# Patient Record
Sex: Female | Born: 1945
Health system: Southern US, Community
[De-identification: ages and names within clinical notes are randomized; demographics above are authoritative.]

## PROBLEM LIST (undated history)

## (undated) DIAGNOSIS — R609 Edema, unspecified: Secondary | ICD-10-CM

## (undated) DIAGNOSIS — M359 Systemic involvement of connective tissue, unspecified: Secondary | ICD-10-CM

## (undated) DIAGNOSIS — F32A Depression, unspecified: Secondary | ICD-10-CM

## (undated) DIAGNOSIS — C801 Malignant (primary) neoplasm, unspecified: Secondary | ICD-10-CM

## (undated) DIAGNOSIS — F419 Anxiety disorder, unspecified: Secondary | ICD-10-CM

## (undated) DIAGNOSIS — T8859XA Other complications of anesthesia, initial encounter: Secondary | ICD-10-CM

## (undated) DIAGNOSIS — Z87442 Personal history of urinary calculi: Secondary | ICD-10-CM

## (undated) DIAGNOSIS — J439 Emphysema, unspecified: Secondary | ICD-10-CM

## (undated) DIAGNOSIS — N63 Unspecified lump in unspecified breast: Secondary | ICD-10-CM

## (undated) DIAGNOSIS — T4145XA Adverse effect of unspecified anesthetic, initial encounter: Secondary | ICD-10-CM

## (undated) DIAGNOSIS — R002 Palpitations: Secondary | ICD-10-CM

## (undated) DIAGNOSIS — M069 Rheumatoid arthritis, unspecified: Secondary | ICD-10-CM

## (undated) DIAGNOSIS — Z9889 Other specified postprocedural states: Secondary | ICD-10-CM

## (undated) DIAGNOSIS — K219 Gastro-esophageal reflux disease without esophagitis: Secondary | ICD-10-CM

## (undated) DIAGNOSIS — M1990 Unspecified osteoarthritis, unspecified site: Secondary | ICD-10-CM

## (undated) DIAGNOSIS — E039 Hypothyroidism, unspecified: Secondary | ICD-10-CM

## (undated) DIAGNOSIS — M797 Fibromyalgia: Secondary | ICD-10-CM

## (undated) DIAGNOSIS — F329 Major depressive disorder, single episode, unspecified: Secondary | ICD-10-CM

## (undated) DIAGNOSIS — IMO0002 Reserved for concepts with insufficient information to code with codable children: Secondary | ICD-10-CM

## (undated) DIAGNOSIS — Z8719 Personal history of other diseases of the digestive system: Secondary | ICD-10-CM

## (undated) DIAGNOSIS — R112 Nausea with vomiting, unspecified: Secondary | ICD-10-CM

## (undated) DIAGNOSIS — H269 Unspecified cataract: Secondary | ICD-10-CM

## (undated) DIAGNOSIS — Z9221 Personal history of antineoplastic chemotherapy: Secondary | ICD-10-CM

## (undated) DIAGNOSIS — C50919 Malignant neoplasm of unspecified site of unspecified female breast: Secondary | ICD-10-CM

## (undated) DIAGNOSIS — H3530 Unspecified macular degeneration: Secondary | ICD-10-CM

## (undated) DIAGNOSIS — R0600 Dyspnea, unspecified: Secondary | ICD-10-CM

## (undated) DIAGNOSIS — H9190 Unspecified hearing loss, unspecified ear: Secondary | ICD-10-CM

## (undated) HISTORY — DX: Rheumatoid arthritis, unspecified: M06.9

## (undated) HISTORY — DX: Reserved for concepts with insufficient information to code with codable children: IMO0002

## (undated) HISTORY — DX: Depression, unspecified: F32.A

## (undated) HISTORY — DX: Hypothyroidism, unspecified: E03.9

## (undated) HISTORY — DX: Unspecified macular degeneration: H35.30

## (undated) HISTORY — DX: Malignant (primary) neoplasm, unspecified: C80.1

## (undated) HISTORY — DX: Unspecified osteoarthritis, unspecified site: M19.90

## (undated) HISTORY — PX: HIP ARTHROPLASTY: SHX981

## (undated) HISTORY — DX: Unspecified cataract: H26.9

## (undated) HISTORY — DX: Major depressive disorder, single episode, unspecified: F32.9

---

## 1968-10-19 HISTORY — PX: TONSILLECTOMY: SUR1361

## 1995-10-20 HISTORY — PX: LITHOTRIPSY: SUR834

## 1998-02-28 ENCOUNTER — Other Ambulatory Visit: Admission: RE | Admit: 1998-02-28 | Discharge: 1998-02-28 | Payer: Self-pay | Admitting: *Deleted

## 1998-03-04 ENCOUNTER — Other Ambulatory Visit: Admission: RE | Admit: 1998-03-04 | Discharge: 1998-03-04 | Payer: Self-pay | Admitting: Urology

## 1998-04-18 ENCOUNTER — Other Ambulatory Visit: Admission: RE | Admit: 1998-04-18 | Discharge: 1998-04-18 | Payer: Self-pay | Admitting: Urology

## 1999-10-10 ENCOUNTER — Encounter: Admission: RE | Admit: 1999-10-10 | Discharge: 1999-10-10 | Payer: Self-pay | Admitting: *Deleted

## 2000-06-18 ENCOUNTER — Other Ambulatory Visit: Admission: RE | Admit: 2000-06-18 | Discharge: 2000-06-18 | Payer: Self-pay | Admitting: *Deleted

## 2001-10-19 HISTORY — PX: JOINT REPLACEMENT: SHX530

## 2002-01-23 ENCOUNTER — Other Ambulatory Visit: Admission: RE | Admit: 2002-01-23 | Discharge: 2002-01-23 | Payer: Self-pay | Admitting: Family Medicine

## 2002-06-26 ENCOUNTER — Encounter: Payer: Self-pay | Admitting: Orthopedic Surgery

## 2002-06-26 ENCOUNTER — Inpatient Hospital Stay (HOSPITAL_COMMUNITY): Admission: RE | Admit: 2002-06-26 | Discharge: 2002-06-29 | Payer: Self-pay | Admitting: Orthopedic Surgery

## 2003-07-06 ENCOUNTER — Other Ambulatory Visit: Admission: RE | Admit: 2003-07-06 | Discharge: 2003-07-06 | Payer: Self-pay | Admitting: Family Medicine

## 2005-06-04 ENCOUNTER — Ambulatory Visit: Payer: Self-pay | Admitting: Family Medicine

## 2006-04-08 ENCOUNTER — Ambulatory Visit (HOSPITAL_COMMUNITY): Admission: RE | Admit: 2006-04-08 | Discharge: 2006-04-08 | Payer: Self-pay | Admitting: Rheumatology

## 2006-07-21 ENCOUNTER — Ambulatory Visit: Payer: Self-pay | Admitting: Internal Medicine

## 2006-11-03 ENCOUNTER — Encounter: Payer: Self-pay | Admitting: Family Medicine

## 2006-11-03 ENCOUNTER — Other Ambulatory Visit: Admission: RE | Admit: 2006-11-03 | Discharge: 2006-11-03 | Payer: Self-pay | Admitting: Family Medicine

## 2006-11-03 ENCOUNTER — Ambulatory Visit: Payer: Self-pay | Admitting: Family Medicine

## 2006-11-03 LAB — CONVERTED CEMR LAB: Pap Smear: NORMAL

## 2007-02-16 ENCOUNTER — Encounter: Payer: Self-pay | Admitting: Family Medicine

## 2007-02-16 LAB — CONVERTED CEMR LAB: TSH: 0.435 microintl units/mL

## 2007-02-21 ENCOUNTER — Encounter: Payer: Self-pay | Admitting: Family Medicine

## 2007-03-21 ENCOUNTER — Ambulatory Visit (HOSPITAL_COMMUNITY): Admission: RE | Admit: 2007-03-21 | Discharge: 2007-03-21 | Payer: Self-pay | Admitting: Urology

## 2007-12-28 ENCOUNTER — Encounter: Payer: Self-pay | Admitting: Family Medicine

## 2007-12-28 DIAGNOSIS — H9190 Unspecified hearing loss, unspecified ear: Secondary | ICD-10-CM | POA: Insufficient documentation

## 2007-12-28 DIAGNOSIS — M810 Age-related osteoporosis without current pathological fracture: Secondary | ICD-10-CM

## 2007-12-28 DIAGNOSIS — M797 Fibromyalgia: Secondary | ICD-10-CM

## 2007-12-28 DIAGNOSIS — M0579 Rheumatoid arthritis with rheumatoid factor of multiple sites without organ or systems involvement: Secondary | ICD-10-CM

## 2007-12-28 DIAGNOSIS — M5136 Other intervertebral disc degeneration, lumbar region: Secondary | ICD-10-CM | POA: Insufficient documentation

## 2007-12-28 DIAGNOSIS — E039 Hypothyroidism, unspecified: Secondary | ICD-10-CM

## 2007-12-28 DIAGNOSIS — Z87898 Personal history of other specified conditions: Secondary | ICD-10-CM | POA: Insufficient documentation

## 2007-12-28 DIAGNOSIS — E559 Vitamin D deficiency, unspecified: Secondary | ICD-10-CM

## 2007-12-29 ENCOUNTER — Encounter: Payer: Self-pay | Admitting: Family Medicine

## 2007-12-29 ENCOUNTER — Ambulatory Visit: Payer: Self-pay | Admitting: Family Medicine

## 2007-12-29 DIAGNOSIS — M4800 Spinal stenosis, site unspecified: Secondary | ICD-10-CM | POA: Insufficient documentation

## 2008-01-24 ENCOUNTER — Encounter: Payer: Self-pay | Admitting: Family Medicine

## 2008-02-06 ENCOUNTER — Telehealth: Payer: Self-pay | Admitting: Family Medicine

## 2008-03-13 ENCOUNTER — Encounter: Payer: Self-pay | Admitting: Family Medicine

## 2008-04-26 ENCOUNTER — Telehealth: Payer: Self-pay | Admitting: Family Medicine

## 2008-07-18 ENCOUNTER — Encounter: Payer: Self-pay | Admitting: Family Medicine

## 2008-08-31 ENCOUNTER — Encounter: Payer: Self-pay | Admitting: Family Medicine

## 2008-09-11 ENCOUNTER — Telehealth: Payer: Self-pay | Admitting: Family Medicine

## 2008-10-24 ENCOUNTER — Ambulatory Visit: Payer: Self-pay | Admitting: Family Medicine

## 2008-10-28 DIAGNOSIS — F341 Dysthymic disorder: Secondary | ICD-10-CM

## 2008-11-28 ENCOUNTER — Encounter: Payer: Self-pay | Admitting: Family Medicine

## 2008-12-12 ENCOUNTER — Encounter: Payer: Self-pay | Admitting: Family Medicine

## 2008-12-18 ENCOUNTER — Telehealth: Payer: Self-pay | Admitting: Family Medicine

## 2009-03-07 ENCOUNTER — Encounter: Payer: Self-pay | Admitting: Family Medicine

## 2009-04-24 ENCOUNTER — Encounter: Payer: Self-pay | Admitting: Family Medicine

## 2009-05-02 ENCOUNTER — Ambulatory Visit (HOSPITAL_COMMUNITY): Admission: RE | Admit: 2009-05-02 | Discharge: 2009-05-02 | Payer: Self-pay | Admitting: Rheumatology

## 2009-05-02 ENCOUNTER — Encounter: Payer: Self-pay | Admitting: Family Medicine

## 2009-05-13 ENCOUNTER — Encounter: Payer: Self-pay | Admitting: Family Medicine

## 2009-05-29 ENCOUNTER — Encounter: Payer: Self-pay | Admitting: Family Medicine

## 2009-06-04 ENCOUNTER — Ambulatory Visit: Payer: Self-pay | Admitting: Family Medicine

## 2009-07-18 ENCOUNTER — Encounter: Payer: Self-pay | Admitting: Family Medicine

## 2009-07-30 ENCOUNTER — Encounter: Payer: Self-pay | Admitting: Family Medicine

## 2009-09-02 ENCOUNTER — Telehealth: Payer: Self-pay | Admitting: Family Medicine

## 2009-09-05 ENCOUNTER — Encounter: Payer: Self-pay | Admitting: Family Medicine

## 2010-01-09 ENCOUNTER — Encounter: Payer: Self-pay | Admitting: Family Medicine

## 2010-05-09 ENCOUNTER — Ambulatory Visit: Payer: Self-pay | Admitting: Family Medicine

## 2010-05-09 DIAGNOSIS — IMO0001 Reserved for inherently not codable concepts without codable children: Secondary | ICD-10-CM | POA: Insufficient documentation

## 2010-05-13 ENCOUNTER — Encounter: Payer: Self-pay | Admitting: Family Medicine

## 2010-05-28 ENCOUNTER — Encounter: Payer: Self-pay | Admitting: Family Medicine

## 2010-06-05 ENCOUNTER — Encounter: Payer: Self-pay | Admitting: Family Medicine

## 2010-06-09 ENCOUNTER — Ambulatory Visit: Payer: Self-pay | Admitting: Family Medicine

## 2010-06-09 DIAGNOSIS — J209 Acute bronchitis, unspecified: Secondary | ICD-10-CM

## 2010-11-13 ENCOUNTER — Encounter: Payer: Self-pay | Admitting: Family Medicine

## 2010-11-18 NOTE — Letter (Signed)
Summary: [facility]  [facility]   Imported By: Lanelle Bal 01/18/2010 09:54:36  _____________________________________________________________________  External Attachment:    Type:   Image     Comment:   External Document

## 2010-11-18 NOTE — Miscellaneous (Signed)
Summary: CONTROLLED SUBSTANCES CONTRACT  CONTRO[employer]ED [employer]ES CONTRACT   Imported By: Wilder Glade 05/14/2010 16:41:22  _____________________________________________________________________  External Attachment:    Type:   Image     Comment:   External Document

## 2010-11-18 NOTE — Assessment & Plan Note (Signed)
Summary: MED NEEDS REFILL/RI   Vital Signs:  Patient profile:   65 year old female Height:      64 inches Weight:      186 pounds BMI:     32.04 Temp:     98 degrees F oral Pulse rate:   84 / minute Pulse rhythm:   regular BP sitting:   118 / 82  (left arm) Cuff size:   regular  Vitals Entered By: Lewanda Rife LPN (May 09, 2010 2:55 PM) CC: needs med refills   History of Present Illness: here for f/u of hypothyroid and depression   wt is down 3 lb with bmi of 32  bp is good   on lower prozac dose - is happier and sleeping better and no longer needs the ativan  is much more rested  does not skip doses   thyroid seems fine - no changes in that - happy with that  due to check tsh at work next month ( with her methotrexate labs)  her RA is slowly progressing and meds help  she is tolerating it  did cut her hours at work -- 7 hour day is beginning to think about appl for disability -- for joint pain and neck pain and fatigue   still uses flexeril  that works pretty well uses traction on her neck   has a big whelp on R arm after mosquito      Allergies: 1)  ! * Actonel 2)  ! Plaquenil 3)  ! Prozac 4)  ! 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 Eyes:  Denies blurring and eye irritation. CV:  Denies chest pain or discomfort, palpitations, shortness of breath with exertion, and swelling of feet. Resp:  Denies cough, shortness of breath, and wheezing. GI:  Denies abdominal pain, indigestion, and nausea. MS:  Complains of joint pain, joint redness, joint swelling, muscle aches, and stiffness; denies muscle weakness. Derm:  Complains of insect bite(s), itching, and rash; denies lesion(s) and poor wound healing. Neuro:  Denies numbness, tingling, and tremors. Psych:  Denies anxiety and depression. Endo:  Denies cold intolerance, excessive thirst, excessive urination, and heat intolerance. Heme:  Denies abnormal bruising and bleeding.  Physical Exam  General:  overweight but generally well appearing  Head:  normocephalic, atraumatic, and no abnormalities observed.   Eyes:  vision grossly intact, pupils equal, pupils round, and pupils reactive to light.  no conjunctival pallor, injection or icterus  Mouth:  pharynx pink and moist.   Neck:  supple with full rom and no masses or thyromegally, no JVD or carotid bruit  Lungs:  Normal respiratory effort, chest expands symmetrically. Lungs are clear to auscultation, no crackles or wheezes. Heart:  Normal rate and regular rhythm. S1 and S2 normal without gallop, murmur, click, rub or other extra sounds. Abdomen:  soft and non-tender.  no renal bruits  Msk:  No deformity  or scoliosis noted of thoracic or lumbar spine.  no new joint changes stiffness in knees and fingers Pulses:  R and L carotid,radial,femoral,dorsalis pedis and posterior tibial pulses are full and equal bilaterally Extremities:  No clubbing, cyanosis, edema, or deformity noted with normal full range of motion of all joints.   Neurologic:  sensation intact to light touch, gait normal, and DTRs symmetrical and normal.   Skin:  R arm large whelp - with uniform induration and pink  color marked with pen Cervical Nodes:  No lymphadenopathy noted Psych:  normal affect, talkative and pleasant - is quite cheerful today   Impression & Recommendations:  Problem # 1:  HYPOTHYROIDISM (ICD-244.9) Assessment Improved  no clinical changes will be getting lab at work in a month and will foward no change in dose at this time Her updated medication list for this problem includes:    Synthroid 112 Mcg Tabs (Levothyroxine sodium) .Marland Kitchen... 1 by mouth once daily  Labs Reviewed: TSH: 0.435 (02/16/2007)     Orders: Prescription Created Electronically [id number])  Problem # 2:  ANXIETY DEPRESSION (ICD-300.4) Assessment: Improved  this is much imp on low dose prozac  will continue this and activity as tolerated  coping skills are good  Orders: Prescription Created Electronically [phone])  Problem # 3:  RHEUMATOID ARTHRITIS (ICD-714.0) Assessment: Deteriorated per pt fairly slowly progressing -may need to go on disability soon - will keep me updated  Her updated medication list for this problem includes:    Enbrel 50 Mg/ml Soln (Etanercept) ..... Inject once a week    Celebrex 200 Mg Caps (Celecoxib) .Marland Kitchen... 1 capsule by mouth twice a day    Methotrexate 2.5 Mg Tabs (Methotrexate sodium) .Marland KitchenMarland KitchenMarland KitchenMarland Kitchen 7 pills by mouth one time weekly    Adult Aspirin Low Strength 81 Mg Tbdp (Aspirin) .Marland Kitchen... Take one by mouth daily  Problem # 4:  INSECT BITE, LOWER ARM (ICD-913.4) Assessment: New this looks allergic but given hx of immunocomp meds - much watch closely for infection drew line around whelp and pt will start doxycycline she has if bigger or pain or fever and update me   Complete Medication List: 1)  Folic Acid 1 Mg Tabs (Folic acid) .... Take 1 tablet by mouth once a day 2)  Enbrel 50 Mg/ml Soln (Etanercept) .... Inject once a week 3)  Synthroid 112 Mcg Tabs (Levothyroxine sodium) .Marland Kitchen.. 1 by mouth once daily 4)  Flexeril 10 Mg Tabs (Cyclobenzaprine hcl) .... Take1/2 to 1 by mouth three  times a day as needed muscle spasm 5)  Celebrex 200 Mg Caps (Celecoxib) .Marland Kitchen.. 1 capsule by mouth twice a day 6)  Methotrexate 2.5 Mg Tabs (Methotrexate sodium) .... 7 pills by mouth one time weekly 7)  Adult Aspirin Low Strength 81 Mg Tbdp (Aspirin) .... Take one by mouth daily 8)  Prozac 10 Mg Caps (Fluoxetine hcl) .Marland Kitchen.. 1 by mouth once daily  Patient Instructions: 1)  no change in medicines 2)  get labs at work as planned 3)  watch the area on your arm -if pain or fever or if the redness exceeds the lines drawn -- then start the doxycycline and let me know  4)  I will update you further when I get lab report  Prescriptions: PROZAC 10 MG CAPS (FLUOXETINE HCL) 1 by mouth once daily  #30 x 11   Entered and Authorized by:   Judith Part MD   Signed by:   Judith Part MD on 05/09/2010   Method  used:   Electronically to        [facility] [address] (retail)       [address], [address]       [place], [place]  [address]       Ph: [phone]       Fax: [phone]   RxID:   [id number]   Current Allergies (reviewed today): ! * ACTONEL ! PLAQUENIL ! PROZAC ! BONIVA (IBANDRONATE SODIUM)

## 2010-11-18 NOTE — Letter (Signed)
Summary: [facility]   Imported By: Maryln Gottron 05/19/2010 14:48:00  _____________________________________________________________________  External Attachment:    Type:   Image     Comment:   External Document

## 2010-11-18 NOTE — Assessment & Plan Note (Signed)
Summary: CHEST CONGESTION / LFW   Vital Signs:  Patient profile:   65 year old female Height:      64 inches Weight:      188.25 pounds BMI:     32.43 Temp:     98.3 degrees F oral Pulse rate:   80 / minute Pulse rhythm:   regular Resp:     20 per minute BP sitting:   120 / 80  (left arm) Cuff size:   regular  Vitals Entered By: Lewanda Rife LPN (June 09, 2010 3:03 PM) CC: chest congestion with non productive cough and slight head congestion.   History of Present Illness: chest congestion since last tuesday  is mostly non prod (though taking guifen)-- the little bit she can move is dark green a little nasal cong- is clear  this is 4-5th time she has had this -- but usually gets over it quickly is on immunosuppressive drugs is worn out  no fever   ? if rales on exam from nurse at her office   did smoke in her youth   no st  occ a sharp ear pain  no n/v/d     Allergies: 1)  ! * Actonel 2)  ! Plaquenil 3)  ! Prozac 4)  ! 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 Eyes:  Denies blurring, discharge, and eye irritation. ENT:  Complains of earache and nasal congestion; denies sinus pressure and sore throat. CV:  Denies chest pain or discomfort and palpitations. Resp:  Complains of cough and sputum productive; denies pleuritic, shortness of breath, and wheezing. GI:  Denies diarrhea, nausea, and vomiting. Derm:  Denies lesion(s) and rash.  Physical Exam  General:  overweight but generally well appearing  Head:  normocephalic, atraumatic, and no abnormalities observed.  no sinus tenderness  Eyes:  vision grossly intact, pupils equal, pupils round, and pupils reactive to light. vision grossly intact, pupils equal, pupils round, pupils reactive to light, and no injection.   Ears:  R ear normal and L ear normal.   Nose:  nares are boggy but clear  Mouth:  pharynx pink and moist, no erythema, and no exudates.   Neck:  supple with full rom and no masses or thyromegally, no JVD or carotid bruit  Chest Wall:  No deformities, masses, or tenderness noted. Lungs:  few dry crackles at each base no rales or rhonchi bs are diffusely harsh and tubular good air exch no wheeze even on forced exp Heart:  Normal rate and regular rhythm. S1 and S2 normal without gallop, murmur, click, rub or other extra sounds. Abdomen:  Bowel sounds positive,abdomen soft and non-tender without masses, organomegaly or hernias noted. Skin:  Intact without suspicious  lesions or rashes Cervical Nodes:  No lymphadenopathy noted Psych:  normal affect, talkative and pleasant    Impression & Recommendations:  Problem # 1:  ACUTE BRONCHITIS (ICD-466.0) Assessment New in pt who is immunocomp (also on mtx which can cause lung toxicity) cxr today begin zpak recommend sympt care- see pt instructions   pt advised to update me if symptoms worsen or do not improve  Her updated medication list for this problem includes:    Zithromax Z-pak 250  Mg Tabs (Azithromycin) .Marland Kitchen... Take by mouth as directed  Orders: T-2 View CXR ([id number]) Prescription Created Electronically ([id number])  Complete Medication List: 1)  Folic Acid 1 Mg Tabs (Folic acid) .... Take 1 tablet by mouth once a day 2)  Enbrel 50 Mg/ml Soln (Etanercept) .... Inject once a week 3)  Synthroid 112 Mcg Tabs (Levothyroxine sodium) .Marland Kitchen.. 1 by mouth once daily 4)  Flexeril 10 Mg Tabs (Cyclobenzaprine hcl) .... Take1/2 to 1 by mouth three times a day as needed muscle spasm 5)  Celebrex 200 Mg Caps (Celecoxib) .Marland Kitchen.. 1 capsule by mouth twice a day 6)  Methotrexate 2.5 Mg Tabs (Methotrexate sodium) .... 7 pills by mouth one time weekly 7)  Adult Aspirin Low Strength 81 Mg Tbdp (Aspirin) .... Take one by mouth daily 8)  Prozac 10 Mg Caps (Fluoxetine hcl) .Marland Kitchen.. 1 by mouth once daily 9)  Zithromax Z-pak 250 Mg Tabs (Azithromycin) .... Take by mouth as directed  Patient Instructions: 1)  you can try mucinex over the counter twice daily as directed and nasal saline spray for congestion 2)  tylenol over the counter as directed may help with aches, headache and fever 3)  call if symptoms worsen or if not improved in 4-5 days  4)  take the zithromax as directed 5)  chest x ray today  6)  if you get worse - update me right away  Prescriptions: ZITHROMAX Z-PAK 250 MG TABS (AZITHROMYCIN) take by mouth as directed  #1 pack x 0   Entered and Authorized by:   Judith Part MD   Signed by:   Judith Part MD on 06/09/2010   Method used:   Electronically to        [facility] (retail)       [address], [address]       [place], [place]  [address]       Ph: [phone]       Fax: [phone]   RxID:   [id number]   Current Allergies (reviewed today): ! * ACTONEL ! PLAQUENIL ! PROZAC ! BONIVA (IBANDRONATE SODIUM)

## 2010-11-18 NOTE — Miscellaneous (Signed)
Summary: PPD,[facility]  PPD,[facility]   Imported By: Beau Fanny 06/10/2010 14:25:02  _____________________________________________________________________  External Attachment:    Type:   Image     Comment:   External Document

## 2010-12-04 NOTE — Letter (Signed)
Summary: [facility]  [facility]   Imported By: Kassie Mends 11/25/2010 09:26:42  _____________________________________________________________________  External Attachment:    Type:   Image     Comment:   External Document

## 2011-03-06 NOTE — H&P (Signed)
NAME:  Samantha Valentine, Samantha Valentine NO.:  [id number]   MEDICAL RECORD NO.:  [id number]                   PATIENT TYPE:   LOCATION:                                       FACILITY:   PHYSICIAN:  [id number]                                DATE OF BIRTH:  05-25-1946   DATE OF ADMISSION:  06/26/2002  DATE OF DISCHARGE:                                HISTORY & PHYSICAL   CHIEF COMPLAINT:  Right hip pain.   HISTORY OF PRESENT ILLNESS:  The patient is a 65 year-old white female with  severe hip pain for the last five years.  The patient has had difficulty and  discomfort in her right hip over the last 20 years.  The pain is presently  located over the lateral aspect of the thigh and around into the groin.  She  does have severe night pain causing difficulty to sleep.  She does have  difficulty sitting in the car, going up and down stairs, ambulating  distances and difficulty putting her socks on and off.  She describes the  pain as a sharp, stabbing, catching sensation with awkward movements.  Other  than that it is a constant aching sensation.  The pain does not radiate  anywhere.  X-rays reveal rheumatoid arthritis with significant decreased  joint space and significant spurs.   ALLERGIES:  PLAQUENIL and ACTONEL.   CURRENT MEDICATIONS:  1. Enbrel one injection on Mondays and Thursdays discontinued prior to     surgery.  2. Methotrexate 2.5 mg tablets, seven tablets q. week, discontinued before     surgery.  3. Celebrex 200 mg p.o. b.i.d.  4. Folic acid 1 mg p.o. q.d.  5. Synthroid 150 mcg p.o. q.d.  6. Prozac 20 mg p.o. q.d.  7. Fosamax 70 mg p.o. q. week on Sunday.  8. Flexeril 10 mg p.o. q.h.s.  9. Calcium with vitamin D one tablet p.o. q.d.  10.      Ativan 0.5 mg p.o. p.r.n.  11.      Ultracet p.r.n.  12.      Tylenol #3 p.r.n.  13.      Lotemax one drop q.i.d. OS.  14.      Vigamox one drop q.i.d. OS.   PAST MEDICAL HISTORY:  1. Rheumatoid arthritis.  2.  Thyroid disease.  3. Depression/anxiety.  4. Keratitis OU.  5. Fibromyalgia.   PAST SURGICAL HISTORY:  1. Tonsillectomy and adenoidectomy in 1973.  2. Lithotripsy in 1996; the patient denies any complications.   SOCIAL HISTORY:  The patient is a 65 year-old healthy appearing white  female.  She denies any history of smoking or alcohol use.  She is currently  divorced.  She has one child.  She lives in a one story house.  She is  employed as a  medical records manager.   PHYSICIAN:  Marne A. Milinda Antis, M.D. [facility] at [facility], [phone].   FAMILY MEDICAL HISTORY:  Mother is alive and has a history of osteoarthritis  and hypertension. Father is deceased from stroke.  The patient has two  brothers and two sisters, all alive and in good medical health.   REVIEW OF SYMPTOMS:  GENERAL:  Positive for neck pain related to her  rheumatoid arthritis.  She does use glasses for vision.  She does wear  bilateral hearing aids.  She denies any other significant problem within the  categories of the review of systems.   PHYSICAL EXAMINATION:  VITAL SIGNS:  Height is 5'4, weight 172 pounds,  pulse 88 and regular, respirations 12, temperature 98.6, blood pressure  154/98.  GENERAL:  This is a healthy appearing, well developed, very energetic up  female who ambulates with a very slight right sided limp.  She is able to  get herself on and off the exam table without much difficulty.  HEENT:  Head was normocephalic, atraumatic, non-tender over maxillary and  frontal sinuses.  Pupils equal, round and reactive to light and  accommodation.  Extraocular movements are intact.  Sclerae are not icteric.  Conjunctivae are pink and moist.  External ears are without deformities.  She only has a left hearing aid in place at this time.  TMs  were pearly  gray.  Gross hearing was significantly decreased.  The nasal septum was  deviated to the right.  Mucous membranes are pink and moist.  No polyps.  Oral  buccal mucosa was pink and moist without lesions.  Dentition was in  good repair.  Uvula was midline.  The patient is able to swallow without any  difficulty.  Neck is supple with no palpable lymphadenopathy.  Thyroid gland  was non-tender.  The patient did have some tenderness with right and left  rotation, but it was full.  She was non-tender to percussion along the  spinal column.  CHEST:  Lung sounds were clear and equal bilaterally.  No wheezes, rales,  rhonchi or rubs noted.  HEART:  Regular rate and rhythm, S1 and S2 without significant murmurs, rubs  or gallops noted.  ABDOMEN: Round, soft and nontender.  Bowel sounds were normoactive.  No  hepatosplenomegaly. Costovertebral angle was non-tender to percussion.  EXTREMITIES:  The upper extremities were symmetrically sized and shaped.  She had good range of motion of her shoulders, elbows and wrists without any  difficulty or tenderness.  Motor strength was 5/5.  Lower extremities:  The left hip had full extension, flexion up to 130 with  20 degrees of internal and external rotation without any difficulty.  The  right hip had full extension, flexion up to 110 degrees, 0 degrees internal  rotation and about 15 degrees external rotation limited by mechanical and  discomfort.  The bilateral knees were symmetrically sized and shaped. She  has excellent range of motion without any difficulty or tenderness.  No  erythema or ecchymosis about either knee.  No effusions.  No significant  crepitus.  No calf tenderness.  The bilateral ankles are symmetrical with  good dorsi and plantar flexion.  PERIPHERAL VASCULAR:  Carotid pulses were 2+ with no bruits.  Radial pulses  are 2+.  Femoral pulses are not palpated. Dorsalis pedis and posterior  tibial pulses were 1+ with no lower extremity edema or venous stasis changes  noted.  NEUROLOGIC:  The patient was conscious, alert and appropriate on  exam. Cranial nerves II-XII were grossly intact.   Deep tendon reflexes are intact.  The extremities are symmetrical right to left.  The patient was grossly  intact to light touch and sensation from head to toe.  BREAST, RECTAL AND GU EXAMS:  Deferred at this time.   IMPRESSION:  1. Severe rheumatoid arthritis of the right hip.  2. Rheumatoid arthritis.  3. Thyroid disease.  4. Depression/anxiety.  5. Keratitis - OU.  6. Fibromyalgia.   PLAN:  The patient will be admitted to [facility] under the care of  Dr. Georgena Spurling on  June 26, 2002. The patient will undergo all  routine laboratories prior to undergoing a right total hip arthroplasty.     Jamelle Rushing, P.A.                      2168    RWK/MEDQ  D:  06/20/2002  T:  06/21/2002  Job:  [id number]

## 2011-03-06 NOTE — Op Note (Signed)
NAMESHIRELY, TOREN                           ACCOUNT NO.:  [id number]   MEDICAL RECORD NO.:  [id number]                   PATIENT TYPE:  INP   LOCATION:  5038                                 FACILITY:  [facility]   PHYSICIAN:  Mila Homer. Sherlean Foot, M.D.              DATE OF BIRTH:  [birth year]   DATE OF PROCEDURE:  06/26/2002  DATE OF DISCHARGE:                                 OPERATIVE REPORT   PREOPERATIVE DIAGNOSES:  Right hip osteoarthritis.   POSTOPERATIVE DIAGNOSES:  Right hip osteoarthritis.   OPERATION PERFORMED:  Right total hip arthroplasty.   SURGEON:  Mila Homer. Sherlean Foot, M.D.   ASSISTANT:  Jamelle Rushing, P.A.   ANESTHESIA:  General.   INDICATIONS FOR PROCEDURE:  The patient is a 65 year old white female status  post failure of conservative measures for osteoarthritis of the knee.   DESCRIPTION OF PROCEDURE:  The patient was laid supine and administered  general endotracheal anesthesia.  Foley catheter placed.  She was then  placed in the right up left down decubitus position.  The right hip was  prepped and draped in the usual sterile fashion.  A #10 blade was used to  make a small minimally invasive incision approximately four inches in  length.  Sharp dissection was continued down to the fatty tissue to the  fascia lata.  Bleeding vessels were cauterized.  Self-retaining retractor  was put in place.  Fascia lata was incised along the length of the incision.  The sleeve was developed over the anterior half of the gluteus medius, the  minimus and the vastus lateralis.  These were retracted with three stay  sutures.  The anterior hip capsule was excised.  The hip was then dislocated  and the template was used and the femoral neck was cut with a sagittal saw.  I then placed the leg in internal rotation, placed a Hohmann retractor  anterior and posterior, removed the labrum circumferentially, placed a wing  retractor to retract the abductors and superior musculature.  I then  reamed  from 48 to 52 tamped down, no hole, no spike, fiber mesh coated 54 mm  diameter cup.  At this point I brought the leg over into flexion off the  bed, using a canal finder to find the femoral canal, used the lateralizing  reamer to ream laterally and then sequentially reamed up to a size 13  reamer.  I then broached from 11 to 12 to 13 and at that point, had  excellent stability, used a standard neck with a -35 head and reduced the  hip.  I tried a 0 as well and that increased her leg length quite a bit.  It  was very stable in extremes of motion.  I then removed the trials, placed  down a 13 mm _________ porous coated stem in approximately 10 degrees of  anteversion, tamped on a 32 mm diameter head  which was -35, placed in a 7 mm  offset __________ cross link polyethylene liner and reduced the hip.  We  took it through extreme range of motion and it was very stable.  We repaired  the sleeve of medius minimus and vastus lateralis through drill holes and  trochanter.  Then repaired the vastus lateralis and tensor fascia lata with  interrupted #1 Vicryl sutures, deep soft tissues with interrupted 0 Vicryl  sutures, subcuticular 2-0 Vicryl suture, and skin staples.  We dressed with  Adaptic, 4 x 4s, ABD, and sterile Ioban drape.  The estimated blood loss for  this procedure was  300 cc.    COMPLICATIONS:  None.   DRAINS:  None.                                                 Mila Homer. Sherlean Foot, M.D.    SDL/MEDQ  D:  06/26/2002  T:  06/26/2002  Job:  [id number]

## 2011-03-06 NOTE — Discharge Summary (Signed)
Samantha Valentine, Samantha Valentine                           ACCOUNT NO.:  [id number]   MEDICAL RECORD NO.:  [id number]                   PATIENT TYPE:  INP   LOCATION:  5038                                 FACILITY:  [facility]   PHYSICIAN:  Mila Homer. Sherlean Foot, M.D.              DATE OF BIRTH:  1946/05/28   DATE OF ADMISSION:  06/26/2002  DATE OF DISCHARGE:  06/29/2002                                 DISCHARGE SUMMARY   ADMISSION DIAGNOSES:  1. Severe rheumatoid arthritis, right hip.  2. Thyroid disease.  3. Depression and anxiety.  4. Ocular keratitis, both eyes.  5. Fibromyalgia.   DISCHARGE DIAGNOSES:  1. Right total hip arthroplasty.  2. Thyroid disease.  3. Depression and anxiety.  4. Ocular keratitis, both eyes.  5. Fibromyalgia.   HISTORY OF PRESENT ILLNESS:  The patient is a 65 year old white female with  a history of rheumatoid arthritis.  The patient has complained of right hip  pain for about 20 years that has become significantly severe over the last  five years.  She has severe pain with a catching sensation in the lateral  thigh into her groin with awkward movements.  Otherwise she has aching  sensation diffusely about the hip.  She has extreme difficulty with sitting  and stairs.  She does have night pain and pain with ambulating any distance.   ALLERGIES:  PLAQUENIL, ACTONEL.   CURRENT MEDICATIONS:  1. Celebrex 200 mg p.o. b.i.d.  2. Folic acid 1 mg p.o. q.d.  3. Synthroid 150 mg p.o. q.d.  4. Prozac 20 mg p.o. q.d.  5. Fosamax 70 mg p.o. weekly on Sunday.  6. Flexeril 10 mg p.o. q.h.s.  7. Calcium plus D 1 tablet p.o. q.d.  8. Ativan 0.5 mg p.r.n.  9. Ultracet p.r.n.  10.      Tylenol No. 3 p.r.n.  11.      Lotemax q.i.d. OS.  12.      _________ q.i.d. OS  13.      Enbrel and methotrexate discontinued prior to surgery.   SURGICAL PROCEDURE:  On 06/26/2002, the patient was taken to the OR by Mila Homer. Sherlean Foot, M.D., assisted by Jamelle Rushing, P.A.-C, under general  anesthesia.  The patient underwent a right total hip arthroplasty.  The  patient tolerated the procedure well.  Estimated blood loss was 300 cc, and  there were no drains left in place and no complications. The patient was  transferred to the recovery room and then to the orthopedic floor in good  condition.   CONSULTATIONS:  The following routine consults were requested, physical  therapy, occupational therapy, case management.   HOSPITAL COURSE:  On 06/26/2002, the patient was taken to the OR by Mila Homer.  Lucey, M.D. and had a right total hip arthroplasty.  The patient tolerated  the procedure well. There were no complications, and the patient was  transferred  to the recovery room and then to the orthopedic floor for  routine postoperative care.  The patient then was placed on Lovenox for  routine DVT prophylaxis.   The patient then incurred a total of three days postoperative care on the  orthopedic floor in which the patient initially had some problems with some  nausea, but this resolved easily with antiemetics.  The patient also  developed some low-grade temperatures, but these were self relieved without  any symptoms.  Her vital signs otherwise remained stable.  He leg remained  neural, motor, and vascularly intact.  Wound remained benign for any signs  of infection.  The patient worked very well with physical therapy and was  felt to be ready from an orthopedic standpoint to be discharged home on  postop day #3.  Arrangements were made for outpatient physical therapy, and  the patient was discharged with all routine postop hip protocol.   LABORATORY DATA:  On 06/29/2002, WBC 4.6, hemoglobin 9.1, hematocrit 26.6,  platelets 198.  Routine chemistries on 06/28/2002, sodium 137,potassium 4.0,  glucose 130, BUN 6, creatinine 0.5.  Routine urinalysis on admission was  normal.   DISCHARGE MEDICATIONS:  1. Folic acid 1 mg p.o. q.d.  2. Synthroid 150 mcg p.o. q.d.  3. Prozac 20 mg  p.o. q.d.  4. Calcium carbonate plus vitamin D 1 tablet p.o. q.d.  5. Trinsicon 1 p.o. t.i.d.  6. Lovenox 30 mg subcutaneously q.12h.  7. Colace 100 mg p.o. b.i.d.  8. OxyContin CR 10 mg b.i.d.  9. Celebrex 200 mg p.o. b.i.d.  10.      Laxative of choice and enema of choice p.r.n.  11.      Ativan 0.5 mg p.o. q.8h. p.r.n.  12.      Phenergan 25 mg p.o. q.6h. p.r.n.  13.      Tylenol 650 mg p.o. q.4h. p.r.n.  14.      Robaxin 500 mg p.o. q.6h. p.r.n.  15.      Percocet 1 or 2 tablets every 4 to 6 hours p.r.n.   DISCHARGE INSTRUCTIONS:  1. Medications:  Lovenox 40 mg injection once a day for 10 days.  2. OxyContin CR 10 mg 1 tablet every 12 hours for 7 days, the 1 tablet at     sleep until gone.  3. Percocet 5 mg 1 to 2 tablets every 4 to 6 hours for pain if needed.  4. Restoril 30 mg 1 tablet at time of sleep if needed.  5. Activity as tolerated with use of walker, may shower.  6. Diet: No restrictions.  7. Wound care:  Keep wound clean.  Check daily for any signs of infection.     If any questions, call Dr. Tobin Chad     office.  8. Followup:  Call [phone] for followup appointment in two weeks.   CONDITION ON DISCHARGE:  Listed as good.       Jamelle Rushing, P.A.                      Mila Homer. Sherlean Foot, M.D.    RWK/MEDQ  D:  07/31/2002  T:  08/01/2002  Job:  [id number]

## 2011-03-31 ENCOUNTER — Other Ambulatory Visit: Payer: Self-pay | Admitting: Family Medicine

## 2011-03-31 NOTE — Telephone Encounter (Signed)
Px written for call in   

## 2011-03-31 NOTE — Telephone Encounter (Signed)
Medication phoned to [facility] [address] pharmacy as instructed.  

## 2011-06-01 ENCOUNTER — Other Ambulatory Visit: Payer: Self-pay | Admitting: Family Medicine

## 2011-06-02 NOTE — Telephone Encounter (Signed)
Will refill electronically Seeing pt soon

## 2011-06-02 NOTE — Telephone Encounter (Signed)
[facility] [address] electronically request refill on Prozac 10mg . I added to med list. Pt has appt to see Dr Milinda Antis on 07/14/11.Please advise.

## 2011-07-14 ENCOUNTER — Ambulatory Visit (INDEPENDENT_AMBULATORY_CARE_PROVIDER_SITE_OTHER): Payer: PRIVATE HEALTH INSURANCE | Admitting: Family Medicine

## 2011-07-14 ENCOUNTER — Encounter: Payer: Self-pay | Admitting: Family Medicine

## 2011-07-14 VITALS — BP 124/76 | HR 92 | Temp 98.0°F | Ht 64.0 in | Wt 177.8 lb

## 2011-07-14 DIAGNOSIS — E559 Vitamin D deficiency, unspecified: Secondary | ICD-10-CM

## 2011-07-14 DIAGNOSIS — E039 Hypothyroidism, unspecified: Secondary | ICD-10-CM

## 2011-07-14 NOTE — Assessment & Plan Note (Signed)
Vit D theraputic now Will stay on current dose

## 2011-07-14 NOTE — Patient Instructions (Signed)
Labs today for thyroid Keep current dose unless instructed otherwise  I'm glad you are sleeping better Stay active - good job with weight loss  Vit D level is ok

## 2011-07-14 NOTE — Progress Notes (Signed)
Subjective:    Patient ID: Samantha Valentine, female    DOB: [birth year], 65 y.o.   MRN: [id number]  HPI Pt here to disc hypothyroidism  Last tsh in aug was high- but pt had held her med for a while due to trouble sleeping and thought her dose was too high  In retrospect the sleep problem was from tramadol -- so went back to taking less and is sleeping better  Here to disc further  Wt is down 11 lb with bmi of 30 at this time  Currently on 112 mcg of levothroid   Has also had a long hx of shortness of breath and that has improved  Was sob on exertion for the most  Is more active now and doing better  No changes in skin and hair   Has retired and eating much less junk food - eating healthier  Is doing better overall  With the RA - decided to retire Is loving it  Stays very busy   Due for vit D level Has been compliant with that   Patient Active Problem List  Diagnoses  . HYPOTHYROIDISM  . VITAMIN D DEFICIENCY  . ANXIETY DEPRESSION  . HEARING LOSS  . RHEUMATOID ARTHRITIS  . DEGENERATIVE DISC DISEASE  . BACK PAIN  . FIBROMYALGIA  . OSTEOPOROSIS  . INSECT BITE, LOWER ARM  . MIGRAINES, HX OF   Past Medical History  Diagnosis Date  . Depression   . Hypothyroidism   . Osteoporosis   . Arthritis     RA  . DDD (degenerative disc disease)     in neck  . Migraine    Past Surgical History  Procedure Date  . Tonsillectomy   . Lithotripsy     kidney stone  . Joint replacement     total hip replacement   History  Substance Use Topics  . Smoking status: Former Smoker    Quit date: 10/20/1983  . Smokeless tobacco: Not on file  . Alcohol Use: Not on file   Family History  Problem Relation Age of Onset  . Hypertension Mother   . Cancer Mother     endometrial CA  . Alcohol abuse Father   . Diabetes Father   . Stroke Father    Allergies  Allergen Reactions  . Hydroxychloroquine Sulfate     REACTION: rash  . Ibandronate Sodium     REACTION: rash  . Risedronate  Sodium     REACTION: rash   Current Outpatient Prescriptions on File Prior to Visit  Medication Sig Dispense Refill  . FLUoxetine (PROZAC) 10 MG capsule TAKE ONE CAPSULE BY MOUTH EVERY DAY  30 capsule  11  . levothyroxine (SYNTHROID, LEVOTHROID) 112 MCG tablet TAKE ONE TABLET BY MOUTH EVERY DAY  30 tablet  11     Review of Systems Review of Systems  Constitutional: Negative for fever, appetite change, fatigue and unexpected weight change.  Eyes: Negative for pain and visual disturbance.  Respiratory: Negative for cough and shortness of breath.   Cardiovascular: Negative for cp or palpitations    Gastrointestinal: Negative for nausea, diarrhea and constipation.  Genitourinary: Negative for urgency and frequency.  Skin: Negative for pallor or rash   Neurological: Negative for weakness, light-headedness, numbness and headaches.  Hematological: Negative for adenopathy. Does not bruise/bleed easily.  Psychiatric/Behavioral: Negative for dysphoric mood. The patient is not nervous/anxious.          Objective:   Physical Exam  Constitutional: She appears well-developed and  well-nourished. No distress.       overwt and well appearing   HENT:  Head: Normocephalic and atraumatic.  Mouth/Throat: Oropharynx is clear and moist.  Eyes: Conjunctivae and EOM are normal. Pupils are equal, round, and reactive to light.  Neck: Normal range of motion. Neck supple. No tracheal deviation present. No thyromegaly present.  Cardiovascular: Normal rate, regular rhythm, normal heart sounds and intact distal pulses.   Pulmonary/Chest: Effort normal and breath sounds normal. No respiratory distress. She has no wheezes. She exhibits no tenderness.  Abdominal: Soft. Bowel sounds are normal. There is no tenderness.  Musculoskeletal: Normal range of motion. She exhibits tenderness. She exhibits no edema.       Joint tenderness baseline   No kyphosis  Lymphadenopathy:    She has no cervical adenopathy.    Neurological: She is alert. She has normal reflexes. No cranial nerve deficit. Coordination normal.       No tremor   Skin: Skin is warm and dry. No rash noted. No erythema. No pallor.  Psychiatric: She has a normal mood and affect.          Assessment & Plan:

## 2011-07-14 NOTE — Assessment & Plan Note (Signed)
Pt is back on current 112 mcg dose of synthroid without problems Expect imp in labs tsh and free T4 today and adv  Commended wt loss and enc to stay active  If labs ok will inst f/u 6 mo

## 2012-04-06 ENCOUNTER — Other Ambulatory Visit: Payer: Self-pay | Admitting: Family Medicine

## 2012-04-06 NOTE — Telephone Encounter (Signed)
Done

## 2012-04-27 ENCOUNTER — Encounter: Payer: Self-pay | Admitting: Family Medicine

## 2012-04-27 ENCOUNTER — Ambulatory Visit (INDEPENDENT_AMBULATORY_CARE_PROVIDER_SITE_OTHER): Payer: PRIVATE HEALTH INSURANCE | Admitting: Family Medicine

## 2012-04-27 VITALS — BP 136/82 | HR 88 | Temp 97.7°F | Ht 64.0 in | Wt 179.8 lb

## 2012-04-27 DIAGNOSIS — E039 Hypothyroidism, unspecified: Secondary | ICD-10-CM

## 2012-04-27 DIAGNOSIS — T451X5A Adverse effect of antineoplastic and immunosuppressive drugs, initial encounter: Secondary | ICD-10-CM

## 2012-04-27 DIAGNOSIS — M810 Age-related osteoporosis without current pathological fracture: Secondary | ICD-10-CM

## 2012-04-27 DIAGNOSIS — E559 Vitamin D deficiency, unspecified: Secondary | ICD-10-CM

## 2012-04-27 LAB — CBC WITH DIFFERENTIAL/PLATELET
Basophils Absolute: 0 10*3/uL (ref 0.0–0.1)
Basophils Relative: 0.7 % (ref 0.0–3.0)
HCT: 42.8 % (ref 36.0–46.0)
Hemoglobin: 14.2 g/dL (ref 12.0–15.0)
Lymphs Abs: 1.9 10*3/uL (ref 0.7–4.0)
MCHC: 33.2 g/dL (ref 30.0–36.0)
Monocytes Relative: 8.6 % (ref 3.0–12.0)
Neutro Abs: 4.2 10*3/uL (ref 1.4–7.7)
RDW: 14.4 % (ref 11.5–14.6)

## 2012-04-27 LAB — COMPREHENSIVE METABOLIC PANEL
AST: 17 U/L (ref 0–37)
Albumin: 3.7 g/dL (ref 3.5–5.2)
Alkaline Phosphatase: 100 U/L (ref 39–117)
Calcium: 9.1 mg/dL (ref 8.4–10.5)
Glucose, Bld: 87 mg/dL (ref 70–99)
Total Protein: 7.1 g/dL (ref 6.0–8.3)

## 2012-04-27 LAB — TSH: TSH: 1.94 u[IU]/mL (ref 0.35–5.50)

## 2012-04-27 NOTE — Assessment & Plan Note (Signed)
cmp and cbc today  Will forward to Dr Jon Billings also

## 2012-04-27 NOTE — Patient Instructions (Addendum)
Labs today  May want to consider dexa in future - you may be a candidate for a non bisphosphenate drug like evista (ask Dr Jon Billings about it ) Keep active and take care of yourself

## 2012-04-27 NOTE — Assessment & Plan Note (Signed)
Level today Is compliant with supplement

## 2012-04-27 NOTE — Assessment & Plan Note (Signed)
tsh today Was theraputic in fall- now having a bit more heat intolerance , otherwise no clinical changes Will update

## 2012-04-27 NOTE — Assessment & Plan Note (Signed)
Pt allergic to bisphosphenates  ? Last dexa Is due but wonders about tx opt Briefly disc evista  She will disc with rheum before ordering dexa Also continue ca and D D level today

## 2012-04-27 NOTE — Progress Notes (Signed)
Subjective:    Patient ID: Samantha Valentine, female    DOB: [birth year], 66 y.o.   MRN: [id number]  HPI Here for f/u of hypothyroidism and chronic conditions  Is doing ok overall    Has been on same dose since fall  Lab Results  Component Value Date   TSH 1.58 07/14/2011   wt is up 2 lb with bmi of 30 Is time to check thyroid also  Sweating more and warmer than usual lately  Also cbc with diff and cmp - for her methotrexate    (send to Dr Jon Billings  Her [employer] insurance ends at end of July and then - medicare starts at 73 Wants to get thyroid checked   Arthritis is stable Had to quit work and it turns out that was the right decision  Is working hard to stay active  She was able to cut back on celebrex and enbrel some    Vit D def Level was tx in fall  Patient Active Problem List  Diagnosis  . HYPOTHYROIDISM  . VITAMIN D DEFICIENCY  . ANXIETY DEPRESSION  . HEARING LOSS  . RHEUMATOID ARTHRITIS  . DEGENERATIVE DISC DISEASE  . BACK PAIN  . FIBROMYALGIA  . OSTEOPOROSIS  . INSECT BITE, LOWER ARM  . MIGRAINES, HX OF   Past Medical History  Diagnosis Date  . Depression   . Hypothyroidism   . Osteoporosis   . Arthritis     RA  . DDD (degenerative disc disease)     in neck  . Migraine    Past Surgical History  Procedure Date  . Tonsillectomy   . Lithotripsy     kidney stone  . Joint replacement     total hip replacement   History  Substance Use Topics  . Smoking status: Former Smoker    Quit date: 10/20/1983  . Smokeless tobacco: Not on file  . Alcohol Use: No   Family History  Problem Relation Age of Onset  . Hypertension Mother   . Cancer Mother     endometrial CA  . Alcohol abuse Father   . Diabetes Father   . Stroke Father    Allergies  Allergen Reactions  . Hydroxychloroquine Sulfate     REACTION: rash  . Ibandronate Sodium     REACTION: rash  . Risedronate Sodium     REACTION: rash   Current Outpatient Prescriptions on File Prior to Visit    Medication Sig Dispense Refill  . CELEBREX 200 MG capsule Take 200-400 mg by mouth daily.       . cyclobenzaprine (FLEXERIL) 10 MG tablet Take 10 mg by mouth 3 (three) times daily as needed.       Elgie Collard SURECLICK 50 MG/ML injection Inject 50 mg into the skin once a week.       Marland Kitchen FLUoxetine (PROZAC) 10 MG capsule TAKE ONE CAPSULE BY MOUTH EVERY DAY  30 capsule  11  . folic acid (FOLVITE) 1 MG tablet Take 1 mg by mouth daily.       Marland Kitchen levothyroxine (SYNTHROID, LEVOTHROID) 112 MCG tablet TAKE ONE TABLET BY MOUTH EVERY DAY  30 tablet  3  . methotrexate (RHEUMATREX) 2.5 MG tablet Take 17.5 mg by mouth once a week.       . traMADol (ULTRAM) 50 MG tablet Take 50 mg by mouth every 6 (six) hours as needed.          Patient Active Problem List  Diagnosis  . HYPOTHYROIDISM  .  VITAMIN D DEFICIENCY  . ANXIETY DEPRESSION  . HEARING LOSS  . RHEUMATOID ARTHRITIS  . DEGENERATIVE DISC DISEASE  . BACK PAIN  . FIBROMYALGIA  . OSTEOPOROSIS  . INSECT BITE, LOWER ARM  . MIGRAINES, HX OF  . Adverse effect of immunosuppressive drug   Past Medical History  Diagnosis Date  . Depression   . Hypothyroidism   . Osteoporosis   . Arthritis     RA  . DDD (degenerative disc disease)     in neck  . Migraine    Past Surgical History  Procedure Date  . Tonsillectomy   . Lithotripsy     kidney stone  . Joint replacement     total hip replacement   History  Substance Use Topics  . Smoking status: Former Smoker    Quit date: 10/20/1983  . Smokeless tobacco: Not on file  . Alcohol Use: No   Family History  Problem Relation Age of Onset  . Hypertension Mother   . Cancer Mother     endometrial CA  . Alcohol abuse Father   . Diabetes Father   . Stroke Father    Allergies  Allergen Reactions  . Hydroxychloroquine Sulfate     REACTION: rash  . Ibandronate Sodium     REACTION: rash  . Risedronate Sodium     REACTION: rash   Current Outpatient Prescriptions on File Prior to Visit   Medication Sig Dispense Refill  . CELEBREX 200 MG capsule Take 200-400 mg by mouth daily.       . cyclobenzaprine (FLEXERIL) 10 MG tablet Take 10 mg by mouth 3 (three) times daily as needed.       Elgie Collard SURECLICK 50 MG/ML injection Inject 50 mg into the skin once a week.       Marland Kitchen FLUoxetine (PROZAC) 10 MG capsule TAKE ONE CAPSULE BY MOUTH EVERY DAY  30 capsule  11  . folic acid (FOLVITE) 1 MG tablet Take 1 mg by mouth daily.       Marland Kitchen levothyroxine (SYNTHROID, LEVOTHROID) 112 MCG tablet TAKE ONE TABLET BY MOUTH EVERY DAY  30 tablet  3  . methotrexate (RHEUMATREX) 2.5 MG tablet Take 17.5 mg by mouth once a week.       . traMADol (ULTRAM) 50 MG tablet Take 50 mg by mouth every 6 (six) hours as needed.           Review of Systems Review of Systems  Constitutional: Negative for fever, appetite change, fatigue and unexpected weight change. pos for heat intolerance, fatigue is improved  Eyes: Negative for pain and visual disturbance.  Respiratory: Negative for cough and shortness of breath.   Cardiovascular: Negative for cp or palpitations    Gastrointestinal: Negative for nausea, diarrhea and constipation.  Genitourinary: Negative for urgency and frequency.  Skin: Negative for pallor or rash   MSK pos for joint pain and swelling at times  Neurological: Negative for weakness, light-headedness, numbness and headaches.  Hematological: Negative for adenopathy. Does not bruise/bleed easily.  Psychiatric/Behavioral: Negative for dysphoric mood. The patient is not nervous/anxious.         Objective:   Physical Exam  Constitutional: She appears well-developed and well-nourished. No distress.       overwt and well appearing   HENT:  Head: Normocephalic and atraumatic.  Mouth/Throat: Oropharynx is clear and moist.  Eyes: Conjunctivae and EOM are normal. Pupils are equal, round, and reactive to light. No scleral icterus.  Neck: Normal range of motion.  Neck supple. No JVD present. Carotid bruit  is not present. Erythema present. No thyromegaly present.  Cardiovascular: Normal rate, regular rhythm, normal heart sounds and intact distal pulses.  Exam reveals no gallop.   Pulmonary/Chest: Breath sounds normal. No respiratory distress. She has no wheezes.  Abdominal: Soft. Bowel sounds are normal. She exhibits no distension, no abdominal bruit and no mass. There is no tenderness.  Musculoskeletal: She exhibits tenderness.       Slow gait due to joint pain   Lymphadenopathy:    She has no cervical adenopathy.  Neurological: She is alert. She has normal reflexes. No cranial nerve deficit. She exhibits normal muscle tone. Coordination normal.  Skin: Skin is warm and dry. No rash noted. No erythema. No pallor.  Psychiatric: She has a normal mood and affect.          Assessment & Plan:

## 2012-06-06 ENCOUNTER — Other Ambulatory Visit: Payer: Self-pay | Admitting: Family Medicine

## 2012-06-09 ENCOUNTER — Other Ambulatory Visit: Payer: Self-pay | Admitting: Family Medicine

## 2012-06-09 NOTE — Telephone Encounter (Signed)
Request for Prozac 10 mg last OV was 04/27/12. Last filled 06/01/11. Ok to refill?

## 2012-06-09 NOTE — Telephone Encounter (Signed)
Will refill electronically  

## 2012-08-08 ENCOUNTER — Other Ambulatory Visit: Payer: Self-pay | Admitting: Family Medicine

## 2013-01-04 ENCOUNTER — Telehealth: Payer: Self-pay | Admitting: Family Medicine

## 2013-01-04 MED ORDER — LEVOTHYROXINE SODIUM 112 MCG PO TABS: 112.0000 ug | ORAL_TABLET | Freq: Every day | ORAL | Status: DC

## 2013-01-04 NOTE — Telephone Encounter (Signed)
Rx sent to pharmacy and pt notified  ?

## 2013-01-04 NOTE — Telephone Encounter (Signed)
See prev note, ok to refill? No recent appt and no future appt

## 2013-01-04 NOTE — Telephone Encounter (Signed)
Go ahead and refil for generic - can have 6 mo of refils, thanks

## 2013-01-04 NOTE — Telephone Encounter (Signed)
Patient states she is having a problem getting her Synthroid refilled at the [address] [facility] in [place].  She said it is okay to call in a generic for her because she does fine on a generic med.  Please call med in for her and call pt to let her know [phone]

## 2013-06-26 ENCOUNTER — Ambulatory Visit (INDEPENDENT_AMBULATORY_CARE_PROVIDER_SITE_OTHER): Payer: Medicare Other | Admitting: Family Medicine

## 2013-06-26 ENCOUNTER — Encounter: Payer: Self-pay | Admitting: Family Medicine

## 2013-06-26 VITALS — BP 146/86 | HR 101 | Temp 97.9°F | Ht 64.0 in | Wt 183.5 lb

## 2013-06-26 DIAGNOSIS — L0201 Cutaneous abscess of face: Secondary | ICD-10-CM | POA: Insufficient documentation

## 2013-06-26 MED ORDER — SULFAMETHOXAZOLE-TMP DS 800-160 MG PO TABS: 1.0000 | ORAL_TABLET | Freq: Two times a day (BID) | ORAL | Status: DC

## 2013-06-26 NOTE — Progress Notes (Signed)
Subjective:    Patient ID: Samantha Valentine, female    DOB: 1946/08/28, 67 y.o.   MRN: [id number]  HPI Here with a red spot on her face  Started on Thursday  Thinks it is infected No insect bite She is immunocomp due to rheum med  Has been out in public and also visiting someone in hospital (in terms of mrsa exp) Never had mrsa before   Using topical abx ointment   Area is not hurting / just a bit itchy and warm  No drainage at all   She has had an area like this in the past on chin - it req oral abx   No fever- feels ok  Patient Active Problem List   Diagnosis Date Noted  . Adverse effect of immunosuppressive drug 04/27/2012  . INSECT BITE, LOWER ARM 05/09/2010  . ANXIETY DEPRESSION 10/28/2008  . BACK PAIN 12/29/2007  . HYPOTHYROIDISM 12/28/2007  . VITAMIN D DEFICIENCY 12/28/2007  . HEARING LOSS 12/28/2007  . RHEUMATOID ARTHRITIS 12/28/2007  . DEGENERATIVE DISC DISEASE 12/28/2007  . FIBROMYALGIA 12/28/2007  . OSTEOPOROSIS 12/28/2007  . MIGRAINES, HX OF 12/28/2007   Past Medical History  Diagnosis Date  . Depression   . Hypothyroidism   . Osteoporosis   . Arthritis     RA  . DDD (degenerative disc disease)     in neck  . Migraine    Past Surgical History  Procedure Laterality Date  . Tonsillectomy    . Lithotripsy      kidney stone  . Joint replacement      total hip replacement   History  Substance Use Topics  . Smoking status: Former Smoker    Quit date: 10/20/1983  . Smokeless tobacco: Not on file  . Alcohol Use: No   Family History  Problem Relation Age of Onset  . Hypertension Mother   . Cancer Mother     endometrial CA  . Alcohol abuse Father   . Diabetes Father   . Stroke Father    Allergies  Allergen Reactions  . Hydroxychloroquine Sulfate     REACTION: rash  . Ibandronate Sodium     REACTION: rash  . Risedronate Sodium     REACTION: rash   Current Outpatient Prescriptions on File Prior to Visit  Medication Sig Dispense Refill   . CELEBREX 200 MG capsule Take 200-400 mg by mouth daily.       . cyclobenzaprine (FLEXERIL) 10 MG tablet Take 10 mg by mouth 3 (three) times daily as needed.       Elgie Collard SURECLICK 50 MG/ML injection Inject 50 mg into the skin once a week.       Marland Kitchen FLUoxetine (PROZAC) 10 MG capsule TAKE ONE CAPSULE BY MOUTH EVERY DAY  30 capsule  11  . folic acid (FOLVITE) 1 MG tablet Take 2 mg by mouth daily.       Marland Kitchen levothyroxine (SYNTHROID, LEVOTHROID) 112 MCG tablet Take 1 tablet (112 mcg total) by mouth daily.  30 tablet  5  . methotrexate (RHEUMATREX) 2.5 MG tablet Take 17.5 mg by mouth once a week.       . traMADol (ULTRAM) 50 MG tablet Take 50 mg by mouth every 6 (six) hours as needed.         No current facility-administered medications on file prior to visit.      Review of Systems Review of Systems  Constitutional: Negative for fever, appetite change, fatigue and unexpected weight change.  Eyes:  Negative for pain and visual disturbance.  Respiratory: Negative for cough and shortness of breath.   Cardiovascular: Negative for cp or palpitations    Gastrointestinal: Negative for nausea, diarrhea and constipation.  Genitourinary: Negative for urgency and frequency.  Skin: Negative for pallor or rash pos for red spot on face   Neurological: Negative for weakness, light-headedness, numbness and headaches.  Hematological: Negative for adenopathy. Does not bruise/bleed easily.  Psychiatric/Behavioral: Negative for dysphoric mood. The patient is not nervous/anxious.         Objective:   Physical Exam  Constitutional: She appears well-developed and well-nourished. No distress.  HENT:  Head: Normocephalic and atraumatic.  Mouth/Throat: Oropharynx is clear and moist.  Erythematous raised area 5 mm - to the L of nose with some scabbing (no active drainage) it is firm and very slt tender  Eyes: Conjunctivae and EOM are normal. Pupils are equal, round, and reactive to light. Right eye exhibits no  discharge. Left eye exhibits no discharge. No scleral icterus.  Neck: Normal range of motion. Neck supple.  Cardiovascular: Normal rate and regular rhythm.   Pulmonary/Chest: Effort normal and breath sounds normal.  Lymphadenopathy:    She has no cervical adenopathy.  Neurological: She is alert. No cranial nerve deficit.  Skin: Skin is warm and dry. There is erythema.  See ENT exam  Psychiatric: She has a normal mood and affect.          Assessment & Plan:

## 2013-06-26 NOTE — Assessment & Plan Note (Signed)
Will cover with sulfa abx - (want to cover poss MRSA since she is immunocomprised and has been visiting in a hosp setting) Disc hygeine/ topical care and use of warm compresses  Update if not starting to improve in a week or if worsening

## 2013-06-26 NOTE — Patient Instructions (Addendum)
Take the bactrim DS as directed  Hold methotrexate until better / done with the antibiotic  Use warm compress whenever you can  Clean with antibacterial soap and water  Update if not starting to improve in a week or if worsening

## 2013-07-05 ENCOUNTER — Encounter: Payer: Self-pay | Admitting: Family Medicine

## 2013-07-05 ENCOUNTER — Ambulatory Visit (INDEPENDENT_AMBULATORY_CARE_PROVIDER_SITE_OTHER): Payer: Medicare Other | Admitting: Family Medicine

## 2013-07-05 VITALS — BP 124/78 | HR 93 | Temp 97.9°F | Ht 64.0 in | Wt 182.8 lb

## 2013-07-05 DIAGNOSIS — E559 Vitamin D deficiency, unspecified: Secondary | ICD-10-CM

## 2013-07-05 DIAGNOSIS — Z23 Encounter for immunization: Secondary | ICD-10-CM

## 2013-07-05 DIAGNOSIS — E039 Hypothyroidism, unspecified: Secondary | ICD-10-CM

## 2013-07-05 DIAGNOSIS — T451X5S Adverse effect of antineoplastic and immunosuppressive drugs, sequela: Secondary | ICD-10-CM

## 2013-07-05 DIAGNOSIS — T50905S Adverse effect of unspecified drugs, medicaments and biological substances, sequela: Secondary | ICD-10-CM

## 2013-07-05 DIAGNOSIS — T451X5A Adverse effect of antineoplastic and immunosuppressive drugs, initial encounter: Secondary | ICD-10-CM

## 2013-07-05 DIAGNOSIS — F341 Dysthymic disorder: Secondary | ICD-10-CM

## 2013-07-05 LAB — TSH: TSH: 2.15 u[IU]/mL (ref 0.35–5.50)

## 2013-07-05 LAB — CBC WITH DIFFERENTIAL/PLATELET
Basophils Relative: 0.7 % (ref 0.0–3.0)
Eosinophils Relative: 4.1 % (ref 0.0–5.0)
HCT: 41.5 % (ref 36.0–46.0)
MCV: 92.4 fl (ref 78.0–100.0)
Monocytes Absolute: 0.6 10*3/uL (ref 0.1–1.0)
Monocytes Relative: 8.9 % (ref 3.0–12.0)
Neutrophils Relative %: 54.6 % (ref 43.0–77.0)
RBC: 4.49 Mil/uL (ref 3.87–5.11)
WBC: 6.7 10*3/uL (ref 4.5–10.5)

## 2013-07-05 LAB — COMPREHENSIVE METABOLIC PANEL
Albumin: 3.6 g/dL (ref 3.5–5.2)
Alkaline Phosphatase: 81 U/L (ref 39–117)
BUN: 16 mg/dL (ref 6–23)
CO2: 26 mEq/L (ref 19–32)
Calcium: 8.8 mg/dL (ref 8.4–10.5)
GFR: 78.62 mL/min (ref >60.00)
Glucose, Bld: 104 mg/dL — ABNORMAL HIGH (ref 70–99)
Potassium: 4.2 mEq/L (ref 3.5–5.1)

## 2013-07-05 MED ORDER — LORAZEPAM 1 MG PO TABS: 1.0000 mg | ORAL_TABLET | Freq: Every day | ORAL | Status: DC | PRN

## 2013-07-05 NOTE — Patient Instructions (Addendum)
Flu vaccine today Pneumonia vaccine today  You are due for a tetanus shot - get that [facility] the [facility] since medicare does not pay for it Use ativan sparingly when needed  Will send labs to Dr Jon Billings

## 2013-07-05 NOTE — Progress Notes (Signed)
Subjective:    Patient ID: Samantha Valentine, female    DOB: 1945/11/02, 67 y.o.   MRN: [id number]  HPI Here for f/u of chronic health problems   Spot on face is improved / but not gone yet - and thinks she had an allergic rxn in retrospect  Is scabbed over   Doing ok overall   Having a lot of trouble sleeping (again )- suspect thyroid  Also had lost some friends lately- some grief (aunt and a friend)  She does not take otc meds for sleep  Anxiety has been a bit worse - has taken ativan as needed in the past (prozac keeps her more stimulated)  Wants to add methotrexate labs for Dr Jon Billings also to save her a trip   Thinks she gets more anxious as she gets older   Wants to get her flu shot today  Patient Active Problem List   Diagnosis Date Noted  . Cellulitis and abscess of face 06/26/2013  . Adverse effect of immunosuppressive drug 04/27/2012  . INSECT BITE, LOWER ARM 05/09/2010  . ANXIETY DEPRESSION 10/28/2008  . BACK PAIN 12/29/2007  . HYPOTHYROIDISM 12/28/2007  . VITAMIN D DEFICIENCY 12/28/2007  . HEARING LOSS 12/28/2007  . RHEUMATOID ARTHRITIS 12/28/2007  . DEGENERATIVE DISC DISEASE 12/28/2007  . FIBROMYALGIA 12/28/2007  . OSTEOPOROSIS 12/28/2007  . MIGRAINES, HX OF 12/28/2007   Past Medical History  Diagnosis Date  . Depression   . Hypothyroidism   . Osteoporosis   . Arthritis     RA  . DDD (degenerative disc disease)     in neck  . Migraine    Past Surgical History  Procedure Laterality Date  . Tonsillectomy    . Lithotripsy      kidney stone  . Joint replacement      total hip replacement   History  Substance Use Topics  . Smoking status: Former Smoker    Quit date: 10/20/1983  . Smokeless tobacco: Not on file  . Alcohol Use: No   Family History  Problem Relation Age of Onset  . Hypertension Mother   . Cancer Mother     endometrial CA  . Alcohol abuse Father   . Diabetes Father   . Stroke Father    Allergies  Allergen Reactions  .  Hydroxychloroquine Sulfate     REACTION: rash  . Ibandronate Sodium     REACTION: rash  . Risedronate Sodium     REACTION: rash   Current Outpatient Prescriptions on File Prior to Visit  Medication Sig Dispense Refill  . CELEBREX 200 MG capsule Take 200-400 mg by mouth daily.       . cyclobenzaprine (FLEXERIL) 10 MG tablet Take 10 mg by mouth 3 (three) times daily as needed.       Elgie Collard SURECLICK 50 MG/ML injection Inject 50 mg into the skin once a week.       Marland Kitchen FLUoxetine (PROZAC) 10 MG capsule TAKE ONE CAPSULE BY MOUTH EVERY DAY  30 capsule  11  . folic acid (FOLVITE) 1 MG tablet Take 2 mg by mouth daily.       Marland Kitchen levothyroxine (SYNTHROID, LEVOTHROID) 112 MCG tablet Take 1 tablet (112 mcg total) by mouth daily.  30 tablet  5  . methotrexate (RHEUMATREX) 2.5 MG tablet Take 17.5 mg by mouth once a week.       . traMADol (ULTRAM) 50 MG tablet Take 50 mg by mouth every 6 (six) hours as needed.  No current facility-administered medications on file prior to visit.    Review of Systems Review of Systems  Constitutional: Negative for fever, appetite change,  and unexpected weight change.  Eyes: Negative for pain and visual disturbance.  Respiratory: Negative for cough and shortness of breath.   Cardiovascular: Negative for cp or palpitations    Gastrointestinal: Negative for nausea, diarrhea and constipation.  Genitourinary: Negative for urgency and frequency.  Skin: Negative for pallor or rash   MSK pos for joint pain and swelling from RA Neurological: Negative for weakness, light-headedness, numbness and headaches.  Hematological: Negative for adenopathy. Does not bruise/bleed easily.  Psychiatric/Behavioral: Negative for dysphoric mood. The patient is more anxious lately        Objective:   Physical Exam  Constitutional: She appears well-developed and well-nourished. No distress.  obese and well appearing   HENT:  Head: Normocephalic and atraumatic.  Right Ear:  External ear normal.  Left Ear: External ear normal.  Nose: Nose normal.  Mouth/Throat: Oropharynx is clear and moist.  Eyes: Conjunctivae and EOM are normal. Pupils are equal, round, and reactive to light. Right eye exhibits no discharge. Left eye exhibits no discharge. No scleral icterus.  Neck: Normal range of motion. Neck supple. No JVD present. Carotid bruit is not present. No thyromegaly present.  Cardiovascular: Normal rate, regular rhythm, normal heart sounds and intact distal pulses.  Exam reveals no gallop.   Pulmonary/Chest: Effort normal and breath sounds normal. No respiratory distress. She has no wheezes. She exhibits no tenderness.  Abdominal: Soft. Bowel sounds are normal. She exhibits no distension, no abdominal bruit and no mass. There is no tenderness.  Musculoskeletal: She exhibits tenderness. She exhibits no edema.  Lymphadenopathy:    She has no cervical adenopathy.  Neurological: She is alert. She has normal reflexes. No cranial nerve deficit. She exhibits normal muscle tone. Coordination normal.  Skin: Skin is warm and dry. No rash noted. No erythema. No pallor.  Erythematous spot on L cheek improved with a scab  Psychiatric: She has a normal mood and affect. Her speech is normal and behavior is normal. Thought content normal. Her mood appears not anxious. She does not exhibit a depressed mood.  Not anx or depressed today Talkative and pleasant          Assessment & Plan:

## 2013-07-06 NOTE — Assessment & Plan Note (Signed)
Labs for methotrexate Will forward to Dr Jon Billings

## 2013-07-06 NOTE — Assessment & Plan Note (Signed)
Disc imp to bone and overall health Level today with labs

## 2013-07-06 NOTE — Assessment & Plan Note (Signed)
Lab today  If tsh is theraputic will refill dose  No clinical changes

## 2013-07-06 NOTE — Assessment & Plan Note (Signed)
Refilled ativan for sparing use for anx when not driving Disc risks of habit Will update if no imp  Offered counseling ref

## 2013-07-10 ENCOUNTER — Encounter: Payer: Self-pay | Admitting: Family Medicine

## 2013-07-10 ENCOUNTER — Other Ambulatory Visit: Payer: Self-pay | Admitting: Family Medicine

## 2013-07-10 MED ORDER — LEVOTHYROXINE SODIUM 112 MCG PO TABS: 112.0000 ug | ORAL_TABLET | Freq: Every day | ORAL | Status: DC

## 2013-07-11 NOTE — Telephone Encounter (Signed)
Please refill for a year thanks 

## 2013-07-11 NOTE — Telephone Encounter (Signed)
Electronic refill request, please advise  

## 2013-07-12 NOTE — Telephone Encounter (Signed)
done

## 2013-10-25 ENCOUNTER — Ambulatory Visit (INDEPENDENT_AMBULATORY_CARE_PROVIDER_SITE_OTHER): Payer: Medicare HMO | Admitting: Family Medicine

## 2013-10-25 ENCOUNTER — Ambulatory Visit (INDEPENDENT_AMBULATORY_CARE_PROVIDER_SITE_OTHER)
Admission: RE | Admit: 2013-10-25 | Discharge: 2013-10-25 | Disposition: A | Discharge status: Home / Self Care | Payer: Medicare HMO | Source: Ambulatory Visit | Attending: Family Medicine | Admitting: Family Medicine

## 2013-10-25 ENCOUNTER — Encounter: Payer: Self-pay | Admitting: Family Medicine

## 2013-10-25 VITALS — BP 124/76 | HR 86 | Temp 97.9°F | Ht 64.0 in | Wt 183.5 lb

## 2013-10-25 DIAGNOSIS — J209 Acute bronchitis, unspecified: Secondary | ICD-10-CM

## 2013-10-25 MED ORDER — HYDROCODONE-HOMATROPINE 5-1.5 MG/5ML PO SYRP: 5.0000 mL | ORAL_SOLUTION | Freq: Three times a day (TID) | ORAL | Status: DC | PRN

## 2013-10-25 MED ORDER — BENZONATATE 200 MG PO CAPS: 200.0000 mg | ORAL_CAPSULE | Freq: Three times a day (TID) | ORAL | Status: DC | PRN

## 2013-10-25 NOTE — Progress Notes (Signed)
Pre-visit discussion using our clinic review tool. No additional management support is needed unless otherwise documented below in the visit note.  

## 2013-10-25 NOTE — Progress Notes (Signed)
Subjective:    Patient ID: Samantha Valentine, female    DOB: 1946-08-04, 68 y.o.   MRN: [id number]  HPI Here with a cough   Around xmas- was exposed to a sick child- then she developed cold symptoms  Now just in her chest  nonprod cough / and holding her methotrexate for this  Getting tired of coughing / sore chest  Pulse ox 97  No fever  ? If a little fever for first day   No post nasal drip or sinus pain  No heartburn  Always has a globus sensation- for years  No wheezing - above and beyond usual   Has used expectorant only    Her friend listened to her chest    Patient Active Problem List   Diagnosis Date Noted  . Cellulitis and abscess of face 06/26/2013  . Adverse effect of immunosuppressive drug 04/27/2012  . ANXIETY DEPRESSION 10/28/2008  . BACK PAIN 12/29/2007  . HYPOTHYROIDISM 12/28/2007  . VITAMIN D DEFICIENCY 12/28/2007  . HEARING LOSS 12/28/2007  . RHEUMATOID ARTHRITIS 12/28/2007  . [place] DISEASE 12/28/2007  . FIBROMYALGIA 12/28/2007  . OSTEOPOROSIS 12/28/2007  . MIGRAINES, HX OF 12/28/2007   Past Medical History  Diagnosis Date  . Depression   . Hypothyroidism   . Osteoporosis   . Arthritis     RA  . DDD (degenerative disc disease)     in neck  . Migraine    Past Surgical History  Procedure Laterality Date  . Tonsillectomy    . Lithotripsy      kidney stone  . Joint replacement      total hip replacement   History  Substance Use Topics  . Smoking status: Former Smoker    Quit date: 10/20/1983  . Smokeless tobacco: Not on file  . Alcohol Use: No   Family History  Problem Relation Age of Onset  . Hypertension Mother   . Cancer Mother     endometrial CA  . Alcohol abuse Father   . Diabetes Father   . Stroke Father    Allergies  Allergen Reactions  . Hydroxychloroquine Sulfate     REACTION: rash  . Ibandronate Sodium     REACTION: rash  . Risedronate Sodium     REACTION: rash   Current Outpatient Prescriptions on  File Prior to Visit  Medication Sig Dispense Refill  . CELEBREX 200 MG capsule Take 200-400 mg by mouth daily.       . cyclobenzaprine (FLEXERIL) 10 MG tablet Take 10 mg by mouth 3 (three) times daily as needed.       Scarlette Shorts SURECLICK 50 MG/ML injection Inject 50 mg into the skin once a week.       Marland Kitchen FLUoxetine (PROZAC) 10 MG capsule TAKE ONE CAPSULE BY MOUTH EVERY DAY  30 capsule  11  . folic acid (FOLVITE) 1 MG tablet Take 2 mg by mouth daily.       Marland Kitchen levothyroxine (SYNTHROID, LEVOTHROID) 112 MCG tablet Take 1 tablet (112 mcg total) by mouth daily.  30 tablet  5  . LORazepam (ATIVAN) 1 MG tablet Take 1 tablet (1 mg total) by mouth daily as needed for anxiety.  30 tablet  1  . methotrexate (RHEUMATREX) 2.5 MG tablet Take 17.5 mg by mouth once a week.       . traMADol (ULTRAM) 50 MG tablet Take 50 mg by mouth every 6 (six) hours as needed.  No current facility-administered medications on file prior to visit.      Review of Systems Review of Systems  Constitutional: Negative for fever, appetite change,  and unexpected weight change.  ENT neg for cong or rhinorrhea or sinus pain , pos for post nasal drip and globus sensation  Eyes: Negative for pain and visual disturbance.  Respiratory: Negative for  shortness of breath.   Cardiovascular: Negative for cp or palpitations    Gastrointestinal: Negative for nausea, diarrhea and constipation.  Genitourinary: Negative for urgency and frequency.  Skin: Negative for pallor or rash   Neurological: Negative for weakness, light-headedness, numbness and headaches.  Hematological: Negative for adenopathy. Does not bruise/bleed easily.  Psychiatric/Behavioral: Negative for dysphoric mood. The patient is not nervous/anxious.         Objective:   Physical Exam  Constitutional: She appears well-developed and well-nourished. No distress.  obese and well appearing   HENT:  Head: Normocephalic and atraumatic.  Right Ear: External ear normal.   Left Ear: External ear normal.  Mouth/Throat: Oropharynx is clear and moist. No oropharyngeal exudate.  Nares are boggy No sinus tenderness   Eyes: Conjunctivae and EOM are normal. Pupils are equal, round, and reactive to light. Right eye exhibits no discharge. Left eye exhibits no discharge.  Neck: Neck supple.  Cardiovascular: Normal rate and regular rhythm.   Pulmonary/Chest: Effort normal and breath sounds normal. No respiratory distress. She has no wheezes. She has no rales.  Scant rhonchi heard loudest at R base   Lymphadenopathy:    She has no cervical adenopathy.  Neurological: She is alert.  Skin: Skin is warm and dry. No rash noted. No pallor.  Psychiatric: She has a normal mood and affect.          Assessment & Plan:

## 2013-10-25 NOTE — Patient Instructions (Signed)
Try the hycodan cough med at night - watch out for sedation  Tessalon - three times daily  Fluids and rest cxr now - we will call you with result and instructions

## 2013-10-26 NOTE — Assessment & Plan Note (Signed)
Improved since end of December but with a lingering cough Suspect cyclic cough cxr today in light of her current immonosup meds (she is holding) For cough control/ to break the cycle- hycodan at bedtime and tessalon tid  Update if not starting to improve in a week or if worsening

## 2013-10-27 ENCOUNTER — Encounter: Payer: Self-pay | Admitting: Family Medicine

## 2013-11-06 ENCOUNTER — Ambulatory Visit: Payer: Medicare HMO | Admitting: Family Medicine

## 2013-11-06 ENCOUNTER — Telehealth: Payer: Self-pay | Admitting: *Deleted

## 2013-11-06 NOTE — Telephone Encounter (Signed)
That is fine 

## 2013-11-06 NOTE — Telephone Encounter (Signed)
Left detailed message on voicemail.  

## 2013-11-06 NOTE — Telephone Encounter (Signed)
Received a faxed note from pharmacy stating that Tessalon 200 mg is on back order and they want to know if they can switch to 100 mg. Please advise Pharmacy [facility], [place], [place].

## 2013-11-08 ENCOUNTER — Encounter: Payer: Self-pay | Admitting: Family Medicine

## 2013-11-08 ENCOUNTER — Ambulatory Visit (INDEPENDENT_AMBULATORY_CARE_PROVIDER_SITE_OTHER)
Admission: RE | Admit: 2013-11-08 | Discharge: 2013-11-08 | Disposition: A | Discharge status: Home / Self Care | Payer: Medicare HMO | Source: Ambulatory Visit | Attending: Family Medicine | Admitting: Family Medicine

## 2013-11-08 ENCOUNTER — Ambulatory Visit (INDEPENDENT_AMBULATORY_CARE_PROVIDER_SITE_OTHER): Payer: Medicare HMO | Admitting: Family Medicine

## 2013-11-08 VITALS — BP 118/72 | HR 89 | Temp 98.2°F | Ht 64.0 in | Wt 184.5 lb

## 2013-11-08 DIAGNOSIS — S92309A Fracture of unspecified metatarsal bone(s), unspecified foot, initial encounter for closed fracture: Secondary | ICD-10-CM

## 2013-11-08 DIAGNOSIS — S92353A Displaced fracture of fifth metatarsal bone, unspecified foot, initial encounter for closed fracture: Secondary | ICD-10-CM

## 2013-11-08 NOTE — Patient Instructions (Signed)
F/u 1 month with Dr. Lorelei Pont  Xrays 20 minutes before appointment

## 2013-11-08 NOTE — Progress Notes (Signed)
Pre-visit discussion using our clinic review tool. No additional management support is needed unless otherwise documented below in the visit note.  

## 2013-11-08 NOTE — Progress Notes (Signed)
Date:  11/08/2013   Name:  Samantha Valentine   DOB:  1946/04/11   MRN:  [id number] Gender: female Age: 68 y.o.  Primary Physician:  Roxy Manns, MD   Chief Complaint: Foot Injury   Subjective:   History of Present Illness:  Samantha Valentine is a 68 y.o. pleasant patient with RA on Enbrel and MTX who presents with the following:  Dr. Milinda Antis asked me to evaluate the patient with prior R 5th MT fracture:  The patient comes to me by way of a referral through one of my partners, after the patient had initially been seen for this injury by her rheumatologist one month ago without further followup. Her initial films are here with her, and we've reviewed them together. There is an oblique fracture through the base of the 5th metatarsal, images from one month ago, I believe the exact date of that image was October 04, 2013, but that is from memory.  She has been in a postoperative shoe, and she clinically is improving with decreased pain and swelling. Foot generally feels pretty good right now, but she has been wearing her postoperative shoe for one month.  Patient Active Problem List   Diagnosis Date Noted  . Acute bronchitis 10/25/2013  . Cellulitis and abscess of face 06/26/2013  . Adverse effect of immunosuppressive drug 04/27/2012  . ANXIETY DEPRESSION 10/28/2008  . BACK PAIN 12/29/2007  . HYPOTHYROIDISM 12/28/2007  . VITAMIN D DEFICIENCY 12/28/2007  . HEARING LOSS 12/28/2007  . RHEUMATOID ARTHRITIS 12/28/2007  . DEGENERATIVE DISC DISEASE 12/28/2007  . FIBROMYALGIA 12/28/2007  . OSTEOPOROSIS 12/28/2007  . MIGRAINES, HX OF 12/28/2007    Past Medical History  Diagnosis Date  . Depression   . Hypothyroidism   . Osteoporosis   . Arthritis     RA  . DDD (degenerative disc disease)     in neck  . Migraine     Past Surgical History  Procedure Laterality Date  . Tonsillectomy    . Lithotripsy      kidney stone  . Joint replacement      total hip replacement    History    Social History  . Marital Status: Divorced    Spouse Name: N/A    Number of Children: N/A  . Years of Education: N/A   Occupational History  . Not on file.   Social History Main Topics  . Smoking status: Former Smoker    Quit date: 10/20/1983  . Smokeless tobacco: Never Used  . Alcohol Use: No  . Drug Use: No  . Sexual Activity: Not on file   Other Topics Concern  . Not on file   Social History Narrative  . No narrative on file    Family History  Problem Relation Age of Onset  . Hypertension Mother   . Cancer Mother     endometrial CA  . Alcohol abuse Father   . Diabetes Father   . Stroke Father     Allergies  Allergen Reactions  . Hydroxychloroquine Sulfate     REACTION: rash  . Ibandronate Sodium     REACTION: rash  . Risedronate Sodium     REACTION: rash    Medication list has been reviewed and updated.  Review of Systems:  GEN: No fevers, chills. Nontoxic. Primarily MSK c/o today. MSK: Detailed in the HPI GI: tolerating PO intake without difficulty Neuro: No numbness, parasthesias, or tingling associated. Otherwise the pertinent positives of the ROS are noted above.  Objective:   Physical Examination: BP 118/72  Pulse 89  Temp(Src) 98.2 F (36.8 C) (Oral)  Ht 5\' 4"  (1.626 m)  Wt 184 lb 8 oz (83.689 kg)  BMI 31.65 kg/m2  Ideal Body Weight: Weight in (lb) to have BMI = 25: 145.3   GEN: WDWN, NAD, Non-toxic, Alert & Oriented x 3 HEENT: Atraumatic, Normocephalic.  Ears and Nose: No external deformity. EXTR: No clubbing/cyanosis/edema NEURO: Normal gait.  PSYCH: Normally interactive. Conversant. Not depressed or anxious appearing.  Calm demeanor.    Entirety of the patient's foot throughout the forefoot midfoot and hindfoot and all bony anatomy is nontender. There is no significant tenderness around the base of her 5th metatarsal. There is no bruising and no swelling.  Dg Foot Complete Right  11/08/2013   CLINICAL DATA:  History of  previous right fifth metatarsal base fracture.  EXAM: RIGHT FOOT COMPLETE - 3+ VIEW  COMPARISON:  None.  FINDINGS: There is an obliquely oriented fracture through the base of the fifth metatarsal. There is distraction of the fracture fragments by approximately 2 mm. There is no significant periosteal reaction or evidence of bony bridging. The 1st through 4th metatarsals appear intact. The phalanges are grossly intact. The bones of the hindfoot exhibit no acute abnormality.  IMPRESSION: There is a nonunited fracture of the base of the fifth metatarsal. I have no previous films with which to compare.   Electronically Signed   By: David  Martinique   On: 11/08/2013 15:42    Assessment & Plan:    Fracture of 5th metatarsal - Plan: DG Foot Complete Right  I think there may be a slight degree of healing compared to the 1st film, but there is minimal. I will recheck the patient in one month, continue her in her postoperative shoe, and she certainly clinically feels better.  I did review with her that the 5th metatarsal sometimes heals poorly and ultimately does need operative fixation. If she is not healing more at her next office visit, we'll likely put her in a fracture boot.   I appreciate the opportunity to evaluate this very friendly patient. If you have any question regarding her care or prognosis, do not hesitate to ask.   Patient Instructions  F/u 1 month with Dr. Lorelei Pont  Xrays 20 minutes before appointment   Orders Placed This Encounter  Procedures  . DG Foot Complete Right    New medications, updates to list, dose adjustments: No orders of the defined types were placed in this encounter.    Signed,  Maud Deed. Lorie Cleckley, MD, [place] [facility] [place] [address] Phone: [phone] Fax: [phone]    Medication List       This list is accurate as of: 11/08/13  7:12 PM.  Always use your most recent med list.                CELEBREX 200 MG capsule  Generic drug:  celecoxib  Take 200-400 mg by mouth daily.     cyclobenzaprine 10 MG tablet  Commonly known as:  FLEXERIL  Take 10 mg by mouth 3 (three) times daily as needed.     ENBREL SURECLICK 50 MG/ML injection  Generic drug:  etanercept  Inject 50 mg into the skin once a week.     FLUoxetine 10 MG capsule  Commonly known as:  PROZAC  TAKE ONE CAPSULE BY MOUTH EVERY DAY     folic acid  1 MG tablet  Commonly known as:  FOLVITE  Take 2 mg by mouth daily.     levothyroxine 112 MCG tablet  Commonly known as:  SYNTHROID, LEVOTHROID  Take 1 tablet (112 mcg total) by mouth daily.     LORazepam 1 MG tablet  Commonly known as:  ATIVAN  Take 1 tablet (1 mg total) by mouth daily as needed for anxiety.     methotrexate 2.5 MG tablet  Commonly known as:  RHEUMATREX  Take 17.5 mg by mouth once a week.     traMADol 50 MG tablet  Commonly known as:  ULTRAM  Take 50 mg by mouth every 6 (six) hours as needed.

## 2013-12-13 ENCOUNTER — Ambulatory Visit: Payer: Medicare HMO | Admitting: Family Medicine

## 2013-12-21 ENCOUNTER — Ambulatory Visit (INDEPENDENT_AMBULATORY_CARE_PROVIDER_SITE_OTHER)
Admission: RE | Admit: 2013-12-21 | Discharge: 2013-12-21 | Disposition: A | Discharge status: Home / Self Care | Payer: Medicare HMO | Source: Ambulatory Visit | Attending: Family Medicine | Admitting: Family Medicine

## 2013-12-21 ENCOUNTER — Encounter: Payer: Self-pay | Admitting: Family Medicine

## 2013-12-21 ENCOUNTER — Ambulatory Visit (INDEPENDENT_AMBULATORY_CARE_PROVIDER_SITE_OTHER): Payer: Medicare HMO | Admitting: Family Medicine

## 2013-12-21 VITALS — BP 106/80 | HR 90 | Temp 98.5°F | Ht 64.0 in | Wt 185.0 lb

## 2013-12-21 DIAGNOSIS — S92309A Fracture of unspecified metatarsal bone(s), unspecified foot, initial encounter for closed fracture: Secondary | ICD-10-CM

## 2013-12-21 NOTE — Progress Notes (Signed)
Date:  12/21/2013   Name:  Samantha Valentine   DOB:  1945-12-09   MRN:  [id number] Gender: female Age: 68 y.o.  Primary Physician:  Loura Pardon, MD   Chief Complaint: Follow-up   Subjective:   History of Present Illness:  Samantha Valentine is a 68 y.o. pleasant patient with RA on Enbrel and MTX who presents with the following:  Pleasant patient with RA, f/u R 5th MT base fracture. She is feeling very good clinically with minimal to no pain at the 5th.  11/08/2013 Last OV with Owens Loffler, MD  Dr. Glori Bickers asked me to evaluate the patient with prior R 5th MT fracture:  The patient comes to me by way of a referral through one of my partners, after the patient had initially been seen for this injury by her rheumatologist one month ago without further followup. Her initial films are here with her, and we've reviewed them together. There is an oblique fracture through the base of the 5th metatarsal, images from one month ago, I believe the exact date of that image was October 04, 2013, but that is from memory.  She has been in a postoperative shoe, and she clinically is improving with decreased pain and swelling. Foot generally feels pretty good right now, but she has been wearing her postoperative shoe for one month.  Patient Active Problem List   Diagnosis Date Noted  . Acute bronchitis 10/25/2013  . Cellulitis and abscess of face 06/26/2013  . Adverse effect of immunosuppressive drug 04/27/2012  . ANXIETY DEPRESSION 10/28/2008  . BACK PAIN 12/29/2007  . HYPOTHYROIDISM 12/28/2007  . VITAMIN D DEFICIENCY 12/28/2007  . HEARING LOSS 12/28/2007  . RHEUMATOID ARTHRITIS 12/28/2007  . [place] DISEASE 12/28/2007  . FIBROMYALGIA 12/28/2007  . OSTEOPOROSIS 12/28/2007  . MIGRAINES, HX OF 12/28/2007    Past Medical History  Diagnosis Date  . Depression   . Hypothyroidism   . Osteoporosis   . Arthritis     RA  . DDD (degenerative disc disease)     in neck  . Migraine      Past Surgical History  Procedure Laterality Date  . Tonsillectomy    . Lithotripsy      kidney stone  . Joint replacement      total hip replacement    History   Social History  . Marital Status: Divorced    Spouse Name: N/A    Number of Children: N/A  . Years of Education: N/A   Occupational History  . Not on file.   Social History Main Topics  . Smoking status: Former Smoker    Quit date: 10/20/1983  . Smokeless tobacco: Never Used  . Alcohol Use: No  . Drug Use: No  . Sexual Activity: Not on file   Other Topics Concern  . Not on file   Social History Narrative  . No narrative on file    Family History  Problem Relation Age of Onset  . Hypertension Mother   . Cancer Mother     endometrial CA  . Alcohol abuse Father   . Diabetes Father   . Stroke Father     Allergies  Allergen Reactions  . Hydroxychloroquine Sulfate     REACTION: rash  . Ibandronate Sodium     REACTION: rash  . Risedronate Sodium     REACTION: rash    Medication list has been reviewed and updated.  Review of Systems:  GEN: No fevers, chills. Nontoxic. Primarily  MSK c/o today. MSK: Detailed in the HPI GI: tolerating PO intake without difficulty Neuro: No numbness, parasthesias, or tingling associated. Otherwise the pertinent positives of the ROS are noted above.   Objective:   Physical Examination: BP 106/80  Pulse 90  Temp(Src) 98.5 F (36.9 C) (Oral)  Ht 5\' 4"  (1.626 m)  Wt 185 lb (83.915 kg)  BMI 31.74 kg/m2  Ideal Body Weight: Weight in (lb) to have BMI = 25: 145.3   GEN: WDWN, NAD, Non-toxic, Alert & Oriented x 3 HEENT: Atraumatic, Normocephalic.  Ears and Nose: No external deformity. EXTR: No clubbing/cyanosis/edema NEURO: Normal gait.  PSYCH: Normally interactive. Conversant. Not depressed or anxious appearing.  Calm demeanor.    Entirety of the patient's foot throughout the forefoot midfoot and hindfoot and all bony anatomy is nontender. There is no  significant tenderness around the base of her 5th metatarsal. There is no bruising and no swelling.  Dg Foot Complete Right  11/08/2013   CLINICAL DATA:  History of previous right fifth metatarsal base fracture.  EXAM: RIGHT FOOT COMPLETE - 3+ VIEW  COMPARISON:  None.  FINDINGS: There is an obliquely oriented fracture through the base of the fifth metatarsal. There is distraction of the fracture fragments by approximately 2 mm. There is no significant periosteal reaction or evidence of bony bridging. The 1st through 4th metatarsals appear intact. The phalanges are grossly intact. The bones of the hindfoot exhibit no acute abnormality.  IMPRESSION: There is a nonunited fracture of the base of the fifth metatarsal. I have no previous films with which to compare.   Electronically Signed   By: David  Martinique   On: 11/08/2013 15:42   Dg Foot Complete Right  12/21/2013   CLINICAL DATA:  Followup fracture  EXAM: RIGHT FOOT COMPLETE - 3+ VIEW  COMPARISON:  11/08/2013  FINDINGS: Fracture of the proximal fifth metacarpal shows interval partial healing with sclerosis. No significant displacement.  No other fracture is identified.  IMPRESSION: Partial interval healing of fifth metatarsal fracture.   Electronically Signed   By: Franchot Gallo M.D.   On: 12/21/2013 14:36   Assessment & Plan:    Closed fracture of metatarsal bone(s) - Plan: DG Foot Complete Right  Slight degree of healing. She feels quite well, and has begun to use a stiff, comfortable shoe on her own. She is limited more by her RA. We discussed potential options including casting and full CAM immobilization, surgical consultation, and she and I agreed with her PMH, RA, and current symptoms, she likely will be ok if comfortable shoes from this point further. She declined cast, CAM, surgical consultation.   There are no Patient Instructions on file for this visit.  Orders Placed This Encounter  Procedures  . DG Foot Complete Right    New  medications, updates to list, dose adjustments: No orders of the defined types were placed in this encounter.    Signed,  Maud Deed. Sylvana Bonk, MD, [place] [facility] [place] [address] Phone: [phone] Fax: [phone]    Medication List       This list is accurate as of: 12/21/13 11:59 PM.  Always use your most recent med list.               CELEBREX 200 MG capsule  Generic drug:  celecoxib  Take 200-400 mg by mouth daily.     cyclobenzaprine 10 MG tablet  Commonly known as:  FLEXERIL  Take 10 mg by mouth 3 (three) times daily as needed.     ENBREL SURECLICK 50 MG/ML injection  Generic drug:  etanercept  Inject 50 mg into the skin once a week.     FLUoxetine 10 MG capsule  Commonly known as:  PROZAC  TAKE ONE CAPSULE BY MOUTH EVERY DAY     folic acid 1 MG tablet  Commonly known as:  FOLVITE  Take 2 mg by mouth daily.     levothyroxine 112 MCG tablet  Commonly known as:  SYNTHROID, LEVOTHROID  Take 1 tablet (112 mcg total) by mouth daily.     LORazepam 1 MG tablet  Commonly known as:  ATIVAN  Take 1 tablet (1 mg total) by mouth daily as needed for anxiety.     methotrexate 2.5 MG tablet  Commonly known as:  RHEUMATREX  Take 17.5 mg by mouth once a week.     traMADol 50 MG tablet  Commonly known as:  ULTRAM  Take 50 mg by mouth every 6 (six) hours as needed.

## 2013-12-21 NOTE — Progress Notes (Signed)
Pre visit review using our clinic review tool, if applicable. No additional management support is needed unless otherwise documented below in the visit note. 

## 2014-01-08 ENCOUNTER — Telehealth: Payer: Self-pay | Admitting: Family Medicine

## 2014-01-08 ENCOUNTER — Encounter: Payer: Self-pay | Admitting: Family Medicine

## 2014-01-08 DIAGNOSIS — M069 Rheumatoid arthritis, unspecified: Secondary | ICD-10-CM

## 2014-01-08 NOTE — Telephone Encounter (Signed)
Pt asked for ref to rheum via mychart so she can continue seeing Dr Garen Grams for her RA

## 2014-01-22 ENCOUNTER — Other Ambulatory Visit: Payer: Self-pay | Admitting: Family Medicine

## 2014-05-17 ENCOUNTER — Other Ambulatory Visit: Payer: Self-pay | Admitting: Family Medicine

## 2014-05-17 NOTE — Telephone Encounter (Signed)
Please refill for 6 mo 

## 2014-05-17 NOTE — Telephone Encounter (Signed)
done

## 2014-05-17 NOTE — Telephone Encounter (Signed)
Electronic refill request, no recent/future appt., please advise  

## 2014-07-13 ENCOUNTER — Encounter: Payer: Self-pay | Admitting: Family Medicine

## 2014-07-13 ENCOUNTER — Ambulatory Visit (INDEPENDENT_AMBULATORY_CARE_PROVIDER_SITE_OTHER): Payer: Medicare HMO | Admitting: Family Medicine

## 2014-07-13 VITALS — BP 128/68 | HR 84 | Temp 98.1°F | Ht 64.0 in | Wt 185.2 lb

## 2014-07-13 DIAGNOSIS — L259 Unspecified contact dermatitis, unspecified cause: Secondary | ICD-10-CM

## 2014-07-13 DIAGNOSIS — E039 Hypothyroidism, unspecified: Secondary | ICD-10-CM

## 2014-07-13 DIAGNOSIS — Z23 Encounter for immunization: Secondary | ICD-10-CM

## 2014-07-13 DIAGNOSIS — L309 Dermatitis, unspecified: Secondary | ICD-10-CM

## 2014-07-13 MED ORDER — MOMETASONE FUROATE 0.1 % EX CREA: 1.0000 "application " | TOPICAL_CREAM | Freq: Every day | CUTANEOUS | Status: DC

## 2014-07-13 NOTE — Progress Notes (Signed)
Pre visit review using our clinic review tool, if applicable. No additional management support is needed unless otherwise documented below in the visit note. 

## 2014-07-13 NOTE — Progress Notes (Signed)
Subjective:    Patient ID: Samantha Valentine, female    DOB: 1946-09-11, 68 y.o.   MRN: [id number]  HPI Here for f/u of chronic medical problems   She is developing a rash on her arms / forearms/anticubital areas  Some spots on her legs  Going on for months  She tried to avoid exposures/ pool water  Held celebrex 4 d -no difference   Went to her RA appt with Dr Mena Goes rel to her RA  Wants her to see derm  She is holding her enbrel  She saw Dr Ronnald Ramp in the past   Hypothyroid  Lab Results  Component Value Date   TSH 2.15 07/05/2013   she will have routine draw in several mo at RA Hypothyroidism  Pt has no clinical changes No change in energy level/ hair or skin/ edema and no tremor   No longer on prozac Cough better off mtx   Patient Active Problem List   Diagnosis Date Noted  . Acute bronchitis 10/25/2013  . Cellulitis and abscess of face 06/26/2013  . Adverse effect of immunosuppressive drug 04/27/2012  . ANXIETY DEPRESSION 10/28/2008  . BACK PAIN 12/29/2007  . HYPOTHYROIDISM 12/28/2007  . VITAMIN D DEFICIENCY 12/28/2007  . HEARING LOSS 12/28/2007  . RHEUMATOID ARTHRITIS 12/28/2007  . [place] DISEASE 12/28/2007  . FIBROMYALGIA 12/28/2007  . OSTEOPOROSIS 12/28/2007  . MIGRAINES, HX OF 12/28/2007   Past Medical History  Diagnosis Date  . Depression   . Hypothyroidism   . Osteoporosis   . Arthritis     RA  . DDD (degenerative disc disease)     in neck  . Migraine    Past Surgical History  Procedure Laterality Date  . Tonsillectomy    . Lithotripsy      kidney stone  . Joint replacement      total hip replacement   History  Substance Use Topics  . Smoking status: Former Smoker    Quit date: 10/20/1983  . Smokeless tobacco: Never Used  . Alcohol Use: No   Family History  Problem Relation Age of Onset  . Hypertension Mother   . Cancer Mother     endometrial CA  . Alcohol abuse Father   . Diabetes Father   . Stroke Father     Allergies  Allergen Reactions  . Hydroxychloroquine Sulfate     REACTION: rash  . Ibandronate Sodium     REACTION: rash  . Risedronate Sodium     REACTION: rash   Current Outpatient Prescriptions on File Prior to Visit  Medication Sig Dispense Refill  . CELEBREX 200 MG capsule Take 200-400 mg by mouth daily.       . cyclobenzaprine (FLEXERIL) 10 MG tablet Take 10 mg by mouth 3 (three) times daily as needed.       Scarlette Shorts SURECLICK 50 MG/ML injection Inject 50 mg into the skin once a week.       . levothyroxine (SYNTHROID, LEVOTHROID) 112 MCG tablet TAKE ONE TABLET BY MOUTH ONCE DAILY  30 tablet  5  . LORazepam (ATIVAN) 1 MG tablet Take 1 tablet (1 mg total) by mouth daily as needed for anxiety.  30 tablet  1  . traMADol (ULTRAM) 50 MG tablet Take 50 mg by mouth every 6 (six) hours as needed.         No current facility-administered medications on file prior to visit.      Review of Systems    Review of Systems  Constitutional: Negative for fever, appetite change, fatigue and unexpected weight change.  Eyes: Negative for pain and visual disturbance.  Respiratory: Negative for cough and shortness of breath.   Cardiovascular: Negative for cp or palpitations    Gastrointestinal: Negative for nausea, diarrhea and constipation.  Genitourinary: Negative for urgency and frequency.  Skin: Negative for pallor and pos for dry/itchy rash MSK pos for joint pain from RA Neurological: Negative for weakness, light-headedness, numbness and headaches.  Hematological: Negative for adenopathy. Does not bruise/bleed easily.  Psychiatric/Behavioral: Negative for dysphoric mood. The patient is not nervous/anxious.      Objective:   Physical Exam  Constitutional: She appears well-developed and well-nourished. No distress.  obese and well appearing   HENT:  Head: Normocephalic and atraumatic.  Mouth/Throat: Oropharynx is clear and moist.  Eyes: Conjunctivae and EOM are normal. Pupils are  equal, round, and reactive to light. Right eye exhibits no discharge. Left eye exhibits no discharge. No scleral icterus.  Neck: Normal range of motion. Neck supple. No JVD present. Carotid bruit is not present. No thyromegaly present.  Cardiovascular: Normal rate, regular rhythm, normal heart sounds and intact distal pulses.  Exam reveals no gallop.   Pulmonary/Chest: Breath sounds normal. No respiratory distress. She has no wheezes. She has no rales.  Abdominal: Soft. Bowel sounds are normal.  Musculoskeletal: She exhibits no edema.  Lymphadenopathy:    She has no cervical adenopathy.  Neurological: She is alert. She has normal reflexes. No cranial nerve deficit. She exhibits normal muscle tone. Coordination normal.  No tremor   Skin: Skin is warm and dry. Rash noted. There is erythema.  Patchy erythematous rash that is dry with scale on arms (esp antecubital fossa areas) and upper legs    Psychiatric: She has a normal mood and affect.          Assessment & Plan:   Problem List Items Addressed This Visit     Endocrine   HYPOTHYROIDISM     Will add tsh to next labs  No clinical changes        Musculoskeletal and Integument   Eczema - Primary     Rash resembling eczema on arms and upper legs tx with elocon cream / moisturizers Avoidance of hot water and detergents Update if not imp -will ref to derm      Other Visit Diagnoses   Need for prophylactic vaccination and inoculation against influenza        Relevant Orders       Flu Vaccine QUAD 36+ mos PF IM (Fluarix Quad PF) (Completed)

## 2014-07-13 NOTE — Patient Instructions (Signed)
Get thyroid labs with next labs  Try elocon cream (generic) on your rash once daily  Use color and scent free moisturizer  Use dove soap for sensitive skin  No hot water  Avoid chemicals in general

## 2014-07-14 NOTE — Assessment & Plan Note (Signed)
Will add tsh to next labs  No clinical changes

## 2014-07-15 NOTE — Assessment & Plan Note (Signed)
Rash resembling eczema on arms and upper legs tx with elocon cream / moisturizers Avoidance of hot water and detergents Update if not imp -will ref to derm

## 2014-08-03 ENCOUNTER — Encounter: Payer: Self-pay | Admitting: Family Medicine

## 2014-08-05 ENCOUNTER — Encounter: Payer: Self-pay | Admitting: Family Medicine

## 2014-08-06 ENCOUNTER — Telehealth: Payer: Self-pay | Admitting: Family Medicine

## 2014-08-06 MED ORDER — LEVOTHYROXINE SODIUM 150 MCG PO TABS: 150.0000 ug | ORAL_TABLET | Freq: Every day | ORAL | Status: DC

## 2014-08-06 NOTE — Telephone Encounter (Signed)
Increase synthroid

## 2014-09-18 ENCOUNTER — Encounter: Payer: Self-pay | Admitting: Family Medicine

## 2014-09-19 ENCOUNTER — Other Ambulatory Visit: Payer: Self-pay | Admitting: Family Medicine

## 2014-09-19 DIAGNOSIS — E039 Hypothyroidism, unspecified: Secondary | ICD-10-CM

## 2014-09-24 ENCOUNTER — Encounter: Payer: Self-pay | Admitting: Family Medicine

## 2014-09-24 ENCOUNTER — Ambulatory Visit (INDEPENDENT_AMBULATORY_CARE_PROVIDER_SITE_OTHER): Payer: Commercial Managed Care - HMO | Admitting: Family Medicine

## 2014-09-24 VITALS — BP 132/84 | HR 95 | Temp 98.2°F | Ht 64.0 in | Wt 181.5 lb

## 2014-09-24 DIAGNOSIS — J209 Acute bronchitis, unspecified: Secondary | ICD-10-CM | POA: Insufficient documentation

## 2014-09-24 MED ORDER — AZITHROMYCIN 250 MG PO TABS: ORAL_TABLET | ORAL | Status: DC

## 2014-09-24 NOTE — Progress Notes (Signed)
Subjective:    Patient ID: Samantha Valentine, female    DOB: 26-Sep-1946, 68 y.o.   MRN: [id number]  HPI Here with uri symptoms  Started on Monday  Worried because she is on immunosuppression and had a low grade fever   She went ahead and took 500 mg twice daily (she had for dental procedure)   Bad cough- a little production of dark sputum Yellow nasal drainage also  Sinuses are full and uncomfortable  Ears are sore  Throat- a little raw from mouth breathing   No fever now  Taking guifenesin otc 600 mg ER bid   Patient Active Problem List   Diagnosis Date Noted  . Eczema 07/13/2014  . Cellulitis and abscess of face 06/26/2013  . Adverse effect of immunosuppressive drug 04/27/2012  . ANXIETY DEPRESSION 10/28/2008  . BACK PAIN 12/29/2007  . HYPOTHYROIDISM 12/28/2007  . VITAMIN D DEFICIENCY 12/28/2007  . HEARING LOSS 12/28/2007  . RHEUMATOID ARTHRITIS 12/28/2007  . [place] DISEASE 12/28/2007  . FIBROMYALGIA 12/28/2007  . OSTEOPOROSIS 12/28/2007  . MIGRAINES, HX OF 12/28/2007   Past Medical History  Diagnosis Date  . Depression   . Hypothyroidism   . Osteoporosis   . Arthritis     RA  . DDD (degenerative disc disease)     in neck  . Migraine    Past Surgical History  Procedure Laterality Date  . Tonsillectomy    . Lithotripsy      kidney stone  . Joint replacement      total hip replacement   History  Substance Use Topics  . Smoking status: Former Smoker    Quit date: 10/20/1983  . Smokeless tobacco: Never Used  . Alcohol Use: No   Family History  Problem Relation Age of Onset  . Hypertension Mother   . Cancer Mother     endometrial CA  . Alcohol abuse Father   . Diabetes Father   . Stroke Father    Allergies  Allergen Reactions  . Hydroxychloroquine Sulfate     REACTION: rash  . Ibandronate Sodium     REACTION: rash  . Risedronate Sodium     REACTION: rash   Current Outpatient Prescriptions on File Prior to Visit  Medication Sig  Dispense Refill  . CELEBREX 200 MG capsule Take 200-400 mg by mouth daily.     . cyclobenzaprine (FLEXERIL) 10 MG tablet Take 10 mg by mouth 3 (three) times daily as needed.     Scarlette Shorts SURECLICK 50 MG/ML injection Inject 50 mg into the skin once a week.     . levothyroxine (SYNTHROID, LEVOTHROID) 150 MCG tablet Take 1 tablet (150 mcg total) by mouth daily. 30 tablet 3  . LORazepam (ATIVAN) 1 MG tablet Take 1 tablet (1 mg total) by mouth daily as needed for anxiety. 30 tablet 1  . mometasone (ELOCON) 0.1 % cream Apply 1 application topically daily. To affected areas 45 g 3  . traMADol (ULTRAM) 50 MG tablet Take 50 mg by mouth every 6 (six) hours as needed.       No current facility-administered medications on file prior to visit.    Review of Systems Review of Systems  Constitutional: Negative for fever, appetite change,  and unexpected weight change. pos for fatigue and malaise  ENT pos for cong and rhinorrhea and sinus pressure  Eyes: Negative for pain and visual disturbance.  Respiratory: Negative for wheeze  and shortness of breath.   Cardiovascular: Negative for cp or  palpitations    Gastrointestinal: Negative for nausea, diarrhea and constipation.  Genitourinary: Negative for urgency and frequency.  Skin: Negative for pallor or rash   Neurological: Negative for weakness, light-headedness, numbness and headaches.  Hematological: Negative for adenopathy. Does not bruise/bleed easily.  Psychiatric/Behavioral: Negative for dysphoric mood. The patient is not nervous/anxious.         Objective:   Physical Exam  Constitutional: She appears well-developed and well-nourished. No distress.  obese and well appearing   HENT:  Head: Normocephalic and atraumatic.  Right Ear: External ear normal.  Left Ear: External ear normal.  Mouth/Throat: Oropharynx is clear and moist. No oropharyngeal exudate.  Nares are injected and congested  Mild maxillary sinus tenderness Throat -post nasal  drip   Eyes: Conjunctivae and EOM are normal. Pupils are equal, round, and reactive to light. Right eye exhibits no discharge. Left eye exhibits no discharge.  Neck: Normal range of motion. Neck supple.  Cardiovascular: Normal rate and regular rhythm.   Pulmonary/Chest: Effort normal and breath sounds normal. No respiratory distress. She has no wheezes. She has no rales. She exhibits no tenderness.  Upper airway sounds noted Few scant rhonchi No rales or wheeze Harsh bs with good air exch  Lymphadenopathy:    She has no cervical adenopathy.  Neurological: She is alert.  Skin: Skin is warm and dry. No rash noted.  Psychiatric: She has a normal mood and affect.          Assessment & Plan:   Problem List Items Addressed This Visit      Respiratory   Acute bronchitis - Primary    In high risk pt on immunosuppressive tx  Cover with zpak empirically Disc symptomatic care - see instructions on AVS  Update if not starting to improve in a week or if worsening

## 2014-09-24 NOTE — Progress Notes (Signed)
Pre visit review using our clinic review tool, if applicable. No additional management support is needed unless otherwise documented below in the visit note. 

## 2014-09-24 NOTE — Assessment & Plan Note (Signed)
In high risk pt on immunosuppressive tx  Cover with zpak empirically Disc symptomatic care - see instructions on AVS  Update if not starting to improve in a week or if worsening

## 2014-09-24 NOTE — Patient Instructions (Signed)
I think you have upper respiratory infection/ early bronchitis Drink lots of fluids and rest  Continue guifenesin  Take the zithromax as directed   Update if not starting to improve in a week or if worsening  =especially if fever or short of breath

## 2014-10-03 ENCOUNTER — Telehealth: Payer: Self-pay | Admitting: Family Medicine

## 2014-10-03 ENCOUNTER — Encounter: Payer: Self-pay | Admitting: Family Medicine

## 2014-10-03 LAB — TSH: TSH: 0.421 u[IU]/mL — AB (ref 0.450–4.500)

## 2014-10-03 MED ORDER — LEVOTHYROXINE SODIUM 125 MCG PO TABS: 125.0000 ug | ORAL_TABLET | Freq: Every day | ORAL | Status: DC

## 2014-10-03 NOTE — Telephone Encounter (Signed)
Change thyroid dose to 125 mcg

## 2014-12-18 ENCOUNTER — Encounter: Payer: Self-pay | Admitting: Family Medicine

## 2014-12-18 NOTE — Telephone Encounter (Signed)
Done. Mail out.

## 2014-12-25 ENCOUNTER — Other Ambulatory Visit: Payer: Self-pay | Admitting: Family Medicine

## 2014-12-25 LAB — TSH: TSH: 2.455 u[IU]/mL (ref 0.350–4.500)

## 2014-12-31 ENCOUNTER — Telehealth: Payer: Self-pay

## 2014-12-31 NOTE — Telephone Encounter (Signed)
Pt left v/m; pt had labs at [facility] in [place] on 12/25/14; [facility] office called pt on 12/26/14 with lab results but pt has not heard from Dr Glori Bickers about TSH results. Pt request cb.

## 2014-12-31 NOTE — Telephone Encounter (Signed)
tsh is ok - at 2.455 Let me know if any thyroid symptoms  I would not change anything

## 2014-12-31 NOTE — Telephone Encounter (Signed)
Lab results requested from [facility], on desk to review.

## 2014-12-31 NOTE — Telephone Encounter (Signed)
I do not see results in the chart -we had mailed her the order Did we get the result?

## 2015-01-01 NOTE — Telephone Encounter (Signed)
Patient informed of TSH results and recommendation.

## 2015-01-04 ENCOUNTER — Encounter: Payer: Self-pay | Admitting: Family Medicine

## 2015-02-08 ENCOUNTER — Encounter: Payer: Medicare HMO | Admitting: Family Medicine

## 2015-02-19 ENCOUNTER — Encounter: Payer: Medicare HMO | Admitting: Family Medicine

## 2015-04-01 ENCOUNTER — Other Ambulatory Visit: Payer: Self-pay | Admitting: Family Medicine

## 2015-04-01 NOTE — Telephone Encounter (Signed)
Electronic refill request, pt has had a few acute visits but last med refill OV was a on 07/13/14. Pt cancelled her medicare wellness that was scheduled on 02/18/14 and the reason was "Received the paperwork and don't see this as something I need to do". No future appt scheduled, please advise

## 2015-04-01 NOTE — Telephone Encounter (Signed)
Refill sent to pharmacy as instructed. 

## 2015-04-01 NOTE — Telephone Encounter (Signed)
We watch her thyroid and she gets labs outside the office  Please refill for a year  No problem if she does not want PEs from my perspective

## 2015-04-15 ENCOUNTER — Other Ambulatory Visit: Payer: Self-pay

## 2015-04-19 ENCOUNTER — Ambulatory Visit (INDEPENDENT_AMBULATORY_CARE_PROVIDER_SITE_OTHER)
Admission: RE | Admit: 2015-04-19 | Discharge: 2015-04-19 | Disposition: A | Discharge status: Home / Self Care | Payer: PPO | Source: Ambulatory Visit | Attending: Family Medicine | Admitting: Family Medicine

## 2015-04-19 ENCOUNTER — Encounter: Payer: Self-pay | Admitting: Family Medicine

## 2015-04-19 ENCOUNTER — Ambulatory Visit (INDEPENDENT_AMBULATORY_CARE_PROVIDER_SITE_OTHER): Payer: PPO | Admitting: Family Medicine

## 2015-04-19 VITALS — BP 130/78 | HR 88 | Temp 98.0°F | Ht 64.0 in | Wt 180.5 lb

## 2015-04-19 DIAGNOSIS — M25562 Pain in left knee: Secondary | ICD-10-CM | POA: Diagnosis not present

## 2015-04-19 NOTE — Patient Instructions (Signed)
For better patellar tracking and also support- get a neoprene knee sleeve with a hole in it for the patella Ice ice when able Elevate when able Cane if needed - relative rest  Xray now  Continue celebrex

## 2015-04-19 NOTE — Progress Notes (Signed)
Subjective:    Patient ID: Samantha Valentine, female    DOB: 1946/04/17, 69 y.o.   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 Acute bronchitis 09/24/2014  . Eczema 07/13/2014  . Cellulitis and abscess of face 06/26/2013  . Adverse effect of immunosuppressive drug 04/27/2012  . ANXIETY DEPRESSION 10/28/2008  . BACK PAIN 12/29/2007  . HYPOTHYROIDISM 12/28/2007  . VITAMIN D DEFICIENCY 12/28/2007  . HEARING LOSS 12/28/2007  . RHEUMATOID ARTHRITIS 12/28/2007  . [place] DISEASE 12/28/2007  . FIBROMYALGIA 12/28/2007  . OSTEOPOROSIS 12/28/2007  . MIGRAINES, HX OF 12/28/2007   Past Medical History  Diagnosis Date  . Depression   . Hypothyroidism   . Osteoporosis   . Arthritis     RA  . DDD (degenerative disc disease)     in neck  . Migraine    Past Surgical History  Procedure Laterality Date  . Tonsillectomy    . Lithotripsy      kidney stone  . Joint replacement      total hip replacement   History  Substance Use Topics  . Smoking status: Former Smoker    Quit date: 10/20/1983  . Smokeless tobacco: Never Used  . Alcohol Use: No   Family History  Problem Relation Age of Onset  . Hypertension Mother   . Cancer Mother     endometrial CA  . Alcohol abuse Father   . Diabetes Father   . Stroke Father    Allergies  Allergen Reactions  . Hydroxychloroquine Sulfate     REACTION: rash  . Ibandronate Sodium     REACTION: rash  . Risedronate Sodium    REACTION: rash   Current Outpatient Prescriptions on File Prior to Visit  Medication Sig Dispense Refill  . CELEBREX 200 MG capsule Take 200-400 mg by mouth daily.     . cyclobenzaprine (FLEXERIL) 10 MG tablet Take 10 mg by mouth 3 (three) times daily as needed.     Scarlette Shorts SURECLICK 50 MG/ML injection Inject 50 mg into the skin once a week.     . levothyroxine (SYNTHROID, LEVOTHROID) 125 MCG tablet TAKE ONE TABLET BY MOUTH ONCE DAILY 30 tablet 11  . LORazepam (ATIVAN) 1 MG tablet Take 1 tablet (1 mg total) by mouth daily as needed for anxiety. 30 tablet 1  . mometasone (ELOCON) 0.1 % cream Apply 1 application topically daily. To affected areas 45 g 3   No current facility-administered medications on file prior to visit.      Review of Systems Review of Systems  Constitutional: Negative for fever, appetite change, fatigue and unexpected weight change.  Eyes: Negative for pain and visual disturbance.  Respiratory: Negative for cough and shortness of breath.   Cardiovascular: Negative for cp or palpitations    Gastrointestinal: Negative for nausea, diarrhea and constipation.  Genitourinary: Negative for urgency and frequency.  Skin: Negative for pallor or rash   MSK pos for joint pain, pos for L knee pain and feeling of instability Neurological: Negative for weakness, light-headedness, numbness and headaches.  Hematological: Negative for adenopathy. Does not bruise/bleed easily.  Psychiatric/Behavioral: Negative for dysphoric mood. The patient is not nervous/anxious.         Objective:   Physical Exam  Constitutional: She appears well-developed and well-nourished. No distress.  obese and well appearing   HENT:  Head: Normocephalic and atraumatic.  Eyes: Conjunctivae and EOM are normal. Pupils are equal, round, and reactive to light.  Neck: Normal range of motion. Neck supple.  Cardiovascular: Normal rate and regular rhythm.   Musculoskeletal:       Left knee: She exhibits  decreased range of motion and abnormal patellar mobility. She exhibits no swelling, no effusion, no ecchymosis, no deformity, no erythema, normal alignment, no LCL laxity and no bony tenderness. Tenderness found. Medial joint line and patellar tendon tenderness noted.  Some tenderness pos to knee w/o swelling Flex 30-50 deg Ext nl  Some crepitus Pos mc murray test  Stable on ant drawer and lachman  Lymphadenopathy:    She has no cervical adenopathy.  Neurological: She is alert. She has normal reflexes. She displays no atrophy. She exhibits normal muscle tone. Coordination normal.  Skin: Skin is warm and dry. No erythema.  Psychiatric: She has a normal mood and affect.          Assessment & Plan:   Problem List Items Addressed This Visit    Left knee pain - Primary    Likely multifactorial in obese female with OA and poss old meniscal inj Disc chondromalacia-handout given and stretches/exercises  Ice/elevate/cane Xray now Continue celebrex with food Update       Relevant Orders   DG Knee Complete 4 Views Left (Completed)

## 2015-04-19 NOTE — Progress Notes (Signed)
Pre visit review using our clinic review tool, if applicable. No additional management support is needed unless otherwise documented below in the visit note. 

## 2015-04-19 NOTE — Assessment & Plan Note (Signed)
Likely multifactorial in obese female with OA and poss old meniscal inj Disc chondromalacia-handout given and stretches/exercises  Ice/elevate/cane Xray now Continue celebrex with food Update

## 2015-05-09 ENCOUNTER — Encounter: Payer: Self-pay | Admitting: Family Medicine

## 2015-05-09 ENCOUNTER — Telehealth: Payer: Self-pay

## 2015-05-09 NOTE — Telephone Encounter (Signed)
Called patient to notify her of being due for a mammogram. Patient declined any help scheduling one at the moment. 

## 2015-08-20 ENCOUNTER — Encounter: Payer: Self-pay | Admitting: Family Medicine

## 2015-08-20 ENCOUNTER — Ambulatory Visit (INDEPENDENT_AMBULATORY_CARE_PROVIDER_SITE_OTHER): Payer: PPO | Admitting: Family Medicine

## 2015-08-20 VITALS — BP 128/82 | HR 80 | Temp 97.5°F | Ht 64.0 in | Wt 183.0 lb

## 2015-08-20 DIAGNOSIS — Z23 Encounter for immunization: Secondary | ICD-10-CM | POA: Diagnosis not present

## 2015-08-20 MED ORDER — LORAZEPAM 1 MG PO TABS: 1.0000 mg | ORAL_TABLET | Freq: Every day | ORAL | Status: DC | PRN

## 2015-08-20 NOTE — Patient Instructions (Signed)
Flu shot and prevnar today   Take care of yourself   We refilled ativan

## 2015-08-20 NOTE — Progress Notes (Signed)
Pre visit review using our clinic review tool, if applicable. No additional management support is needed unless otherwise documented below in the visit note. 

## 2015-08-20 NOTE — Progress Notes (Signed)
   Subjective:    Patient ID: Samantha Valentine, female    DOB: Oct 25, 1945, 69 y.o.   MRN: [id number]  HPI Pt is here for prevnar and flu vaccines   Does not think she needs hep C screening - not high risk   Had Pneumovax 23 in 2014   On enbrel  Wants to get up to date   Needs renewal of ativan - last refill 2014- uses very rarely - uses when she goes to the dentist       Review of Systems     Objective:   Physical Exam        Assessment & Plan:

## 2015-08-20 NOTE — Assessment & Plan Note (Signed)
Flu shot and prevnar today. 

## 2015-11-25 DIAGNOSIS — H2513 Age-related nuclear cataract, bilateral: Secondary | ICD-10-CM | POA: Diagnosis not present

## 2015-12-10 DIAGNOSIS — M0579 Rheumatoid arthritis with rheumatoid factor of multiple sites without organ or systems involvement: Secondary | ICD-10-CM | POA: Diagnosis not present

## 2015-12-10 DIAGNOSIS — M25561 Pain in right knee: Secondary | ICD-10-CM | POA: Diagnosis not present

## 2015-12-10 DIAGNOSIS — Z09 Encounter for follow-up examination after completed treatment for conditions other than malignant neoplasm: Secondary | ICD-10-CM | POA: Diagnosis not present

## 2015-12-10 DIAGNOSIS — M797 Fibromyalgia: Secondary | ICD-10-CM | POA: Diagnosis not present

## 2015-12-26 DIAGNOSIS — Z79899 Other long term (current) drug therapy: Secondary | ICD-10-CM | POA: Diagnosis not present

## 2015-12-30 ENCOUNTER — Ambulatory Visit (INDEPENDENT_AMBULATORY_CARE_PROVIDER_SITE_OTHER): Payer: PPO | Admitting: Obstetrics and Gynecology

## 2015-12-30 ENCOUNTER — Encounter: Payer: Self-pay | Admitting: Obstetrics and Gynecology

## 2015-12-30 VITALS — BP 138/91 | HR 97 | Resp 16 | Ht 64.0 in | Wt 182.7 lb

## 2015-12-30 DIAGNOSIS — R310 Gross hematuria: Secondary | ICD-10-CM

## 2015-12-30 DIAGNOSIS — R319 Hematuria, unspecified: Secondary | ICD-10-CM | POA: Diagnosis not present

## 2015-12-30 LAB — URINALYSIS, COMPLETE
Bilirubin, UA: NEGATIVE
Glucose, UA: NEGATIVE
Ketones, UA: NEGATIVE
LEUKOCYTES UA: NEGATIVE
Nitrite, UA: NEGATIVE
Protein, UA: NEGATIVE
Specific Gravity, UA: <1.005 — ABNORMAL LOW (ref 1.005–1.030)
Urobilinogen, Ur: 0.2 mg/dL (ref 0.2–1.0)
pH, UA: 5.5 (ref 5.0–7.5)

## 2015-12-30 LAB — MICROSCOPIC EXAMINATION: BACTERIA UA: NONE SEEN

## 2015-12-30 NOTE — Progress Notes (Signed)
12/30/2015 11:18 AM   Samantha Valentine 1945-11-23 [id number]  Referring provider: Abner Greenspan, MD [address] [place], [place] [address]  Chief Complaint  Patient presents with  . Hematuria  . Establish Care    HPI: Patient is a 70 year old female with a history of kidney stones presenting today with complaints of painless gross hematuria 2 days. She reports some associated symptoms of very mild urgency and lower  back discomfort.  She denies fevers, nausea or vomiting. No dysuria or flank pain.  History of stones but no prior gross hematuria.  ESWL in 1996. Otherwise passed all stones with medical management.  Previous records management at [facility].  Former smoker, quit 1985 2ppd x 30 years.    Methotrexate use in the past for rhuematoid arthritis.  No vaginal symptoms except for some dryness.    PMH: Past Medical History  Diagnosis Date  . Depression   . Hypothyroidism   . Osteoporosis   . Arthritis     RA  . DDD (degenerative disc disease)     in neck  . Migraine     Surgical History: Past Surgical History  Procedure Laterality Date  . Tonsillectomy    . Lithotripsy      kidney stone  . Joint replacement      total hip replacement    Home Medications:    Medication List       This list is accurate as of: 12/30/15 11:18 AM.  Always use your most recent med list.               CELEBREX 200 MG capsule  Generic drug:  celecoxib  Take 200-400 mg by mouth daily.     cyclobenzaprine 10 MG tablet  Commonly known as:  FLEXERIL  Take 10 mg by mouth 3 (three) times daily as needed.     ENBREL SURECLICK 50 MG/ML injection  Generic drug:  etanercept  Inject 50 mg into the skin once a week.     levothyroxine 125 MCG tablet  Commonly known as:  SYNTHROID, LEVOTHROID  TAKE ONE TABLET BY MOUTH ONCE DAILY     LORazepam 1 MG tablet  Commonly known as:  ATIVAN  Take 1 tablet (1 mg total) by mouth daily as needed  for anxiety.     mometasone 0.1 % cream  Commonly known as:  ELOCON  Apply 1 application topically daily. To affected areas     multivitamin-lutein Caps capsule  Take 1 capsule by mouth daily.        Allergies:  Allergies  Allergen Reactions  . Hydroxychloroquine Sulfate     REACTION: rash  . Ibandronate Sodium     REACTION: rash  . Risedronate Sodium     REACTION: rash    Family History: Family History  Problem Relation Age of Onset  . Hypertension Mother   . Cancer Mother     endometrial CA  . Alcohol abuse Father   . Diabetes Father   . Stroke Father     Social History:  reports that she quit smoking about 32 years ago. She has never used smokeless tobacco. She reports that she does not drink alcohol or use illicit drugs.  ROS: UROLOGY Frequent Urination?: No Hard to postpone urination?: No Burning/pain with urination?: No Get up at night to urinate?: No Leakage of urine?: No Urine stream starts and stops?: No Trouble starting stream?: No Do you have to strain to urinate?: No  Blood in urine?: Yes Urinary tract infection?: No Sexually transmitted disease?: No Injury to kidneys or bladder?: No Painful intercourse?: No Weak stream?: No Currently pregnant?: No Vaginal bleeding?: No Last menstrual period?: n  Gastrointestinal Nausea?: No Vomiting?: No Indigestion/heartburn?: No Diarrhea?: No Constipation?: No  Constitutional Fever: No Night sweats?: No Weight loss?: No Fatigue?: No  Skin Skin rash/lesions?: Yes Itching?: No  Eyes Blurred vision?: Yes Double vision?: No  Ears/Nose/Throat Sore throat?: No Sinus problems?: No  Hematologic/Lymphatic Swollen glands?: No Easy bruising?: No  Cardiovascular Leg swelling?: No Chest pain?: No  Respiratory Cough?: No Shortness of breath?: No  Endocrine Excessive thirst?: No  Musculoskeletal Back pain?: Yes Joint pain?: Yes  Neurological Headaches?: No Dizziness?:  No  Psychologic Depression?: No Anxiety?: No  Physical Exam: BP 138/91 mmHg  Pulse 97  Resp 16  Ht 5\' 4"  (1.626 m)  Wt 182 lb 11.2 oz (82.872 kg)  BMI 31.34 kg/m2  Constitutional:  Alert and oriented, No acute distress. HEENT: [place] AT, moist mucus membranes.  Trachea midline, no masses. Cardiovascular: No clubbing, cyanosis, or edema. Respiratory: Normal respiratory effort, no increased work of breathing. GI: Abdomen is soft, nontender, nondistended, no abdominal masses GU: No CVA tenderness.  Skin: No rashes, bruises or suspicious lesions. Lymph: No cervical or inguinal adenopathy. Neurologic: Grossly intact, no focal deficits, moving all 4 extremities. Psychiatric: Normal mood and affect.  Laboratory Data:  Lab Results  Component Value Date   WBC 6.7 07/05/2013   HGB 13.9 07/05/2013   HCT 41.5 07/05/2013   MCV 92.4 07/05/2013   PLT 248.0 07/05/2013    Lab Results  Component Value Date   CREATININE 0.8 07/05/2013    No results found for: PSA  No results found for: TESTOSTERONE  No results found for: HGBA1C  Urinalysis No results found for: COLORURINE, APPEARANCEUR, LABSPEC, PHURINE, GLUCOSEU, HGBUR, BILIRUBINUR, KETONESUR, PROTEINUR, UROBILINOGEN, NITRITE, LEUKOCYTESUR  Pertinent Imaging:   Assessment & Plan:    1.  Gross Hematuria-  Painless gross hematuria 2 days. Resolved today. Former smoker. We discussed the differential diagnosis for microscopic hematuria including nephrolithiasis, renal or upper tract tumors, bladder stones, UTIs, or bladder tumors as well as undetermined etiologies. Per AUA guidelines, I did recommend complete microscopic hematuria evaluation including CTU, possible urine cytology, and office cystoscopy. - Urinalysis, Complete   Return for CT urogram results and cystoscopy .  These notes generated with voice recognition software. I apologize for typographical errors.  Herbert Moors, [facility] [address] [place], [place] [address] [phone]

## 2016-01-10 ENCOUNTER — Ambulatory Visit
Admission: RE | Admit: 2016-01-10 | Discharge: 2016-01-10 | Disposition: A | Discharge status: Home / Self Care | Payer: PPO | Source: Ambulatory Visit | Attending: Obstetrics and Gynecology | Admitting: Obstetrics and Gynecology

## 2016-01-10 ENCOUNTER — Telehealth: Payer: Self-pay

## 2016-01-10 DIAGNOSIS — N200 Calculus of kidney: Secondary | ICD-10-CM | POA: Diagnosis not present

## 2016-01-10 DIAGNOSIS — R310 Gross hematuria: Secondary | ICD-10-CM | POA: Diagnosis not present

## 2016-01-10 HISTORY — DX: Systemic involvement of connective tissue, unspecified: M35.9

## 2016-01-10 LAB — POCT I-STAT CREATININE: CREATININE: 0.6 mg/dL (ref 0.44–1.00)

## 2016-01-10 MED ORDER — IOPAMIDOL (ISOVUE-300) INJECTION 61%
125.0000 mL | Freq: Once | INTRAVENOUS | Status: AC | PRN
  Administered 2016-01-10: 125 mL via INTRAVENOUS

## 2016-01-10 NOTE — Telephone Encounter (Signed)
The I-STAT creatinine was normal and they did it prior to getting a CT Urogram.

## 2016-01-10 NOTE — Telephone Encounter (Signed)
Pt I-STAT creat has come through via "results". The route of getting [facility] providers results. I tried to fwd the message but I dont see that its going through. This is a [facility] pt.

## 2016-01-17 ENCOUNTER — Ambulatory Visit (INDEPENDENT_AMBULATORY_CARE_PROVIDER_SITE_OTHER): Payer: PPO | Admitting: Urology

## 2016-01-17 ENCOUNTER — Encounter: Payer: Self-pay | Admitting: Urology

## 2016-01-17 VITALS — BP 176/81 | HR 93 | Ht 64.0 in | Wt 185.0 lb

## 2016-01-17 DIAGNOSIS — R310 Gross hematuria: Secondary | ICD-10-CM | POA: Diagnosis not present

## 2016-01-17 DIAGNOSIS — N200 Calculus of kidney: Secondary | ICD-10-CM | POA: Diagnosis not present

## 2016-01-17 LAB — URINALYSIS, COMPLETE
BILIRUBIN UA: NEGATIVE
Glucose, UA: NEGATIVE
Leukocytes, UA: NEGATIVE
NITRITE UA: NEGATIVE
Protein, UA: NEGATIVE
Specific Gravity, UA: 1.02 (ref 1.005–1.030)
Urobilinogen, Ur: 0.2 mg/dL (ref 0.2–1.0)
pH, UA: 5 (ref 5.0–7.5)

## 2016-01-17 LAB — MICROSCOPIC EXAMINATION: RBC MICROSCOPIC, UA: NONE SEEN /HPF (ref 0–?)

## 2016-01-17 MED ORDER — CIPROFLOXACIN HCL 500 MG PO TABS
500.0000 mg | ORAL_TABLET | Freq: Once | ORAL | Status: AC
  Administered 2016-01-17: 500 mg via ORAL

## 2016-01-17 MED ORDER — LIDOCAINE HCL 2 % EX GEL
1.0000 "application " | Freq: Once | CUTANEOUS | Status: AC
  Administered 2016-01-17: 1 via URETHRAL

## 2016-01-17 NOTE — Progress Notes (Signed)
01/17/2016 9:00 PM   Samantha Valentine [birth year] [id number]  Referring provider: Abner Greenspan, MD [address] [place], [place] [address]  Chief Complaint  Patient presents with  . Cysto    HPI: 70 year old female with a history of kidney stones with episodes of painless gross hematuria who returns to the office today for cystoscopy and to review her CT urogram results as part of her hematuria workup.  CT urogram shows bilateral nonobstructing calculi, largest measuring 5.5 cm of the right renal pelvis without obstructing calculi or any other GU pathology.  History of stones but no prior gross hematuria.  ESWL in 1996. Otherwise passed all stones with medical management.  Denies recent flank pain.  Former smoker, quit 1985 2ppd x 30 years.   Methotrexate use in the past for rhuematoid arthritis.  PMH: Past Medical History  Diagnosis Date  . Depression   . Hypothyroidism   . Osteoporosis   . Arthritis     RA  . DDD (degenerative disc disease)     in neck  . Migraine   . Collagen vascular disease ([place])     Rhematoid Arthritis    Surgical History: Past Surgical History  Procedure Laterality Date  . Tonsillectomy    . Lithotripsy      kidney stone  . Joint replacement      total hip replacement    Home Medications:    Medication List       This list is accurate as of: 01/17/16  9:00 PM.  Always use your most recent med list.               CELEBREX 200 MG capsule  Generic drug:  celecoxib  Take 200-400 mg by mouth daily.     cyclobenzaprine 10 MG tablet  Commonly known as:  FLEXERIL  Take 10 mg by mouth 3 (three) times daily as needed.     ENBREL SURECLICK 50 MG/ML injection  Generic drug:  etanercept  Inject 50 mg into the skin once a week.     levothyroxine 125 MCG tablet  Commonly known as:  SYNTHROID, LEVOTHROID  TAKE ONE TABLET BY MOUTH ONCE DAILY     LORazepam 1 MG tablet  Commonly known as:  ATIVAN    Take 1 tablet (1 mg total) by mouth daily as needed for anxiety.     mometasone 0.1 % cream  Commonly known as:  ELOCON  Apply 1 application topically daily. To affected areas     multivitamin-lutein Caps capsule  Take 1 capsule by mouth daily.        Allergies:  Allergies  Allergen Reactions  . Hydroxychloroquine Sulfate     REACTION: rash  . Ibandronate Sodium     REACTION: rash  . Risedronate Sodium     REACTION: rash    Family History: Family History  Problem Relation Age of Onset  . Hypertension Mother   . Cancer Mother     endometrial CA  . Alcohol abuse Father   . Diabetes Father   . Stroke Father     Social History:  reports that she quit smoking about 32 years ago. She has never used smokeless tobacco. She reports that she does not drink alcohol or use illicit drugs.   Physical Exam: BP 176/81 mmHg  Pulse 93  Ht 5\' 4"  (1.626 m)  Wt 185 lb (83.915 kg)  BMI 31.74 kg/m2  Constitutional:  Alert and oriented, No acute  distress. HEENT: [place] AT, moist mucus membranes.  Trachea midline, no masses. Cardiovascular: No clubbing, cyanosis, or edema. Respiratory: Normal respiratory effort, no increased work of breathing. GI: Abdomen is soft, nontender, nondistended, no abdominal masses GU: No CVA tenderness. Normal external genitalia. Normal urethral meatus. Skin: No rashes, bruises or suspicious lesions. Lymph: No cervical or inguinal adenopathy. Neurologic: Grossly intact, no focal deficits, moving all 4 extremities. Psychiatric: Normal mood and affect.  Laboratory Data: Lab Results  Component Value Date   WBC 6.7 07/05/2013   HGB 13.9 07/05/2013   HCT 41.5 07/05/2013   MCV 92.4 07/05/2013   PLT 248.0 07/05/2013    Lab Results  Component Value Date   CREATININE 0.60 01/10/2016     Pertinent Imaging:    Study Result     CLINICAL DATA: Episode of gross hematuria on 12/26/2015 lasting 2 days. Microscopic hematuria noted also. Prior history of  renal calculi.  EXAM: CT ABDOMEN AND PELVIS WITHOUT AND WITH CONTRAST  TECHNIQUE: Multidetector CT imaging of the abdomen and pelvis was performed following the standard protocol before and following the bolus administration of intravenous contrast.  CONTRAST: 122mL ISOVUE-300 IOPAMIDOL (ISOVUE-300) INJECTION 61%  COMPARISON: None.  FINDINGS: Lower chest: The lung bases are clear of acute process. No pleural effusion or pulmonary lesions. The heart is normal in size. No pericardial effusion. The distal esophagus and aorta are unremarkable.  Hepatobiliary: No focal hepatic lesions or intrahepatic biliary dilatation. The gallbladder is normal. No common bile duct dilatation.  Pancreas: No mass, inflammation or ductal dilatation.  Spleen: Normal size. No focal lesions.  Adrenals/Urinary Tract: The adrenal glands are normal.  There are multiple bilateral renal calculi likely accounting for the patient's hematuria. The largest calculus in the right kidney measures 5.5 mm and is in the renal pelvis. No obstructing ureteral calculi or bladder calculi I. the bladder is partially obscured by artifact from a right hip prosthesis.  After contrast administration both kidneys demonstrate normal enhancement/ perfusion. No worrisome renal lesions. The delayed images do not demonstrate any significant collecting system abnormalities. Both ureters are normal. The bladder is normal. No asymmetric bladder wall thickening or bladder mass.  Stomach/Bowel: The stomach, duodenum, small bowel and colon are grossly normal without oral contrast. No inflammatory changes, mass lesions or obstructive findings.  Vascular/Lymphatic: No mesenteric or retroperitoneal mass or adenopathy. The aorta is normal in caliber. Scattered atherosclerotic calcifications. The major venous structures are patent.  Other: No pelvic mass or adenopathy. No free pelvic fluid collections. The uterus  and ovaries are normal.  No inguinal mass or adenopathy.  Musculoskeletal: No significant bony findings.  IMPRESSION: 1. Bilateral renal calculi likely accounting for the patient's hematuria. No obstructing ureteral calculi or bladder calculi. 2. No renal, ureteral or bladder mass. 3. No acute abdominal/pelvic findings, mass lesions or lymphadenopathy.   Electronically Signed  By: Marijo Sanes M.D.  On: 01/10/2016 11:17   CT scan reviewed today personally and with the patient   Cystoscopy Procedure Note  Patient identification was confirmed, informed consent was obtained, and patient was prepped using Betadine solution.  Lidocaine jelly was administered per urethral meatus.    Preoperative abx where received prior to procedure.    Procedure: - Flexible cystoscope introduced, without any difficulty.   - Thorough search of the bladder revealed:    normal urethral meatus    normal urothelium    no stones    no ulcers     no tumors    no urethral polyps  no trabeculation  - Ureteral orifices were normal in position and appearance.  Post-Procedure: - Patient tolerated the procedure well  Assessment & Plan:    1. Gross hematuria Status post negative cystoscopy and CT urogram demonstrating only nonobstructing renal calculi Reviewed with the patient today Suspect hematuria related to kidney stones - Urinalysis, Complete - lidocaine (XYLOCAINE) 2 % jelly 1 application; Place 1 application into the urethra once. - ciprofloxacin (CIPRO) tablet 500 mg; Take 1 tablet (500 mg total) by mouth once. - DG Abd 1 View; Future  2. Nephrolithiasis Remote history of kidney stones CT scan demonstrates bilateral nephrolithiasis, right> left with 5.5 mm renal pelvic stone, nonobstructing We discussed various management of her stones including observation, ESWL, and ureteroscopy Currently NOT interested in surgical management Also discussed metabolic workup given multiple  bilateral stones which she declined Discussed stone diet today including increased fluid intake, avoidance of high salt diet, low oxalate, and low animal protein Recommend follow-up in 6 months with KUB or sooner if she develops flank pain   Return in about 6 months (around 07/18/2016) for KUB.  Hollice Espy, MD  [facility] [address] [place], [place] [address] [phone]

## 2016-03-24 DIAGNOSIS — Z79899 Other long term (current) drug therapy: Secondary | ICD-10-CM | POA: Diagnosis not present

## 2016-04-13 ENCOUNTER — Other Ambulatory Visit: Payer: Self-pay | Admitting: Family Medicine

## 2016-04-13 NOTE — Telephone Encounter (Signed)
Pt hasn't had a TSH check in over a year, please advise

## 2016-04-13 NOTE — Telephone Encounter (Signed)
done

## 2016-04-13 NOTE — Telephone Encounter (Signed)
She usually gets it checked at an outside office  Refill for 6 mo please

## 2016-04-17 ENCOUNTER — Ambulatory Visit (INDEPENDENT_AMBULATORY_CARE_PROVIDER_SITE_OTHER): Payer: PPO | Admitting: Internal Medicine

## 2016-04-17 ENCOUNTER — Encounter: Payer: Self-pay | Admitting: Internal Medicine

## 2016-04-17 VITALS — BP 122/84 | HR 88 | Temp 97.5°F | Wt 182.0 lb

## 2016-04-17 DIAGNOSIS — N3000 Acute cystitis without hematuria: Secondary | ICD-10-CM | POA: Diagnosis not present

## 2016-04-17 LAB — POC URINALSYSI DIPSTICK (AUTOMATED)
Bilirubin, UA: NEGATIVE
GLUCOSE UA: NEGATIVE
Ketones, UA: NEGATIVE
Leukocytes, UA: NEGATIVE
Nitrite, UA: NEGATIVE
Protein, UA: NEGATIVE
SPEC GRAV UA: 1.015
UROBILINOGEN UA: 4
pH, UA: 6

## 2016-04-17 MED ORDER — SULFAMETHOXAZOLE-TRIMETHOPRIM 800-160 MG PO TABS
1.0000 | ORAL_TABLET | Freq: Two times a day (BID) | ORAL | Status: DC
Start: 2016-04-17 — End: 2016-08-21

## 2016-04-17 NOTE — Progress Notes (Signed)
Pre visit review using our clinic review tool, if applicable. No additional management support is needed unless otherwise documented below in the visit note. 

## 2016-04-17 NOTE — Assessment & Plan Note (Signed)
Urinalysis fairly unremarkable other than slight blood Classic cystitis symptoms Will treat with bactrim --hopefully only needs for 3 days

## 2016-04-17 NOTE — Addendum Note (Signed)
Addended by: Pilar Grammes on: 04/17/2016 12:20 PM   Modules accepted: Orders, SmartSet

## 2016-04-17 NOTE — Progress Notes (Signed)
   Subjective:    Patient ID: Samantha Valentine, female    DOB: 1945-10-29, 70 y.o.   MRN: [id number]  HPI Here due to urinary symptoms  Started having urinary urgency and frequency several days ago Intermittent Some terminal dysuria No pain to suggest passing kidney stone Tried pushing fluids Low grade fever  Missed dose of enbrel worrying about possible infection Really needs to take it now  Current Outpatient Prescriptions on File Prior to Visit  Medication Sig Dispense Refill  . CELEBREX 200 MG capsule Take 200-400 mg by mouth daily.     . cyclobenzaprine (FLEXERIL) 10 MG tablet Take 10 mg by mouth 3 (three) times daily as needed.     Scarlette Shorts SURECLICK 50 MG/ML injection Inject 50 mg into the skin once a week.     . levothyroxine (SYNTHROID, LEVOTHROID) 125 MCG tablet TAKE ONE TABLET BY MOUTH ONCE DAILY 30 tablet 5  . LORazepam (ATIVAN) 1 MG tablet Take 1 tablet (1 mg total) by mouth daily as needed for anxiety. 30 tablet 1  . mometasone (ELOCON) 0.1 % cream Apply 1 application topically daily. To affected areas 45 g 3  . multivitamin-lutein (OCUVITE-LUTEIN) CAPS capsule Take 1 capsule by mouth daily.     No current facility-administered medications on file prior to visit.    Allergies  Allergen Reactions  . Hydroxychloroquine Sulfate     REACTION: rash  . Ibandronate Sodium     REACTION: rash  . Risedronate Sodium     REACTION: rash    Past Medical History  Diagnosis Date  . Depression   . Hypothyroidism   . Osteoporosis   . Arthritis     RA  . DDD (degenerative disc disease)     in neck  . Migraine   . Collagen vascular disease ([place])     Rhematoid Arthritis    Past Surgical History  Procedure Laterality Date  . Tonsillectomy    . Lithotripsy      kidney stone  . Joint replacement      total hip replacement    Family History  Problem Relation Age of Onset  . Hypertension Mother   . Cancer Mother     endometrial CA  . Alcohol abuse Father   .  Diabetes Father   . Stroke Father     Social History   Social History  . Marital Status: Divorced    Spouse Name: N/A  . Number of Children: N/A  . Years of Education: N/A   Occupational History  . Not on file.   Social History Main Topics  . Smoking status: Former Smoker    Quit date: 10/20/1983  . Smokeless tobacco: Never Used  . Alcohol Use: No  . Drug Use: No  . Sexual Activity: Not on file   Other Topics Concern  . Not on file   Social History Narrative   Review of Systems  Stomach "discomfort" --slightly off More constipated Did pass stone 2-3 weeks ago--doesn't feel like she is passing one now Appetite is okay     Objective:   Physical Exam  Constitutional: She appears well-developed and well-nourished. No distress.  Abdominal: Soft. Bowel sounds are normal. She exhibits no distension. There is no tenderness. There is no rebound and no guarding.  Musculoskeletal:  No CVA tenderness          Assessment & Plan:

## 2016-04-17 NOTE — Patient Instructions (Signed)
If your symptoms are gone after the first 1 or 2 antibiotics, you can stop after about 3 days.

## 2016-05-08 DIAGNOSIS — M5137 Other intervertebral disc degeneration, lumbosacral region: Secondary | ICD-10-CM | POA: Diagnosis not present

## 2016-05-08 DIAGNOSIS — M0579 Rheumatoid arthritis with rheumatoid factor of multiple sites without organ or systems involvement: Secondary | ICD-10-CM | POA: Diagnosis not present

## 2016-05-08 DIAGNOSIS — M25511 Pain in right shoulder: Secondary | ICD-10-CM | POA: Diagnosis not present

## 2016-05-08 DIAGNOSIS — M5030 Other cervical disc degeneration, unspecified cervical region: Secondary | ICD-10-CM | POA: Diagnosis not present

## 2016-05-11 DIAGNOSIS — M19271 Secondary osteoarthritis, right ankle and foot: Secondary | ICD-10-CM | POA: Diagnosis not present

## 2016-05-11 DIAGNOSIS — M19272 Secondary osteoarthritis, left ankle and foot: Secondary | ICD-10-CM | POA: Diagnosis not present

## 2016-05-11 DIAGNOSIS — M19241 Secondary osteoarthritis, right hand: Secondary | ICD-10-CM | POA: Diagnosis not present

## 2016-05-11 DIAGNOSIS — M19242 Secondary osteoarthritis, left hand: Secondary | ICD-10-CM | POA: Diagnosis not present

## 2016-05-29 DIAGNOSIS — H353132 Nonexudative age-related macular degeneration, bilateral, intermediate dry stage: Secondary | ICD-10-CM | POA: Diagnosis not present

## 2016-06-25 DIAGNOSIS — Z79899 Other long term (current) drug therapy: Secondary | ICD-10-CM | POA: Diagnosis not present

## 2016-07-17 ENCOUNTER — Ambulatory Visit: Payer: PPO | Admitting: Urology

## 2016-08-21 ENCOUNTER — Encounter: Payer: Self-pay | Admitting: Family Medicine

## 2016-08-21 ENCOUNTER — Ambulatory Visit (INDEPENDENT_AMBULATORY_CARE_PROVIDER_SITE_OTHER): Payer: PPO | Admitting: Family Medicine

## 2016-08-21 VITALS — BP 130/76 | HR 75 | Temp 97.5°F | Ht 64.0 in | Wt 180.0 lb

## 2016-08-21 DIAGNOSIS — M810 Age-related osteoporosis without current pathological fracture: Secondary | ICD-10-CM

## 2016-08-21 DIAGNOSIS — E039 Hypothyroidism, unspecified: Secondary | ICD-10-CM

## 2016-08-21 DIAGNOSIS — E559 Vitamin D deficiency, unspecified: Secondary | ICD-10-CM

## 2016-08-21 NOTE — Progress Notes (Signed)
Pre visit review using our clinic review tool, if applicable. No additional management support is needed unless otherwise documented below in the visit note. 

## 2016-08-21 NOTE — Progress Notes (Signed)
Subjective:    Patient ID: Samantha Valentine, female    DOB: 09/13/1946, 70 y.o.   MRN: [id number]  HPI  Here for f/u of chronic medical problems  Moved her mother into asst living (33 yo)  Going through her home   Doing ok / feels about the same  Wishes she could rest better    Wt Readings from Last 3 Encounters:  08/21/16 180 lb (81.6 kg)  04/17/16 182 lb (82.6 kg)  01/17/16 185 lb (83.9 kg)  tries to eat healthy most of the time/ occ junk food  bmi of 30.9 Obese range   Hypothyroid  On 125 mcg of levothyroxine  No clinical changes  Is time for a tsh   She will do that with her rheumatology labs next month  Will need [facility] written order for [facility]  Declines cholesterol screening    Hx of OP and D def  Her D level was ok with Dr Garen Grams last time  She takes 1000 iu daily now  Once took px vit D Has not done dexa - is all to bisphosphenate and took forteo  One fracture (foot)  No falls since   Much of exercise is from yard work  She is limited due to rheumatologic disease   [facility] shot - [facility]   Mood has been ok/stable  Patient Active Problem List   Diagnosis Date Noted  . Eczema 07/13/2014  . Adverse effect of immunosuppressive drug 04/27/2012  . ANXIETY DEPRESSION 10/28/2008  . BACK PAIN 12/29/2007  . Hypothyroidism 12/28/2007  . Vitamin D deficiency 12/28/2007  . HEARING LOSS 12/28/2007  . RHEUMATOID ARTHRITIS 12/28/2007  . [place] DISEASE 12/28/2007  . FIBROMYALGIA 12/28/2007  . Osteoporosis 12/28/2007  . MIGRAINES, HX OF 12/28/2007   Past Medical History:  Diagnosis Date  . Arthritis    RA  . Collagen vascular disease (HCC)    Rhematoid Arthritis  . DDD (degenerative disc disease)    in neck  . Depression   . Hypothyroidism   . Migraine   . Osteoporosis    Past Surgical History:  Procedure Laterality Date  . JOINT REPLACEMENT     total hip replacement  . LITHOTRIPSY     kidney stone  . TONSILLECTOMY     Social  History  Substance Use Topics  . Smoking status: Former Smoker    Quit date: 10/20/1983  . Smokeless tobacco: Never Used  . Alcohol use No   Family History  Problem Relation Age of Onset  . Hypertension Mother   . Cancer Mother     endometrial CA  . Alcohol abuse Father   . Diabetes Father   . Stroke Father    Allergies  Allergen Reactions  . Hydroxychloroquine Sulfate     REACTION: rash  . Ibandronate Sodium     REACTION: rash  . Risedronate Sodium     REACTION: rash   Current Outpatient Prescriptions on File Prior to Visit  Medication Sig Dispense Refill  . CELEBREX 200 MG capsule Take 200-400 mg by mouth daily.     . cyclobenzaprine (FLEXERIL) 10 MG tablet Take 10 mg by mouth 3 (three) times daily as needed.     Scarlette Shorts SURECLICK 50 MG/ML injection Inject 50 mg into the skin once a week.     . levothyroxine (SYNTHROID, LEVOTHROID) 125 MCG tablet TAKE ONE TABLET BY MOUTH ONCE DAILY 30 tablet 5  . LORazepam (ATIVAN) 1 MG tablet Take 1 tablet (  1 mg total) by mouth daily as needed for anxiety. 30 tablet 1  . multivitamin-lutein (OCUVITE-LUTEIN) CAPS capsule Take 1 capsule by mouth daily.     No current facility-administered medications on file prior to visit.     Review of Systems Review of Systems  Constitutional: Negative for fever, appetite change, fatigue and unexpected weight change.  Eyes: Negative for pain and visual disturbance.  Respiratory: Negative for cough and shortness of breath.   Cardiovascular: Negative for cp or palpitations    Gastrointestinal: Negative for nausea, diarrhea and constipation.  Genitourinary: Negative for urgency and frequency.  Skin: Negative for pallor or rash   MSK pos for arthralgia from RA/neg for swelling  Neurological: Negative for weakness, light-headedness, numbness and headaches.  Hematological: Negative for adenopathy. Does not bruise/bleed easily.  Psychiatric/Behavioral: Negative for dysphoric mood. The patient is not  nervous/anxious.         Objective:   Physical Exam  Constitutional: She appears well-developed and well-nourished. No distress.  obese and well appearing   HENT:  Head: Normocephalic and atraumatic.  Mouth/Throat: Oropharynx is clear and moist.  Eyes: Conjunctivae and EOM are normal. Pupils are equal, round, and reactive to light.  Neck: Normal range of motion. Neck supple. No JVD present. Carotid bruit is not present. No thyromegaly present.  Cardiovascular: Normal rate, regular rhythm, normal heart sounds and intact distal pulses.  Exam reveals no gallop.   Pulmonary/Chest: Effort normal and breath sounds normal. No respiratory distress. She has no wheezes. She has no rales.  No crackles  Abdominal: Soft. Bowel sounds are normal. She exhibits no distension, no abdominal bruit and no mass. There is no tenderness.  Musculoskeletal: She exhibits no edema.  Lymphadenopathy:    She has no cervical adenopathy.  Neurological: She is alert. She has normal reflexes. She displays no tremor. No cranial nerve deficit. She exhibits normal muscle tone. Coordination normal.  Skin: Skin is warm and dry. No rash noted.  Psychiatric: She has a normal mood and affect.  Cheerful and talkative           Assessment & Plan:   Problem List Items Addressed This Visit      Endocrine   Hypothyroidism - Primary    No clinical changes Due for TSH Order written by hand on px pad for this - she will have it done with her other labs at her rheumatologist's office          Musculoskeletal and Integument   Osteoporosis    In obese post menopausal female on thyroid supplementation  Has taken 2 y of forteon Allergic (hives) to bisphosphenates One past fracture (foot) and no falls recently Disc need for calcium/ vitamin D/ wt bearing exercise and bone density test every 2 y to monitor Disc safety/ fracture risk in detail   She will disc plan for dexa with her rheumatologist who follows this          Other   Vitamin D deficiency    In pt with OP  This is watched by her rheumatologist  States she has had px D followed by 1000 iu daily and that her level has been stable  Disc imp to bone and overall health

## 2016-08-21 NOTE — Patient Instructions (Addendum)
Talk to Dr Garen Grams regarding bone density and when she wants to do another dexa  There are new options for treatment of OP - if you need treatment it may be valuable  Here is order for lab  Take your vitamin d   Consider some beginning yoga for exercise

## 2016-08-23 NOTE — Assessment & Plan Note (Signed)
No clinical changes Due for TSH Order written by hand on px pad for this - she will have it done with her other labs at her rheumatologist's office

## 2016-08-23 NOTE — Assessment & Plan Note (Signed)
In pt with OP  This is watched by her rheumatologist  States she has had px D followed by 1000 iu daily and that her level has been stable  Disc imp to bone and overall health

## 2016-08-23 NOTE — Assessment & Plan Note (Signed)
In obese post menopausal female on thyroid supplementation  Has taken 2 y of forteon Allergic (hives) to bisphosphenates One past fracture (foot) and no falls recently Disc need for calcium/ vitamin D/ wt bearing exercise and bone density test every 2 y to monitor Disc safety/ fracture risk in detail   She will disc plan for dexa with her rheumatologist who follows this

## 2016-09-16 ENCOUNTER — Telehealth: Payer: Self-pay | Admitting: Family Medicine

## 2016-09-16 NOTE — Telephone Encounter (Signed)
LVM for pt to call back and schedule AWV + labs with Lesia and CPE with PCP. °

## 2016-09-29 DIAGNOSIS — Z79899 Other long term (current) drug therapy: Secondary | ICD-10-CM | POA: Insufficient documentation

## 2016-09-29 DIAGNOSIS — Z96641 Presence of right artificial hip joint: Secondary | ICD-10-CM | POA: Insufficient documentation

## 2016-09-29 DIAGNOSIS — M47812 Spondylosis without myelopathy or radiculopathy, cervical region: Secondary | ICD-10-CM | POA: Insufficient documentation

## 2016-09-29 NOTE — Progress Notes (Signed)
Office Visit Note  Patient: Samantha Valentine             Date of Birth: May 19, 1946           MRN: [id number]             PCP: Loura Pardon, MD Referring: Tower, Wynelle Fanny, MD Visit Date: 10/01/2016 Occupation: Retired Therapist, sports    Subjective:  stiffness.   History of Present Illness: Samantha Valentine is a 70 y.o. female history of sero positive rheumatoid arthritis. She states she continues to have some stiffness on regular basis. She's been having some discomfort in and the C-spine, bilateral hands bilateral ankles and feet. She denies any joint swelling. She's been tolerating her medications without any problems.  Activities of Daily Living:  Patient reports morning stiffness for 10 minutes.   Patient Denies nocturnal pain.  Difficulty dressing/grooming: Denies Difficulty climbing stairs: Reports Difficulty getting out of chair: Reports Difficulty using hands for taps, buttons, cutlery, and/or writing: Reports   Review of Systems  Constitutional: Positive for fatigue. Negative for night sweats, weight gain, weight loss and weakness.  HENT: Positive for mouth dryness. Negative for mouth sores, trouble swallowing, trouble swallowing and nose dryness.   Eyes: Negative for pain, redness, visual disturbance and dryness.  Respiratory: Negative for cough, shortness of breath and difficulty breathing.   Cardiovascular: Negative for chest pain, palpitations, hypertension, irregular heartbeat and swelling in legs/feet.  Gastrointestinal: Negative for blood in stool, constipation and diarrhea.  Endocrine: Negative for increased urination.  Genitourinary: Negative for vaginal dryness.  Musculoskeletal: Positive for arthralgias, joint pain and morning stiffness. Negative for joint swelling, myalgias, muscle weakness, muscle tenderness and myalgias.  Skin: Negative for color change, rash, hair loss, skin tightness, ulcers and sensitivity to sunlight.  Allergic/Immunologic: Negative for susceptible to  infections.  Neurological: Negative for dizziness, memory loss and night sweats.  Hematological: Negative for swollen glands.  Psychiatric/Behavioral: Negative for depressed mood and sleep disturbance. The patient is not nervous/anxious.     PMFS History:  Patient Active Problem List   Diagnosis Date Noted  . High risk medication use 09/29/2016  . DJD (degenerative joint disease), cervical 09/29/2016  . History of right hip replacement 09/29/2016  . Eczema 07/13/2014  . Adverse effect of immunosuppressive drug 04/27/2012  . ANXIETY DEPRESSION 10/28/2008  . BACK PAIN 12/29/2007  . Hypothyroidism 12/28/2007  . Vitamin D deficiency 12/28/2007  . HEARING LOSS 12/28/2007  . Seropositive rheumatoid arthritis of multiple sites ([place]) 12/28/2007  . DDD (degenerative disc disease), lumbar 12/28/2007  . Fibromyalgia 12/28/2007  . Osteoporosis 12/28/2007  . MIGRAINES, HX OF 12/28/2007    Past Medical History:  Diagnosis Date  . Arthritis    RA  . Collagen vascular disease (HCC)    Rhematoid Arthritis  . DDD (degenerative disc disease)    in neck  . Depression   . Hypothyroidism   . Migraine   . Osteoporosis     Family History  Problem Relation Age of Onset  . Hypertension Mother   . Cancer Mother     endometrial CA  . Alcohol abuse Father   . Diabetes Father   . Stroke Father    Past Surgical History:  Procedure Laterality Date  . JOINT REPLACEMENT     total hip replacement  . LITHOTRIPSY     kidney stone  . TONSILLECTOMY     Social History   Social History Narrative  . No narrative on file  Objective: Vital Signs: BP 130/80   Pulse 80   Ht 5\' 4"  (1.626 m)   Wt 180 lb (81.6 kg)   BMI 30.90 kg/m    Physical Exam  Constitutional: She is oriented to person, place, and time. She appears well-developed and well-nourished.  HENT:  Head: Normocephalic and atraumatic.  Eyes: Conjunctivae and EOM are normal.  Neck: Normal range of motion.  Cardiovascular:  Normal rate, regular rhythm, normal heart sounds and intact distal pulses.   Pulmonary/Chest: Effort normal and breath sounds normal.  Abdominal: Soft. Bowel sounds are normal.  Lymphadenopathy:    She has no cervical adenopathy.  Neurological: She is alert and oriented to person, place, and time.  Skin: Skin is warm and dry. Capillary refill takes less than 2 seconds.  Psychiatric: She has a normal mood and affect. Her behavior is normal.  Nursing note and vitals reviewed.    Musculoskeletal Exam: She has some limitation of motion of her C-spine and thoracic lumbar spine. Shoulder joints, elbow joints, wrist joints, MCPs with good range of motion with no synovitis she has thickening of PIP/DIP joints in her hands. She has good range of motion of her hip joints knee joints ankles MTPs PIPs DIPs with no synovitis.  CDAI Exam: CDAI Homunculus Exam:   Joint Counts:  CDAI Tender Joint count: 0 CDAI Swollen Joint count: 0  Global Assessments:  Patient Global Assessment: 5 Provider Global Assessment: 5  CDAI Calculated Score: 10    Investigation: Findings:   05/08/2016 RAPID 3 score  was 3.3, which is moderate severity.  X-rays of the bilateral hands with 2 views showed bilateral PIP, DIP, and CMC narrowing.  There was no MCP joint narrowing, although she just started taking her medication for osteopenia, and some radiocarpal and intercarpal joint space narrowing consistent with overlap of rheumatoid arthritis and osteoarthritis.  06/25/2016 negative TB gold, CBC normal, CMP normal     Imaging: No results found.  Speciality Comments: No specialty comments available.    Procedures:  No procedures performed Allergies: Hydroxychloroquine sulfate; Ibandronate sodium; and Risedronate sodium   Assessment / Plan:     Visit Diagnoses: Seropositive rheumatoid arthritis - erosive disease, positive rheumatoid factor. She has no synovitis on examination today. - Plan: etanercept  (ENBREL SURECLICK) 50 MG/ML injection refill today   High risk medication use - enbrel every week, 06/25/2016 negative TB gold  - Plan: She will have labs today and then every 3 months to monitor for drug toxicity CBC with Differential/Platelet, COMPLETE METABOLIC PANEL WITH GFR  Fibromyalgia: Seems to be fairly well controlled in remission  DDD C-spine: Some stiffness with range of motion  DDD lumbar spine: Limited range of motion  History of right hip replacement: Doing well  Osteopenia of multiple sites - Dexa 2014 shows osteopenia  Right shoulder tendinopathy: The range of motion is improved  Vitamin D deficiency - Plan: We will check VITAMIN D 25 Hydroxy (Vit-D Deficiency, Fractures)    Orders: Orders Placed This Encounter  Procedures  . VITAMIN D 25 Hydroxy (Vit-D Deficiency, Fractures)  . CBC with Differential/Platelet  . COMPLETE METABOLIC PANEL WITH GFR   Meds ordered this encounter  Medications  . etanercept (ENBREL SURECLICK) 50 MG/ML injection    Sig: Inject 0.98 mLs (50 mg total) into the skin once a week.    Dispense:  11.76 mL    Refill:  0    Face-to-face time spent with patient was 30 minutes. 50% of time was spent in  counseling and coordination of care.  Follow-Up Instructions: Return in about 5 months (around 03/01/2017) for Rheumatoid arthritis.   Bo Merino, MD

## 2016-10-01 ENCOUNTER — Other Ambulatory Visit: Payer: Self-pay | Admitting: Rheumatology

## 2016-10-01 ENCOUNTER — Ambulatory Visit (INDEPENDENT_AMBULATORY_CARE_PROVIDER_SITE_OTHER): Payer: PPO | Admitting: Rheumatology

## 2016-10-01 ENCOUNTER — Other Ambulatory Visit: Payer: Self-pay | Admitting: Family Medicine

## 2016-10-01 ENCOUNTER — Encounter: Payer: Self-pay | Admitting: Rheumatology

## 2016-10-01 VITALS — BP 130/80 | HR 80 | Ht 64.0 in | Wt 180.0 lb

## 2016-10-01 DIAGNOSIS — G8929 Other chronic pain: Secondary | ICD-10-CM | POA: Diagnosis not present

## 2016-10-01 DIAGNOSIS — M47812 Spondylosis without myelopathy or radiculopathy, cervical region: Secondary | ICD-10-CM

## 2016-10-01 DIAGNOSIS — E039 Hypothyroidism, unspecified: Secondary | ICD-10-CM | POA: Diagnosis not present

## 2016-10-01 DIAGNOSIS — E559 Vitamin D deficiency, unspecified: Secondary | ICD-10-CM

## 2016-10-01 DIAGNOSIS — Z79899 Other long term (current) drug therapy: Secondary | ICD-10-CM | POA: Diagnosis not present

## 2016-10-01 DIAGNOSIS — M8589 Other specified disorders of bone density and structure, multiple sites: Secondary | ICD-10-CM

## 2016-10-01 DIAGNOSIS — M797 Fibromyalgia: Secondary | ICD-10-CM

## 2016-10-01 DIAGNOSIS — Z96641 Presence of right artificial hip joint: Secondary | ICD-10-CM

## 2016-10-01 DIAGNOSIS — M0579 Rheumatoid arthritis with rheumatoid factor of multiple sites without organ or systems involvement: Secondary | ICD-10-CM | POA: Diagnosis not present

## 2016-10-01 DIAGNOSIS — M5030 Other cervical disc degeneration, unspecified cervical region: Secondary | ICD-10-CM | POA: Diagnosis not present

## 2016-10-01 DIAGNOSIS — M25511 Pain in right shoulder: Secondary | ICD-10-CM

## 2016-10-01 DIAGNOSIS — M5136 Other intervertebral disc degeneration, lumbar region: Secondary | ICD-10-CM

## 2016-10-01 LAB — CBC WITH DIFFERENTIAL/PLATELET
BASOS PCT: 1 %
Basophils Absolute: 63 cells/uL (ref 0–200)
EOS ABS: 252 {cells}/uL (ref 15–500)
Eosinophils Relative: 4 %
HCT: 43.2 % (ref 35.0–45.0)
Hemoglobin: 14 g/dL (ref 11.7–15.5)
Lymphocytes Relative: 24 %
Lymphs Abs: 1512 cells/uL (ref 850–3900)
MCH: 29.5 pg (ref 27.0–33.0)
MCHC: 32.4 g/dL (ref 32.0–36.0)
MCV: 91.1 fL (ref 80.0–100.0)
MONO ABS: 630 {cells}/uL (ref 200–950)
MONOS PCT: 10 %
MPV: 10.7 fL (ref 7.5–12.5)
NEUTROS ABS: 3843 {cells}/uL (ref 1500–7800)
Neutrophils Relative %: 61 %
PLATELETS: 261 10*3/uL (ref 140–400)
RBC: 4.74 MIL/uL (ref 3.80–5.10)
RDW: 13.5 % (ref 11.0–15.0)
WBC: 6.3 10*3/uL (ref 3.8–10.8)

## 2016-10-01 LAB — COMPLETE METABOLIC PANEL WITH GFR
ALT: 11 U/L (ref 6–29)
AST: 13 U/L (ref 10–35)
Albumin: 3.4 g/dL — ABNORMAL LOW (ref 3.6–5.1)
Alkaline Phosphatase: 79 U/L (ref 33–130)
BILIRUBIN TOTAL: 0.4 mg/dL (ref 0.2–1.2)
BUN: 20 mg/dL (ref 7–25)
CHLORIDE: 105 mmol/L (ref 98–110)
CO2: 29 mmol/L (ref 20–31)
Calcium: 8.6 mg/dL (ref 8.6–10.4)
Creat: 0.69 mg/dL (ref 0.50–0.99)
Glucose, Bld: 99 mg/dL (ref 65–99)
Potassium: 4.8 mmol/L (ref 3.5–5.3)
Sodium: 140 mmol/L (ref 135–146)
Total Protein: 6.1 g/dL (ref 6.1–8.1)

## 2016-10-01 LAB — VITAMIN D 25 HYDROXY (VIT D DEFICIENCY, FRACTURES): Vit D, 25-Hydroxy: 37 ng/mL (ref 30–100)

## 2016-10-01 LAB — TSH: TSH: 6.17 mIU/L — ABNORMAL HIGH

## 2016-10-01 MED ORDER — ETANERCEPT 50 MG/ML ~~LOC~~ SOAJ: 50.0000 mg | SUBCUTANEOUS | 0 refills | Status: DC

## 2016-10-01 NOTE — Progress Notes (Signed)
Normal, continue OTC supplement

## 2016-10-01 NOTE — Progress Notes (Signed)
Rheumatology Medication Review by a Pharmacist Does the patient feel that his/her medications are working for him/her?  Yes Has the patient been experiencing any side effects to the medications prescribed?  No Does the patient have any problems obtaining medications?  No  Issues to address at subsequent visits: None   Pharmacist comments:  Mrs. Escandon is a 70 yo F who presents for follow up of her rheumatoid arthritis.  Patient confirms she is taking Enbrel once weekly.  She reports she had her standing labs drawn this morning.  Her TB Gold was collected on 06/25/16 which was negative.  She will be due for standing labs again in March 2018 and for TB Gold again in September 2018.  Patient denies any questions or concerns regarding her medications at this time.    Elisabeth Most, Pharm.D., BCPS Clinical Pharmacist Pager: [phone] Phone: [phone] 10/01/2016 9:34 AM

## 2016-10-01 NOTE — Progress Notes (Signed)
Labs normal.

## 2016-10-01 NOTE — Patient Instructions (Signed)
Standing Labs We placed an order today for your standing lab work.    Please come back and get your standing labs in March and every 3 months  We have open lab Monday through Friday from 8:30-11:30 AM and 1:30-4 PM at the office of Dr. Thayne Cindric/Naitik Panwala, PA.   The office is located at [address], [address], [place], [place] [address] No appointment is necessary.   Labs are drawn by [facility].  You may receive a bill from [facility] for your lab work.    

## 2016-10-02 ENCOUNTER — Telehealth: Payer: Self-pay | Admitting: *Deleted

## 2016-10-02 MED ORDER — LEVOTHYROXINE SODIUM 137 MCG PO TABS: 137.0000 ug | ORAL_TABLET | Freq: Every day | ORAL | 11 refills | Status: DC

## 2016-10-02 NOTE — Telephone Encounter (Signed)
No missed doses, new Rx sent to pharmacy and lab appt scheduled

## 2016-10-02 NOTE — Telephone Encounter (Signed)
-----   Message from Abner Greenspan, MD sent at 10/01/2016  1:57 PM EST ----- Please ask if she has missed levothyroxine doses or taken it differently  If not - increase dose from 125 to 137 mcg  Levothyroxine 137 mcg 1 po qd #30 11 ref  Re check tsh 6 weeks please for hypothyroidism

## 2016-11-12 ENCOUNTER — Other Ambulatory Visit: Payer: Self-pay | Admitting: Family Medicine

## 2016-11-12 DIAGNOSIS — E039 Hypothyroidism, unspecified: Secondary | ICD-10-CM

## 2016-11-13 ENCOUNTER — Other Ambulatory Visit (INDEPENDENT_AMBULATORY_CARE_PROVIDER_SITE_OTHER): Payer: PPO

## 2016-11-13 DIAGNOSIS — E039 Hypothyroidism, unspecified: Secondary | ICD-10-CM

## 2016-11-13 DIAGNOSIS — Z79899 Other long term (current) drug therapy: Secondary | ICD-10-CM

## 2016-11-13 LAB — TSH: TSH: 1.65 u[IU]/mL (ref 0.35–4.50)

## 2016-11-13 NOTE — Addendum Note (Signed)
Addended by: Ellamae Sia on: 11/13/2016 10:12 AM   Modules accepted: Orders

## 2016-11-26 DIAGNOSIS — H353132 Nonexudative age-related macular degeneration, bilateral, intermediate dry stage: Secondary | ICD-10-CM | POA: Diagnosis not present

## 2016-12-22 ENCOUNTER — Telehealth: Payer: Self-pay | Admitting: Family Medicine

## 2016-12-22 NOTE — Telephone Encounter (Signed)
LVM for pt to call back and schedule AWV + labs with Lesia and CPE with PCP. °

## 2016-12-30 ENCOUNTER — Other Ambulatory Visit (INDEPENDENT_AMBULATORY_CARE_PROVIDER_SITE_OTHER): Payer: PPO

## 2016-12-30 DIAGNOSIS — Z79899 Other long term (current) drug therapy: Secondary | ICD-10-CM | POA: Diagnosis not present

## 2016-12-30 LAB — CBC WITH DIFFERENTIAL/PLATELET
BASOS ABS: 74 {cells}/uL (ref 0–200)
Basophils Relative: 1 %
EOS ABS: 370 {cells}/uL (ref 15–500)
Eosinophils Relative: 5 %
HEMATOCRIT: 43.5 % (ref 35.0–45.0)
Hemoglobin: 14.2 g/dL (ref 11.7–15.5)
LYMPHS PCT: 28 %
Lymphs Abs: 2072 cells/uL (ref 850–3900)
MCH: 29.6 pg (ref 27.0–33.0)
MCHC: 32.6 g/dL (ref 32.0–36.0)
MCV: 90.8 fL (ref 80.0–100.0)
MONO ABS: 666 {cells}/uL (ref 200–950)
MPV: 10.9 fL (ref 7.5–12.5)
Monocytes Relative: 9 %
NEUTROS PCT: 57 %
Neutro Abs: 4218 cells/uL (ref 1500–7800)
Platelets: 285 10*3/uL (ref 140–400)
RBC: 4.79 MIL/uL (ref 3.80–5.10)
RDW: 13.3 % (ref 11.0–15.0)
WBC: 7.4 10*3/uL (ref 3.8–10.8)

## 2016-12-30 LAB — COMPLETE METABOLIC PANEL WITH GFR
ALK PHOS: 79 U/L (ref 33–130)
ALT: 9 U/L (ref 6–29)
AST: 12 U/L (ref 10–35)
Albumin: 3.3 g/dL — ABNORMAL LOW (ref 3.6–5.1)
BUN: 19 mg/dL (ref 7–25)
CALCIUM: 8.5 mg/dL — AB (ref 8.6–10.4)
CHLORIDE: 106 mmol/L (ref 98–110)
CO2: 24 mmol/L (ref 20–31)
CREATININE: 0.64 mg/dL (ref 0.50–0.99)
GFR, Est Non African American: >89 mL/min (ref >=60)
Glucose, Bld: 100 mg/dL — ABNORMAL HIGH (ref 65–99)
POTASSIUM: 4.3 mmol/L (ref 3.5–5.3)
Sodium: 139 mmol/L (ref 135–146)
Total Bilirubin: 0.4 mg/dL (ref 0.2–1.2)
Total Protein: 6.4 g/dL (ref 6.1–8.1)

## 2016-12-30 NOTE — Progress Notes (Signed)
Labs normal.

## 2017-01-04 ENCOUNTER — Ambulatory Visit (INDEPENDENT_AMBULATORY_CARE_PROVIDER_SITE_OTHER): Payer: PPO | Admitting: Family Medicine

## 2017-01-04 ENCOUNTER — Encounter: Payer: Self-pay | Admitting: Family Medicine

## 2017-01-04 VITALS — BP 130/78 | HR 86 | Temp 97.6°F | Ht 64.0 in | Wt 180.2 lb

## 2017-01-04 DIAGNOSIS — N63 Unspecified lump in unspecified breast: Secondary | ICD-10-CM | POA: Diagnosis not present

## 2017-01-04 NOTE — Assessment & Plan Note (Signed)
Large mobile mass L breast at 9:00 area  Some discomfort  Pt is fairly certain she would not treat if it were cancer but does desire imaging to get an idea of whether it may be  She has declined mammograms/ b ca screening in the past  Mm and [place] ref done

## 2017-01-04 NOTE — Patient Instructions (Signed)
Stop at check out for referral for breast imaging

## 2017-01-04 NOTE — Progress Notes (Signed)
Pre visit review using our clinic review tool, if applicable. No additional management support is needed unless otherwise documented below in the visit note. 

## 2017-01-04 NOTE — Progress Notes (Signed)
Subjective:    Patient ID: Samantha Valentine, female    DOB: Apr 25, 1946, 71 y.o.   MRN: [id number]  HPI Here for a lump in L breast Noticed it first a year ago Growing and becoming more tender   Has not had a mammogram in years - she made that decision because she would not treat breast cancer if she had it   Reason she came in now- uncomfortable  On Enbrel for her RA -issues with cost/on it 18 years and she does not think it works as well  Stopped methotrexate in the past Unsure if these are linked to breast cancer  Patient Active Problem List   Diagnosis Date Noted  . Breast lump in female 01/04/2017  . High risk medication use 09/29/2016  . DJD (degenerative joint disease), cervical 09/29/2016  . History of right hip replacement 09/29/2016  . Eczema 07/13/2014  . Adverse effect of immunosuppressive drug 04/27/2012  . ANXIETY DEPRESSION 10/28/2008  . BACK PAIN 12/29/2007  . Hypothyroidism 12/28/2007  . Vitamin D deficiency 12/28/2007  . HEARING LOSS 12/28/2007  . Seropositive rheumatoid arthritis of multiple sites ([place]) 12/28/2007  . DDD (degenerative disc disease), lumbar 12/28/2007  . Fibromyalgia 12/28/2007  . Osteoporosis 12/28/2007  . MIGRAINES, HX OF 12/28/2007   Past Medical History:  Diagnosis Date  . Arthritis    RA  . Collagen vascular disease (HCC)    Rhematoid Arthritis  . DDD (degenerative disc disease)    in neck  . Depression   . Hypothyroidism   . Migraine   . Osteoporosis    Past Surgical History:  Procedure Laterality Date  . JOINT REPLACEMENT     total hip replacement  . LITHOTRIPSY     kidney stone  . TONSILLECTOMY     Social History  Substance Use Topics  . Smoking status: Former Smoker    Quit date: 10/20/1983  . Smokeless tobacco: Never Used  . Alcohol use No   Family History  Problem Relation Age of Onset  . Hypertension Mother   . Cancer Mother     endometrial CA  . Alcohol abuse Father   . Diabetes Father   . Stroke Father     Allergies  Allergen Reactions  . Hydroxychloroquine Sulfate     REACTION: rash  . Ibandronate Sodium     REACTION: rash  . Risedronate Sodium     REACTION: rash   Current Outpatient Prescriptions on File Prior to Visit  Medication Sig Dispense Refill  . aspirin EC 81 MG tablet Take 81 mg by mouth daily.    . CELEBREX 200 MG capsule Take 200-400 mg by mouth daily.     . Cholecalciferol 1000 units TBDP Take 1,000 Units by mouth daily.    . cyclobenzaprine (FLEXERIL) 10 MG tablet Take 10 mg by mouth 3 (three) times daily as needed.     . etanercept (ENBREL SURECLICK) 50 MG/ML injection Inject 0.98 mLs (50 mg total) into the skin once a week. 11.76 mL 0  . levothyroxine (SYNTHROID, LEVOTHROID) 137 MCG tablet Take 1 tablet (137 mcg total) by mouth daily before breakfast. 30 tablet 11  . LORazepam (ATIVAN) 1 MG tablet Take 1 tablet (1 mg total) by mouth daily as needed for anxiety. 30 tablet 1  . multivitamin-lutein (OCUVITE-LUTEIN) CAPS capsule Take 1 capsule by mouth daily.     No current facility-administered medications on file prior to visit.      Review of Systems Review of Systems  Constitutional: Negative for fever, appetite change, fatigue and unexpected weight change.  Eyes: Negative for pain and visual disturbance.  Respiratory: Negative for cough and shortness of breath.   Cardiovascular: Negative for cp or palpitations    Gastrointestinal: Negative for nausea, diarrhea and constipation.  Genitourinary: Negative for urgency and frequency. pos for breast lump  Skin: Negative for pallor or rash   MSK pos for aches and pains from arthritis  Neurological: Negative for weakness, light-headedness, numbness and headaches.  Hematological: Negative for adenopathy. Does not bruise/bleed easily.  Psychiatric/Behavioral: Negative for dysphoric mood. The patient is not nervous/anxious.         Objective:   Physical Exam  Constitutional: She appears well-developed and  well-nourished. No distress.  overwt and well app  HENT:  Head: Normocephalic and atraumatic.  Eyes: Conjunctivae and EOM are normal. Pupils are equal, round, and reactive to light.  Neck: Normal range of motion. Neck supple.  Cardiovascular: Normal rate, regular rhythm and normal heart sounds.   Pulmonary/Chest: Effort normal and breath sounds normal. No respiratory distress. She has no wheezes. She has no rales.  Abdominal: Soft. Bowel sounds are normal. She exhibits no distension. There is no tenderness.  Genitourinary:  Genitourinary Comments: Large (over 2-3 cm) oval breast lump L breast at 9:00 No skin change or tenderness Mobile   Breasts are generally dense  No nipple d/c  Musculoskeletal: She exhibits no edema.  Lymphadenopathy:    She has no cervical adenopathy.  Neurological: She is alert.  Skin: Skin is warm and dry. No rash noted. No pallor.  Psychiatric: She has a normal mood and affect.          Assessment & Plan:   Problem List Items Addressed This Visit      Other   Breast lump in female    Large mobile mass L breast at 9:00 area  Some discomfort  Pt is fairly certain she would not treat if it were cancer but does desire imaging to get an idea of whether it may be  She has declined mammograms/ b ca screening in the past  Mm and [place] ref done       Relevant Orders   MM Digital Diagnostic Bilat   US BREAST LTD UNI LEFT INC AXILLA   US BREAST LTD UNI RIGHT INC AXILLA

## 2017-01-12 ENCOUNTER — Ambulatory Visit
Admission: RE | Admit: 2017-01-12 | Discharge: 2017-01-12 | Disposition: A | Discharge status: Home / Self Care | Payer: PPO | Source: Ambulatory Visit | Attending: Family Medicine | Admitting: Family Medicine

## 2017-01-12 DIAGNOSIS — N6321 Unspecified lump in the left breast, upper outer quadrant: Secondary | ICD-10-CM | POA: Diagnosis not present

## 2017-01-12 DIAGNOSIS — N63 Unspecified lump in unspecified breast: Secondary | ICD-10-CM

## 2017-01-12 DIAGNOSIS — M069 Rheumatoid arthritis, unspecified: Secondary | ICD-10-CM | POA: Diagnosis not present

## 2017-01-12 HISTORY — DX: Unspecified lump in unspecified breast: N63.0

## 2017-01-13 ENCOUNTER — Other Ambulatory Visit: Payer: Self-pay | Admitting: Family Medicine

## 2017-01-13 ENCOUNTER — Encounter: Payer: Self-pay | Admitting: Family Medicine

## 2017-01-13 DIAGNOSIS — N6320 Unspecified lump in the left breast, unspecified quadrant: Secondary | ICD-10-CM

## 2017-01-13 DIAGNOSIS — R928 Other abnormal and inconclusive findings on diagnostic imaging of breast: Secondary | ICD-10-CM

## 2017-01-22 NOTE — Progress Notes (Signed)
Office Visit Note  Patient: Samantha Valentine             Date of Birth: 1946/04/25           MRN: [id number]             PCP: Loura Pardon, MD Referring: Tower, Wynelle Fanny, MD Visit Date: 01/28/2017 Occupation: '@GUAROCC' @    Subjective:   Pain and swelling in hands  History of Present Illness: Samantha Valentine is a 71 y.o. female with history of sero positive rheumatoid arthritis some. She states recently her Enbrel has not been working as well. She's been having increased pain and swelling in her joints. She missed her last dose of an Brohl. She describes pain and swelling in her bilateral hands. She has some discomfort in her ankle joints but none of the other joints are swollen. She had a breast lump and had mammogram. The breast biopsy is pending at this time. She's concerned that she may have breast cancer.  Activities of Daily Living:  Patient reports morning stiffness for all day hours.   Patient Reports nocturnal pain.  Difficulty dressing/grooming: Reports Difficulty climbing stairs: Reports Difficulty getting out of chair: Reports Difficulty using hands for taps, buttons, cutlery, and/or writing: Reports   Review of Systems  Constitutional: Positive for fatigue. Negative for night sweats, weight gain, weight loss and weakness.  HENT: Negative for mouth sores, trouble swallowing, trouble swallowing, mouth dryness and nose dryness.   Eyes: Negative for pain, redness, visual disturbance and dryness.  Respiratory: Negative for cough, shortness of breath and difficulty breathing.   Cardiovascular: Negative for chest pain, palpitations, hypertension, irregular heartbeat and swelling in legs/feet.  Gastrointestinal: Negative for blood in stool, constipation and diarrhea.  Endocrine: Negative for increased urination.  Genitourinary: Negative for vaginal dryness.  Musculoskeletal: Positive for arthralgias, joint pain, joint swelling and morning stiffness. Negative for myalgias, muscle  weakness, muscle tenderness and myalgias.  Skin: Negative for color change, rash, hair loss, skin tightness, ulcers and sensitivity to sunlight.  Allergic/Immunologic: Negative for susceptible to infections.  Neurological: Negative for dizziness, memory loss and night sweats.  Hematological: Negative for swollen glands.  Psychiatric/Behavioral: Positive for sleep disturbance. Negative for depressed mood. The patient is not nervous/anxious.     PMFS History:  Patient Active Problem List   Diagnosis Date Noted  . Tendinopathy of right shoulder 01/25/2017  . Osteopenia of multiple sites 01/25/2017  . Breast lump in female 01/04/2017  . High risk medication use 09/29/2016  . DJD (degenerative joint disease), cervical 09/29/2016  . History of right hip replacement 09/29/2016  . Eczema 07/13/2014  . Adverse effect of immunosuppressive drug 04/27/2012  . ANXIETY DEPRESSION 10/28/2008  . BACK PAIN 12/29/2007  . Hypothyroidism 12/28/2007  . Vitamin D deficiency 12/28/2007  . HEARING LOSS 12/28/2007  . Seropositive rheumatoid arthritis of multiple sites ([place]) 12/28/2007  . DDD (degenerative disc disease), lumbar 12/28/2007  . Fibromyalgia 12/28/2007  . Osteoporosis 12/28/2007  . MIGRAINES, HX OF 12/28/2007    Past Medical History:  Diagnosis Date  . Arthritis    RA  . Breast mass 1 year   . Collagen vascular disease (HCC)    Rhematoid Arthritis  . DDD (degenerative disc disease)    in neck  . Depression   . Hypothyroidism   . Migraine   . Osteoporosis     Family History  Problem Relation Age of Onset  . Alcohol abuse Father   . Diabetes Father   .  Stroke Father   . Hypertension Mother   . Cancer Mother     endometrial CA  . Breast cancer Paternal Aunt 12   Past Surgical History:  Procedure Laterality Date  . JOINT REPLACEMENT     total hip replacement  . LITHOTRIPSY     kidney stone  . TONSILLECTOMY     Social History   Social History Narrative  . No narrative  on file     Objective: Vital Signs: BP 140/90   Pulse 88   Resp 14   Ht '5\' 4"'  (1.626 m)   Wt 184 lb (83.5 kg)   BMI 31.58 kg/m    Physical Exam  Constitutional: She is oriented to person, place, and time. She appears well-developed and well-nourished.  HENT:  Head: Normocephalic and atraumatic.  Eyes: Conjunctivae and EOM are normal.  Neck: Normal range of motion.  Cardiovascular: Normal rate, regular rhythm, normal heart sounds and intact distal pulses.   Pulmonary/Chest: Effort normal and breath sounds normal.  Abdominal: Soft. Bowel sounds are normal.  Lymphadenopathy:    She has no cervical adenopathy.  Neurological: She is alert and oriented to person, place, and time.  Skin: Skin is warm and dry. Capillary refill takes less than 2 seconds.  Psychiatric: She has a normal mood and affect. Her behavior is normal.  Nursing note and vitals reviewed.    Musculoskeletal Exam: C-spine and thoracic spine and lumbar spine good range of motion. She has some discomfort with range of motion of her bilateral shoulders. Elbow joints are good range of motion. She has synovitis and tenderness on palpation of her right second MCP joint and left wrist joint. She also has discomfort range of motion of her hip joints and knee joints but no synovitis was noted over her knee joints.  CDAI Exam: CDAI Homunculus Exam:   Tenderness:  LUE: wrist Right hand: 2nd MCP  Swelling:  LUE: wrist Right hand: 2nd MCP  Joint Counts:  CDAI Tender Joint count: 2 CDAI Swollen Joint count: 2  Global Assessments:  Patient Global Assessment: 5 Provider Global Assessment: 5  CDAI Calculated Score: 14    Investigation: No additional findings.  Lab on 12/30/2016  Component Date Value Ref Range Status  . WBC 12/30/2016 7.4  3.8 - 10.8 K/uL Final  . RBC 12/30/2016 4.79  3.80 - 5.10 MIL/uL Final  . Hemoglobin 12/30/2016 14.2  11.7 - 15.5 g/dL Final  . HCT 12/30/2016 43.5  35.0 - 45.0 % Final  .  MCV 12/30/2016 90.8  80.0 - 100.0 fL Final  . MCH 12/30/2016 29.6  27.0 - 33.0 pg Final  . MCHC 12/30/2016 32.6  32.0 - 36.0 g/dL Final  . RDW 12/30/2016 13.3  11.0 - 15.0 % Final  . Platelets 12/30/2016 285  140 - 400 K/uL Final  . MPV 12/30/2016 10.9  7.5 - 12.5 fL Final  . Neutro Abs 12/30/2016 4218  1,500 - 7,800 cells/uL Final  . Lymphs Abs 12/30/2016 2072  850 - 3,900 cells/uL Final  . Monocytes Absolute 12/30/2016 666  200 - 950 cells/uL Final  . Eosinophils Absolute 12/30/2016 370  15 - 500 cells/uL Final  . Basophils Absolute 12/30/2016 74  0 - 200 cells/uL Final  . Neutrophils Relative % 12/30/2016 57  % Final  . Lymphocytes Relative 12/30/2016 28  % Final  . Monocytes Relative 12/30/2016 9  % Final  . Eosinophils Relative 12/30/2016 5  % Final  . Basophils Relative 12/30/2016 1  %  Final  . Smear Review 12/30/2016 Criteria for review not met   Final  . Sodium 12/30/2016 139  135 - 146 mmol/L Final  . Potassium 12/30/2016 4.3  3.5 - 5.3 mmol/L Final  . Chloride 12/30/2016 106  98 - 110 mmol/L Final  . CO2 12/30/2016 24  20 - 31 mmol/L Final  . Glucose, Bld 12/30/2016 100* 65 - 99 mg/dL Final  . BUN 12/30/2016 19  7 - 25 mg/dL Final  . Creat 12/30/2016 0.64  0.50 - 0.99 mg/dL Final   Comment:   For patients > or = 69 years of age: The upper reference limit for Creatinine is approximately 13% higher for people identified as African-American.     . Total Bilirubin 12/30/2016 0.4  0.2 - 1.2 mg/dL Final  . Alkaline Phosphatase 12/30/2016 79  33 - 130 U/L Final  . AST 12/30/2016 12  10 - 35 U/L Final  . ALT 12/30/2016 9  6 - 29 U/L Final  . Total Protein 12/30/2016 6.4  6.1 - 8.1 g/dL Final  . Albumin 12/30/2016 3.3* 3.6 - 5.1 g/dL Final  . Calcium 12/30/2016 8.5* 8.6 - 10.4 mg/dL Final  . GFR, Est African American 12/30/2016 >89  >=60 mL/min Final  . GFR, Est Non African American 12/30/2016 >89  >=60 mL/min Final  Lab on 11/13/2016  Component Date Value Ref Range Status    . TSH 11/13/2016 1.65  0.35 - 4.50 uIU/mL Final    Imaging: US Breast Ltd Uni Left Inc Axilla  Result Date: 01/12/2017 CLINICAL DATA:  Patient presents with a palpable, tender mass in the upper outer left breast. EXAM: 2D DIGITAL DIAGNOSTIC BILATERAL MAMMOGRAM WITH CAD AND ADJUNCT TOMO ULTRASOUND LEFT BREAST COMPARISON:  None ACR Breast Density Category b: There are scattered areas of fibroglandular density. FINDINGS: There is a lobulated mass in the upper outer left breast with associated architectural distortion, corresponding to the palpable abnormality. There are no other discrete masses. There is no other architectural distortion. There are no suspicious calcifications. Mammographic images were processed with CAD. On physical exam, there is a firm tender mass in the upper outer left breast. Targeted ultrasound is performed, showing an irregular, vascular, hypoechoic mass in the left breast at 2 o'clock, 4 cm the nipple, measuring 3 x 2.8 x 2.9 cm. In the left axilla, there is a borderline lymph node with a thickened cortex measuring 4.5 mm, but normal morphology with significant hilar fat. Other smaller lymph nodes noted are within normal limits. IMPRESSION: 1. 3 cm left breast upper outer quadrant mass highly suspicious for a primary breast malignancy. 2. Borderline abnormal left axillary lymph node which could be reactive, particularly given the patient's long history of rheumatoid arthritis, versus metastatic. RECOMMENDATION: 1. Ultrasound-guided core needle biopsy of the left breast mass. Biopsy of the mildly thickened left axillary lymph node should also be considered at that time, but should be discussed with this patient. At this time, the patient is not sure that she will undergo any treatment other than potentially hormonal therapy. For this reason, she may not wish to have the lymph node biopsied. I have discussed the findings and recommendations with the patient. Results were also provided in  writing at the conclusion of the visit. If applicable, a reminder letter will be sent to the patient regarding the next appointment. BI-RADS CATEGORY  5: Highly suggestive of malignancy. Electronically Signed   By: Lajean Manes M.D.   On: 01/12/2017 11:53   Mm Diag Breast  Tomo Bilateral  Result Date: 01/12/2017 CLINICAL DATA:  Patient presents with a palpable, tender mass in the upper outer left breast. EXAM: 2D DIGITAL DIAGNOSTIC BILATERAL MAMMOGRAM WITH CAD AND ADJUNCT TOMO ULTRASOUND LEFT BREAST COMPARISON:  None ACR Breast Density Category b: There are scattered areas of fibroglandular density. FINDINGS: There is a lobulated mass in the upper outer left breast with associated architectural distortion, corresponding to the palpable abnormality. There are no other discrete masses. There is no other architectural distortion. There are no suspicious calcifications. Mammographic images were processed with CAD. On physical exam, there is a firm tender mass in the upper outer left breast. Targeted ultrasound is performed, showing an irregular, vascular, hypoechoic mass in the left breast at 2 o'clock, 4 cm the nipple, measuring 3 x 2.8 x 2.9 cm. In the left axilla, there is a borderline lymph node with a thickened cortex measuring 4.5 mm, but normal morphology with significant hilar fat. Other smaller lymph nodes noted are within normal limits. IMPRESSION: 1. 3 cm left breast upper outer quadrant mass highly suspicious for a primary breast malignancy. 2. Borderline abnormal left axillary lymph node which could be reactive, particularly given the patient's long history of rheumatoid arthritis, versus metastatic. RECOMMENDATION: 1. Ultrasound-guided core needle biopsy of the left breast mass. Biopsy of the mildly thickened left axillary lymph node should also be considered at that time, but should be discussed with this patient. At this time, the patient is not sure that she will undergo any treatment other than  potentially hormonal therapy. For this reason, she may not wish to have the lymph node biopsied. I have discussed the findings and recommendations with the patient. Results were also provided in writing at the conclusion of the visit. If applicable, a reminder letter will be sent to the patient regarding the next appointment. BI-RADS CATEGORY  5: Highly suggestive of malignancy. Electronically Signed   By: Lajean Manes M.D.   On: 01/12/2017 11:53    Speciality Comments: No specialty comments available.    Procedures:  No procedures performed Allergies: Hydroxychloroquine sulfate; Ibandronate sodium; and Risedronate sodium   Assessment / Plan:     Visit Diagnoses: Seropositive rheumatoid arthritis of multiple sites ([place]) - Positive RF with erosive disease. Patient is having synovitis on examination today. Her symptoms have been flaring for the last 1 month. They Enbrel was not effective anymore. We had detailed discussion regarding different treatment options. As her breast biopsy is pending and the results will be available after a while we decided to go forward with methotrexate. She is taking methotrexate in the past which was not very effective. After his discussing indications side effects contraindications were decided to proceed with subcutaneous methotrexate. My plan is to start her on 0.6 mL subcutaneous every week for 2 weeks and if her labs are normal we will increase it to 0.8 ML subcutaneous every week. Folic acid 2 mg by mouth daily will be given. I will give prednisone as a bridging therapy as she is having active synovitis. Prednisone 5 mg tablet will be given and she will take 4 tablets for 4 days, 3 for 4 days, 2 for 4 days, 1 for 4 days and discontinue after that.   High risk medication use - Enbrel 50 mg subcutaneous every week, TB gold to September 2018. And will be discontinued. Her labs in March were within normal limits.   History of right hip replacement: Doing fairly well     DJD (degenerative joint disease), cervical:  Chronic pain. She has increased discomfort in her C-spine recently.   DDD (degenerative disc disease), lumbar: Chronic pain   Vitamin D deficiency: She is on supplement   Tendinopathy of right shoulder  Fibromyalgia: She does have some generalized pain and discomfort   Osteopenia of multiple sites - DEXA scan 2014   She also has Hypothyroidism; Anxiety with Depression; Hearing loss ;Migraines ; Adverse effect of immunosuppressive drug; and Breast lump in female on her problem list.  Orders: Orders Placed This Encounter  Procedures  . CBC with Differential/Platelet  . COMPLETE METABOLIC PANEL WITH GFR   Meds ordered this encounter  Medications  . folic acid (FOLVITE) 1 MG tablet    Sig: Take 2 tablets (2 mg total) by mouth daily.    Dispense:  180 tablet    Refill:  3  . methotrexate 50 MG/2ML injection    Sig: Inject 0.6 mL subcutaneous weekly for two weeks, then increase to 0.8 mL weekly.    Dispense:  10 mL    Refill:  0  . Tuberculin-Allergy Syringes 27G X 1/2" 1 ML KIT    Sig: Inject 1 Syringe into the skin once a week. To be used with weekly methotrexate injections.    Dispense:  12 each    Refill:  3  . predniSONE (DELTASONE) 5 MG tablet    Sig: Take 4 tab QD PO x 4 days, then 3 tab QD x 4 days, then 2 tab QD x 4 days, then 1 tab QD x 4 days, then stop    Dispense:  40 tablet    Refill:  0    Face-to-face time spent with patient was 30 minutes. 50% of time was spent in counseling and coordination of care.  Follow-Up Instructions: Return in about 3 months (around 04/29/2017) for Rheumatoid arthritis.   Bo Merino, MD  Note - This record has been created using Editor, commissioning.  Chart creation errors have been sought, but may not always  have been located. Such creation errors do not reflect on  the standard of medical care.

## 2017-01-25 DIAGNOSIS — M67911 Unspecified disorder of synovium and tendon, right shoulder: Secondary | ICD-10-CM | POA: Insufficient documentation

## 2017-01-28 ENCOUNTER — Ambulatory Visit (INDEPENDENT_AMBULATORY_CARE_PROVIDER_SITE_OTHER): Payer: PPO | Admitting: Rheumatology

## 2017-01-28 ENCOUNTER — Encounter: Payer: Self-pay | Admitting: Rheumatology

## 2017-01-28 VITALS — BP 140/90 | HR 88 | Resp 14 | Ht 64.0 in | Wt 184.0 lb

## 2017-01-28 DIAGNOSIS — Z79899 Other long term (current) drug therapy: Secondary | ICD-10-CM | POA: Diagnosis not present

## 2017-01-28 DIAGNOSIS — E559 Vitamin D deficiency, unspecified: Secondary | ICD-10-CM | POA: Diagnosis not present

## 2017-01-28 DIAGNOSIS — M797 Fibromyalgia: Secondary | ICD-10-CM

## 2017-01-28 DIAGNOSIS — M47812 Spondylosis without myelopathy or radiculopathy, cervical region: Secondary | ICD-10-CM

## 2017-01-28 DIAGNOSIS — M5136 Other intervertebral disc degeneration, lumbar region: Secondary | ICD-10-CM | POA: Diagnosis not present

## 2017-01-28 DIAGNOSIS — M5030 Other cervical disc degeneration, unspecified cervical region: Secondary | ICD-10-CM

## 2017-01-28 DIAGNOSIS — M0579 Rheumatoid arthritis with rheumatoid factor of multiple sites without organ or systems involvement: Secondary | ICD-10-CM

## 2017-01-28 DIAGNOSIS — M8589 Other specified disorders of bone density and structure, multiple sites: Secondary | ICD-10-CM

## 2017-01-28 DIAGNOSIS — M67911 Unspecified disorder of synovium and tendon, right shoulder: Secondary | ICD-10-CM

## 2017-01-28 DIAGNOSIS — Z96641 Presence of right artificial hip joint: Secondary | ICD-10-CM

## 2017-01-28 MED ORDER — "TUBERCULIN-ALLERGY SYRINGES 27G X 1/2"" 1 ML KIT": 1.0000 | PACK | 3 refills | Status: DC

## 2017-01-28 MED ORDER — FOLIC ACID 1 MG PO TABS: 2.0000 mg | ORAL_TABLET | Freq: Every day | ORAL | 3 refills | Status: DC

## 2017-01-28 MED ORDER — METHOTREXATE SODIUM CHEMO INJECTION 50 MG/2ML: INTRAMUSCULAR | 0 refills | Status: DC

## 2017-01-28 MED ORDER — PREDNISONE 5 MG PO TABS: ORAL_TABLET | ORAL | 0 refills | Status: DC

## 2017-01-28 NOTE — Patient Instructions (Signed)
Standing Labs We placed an order today for your standing lab work.    Please come back and get your standing labs in 2 weeks times 2, then every 2 months  We have open lab Monday through Friday from 8:30-11:30 AM and 1:30-4 PM at the office of Dr. Tresa Moore, PA.   The office is located at [address], [place], [place], [place] [address] No appointment is necessary.   Labs are drawn by [facility].  You may receive a bill from [facility] for your lab work.    Start taking methotrexate 0.6 mL weekly for two weeks, then get labs drawn.  We will increase dose to 0.8 mL weekly if labs are normal.  We will do labs again 2 weeks after you increase your dose.    Start taking folic acid 2 mg daily.     Methotrexate subcutaneous injection What is this medicine? METHOTREXATE (METH oh TREX ate) is a cytotoxic drug that also suppresses the immune system. It is used to treat psoriasis and rheumatoid arthritis. This medicine may be used for other purposes; ask your health care provider or pharmacist if you have questions. COMMON BRAND NAME(S): Otrexup, Rasuvo What should I tell my health care provider before I take this medicine? They need to know if you have any of these conditions: -fluid in the stomach area or lungs -if you often drink alcohol -infection or immune system problems -kidney disease -liver disease -low blood counts, like low white cell, platelet, or red cell counts -lung disease -radiation therapy -stomach ulcers -ulcerative colitis -an unusual or allergic reaction to methotrexate, other medicines, foods, dyes, or preservatives -pregnant or trying to get pregnant -breast-feeding How should I use this medicine? This medicine is for injection under the skin. You will be taught how to prepare and give this medicine. Refer to the Instructions for Use that come with your medication packaging. Use exactly as directed. Take your medicine at regular intervals. Do not  take your medicine more often than directed. This medicine should be taken weekly, NOT daily. It is important that you put your used needles and syringes in a special sharps container. Do not put them in a trash can. If you do not have a sharps container, call your pharmacist of healthcare provider to get one. Talk to your pediatrician regarding the use of this medicine in children. While this drug may be prescribed for children as young as 2 years for selected conditions, precautions do apply. Overdosage: If you think you have taken too much of this medicine contact a poison control center or emergency room at once. NOTE: This medicine is only for you. Do not share this medicine with others. What if I miss a dose? If you are not sure if this medicine was injected or if you have a hard time giving the injection, do not inject another dose. Talk with your doctor or health care professional. What may interact with this medicine? This medicine may interact with the following medications: -acitretin -aspirin or aspirin-like medicines including salicylates -azathioprine -certain antibiotics like chloramphenicol, penicillin, tetracycline -cyclosporine -gold -hydroxychloroquine -live virus vaccines -mercaptopurine -NSAIDs, medicines for pain and inflammation, like ibuprofen or naproxen -other cytotoxic agents -penicillamine -phenylbutazone -phenytoin -probenacid -retinoids such as isotretinoin and tretinoin -steroid medicines like prednisone or cortisone -sulfonamides like sulfasalazine and trimethoprim/sulfamethoxazole -theophylline This list may not describe all possible interactions. Give your health care provider a list of all the medicines, herbs, non-prescription drugs, or dietary supplements you use. Also tell  them if you smoke, drink alcohol, or use illegal drugs. Some items may interact with your medicine. What should I watch for while using this medicine? Avoid alcoholic  drinks. This medicine can make you more sensitive to the sun. Keep out of the sun. If you cannot avoid being in the sun, wear protective clothing and use sunscreen. Do not use sun lamps or tanning beds/booths. You may get drowsy or dizzy. Do not drive, use machinery, or do anything that needs mental alertness until you know how this medicine affects you. Do not stand or sit up quickly, especially if you are an older patient. This reduces the risk of dizzy or fainting spells. You may need blood work done while you are taking this medicine. Call your doctor or health care professional for advice if you get a fever, chills or sore throat, or other symptoms of a cold or flu. Do not treat yourself. This drug decreases your body's ability to fight infections. Try to avoid being around people who are sick. This medicine may increase your risk to bruise or bleed. Call your doctor or health care professional if you notice any unusual bleeding. Check with your doctor or health care professional if you get an attack of severe diarrhea, nausea and vomiting, or if you sweat a lot. The loss of too much body fluid can make it dangerous for you to take this medicine. Talk to your doctor about your risk of cancer. You may be more at risk for certain types of cancers if you take this medicine. Both men and women must use effective birth control with this medicine. Do not become pregnant while taking this medicine or until at least 1 normal menstrual cycle has occurred after stopping it. Women should inform their doctor if they wish to become pregnant or think they might be pregnant. Men should not father a child while taking this medicine and for 3 months after stopping it. There is a potential for serious side effects to an unborn child. Talk to your health care professional or pharmacist for more information. Do not breast-feed an infant while taking this medicine. What side effects may I notice from receiving this  medicine? Side effects that you should report to your doctor or health care professional as soon as possible: -allergic reactions like skin rash, itching or hives, swelling of the face, lips, or tongue -breathing problems or shortness of breath -diarrhea -dry, nonproductive cough -low blood counts - this medicine may decrease the number of white blood cells, red blood cells and platelets. You may be at increased risk of infections and bleeding -mouth sores -redness, blistering, peeling or loosening of the skin, including inside the mouth -signs of infection - fever or chills, cough, sore throat, pain or difficulty passing urine -signs and symptoms of bleeding such as bloody or black, tarry stools; red or dark-brown urine; spitting up blood or brown material that looks like coffee grounds; red spots on the skin; unusual bruising or bleeding from the eye, gums, or nose -signs and symptoms of kidney injury like trouble passing urine or change in the amount of urine -signs and symptoms of liver injury like dark yellow or brown urine; general ill feeling or flu-like symptoms; light-colored stools; loss of appetite; nausea; right upper belly pain; unusually weak or tired; yellowing of the eyes or skin Side effects that usually do not require medical attention (report to your doctor or health care professional if they continue or are bothersome): -dizziness -hair loss -headache -  stomach pain -upset stomach -vomiting This list may not describe all possible side effects. Call your doctor for medical advice about side effects. You may report side effects to FDA at 1-800-FDA-1088. Where should I keep my medicine? Keep out of the reach of children. You will be instructed on how to store this medicine. Throw away any unused medicine after the expiration date on the label. NOTE: This sheet is a summary. It may not cover all possible information. If you have questions about this medicine, talk to your  doctor, pharmacist, or health care provider.  2018 Elsevier/Gold Standard (2015-11-07 11:50:46)

## 2017-01-28 NOTE — Progress Notes (Signed)
Pharmacy Note  Subjective: Patient presents today to the [facility] to see Dr. Estanislado Pandy.  Patient seen by the pharmacist for counseling on subcutaneous methotrexate.    Objective: CBC    Component Value Date/Time   WBC 7.4 12/30/2016 0913   RBC 4.79 12/30/2016 0913   HGB 14.2 12/30/2016 0913   HCT 43.5 12/30/2016 0913   PLT 285 12/30/2016 0913   MCV 90.8 12/30/2016 0913   MCH 29.6 12/30/2016 0913   MCHC 32.6 12/30/2016 0913   RDW 13.3 12/30/2016 0913   LYMPHSABS 2,072 12/30/2016 0913   MONOABS 666 12/30/2016 0913   EOSABS 370 12/30/2016 0913   BASOSABS 74 12/30/2016 0913   CMP     Component Value Date/Time   NA 139 12/30/2016 0913   K 4.3 12/30/2016 0913   CL 106 12/30/2016 0913   CO2 24 12/30/2016 0913   GLUCOSE 100 (H) 12/30/2016 0913   BUN 19 12/30/2016 0913   CREATININE 0.64 12/30/2016 0913   CALCIUM 8.5 (L) 12/30/2016 0913   PROT 6.4 12/30/2016 0913   ALBUMIN 3.3 (L) 12/30/2016 0913   AST 12 12/30/2016 0913   ALT 9 12/30/2016 0913   ALKPHOS 79 12/30/2016 0913   BILITOT 0.4 12/30/2016 0913   GFRNONAA >89 12/30/2016 0913   GFRAA >89 12/30/2016 0913    TB Gold: negative (06/25/16)  Chest-xray:  10/25/2013 "IMPRESSION: There is no evidence of acute cardiopulmonary abnormality."  Contraception: post-menopause  Assessment/Plan:  Patient was initiated on methotrexate injections.  Patient was counseled on the purpose, proper use, and adverse effects of methotrexate including nausea, infection, and signs and symptoms of pneumonitis.  Reviewed instructions with patient to take methotrexate 0.6 mL weekly for two weeks then increase to 0.8 mL weekly if labs are normal.  Also counseled to take folic acid 2 mg daily.  Discussed the importance of frequent monitoring of kidney and liver function and blood counts, and provided patient with standing lab instructions.  Counseled patient to avoid NSAIDs and alcohol while on methotrexate.  Provided patient with  educational materials on methotrexate and answered all questions.  Patient consented to methotrexate use.  Will upload into chart.  Educated patient on how to use a vial and syringe and reviewed injection technique with patient.  Patient reports she was on subcutaneous methotrexate in the past and reports she know hows to use vial and syringe.  Provided patient on educational material regarding injection technique and storage of methotrexate.    Elisabeth Most, Pharm.D., BCPS Clinical Pharmacist Pager: [phone] Phone: [phone] 01/28/2017 12:23 PM

## 2017-01-29 ENCOUNTER — Ambulatory Visit
Admission: RE | Admit: 2017-01-29 | Discharge: 2017-01-29 | Disposition: A | Discharge status: Home / Self Care | Payer: PPO | Source: Ambulatory Visit | Attending: Family Medicine | Admitting: Family Medicine

## 2017-01-29 DIAGNOSIS — N6321 Unspecified lump in the left breast, upper outer quadrant: Secondary | ICD-10-CM | POA: Insufficient documentation

## 2017-01-29 DIAGNOSIS — R928 Other abnormal and inconclusive findings on diagnostic imaging of breast: Secondary | ICD-10-CM | POA: Diagnosis not present

## 2017-01-29 DIAGNOSIS — N6320 Unspecified lump in the left breast, unspecified quadrant: Secondary | ICD-10-CM

## 2017-01-29 DIAGNOSIS — C50919 Malignant neoplasm of unspecified site of unspecified female breast: Secondary | ICD-10-CM

## 2017-01-29 DIAGNOSIS — C50412 Malignant neoplasm of upper-outer quadrant of left female breast: Secondary | ICD-10-CM | POA: Diagnosis not present

## 2017-01-29 DIAGNOSIS — C801 Malignant (primary) neoplasm, unspecified: Secondary | ICD-10-CM

## 2017-01-29 HISTORY — DX: Malignant neoplasm of unspecified site of unspecified female breast: C50.919

## 2017-01-29 HISTORY — DX: Malignant (primary) neoplasm, unspecified: C80.1

## 2017-01-29 HISTORY — PX: MASTECTOMY: SHX3

## 2017-01-29 HISTORY — PX: BREAST BIOPSY: SHX20

## 2017-02-03 ENCOUNTER — Encounter: Payer: Self-pay | Admitting: *Deleted

## 2017-02-03 NOTE — Progress Notes (Signed)
  Oncology Nurse Navigator Documentation     Phoned patient with Consult appointment with Dr. Bary Castilla scheduled for 02/04/17 at 3:45.  She is to arrive at 3:30 with photo ID, Insurance , and current medications.  Patient would like to come to [place] prior to appointment to Pick up Breast Cancer Treatment Handbook/folder with hospital services.

## 2017-02-04 ENCOUNTER — Encounter: Payer: Self-pay | Admitting: General Surgery

## 2017-02-04 ENCOUNTER — Ambulatory Visit (INDEPENDENT_AMBULATORY_CARE_PROVIDER_SITE_OTHER): Payer: PPO | Admitting: General Surgery

## 2017-02-04 ENCOUNTER — Ambulatory Visit: Payer: PPO | Admitting: General Surgery

## 2017-02-04 VITALS — BP 144/84 | HR 92 | Resp 12 | Ht 64.0 in | Wt 183.0 lb

## 2017-02-04 DIAGNOSIS — Z171 Estrogen receptor negative status [ER-]: Secondary | ICD-10-CM

## 2017-02-04 DIAGNOSIS — C50412 Malignant neoplasm of upper-outer quadrant of left female breast: Secondary | ICD-10-CM | POA: Diagnosis not present

## 2017-02-04 NOTE — Patient Instructions (Signed)
The patient is aware to call back for any questions or concerns.  

## 2017-02-04 NOTE — Progress Notes (Signed)
Patient ID: Samantha Valentine, female   DOB: 23-Jul-1946, 71 y.o.   MRN: [id number]  Chief Complaint  Patient presents with  . Other    HPI Samantha Valentine is a 71 y.o. female.  who presents for a breast evaluation. The most recent mammogram was and biopsy was done on 01-29-17 showing breast cancer . She states she could feel the knot in the left breast one year ago. She states it has gotten larger over 3-4 months and started causing some pain. Patient does not perform regular self breast checks and does not get regular mammograms done.   She has changed form Enbrel due to the cancer diagnosis but she has not started the methotrexate as of yet for her RA. She is currently on prednisone. The patient has made use of Enbrel for about 18 years with control of her rheumatoid symptoms. She reported to her primary physician that she had considered not having any surgical therapy if it would require discontinuation of her rheumatoid drugs. She did make use of methotrexate for 15 years, as a sole agent providing little benefit, then a stool agent therapy with perhaps modest increase. She had been off methotrexate for 3 years.   She retired from working with [employer]. She is here today with her friend, Kathrynn Ducking (retired Therapist, sports).   HPI  Past Medical History:  Diagnosis Date  . Arthritis    RA  . Breast mass 1 year   . Cancer ([place]) 01/29/2017   left breast INVASIVE MAMMARY CARCINOMA, ER/PR positive  . Collagen vascular disease ([facility])    Rhematoid Arthritis  . DDD (degenerative disc disease)    in neck  . Depression   . Hypothyroidism   . Migraine   . Osteoporosis     Past Surgical History:  Procedure Laterality Date  . BREAST BIOPSY Left 01/29/2017   US guided biopsy INVASIVE MAMMARY CARCINOMA  . JOINT REPLACEMENT  2003   total hip replacement  . LITHOTRIPSY  1997   kidney stone  . TONSILLECTOMY  1970    Family History  Problem Relation Age of Onset  . Alcohol abuse Father   .  Diabetes Father   . Stroke Father   . Hypertension Mother   . Cancer Mother 67    endometrial CA  . Breast cancer Paternal Aunt 80    Social History Social History  Substance Use Topics  . Smoking status: Former Smoker    Packs/day: 1.50    Years: 25.00    Types: Cigarettes    Quit date: 10/20/1983  . Smokeless tobacco: Never Used  . Alcohol use No    Allergies  Allergen Reactions  . Hydroxychloroquine Sulfate     REACTION: rash  . Ibandronate Sodium     REACTION: rash  . Risedronate Sodium     REACTION: rash    Current Outpatient Prescriptions  Medication Sig Dispense Refill  . aspirin EC 81 MG tablet Take 81 mg by mouth daily.    . CELEBREX 200 MG capsule Take 200-400 mg by mouth daily.     . Cholecalciferol 1000 units TBDP Take 1,000 Units by mouth daily.    Marland Kitchen levothyroxine (SYNTHROID, LEVOTHROID) 137 MCG tablet Take 1 tablet (137 mcg total) by mouth daily before breakfast. 30 tablet 11  . predniSONE (DELTASONE) 5 MG tablet Take 4 tab QD PO x 4 days, then 3 tab QD x 4 days, then 2 tab QD x 4 days, then 1 tab QD x 4  days, then stop 40 tablet 0  . methotrexate 50 MG/2ML injection Inject 0.6 mL subcutaneous weekly for two weeks, then increase to 0.8 mL weekly. (Patient not taking: Reported on 02/04/2017) 10 mL 0   No current facility-administered medications for this visit.     Review of Systems Review of Systems  Constitutional: Negative.   HENT: Negative.   Eyes: Negative.   Respiratory: Positive for shortness of breath (with modest activity.). Negative for cough, choking, chest tightness and wheezing.   Cardiovascular: Negative.  Negative for chest pain.    Blood pressure (!) 144/84, pulse 92, resp. rate 12, height '5\' 4"'  (1.626 m), weight 183 lb (83 kg), SpO2 96 %.  Physical Exam Physical Exam  Constitutional: She is oriented to person, place, and time. She appears well-developed and well-nourished.  HENT:  Mouth/Throat: Oropharynx is clear and moist.  Eyes:  Conjunctivae are normal. No scleral icterus.  Neck: Neck supple.  Cardiovascular: Normal rate, regular rhythm and normal heart sounds.   Pulmonary/Chest: Effort normal and breath sounds normal. Right breast exhibits no inverted nipple, no mass, no nipple discharge, no skin change and no tenderness. Left breast exhibits mass. Left breast exhibits no inverted nipple, no nipple discharge, no skin change and no tenderness.    3 cm mass at 2 o'clock left breast 5 CFN  Lymphadenopathy:    She has no cervical adenopathy.    She has no axillary adenopathy.       Left: No supraclavicular adenopathy present.  Neurological: She is alert and oriented to person, place, and time.  Skin: Skin is warm and dry.  Psychiatric: Her behavior is normal.    Data Reviewed Biopsy of 01/29/2017 showed an invasive mammary carcinoma. ER 90%, PR 90%, HER-2 2+. Fish pending.  Assessment    Clinical stage II carcinoma the left breast.    Plan    I'm not aware of any direct correlation between biologic agent such as Enbrel and solid tumors of the breast. From my point of view there is no contraindication to continue medication except for a week. Prior to any surgical intervention. The patient is encouraged to discuss this in detail with Dr. Grayland Ormond at her appointment presently scheduled for April 20.  She had expressed that she might just allow breast cancer to "take her away". I described out for metastatic breast cancer is quite unpleasant especially with brain metastases or bone metastases. At worst, we might have 6 months of turbulence with rheumatoid if she did do chemotherapy as has been encouraged, but I wouldn't his speech she would be able to resume her medications. Even chemotherapy might provide some beneficial effect to her inflammatory process.  I strongly encouraged her to meet with medical oncology and do approach their recommendations with an open mind.  If she is amenable to neoadjuvant  chemotherapy, PowerPort placement can be completed and this procedure was reviewed in detail.  The patient is a "B cup" breast, but she could have primary surgery followed  adjuvant therapy if she had a high Mammoprint score. If she is HER-2 positive, there certainly would be strong evidence to support neoadjuvant therapy.   The majority of the visit was spent reviewing the options for breast cancer treatment. Breast conservation with lumpectomy and radiation therapy  was presented as equivalent to mastectomy for long-term control. The pros and cons of each treatment regimen were reviewed.   The risks associated with central venous access including arterial, pulmonary and venous injury were reviewed. The possible  need for additional treatment if pulmonary injury occurs (chest tube placement) was discussed.   HPI, Physical Exam, Assessment and Plan have been scribed under the direction and in the presence of Robert Bellow, MD.  Karie Fetch, RN  I have completed the exam and reviewed the above documentation for accuracy and completeness.  I agree with the above.  Haematologist has been used and any errors in dictation or transcription are unintentional.  Hervey Ard, M.D., F.A.C.S.  Robert Bellow 02/05/2017, 6:45 AM

## 2017-02-04 NOTE — Progress Notes (Signed)
[facility]  [phone] [phone]) [phone]  ID: CONNEE IKNER OB: May 03, 1946  MR#: [id number]  AOZ#:[id number]  Patient Care Team: Abner Greenspan, MD as PCP - Merkel, MD as Consulting Physician (Family Medicine) Robert Bellow, MD (General Surgery)  CHIEF COMPLAINT: Clinical stage IB ER/PR positive, HER-2 2+ invasive carcinoma of the upper-outer quadrant of the left breast.  INTERVAL HISTORY: Patient is a 71 year old female with past medical history significant for rheumatoid arthritis on Enbrel who noticed a breast lump size of a jelly bean approximately 6 months ago. Patient states she "got busy" and forgot about it until recently when she noticed her mass had significantly increased in size. Subsequent mammogram and biopsy revealed the above stated breast cancer. She has joint pain from her rheumatoid arthritis, but otherwise feels well. She has no neurologic complaints. She denies any recent fevers or illnesses. She has a good appetite and denies weight loss. She denies any chest pain or shortness of breath. She denies any nausea, vomiting, constipation, or diarrhea. She has no urinary complaints. Patient otherwise feels well and offers no further specific complaints.  REVIEW OF SYSTEMS:   Review of Systems  Constitutional: Negative.  Negative for fever, malaise/fatigue and weight loss.  Respiratory: Negative.  Negative for cough and shortness of breath.   Cardiovascular: Negative.  Negative for chest pain and leg swelling.  Gastrointestinal: Negative.  Negative for abdominal pain.  Genitourinary: Negative.   Musculoskeletal: Positive for joint pain.  Skin: Negative.   Neurological: Negative.  Negative for weakness.  Psychiatric/Behavioral: Negative.  The patient is not nervous/anxious.     As per HPI. Otherwise, a complete review of systems is negative.  PAST MEDICAL HISTORY: Past Medical History:  Diagnosis Date  . Arthritis    RA  . Breast mass 1 year   . Cancer ([place]) 01/29/2017   left breast INVASIVE MAMMARY CARCINOMA, ER/PR positive  . Collagen vascular disease (HCC)    Rhematoid Arthritis  . DDD (degenerative disc disease)    in neck  . Depression   . Hypothyroidism   . Migraine   . Osteoporosis     PAST SURGICAL HISTORY: Past Surgical History:  Procedure Laterality Date  . BREAST BIOPSY Left 01/29/2017   US guided biopsy INVASIVE MAMMARY CARCINOMA  . JOINT REPLACEMENT  2003   total hip replacement  . LITHOTRIPSY  1997   kidney stone  . TONSILLECTOMY  1970    FAMILY HISTORY: Family History  Problem Relation Age of Onset  . Alcohol abuse Father   . Diabetes Father   . Stroke Father   . Hypertension Mother   . Endometrial cancer Mother   . Osteoarthritis Mother   . Breast cancer Paternal Aunt 56  . Liver disease Sister   . Breast cancer Maternal Aunt   . Rheum arthritis Maternal Grandmother   . Diabetes Paternal Grandmother   . Parkinson's disease Paternal Grandfather     ADVANCED DIRECTIVES (Y/N):  N  HEALTH MAINTENANCE: Social History  Substance Use Topics  . Smoking status: Former Smoker    Packs/day: 1.50    Years: 25.00    Types: Cigarettes    Quit date: 10/20/1983  . Smokeless tobacco: Never Used  . Alcohol use No     Colonoscopy:  PAP:  Bone density:  Lipid panel:  Allergies  Allergen Reactions  . Hydroxychloroquine Sulfate     REACTION: rash  . Ibandronate Sodium     REACTION: rash  .  Risedronate Sodium     REACTION: rash    Current Outpatient Prescriptions  Medication Sig Dispense Refill  . aspirin EC 81 MG tablet Take 81 mg by mouth daily.    . CELEBREX 200 MG capsule Take 200-400 mg by mouth daily.     . Cholecalciferol 1000 units TBDP Take 1,000 Units by mouth daily.    Marland Kitchen levothyroxine (SYNTHROID, LEVOTHROID) 137 MCG tablet Take 1 tablet (137 mcg total) by mouth daily before breakfast. 30 tablet 11  . predniSONE (DELTASONE) 5 MG tablet Take 4 tab QD  PO x 4 days, then 3 tab QD x 4 days, then 2 tab QD x 4 days, then 1 tab QD x 4 days, then stop 40 tablet 0  . methotrexate 50 MG/2ML injection Inject 0.6 mL subcutaneous weekly for two weeks, then increase to 0.8 mL weekly. (Patient not taking: Reported on 02/04/2017) 10 mL 0   No current facility-administered medications for this visit.     OBJECTIVE: Vitals:   02/05/17 1123  BP: (!) 147/89  Pulse: 86  Resp: 18  Temp: 99.1 F (37.3 C)     Body mass index is 31.56 kg/m.    ECOG FS:0 - Asymptomatic  General: Well-developed, well-nourished, no acute distress. Eyes: Pink conjunctiva, anicteric sclera. HEENT: Normocephalic, moist mucous membranes, clear oropharnyx. Breasts: Easily palpable left breast mass. Lungs: Clear to auscultation bilaterally. Heart: Regular rate and rhythm. No rubs, murmurs, or gallops. Abdomen: Soft, nontender, nondistended. No organomegaly noted, normoactive bowel sounds. Musculoskeletal: No edema, cyanosis, or clubbing. Neuro: Alert, answering all questions appropriately. Cranial nerves grossly intact. Skin: No rashes or petechiae noted. Psych: Normal affect. Lymphatics: No cervical, calvicular, axillary or inguinal LAD.   LAB RESULTS:  Lab Results  Component Value Date   NA 139 12/30/2016   K 4.3 12/30/2016   CL 106 12/30/2016   CO2 24 12/30/2016   GLUCOSE 100 (H) 12/30/2016   BUN 19 12/30/2016   CREATININE 0.64 12/30/2016   CALCIUM 8.5 (L) 12/30/2016   PROT 6.4 12/30/2016   ALBUMIN 3.3 (L) 12/30/2016   AST 12 12/30/2016   ALT 9 12/30/2016   ALKPHOS 79 12/30/2016   BILITOT 0.4 12/30/2016   GFRNONAA >89 12/30/2016   GFRAA >89 12/30/2016    Lab Results  Component Value Date   WBC 7.4 12/30/2016   NEUTROABS 4,218 12/30/2016   HGB 14.2 12/30/2016   HCT 43.5 12/30/2016   MCV 90.8 12/30/2016   PLT 285 12/30/2016     STUDIES: US Breast Ltd Uni Left Inc Axilla  Result Date: 01/12/2017 CLINICAL DATA:  Patient presents with a palpable,  tender mass in the upper outer left breast. EXAM: 2D DIGITAL DIAGNOSTIC BILATERAL MAMMOGRAM WITH CAD AND ADJUNCT TOMO ULTRASOUND LEFT BREAST COMPARISON:  None ACR Breast Density Category b: There are scattered areas of fibroglandular density. FINDINGS: There is a lobulated mass in the upper outer left breast with associated architectural distortion, corresponding to the palpable abnormality. There are no other discrete masses. There is no other architectural distortion. There are no suspicious calcifications. Mammographic images were processed with CAD. On physical exam, there is a firm tender mass in the upper outer left breast. Targeted ultrasound is performed, showing an irregular, vascular, hypoechoic mass in the left breast at 2 o'clock, 4 cm the nipple, measuring 3 x 2.8 x 2.9 cm. In the left axilla, there is a borderline lymph node with a thickened cortex measuring 4.5 mm, but normal morphology with significant hilar fat. Other smaller lymph  nodes noted are within normal limits. IMPRESSION: 1. 3 cm left breast upper outer quadrant mass highly suspicious for a primary breast malignancy. 2. Borderline abnormal left axillary lymph node which could be reactive, particularly given the patient's long history of rheumatoid arthritis, versus metastatic. RECOMMENDATION: 1. Ultrasound-guided core needle biopsy of the left breast mass. Biopsy of the mildly thickened left axillary lymph node should also be considered at that time, but should be discussed with this patient. At this time, the patient is not sure that she will undergo any treatment other than potentially hormonal therapy. For this reason, she may not wish to have the lymph node biopsied. I have discussed the findings and recommendations with the patient. Results were also provided in writing at the conclusion of the visit. If applicable, a reminder letter will be sent to the patient regarding the next appointment. BI-RADS CATEGORY  5: Highly suggestive of  malignancy. Electronically Signed   By: Lajean Manes M.D.   On: 01/12/2017 11:53   Mm Diag Breast Tomo Bilateral  Result Date: 01/12/2017 CLINICAL DATA:  Patient presents with a palpable, tender mass in the upper outer left breast. EXAM: 2D DIGITAL DIAGNOSTIC BILATERAL MAMMOGRAM WITH CAD AND ADJUNCT TOMO ULTRASOUND LEFT BREAST COMPARISON:  None ACR Breast Density Category b: There are scattered areas of fibroglandular density. FINDINGS: There is a lobulated mass in the upper outer left breast with associated architectural distortion, corresponding to the palpable abnormality. There are no other discrete masses. There is no other architectural distortion. There are no suspicious calcifications. Mammographic images were processed with CAD. On physical exam, there is a firm tender mass in the upper outer left breast. Targeted ultrasound is performed, showing an irregular, vascular, hypoechoic mass in the left breast at 2 o'clock, 4 cm the nipple, measuring 3 x 2.8 x 2.9 cm. In the left axilla, there is a borderline lymph node with a thickened cortex measuring 4.5 mm, but normal morphology with significant hilar fat. Other smaller lymph nodes noted are within normal limits. IMPRESSION: 1. 3 cm left breast upper outer quadrant mass highly suspicious for a primary breast malignancy. 2. Borderline abnormal left axillary lymph node which could be reactive, particularly given the patient's long history of rheumatoid arthritis, versus metastatic. RECOMMENDATION: 1. Ultrasound-guided core needle biopsy of the left breast mass. Biopsy of the mildly thickened left axillary lymph node should also be considered at that time, but should be discussed with this patient. At this time, the patient is not sure that she will undergo any treatment other than potentially hormonal therapy. For this reason, she may not wish to have the lymph node biopsied. I have discussed the findings and recommendations with the patient. Results were  also provided in writing at the conclusion of the visit. If applicable, a reminder letter will be sent to the patient regarding the next appointment. BI-RADS CATEGORY  5: Highly suggestive of malignancy. Electronically Signed   By: Lajean Manes M.D.   On: 01/12/2017 11:53   Mm Clip Placement Left  Result Date: 01/29/2017 CLINICAL DATA:  Post biopsy mammogram of the left breast for clip placement. EXAM: DIAGNOSTIC LEFT MAMMOGRAM POST ULTRASOUND BIOPSY COMPARISON:  Previous exam(s). FINDINGS: Mammographic images were obtained following ultrasound guided biopsy of left breast mass at 2 o'clock. The wing shaped biopsy marking clip is appropriately positioned within the biopsied mass in the upper-outer left breast. IMPRESSION: Appropriate positioning of the wing shaped biopsy marking clip in the mass in the upper-outer left breast. Final Assessment:  Post Procedure Mammograms for Marker Placement Electronically Signed   By: Ammie Ferrier M.D.   On: 01/29/2017 09:17   [place] Lt Breast Bx W Loc Dev 1st Lesion Img Bx Spec US Guide  Addendum Date: 02/05/2017   ADDENDUM REPORT: 02/05/2017 10:28 ADDENDUM: PATHOLOGY: Grade 2 invasive mammary carcinoma, no special type. CONCORDANT: Yes I telephoned the patient on 02/01/2017 at 3:25 p.m. and discussed these results and the recommendations stated below. All questions were answered. The patient denies significant pain or bleeding from the biopsy site. Biopsy site care instructions were reviewed and the patient was asked to call [facility] with any questions or issues related to the biopsy. RECOMMENDATION: Surgical excision and multidisciplinary consultation recommended. The patient has rheumatoid arthritis and is concerned about the combination of treatment of breast cancer and her rheumatoid disease, and is inclined to not undergo treatment for her breast cancer because of this. I advised her to at least have consultations with potential treating physicians to  determine her options before making any final decisions. The patient had a surgical consultation with Dr. Bary Castilla on 02/04/2017. Electronically Signed   By: Ammie Ferrier M.D.   On: 02/05/2017 10:28   Result Date: 02/05/2017 CLINICAL DATA:  71 year old female presenting for ultrasound-guided biopsy of a left breast mass. EXAM: ULTRASOUND GUIDED LEFT BREAST CORE NEEDLE BIOPSY COMPARISON:  Previous exam(s). FINDINGS: I met with the patient and we discussed the procedure of ultrasound-guided biopsy, including benefits and alternatives. We discussed the high likelihood of a successful procedure. We discussed the risks of the procedure, including infection, bleeding, tissue injury, clip migration, and inadequate sampling. Informed written consent was given. The usual time-out protocol was performed immediately prior to the procedure. Lesion quadrant: Upper-outer Using sterile technique and 1% Lidocaine as local anesthetic, under direct ultrasound visualization, a 14 gauge spring-loaded device was used to perform biopsy of mass in the left breast at 2 o'clock using an inferolateral approach. At the conclusion of the procedure a wing shaped tissue marker clip was deployed into the biopsy cavity. Follow up 2 view mammogram was performed and dictated separately. IMPRESSION: Ultrasound guided biopsy of left breast mass at 2 o'clock. No apparent complications. Electronically Signed: By: Ammie Ferrier M.D. On: 01/29/2017 09:21    ASSESSMENT: Clinical stage IB ER/PR positive, HER-2 2+ invasive carcinoma of the upper-outer quadrant of the left breast  PLAN:    1. Clinical stage IB ER/PR positive, HER-2 2+ invasive carcinoma of the upper-outer quadrant of the left breast: Given the size of patient's tumor, have recommended neoadjuvant chemotherapy. Her specific regimen will be determined later once her HER-2 results are finalized. Once patient completes chemotherapy, she will require lumpectomy or mastectomy. She  will then require adjuvant XRT depending on the type of surgery she chooses. Finally, patient will require an aromatase inhibitor for 5 years given the ER/PR positivity of her disease. Will get CT scans to complete staging workup. Patient will also require a MUGA and a port prior to initiating chemotherapy. Return to clinic on Feb 17, 2017 to initiate cycle 1 of chemotherapy. 2. Rheumatoid arthritis: Patient has discontinued Enbrel since the diagnosis of her malignancy. Since this is a solid tumor, okay to restart from an oncology standpoint. Having said this, have instructed patient to continue to hold until she completes chemotherapy.   Approximately 45 minutes was spent in discussion of which greater than 50% was consultation.  Patient expressed understanding and was in agreement with this plan. She also understands that  She can call clinic at any time with any questions, concerns, or complaints.   Cancer Staging Malignant neoplasm of upper-outer quadrant of left breast in female, estrogen receptor negative ([place]) Staging form: Breast, AJCC 8th Edition - Clinical stage from 02/04/2017: Stage IB (cT2, cN0, cM0, G2, ER: Positive, PR: Positive, HER2: Equivocal) - Signed by Lloyd Huger, MD on 02/04/2017   Lloyd Huger, MD   02/06/2017 1:47 PM

## 2017-02-05 ENCOUNTER — Encounter: Payer: Self-pay | Admitting: *Deleted

## 2017-02-05 ENCOUNTER — Inpatient Hospital Stay: Payer: PPO | Attending: Oncology | Admitting: Oncology

## 2017-02-05 ENCOUNTER — Encounter: Payer: Self-pay | Admitting: Oncology

## 2017-02-05 DIAGNOSIS — C50412 Malignant neoplasm of upper-outer quadrant of left female breast: Secondary | ICD-10-CM | POA: Insufficient documentation

## 2017-02-05 DIAGNOSIS — Z803 Family history of malignant neoplasm of breast: Secondary | ICD-10-CM

## 2017-02-05 DIAGNOSIS — Z7982 Long term (current) use of aspirin: Secondary | ICD-10-CM | POA: Diagnosis not present

## 2017-02-05 DIAGNOSIS — M810 Age-related osteoporosis without current pathological fracture: Secondary | ICD-10-CM | POA: Insufficient documentation

## 2017-02-05 DIAGNOSIS — Z170 Estrogen receptor positive status [ER+]: Secondary | ICD-10-CM | POA: Diagnosis not present

## 2017-02-05 DIAGNOSIS — Z87891 Personal history of nicotine dependence: Secondary | ICD-10-CM | POA: Insufficient documentation

## 2017-02-05 DIAGNOSIS — E039 Hypothyroidism, unspecified: Secondary | ICD-10-CM | POA: Insufficient documentation

## 2017-02-05 DIAGNOSIS — Z79899 Other long term (current) drug therapy: Secondary | ICD-10-CM

## 2017-02-05 DIAGNOSIS — M069 Rheumatoid arthritis, unspecified: Secondary | ICD-10-CM | POA: Insufficient documentation

## 2017-02-05 NOTE — Progress Notes (Signed)
  Oncology Nurse Navigator Documentation  Navigator Location: [facility]-Med Onc (02/05/17 1400)   )Navigator Encounter Type: Initial MedOnc (02/05/17 1400)                     Patient Visit Type: MedOnc (02/05/17 1400) Treatment Phase: Pre-Tx/Tx Discussion (02/05/17 1400) Barriers/Navigation Needs: Financial (02/05/17 1400) Education: Other (02/05/17 1400)    Met patient and her friend today during her initial medical oncology consult with Dr. Grayland Ormond.  Dr. Grayland Ormond has recommended neoadjuvant chemotherapy.  Patient had multiple questions and concerns in regards to her RA and taking Imbrel.  Dr. Grayland Ormond had a lengthy discussion with her.  Also gave the patient a note from [place] with information regarding her insurance and possible deductible.  She is to call if she has any questions or needs.                    Time Spent with Patient: 60 (02/05/17 1400)

## 2017-02-08 ENCOUNTER — Telehealth: Payer: Self-pay | Admitting: General Surgery

## 2017-02-08 ENCOUNTER — Telehealth: Payer: Self-pay | Admitting: *Deleted

## 2017-02-08 NOTE — Telephone Encounter (Signed)
Patient's right power port placement has been scheduled for 02-15-17 at [facility].   It is okay for patient to continue an 81 mg aspirin once daily.   Patient was instructed to call the office should she have further questions.

## 2017-02-08 NOTE — Telephone Encounter (Signed)
I confirmed that the patient plans to proceed with neoadjuvant chemotherapy. We have previously reviewed the advisability of PowerPort placement for this treatment regimen. She is scheduled for a MUGA scan and CT on April 27. We'll look to place her port early next week.

## 2017-02-08 NOTE — Telephone Encounter (Signed)
Thanks so much for letting me know and taking care of her !

## 2017-02-09 ENCOUNTER — Other Ambulatory Visit: Payer: Self-pay | Admitting: General Surgery

## 2017-02-09 ENCOUNTER — Encounter: Payer: Self-pay | Admitting: Oncology

## 2017-02-09 DIAGNOSIS — C50412 Malignant neoplasm of upper-outer quadrant of left female breast: Secondary | ICD-10-CM

## 2017-02-09 LAB — SURGICAL PATHOLOGY

## 2017-02-09 NOTE — Telephone Encounter (Signed)
Left pt message asking to call Allison back directly at [phone] to schedule AWV.+ labs with Lesia and CPE with PCP. °

## 2017-02-10 ENCOUNTER — Other Ambulatory Visit: Payer: Self-pay | Admitting: Oncology

## 2017-02-10 ENCOUNTER — Encounter: Payer: Self-pay | Admitting: Body Imaging

## 2017-02-10 ENCOUNTER — Encounter: Payer: Self-pay | Admitting: *Deleted

## 2017-02-10 DIAGNOSIS — C50412 Malignant neoplasm of upper-outer quadrant of left female breast: Secondary | ICD-10-CM

## 2017-02-10 DIAGNOSIS — Z171 Estrogen receptor negative status [ER-]: Principal | ICD-10-CM

## 2017-02-10 MED ORDER — ONDANSETRON HCL 8 MG PO TABS: 8.0000 mg | ORAL_TABLET | Freq: Two times a day (BID) | ORAL | 2 refills | Status: DC | PRN

## 2017-02-10 MED ORDER — PROCHLORPERAZINE MALEATE 10 MG PO TABS: 10.0000 mg | ORAL_TABLET | Freq: Four times a day (QID) | ORAL | 2 refills | Status: DC | PRN

## 2017-02-10 MED ORDER — LIDOCAINE-PRILOCAINE 2.5-2.5 % EX CREA
TOPICAL_CREAM | CUTANEOUS | 3 refills | Status: DC
Start: 2017-02-10 — End: 2017-05-25

## 2017-02-10 NOTE — Progress Notes (Signed)
START ON PATHWAY REGIMEN - Breast   Dose-Dense AC q14 days:   A cycle is every 14 days:     Doxorubicin      Cyclophosphamide      Pegfilgrastim   **Always confirm dose/schedule in your pharmacy ordering system**    Paclitaxel 80 mg/m2 Weekly:   Administer weekly:     Paclitaxel   **Always confirm dose/schedule in your pharmacy ordering system**    Patient Characteristics: Preoperative or Nonsurgical Candidate (Clinical Staging), Neoadjuvant Therapy followed by Surgery, Invasive Disease, Chemotherapy, HER2 Negative/Unknown/Equivocal, ER Positive Therapeutic Status: Preoperative or Nonsurgical Candidate (Clinical Staging) AJCC M Category: cM0 AJCC Grade: G2 Breast Surgical Plan: Neoadjuvant Therapy followed by Surgery ER Status: Positive (+) AJCC 8 Stage Grouping: IB HER2 Status: Negative (-) AJCC T Category: cT2 AJCC N Category: cN0 PR Status: Positive (+)  Intent of Therapy: Curative Intent, Discussed with Patient

## 2017-02-11 ENCOUNTER — Encounter
Admission: RE | Admit: 2017-02-11 | Discharge: 2017-02-11 | Disposition: A | Discharge status: Home / Self Care | Payer: PPO | Source: Ambulatory Visit | Attending: General Surgery | Admitting: General Surgery

## 2017-02-11 DIAGNOSIS — Z01810 Encounter for preprocedural cardiovascular examination: Secondary | ICD-10-CM | POA: Diagnosis not present

## 2017-02-11 DIAGNOSIS — S2242XA Multiple fractures of ribs, left side, initial encounter for closed fracture: Secondary | ICD-10-CM | POA: Diagnosis not present

## 2017-02-11 DIAGNOSIS — Z96641 Presence of right artificial hip joint: Secondary | ICD-10-CM | POA: Diagnosis not present

## 2017-02-11 DIAGNOSIS — N201 Calculus of ureter: Secondary | ICD-10-CM | POA: Diagnosis not present

## 2017-02-11 DIAGNOSIS — Z01818 Encounter for other preprocedural examination: Secondary | ICD-10-CM | POA: Diagnosis not present

## 2017-02-11 DIAGNOSIS — I700 Atherosclerosis of aorta: Secondary | ICD-10-CM | POA: Diagnosis not present

## 2017-02-11 DIAGNOSIS — C50412 Malignant neoplasm of upper-outer quadrant of left female breast: Secondary | ICD-10-CM | POA: Diagnosis not present

## 2017-02-11 HISTORY — DX: Personal history of urinary calculi: Z87.442

## 2017-02-11 HISTORY — DX: Gastro-esophageal reflux disease without esophagitis: K21.9

## 2017-02-11 HISTORY — DX: Fibromyalgia: M79.7

## 2017-02-11 NOTE — Progress Notes (Signed)
  Oncology Nurse Navigator Documentation  Navigator Location: [facility]-Med Onc (02/11/17 1400)   )Navigator Encounter Type: Telephone (02/11/17 1400) Telephone: Incoming Call;Outgoing Call (02/11/17 1400)                     Treatment Phase: Pre-Tx/Tx Discussion (02/11/17 1400) Barriers/Navigation Needs: Education (02/11/17 1400) Education: Preparing for Upcoming Surgery/ Treatment (02/11/17 1400) Interventions: Education (Medications) (02/11/17 1400)     Education Method: Teach-back;Verbal (02/11/17 1400)                Time Spent with Patient: 15 (02/11/17 1400)   Patient called with questions about medications called in to her pharmacy, Zofran, Compazine, and Emla cream.  Explained purpose of medications, and that she will receive more education at chemotherapy class prior to her first treatment.  Confirmed upcoming appointments.

## 2017-02-11 NOTE — Patient Instructions (Signed)
  Your procedure is scheduled on: 02-15-17 (Monday) Report to Same Day Surgery 2nd floor medical mall [facility] Entrance-take elevator on left to 2nd floor.  Check in with surgery information desk.) To find out your arrival time please call [phone] between 1PM - 3PM on 02-12-17 (Friday)  Remember: Instructions that are not followed completely may result in serious medical risk, up to and including death, or upon the discretion of your surgeon and anesthesiologist your surgery may need to be rescheduled.    _x___ 1. Do not eat food or drink liquids after midnight. No gum chewing or hard candies.     __x__ 2. No Alcohol for 24 hours before or after surgery.   __x__3. No Smoking for 24 prior to surgery.   ____  4. Bring all medications with you on the day of surgery if instructed.    __x__ 5. Notify your doctor if there is any change in your medical condition     (cold, fever, infections).     Do not wear jewelry, make-up, hairpins, clips or nail polish.  Do not wear lotions, powders, or perfumes. You may wear deodorant.  Do not shave 48 hours prior to surgery. Men may shave face and neck.  Do not bring valuables to the hospital.    [facility] is not responsible for any belongings or valuables.               Contacts, dentures or bridgework may not be worn into surgery.  Leave your suitcase in the car. After surgery it may be brought to your room.  For patients admitted to the hospital, discharge time is determined by your treatment team.   Patients discharged the day of surgery will not be allowed to drive home.  You will need someone to drive you home and stay with you the night of your procedure.    Please read over the following fact sheets that you were given:    _x___ [place] WITH A SMALL SIP OF WATER. These include:  1. LEVOTHYROXINE  2.  3.  4.  5.  6.  ____Fleets enema or Magnesium Citrate as directed.   _x___ Use CHG  Soap or sage wipes as directed on instruction sheet   ____ Use inhalers on the day of surgery and bring to hospital day of surgery  ____ Stop Metformin and Janumet 2 days prior to surgery.    ____ Take 1/2 of usual insulin dose the night before surgery and none on the morning surgery.   ____ Follow recommendations from Cardiologist, Pulmonologist or PCP regarding stopping Aspirin, Coumadin, Pllavix ,Eliquis, Effient, or Pradaxa, and Pletal-PT ALREADY STOPPED ASPIRIN  X____Stop Anti-inflammatories such as Advil, Aleve, Ibuprofen, Motrin, Naproxen, Naprosyn, Goodies powders or aspirin products NOW-OK to take Tylenol and Celebrex.   ____ Stop supplements until after surgery.     ____ Bring C-Pap to the hospital.

## 2017-02-12 ENCOUNTER — Ambulatory Visit
Admission: RE | Admit: 2017-02-12 | Discharge: 2017-02-12 | Disposition: A | Discharge status: Home / Self Care | Payer: PPO | Source: Ambulatory Visit | Attending: Oncology | Admitting: Oncology

## 2017-02-12 ENCOUNTER — Ambulatory Visit: Admission: RE | Admit: 2017-02-12 | Payer: PPO | Source: Ambulatory Visit

## 2017-02-12 ENCOUNTER — Encounter
Admission: RE | Admit: 2017-02-12 | Discharge: 2017-02-12 | Disposition: A | Discharge status: Home / Self Care | Payer: PPO | Source: Ambulatory Visit | Attending: Oncology | Admitting: Oncology

## 2017-02-12 DIAGNOSIS — E039 Hypothyroidism, unspecified: Secondary | ICD-10-CM | POA: Diagnosis not present

## 2017-02-12 DIAGNOSIS — Z79899 Other long term (current) drug therapy: Secondary | ICD-10-CM | POA: Diagnosis not present

## 2017-02-12 DIAGNOSIS — C50412 Malignant neoplasm of upper-outer quadrant of left female breast: Secondary | ICD-10-CM | POA: Insufficient documentation

## 2017-02-12 DIAGNOSIS — C50912 Malignant neoplasm of unspecified site of left female breast: Secondary | ICD-10-CM | POA: Diagnosis not present

## 2017-02-12 DIAGNOSIS — Z87891 Personal history of nicotine dependence: Secondary | ICD-10-CM | POA: Diagnosis not present

## 2017-02-12 DIAGNOSIS — Z01810 Encounter for preprocedural cardiovascular examination: Secondary | ICD-10-CM | POA: Insufficient documentation

## 2017-02-12 DIAGNOSIS — S2242XA Multiple fractures of ribs, left side, initial encounter for closed fracture: Secondary | ICD-10-CM | POA: Insufficient documentation

## 2017-02-12 DIAGNOSIS — I700 Atherosclerosis of aorta: Secondary | ICD-10-CM | POA: Insufficient documentation

## 2017-02-12 DIAGNOSIS — Z791 Long term (current) use of non-steroidal anti-inflammatories (NSAID): Secondary | ICD-10-CM | POA: Diagnosis not present

## 2017-02-12 DIAGNOSIS — S2232XA Fracture of one rib, left side, initial encounter for closed fracture: Secondary | ICD-10-CM | POA: Diagnosis not present

## 2017-02-12 DIAGNOSIS — N201 Calculus of ureter: Secondary | ICD-10-CM | POA: Insufficient documentation

## 2017-02-12 DIAGNOSIS — Z01818 Encounter for other preprocedural examination: Secondary | ICD-10-CM | POA: Diagnosis not present

## 2017-02-12 DIAGNOSIS — Z96641 Presence of right artificial hip joint: Secondary | ICD-10-CM | POA: Insufficient documentation

## 2017-02-12 DIAGNOSIS — Z7982 Long term (current) use of aspirin: Secondary | ICD-10-CM | POA: Diagnosis not present

## 2017-02-12 DIAGNOSIS — Z170 Estrogen receptor positive status [ER+]: Secondary | ICD-10-CM | POA: Diagnosis not present

## 2017-02-12 DIAGNOSIS — C50919 Malignant neoplasm of unspecified site of unspecified female breast: Secondary | ICD-10-CM | POA: Diagnosis not present

## 2017-02-12 DIAGNOSIS — Z888 Allergy status to other drugs, medicaments and biological substances status: Secondary | ICD-10-CM | POA: Diagnosis not present

## 2017-02-12 DIAGNOSIS — Z803 Family history of malignant neoplasm of breast: Secondary | ICD-10-CM | POA: Diagnosis not present

## 2017-02-12 DIAGNOSIS — M069 Rheumatoid arthritis, unspecified: Secondary | ICD-10-CM | POA: Diagnosis not present

## 2017-02-12 LAB — POCT I-STAT CREATININE: CREATININE: 0.7 mg/dL (ref 0.44–1.00)

## 2017-02-12 MED ORDER — TECHNETIUM TC 99M-LABELED RED BLOOD CELLS IV KIT
22.7500 | PACK | Freq: Once | INTRAVENOUS | Status: AC | PRN
  Administered 2017-02-12: 22.75 via INTRAVENOUS

## 2017-02-12 MED ORDER — IOPAMIDOL (ISOVUE-300) INJECTION 61%
100.0000 mL | Freq: Once | INTRAVENOUS | Status: AC | PRN
  Administered 2017-02-12: 100 mL via INTRAVENOUS

## 2017-02-14 MED ORDER — CEFAZOLIN SODIUM-DEXTROSE 2-4 GM/100ML-% IV SOLN
2.0000 g | INTRAVENOUS | Status: AC
  Administered 2017-02-15: 2 g via INTRAVENOUS

## 2017-02-15 ENCOUNTER — Encounter
Admission: RE | Disposition: A | Discharge status: Home / Self Care | Payer: Self-pay | Source: Ambulatory Visit | Attending: General Surgery

## 2017-02-15 ENCOUNTER — Ambulatory Visit: Payer: PPO | Admitting: Anesthesiology

## 2017-02-15 ENCOUNTER — Other Ambulatory Visit: Payer: Self-pay | Admitting: Oncology

## 2017-02-15 ENCOUNTER — Encounter: Payer: Self-pay | Admitting: *Deleted

## 2017-02-15 ENCOUNTER — Ambulatory Visit: Payer: PPO

## 2017-02-15 ENCOUNTER — Ambulatory Visit
Admission: RE | Admit: 2017-02-15 | Discharge: 2017-02-15 | Disposition: A | Discharge status: Home / Self Care | Payer: PPO | Source: Ambulatory Visit | Attending: General Surgery | Admitting: General Surgery

## 2017-02-15 DIAGNOSIS — F329 Major depressive disorder, single episode, unspecified: Secondary | ICD-10-CM | POA: Diagnosis not present

## 2017-02-15 DIAGNOSIS — M069 Rheumatoid arthritis, unspecified: Secondary | ICD-10-CM | POA: Insufficient documentation

## 2017-02-15 DIAGNOSIS — Z7982 Long term (current) use of aspirin: Secondary | ICD-10-CM | POA: Insufficient documentation

## 2017-02-15 DIAGNOSIS — Z87891 Personal history of nicotine dependence: Secondary | ICD-10-CM | POA: Insufficient documentation

## 2017-02-15 DIAGNOSIS — C50412 Malignant neoplasm of upper-outer quadrant of left female breast: Secondary | ICD-10-CM | POA: Insufficient documentation

## 2017-02-15 DIAGNOSIS — Z171 Estrogen receptor negative status [ER-]: Secondary | ICD-10-CM | POA: Diagnosis not present

## 2017-02-15 DIAGNOSIS — C50919 Malignant neoplasm of unspecified site of unspecified female breast: Secondary | ICD-10-CM | POA: Diagnosis not present

## 2017-02-15 DIAGNOSIS — Z791 Long term (current) use of non-steroidal anti-inflammatories (NSAID): Secondary | ICD-10-CM | POA: Insufficient documentation

## 2017-02-15 DIAGNOSIS — Z803 Family history of malignant neoplasm of breast: Secondary | ICD-10-CM | POA: Insufficient documentation

## 2017-02-15 DIAGNOSIS — Z79899 Other long term (current) drug therapy: Secondary | ICD-10-CM | POA: Insufficient documentation

## 2017-02-15 DIAGNOSIS — E039 Hypothyroidism, unspecified: Secondary | ICD-10-CM | POA: Insufficient documentation

## 2017-02-15 DIAGNOSIS — Z95828 Presence of other vascular implants and grafts: Secondary | ICD-10-CM

## 2017-02-15 DIAGNOSIS — Z170 Estrogen receptor positive status [ER+]: Secondary | ICD-10-CM | POA: Insufficient documentation

## 2017-02-15 DIAGNOSIS — K219 Gastro-esophageal reflux disease without esophagitis: Secondary | ICD-10-CM | POA: Diagnosis not present

## 2017-02-15 DIAGNOSIS — Z888 Allergy status to other drugs, medicaments and biological substances status: Secondary | ICD-10-CM | POA: Insufficient documentation

## 2017-02-15 DIAGNOSIS — C50912 Malignant neoplasm of unspecified site of left female breast: Secondary | ICD-10-CM | POA: Diagnosis not present

## 2017-02-15 HISTORY — PX: PORTACATH PLACEMENT: SHX2246

## 2017-02-15 SURGERY — INSERTION, TUNNELED CENTRAL VENOUS DEVICE, WITH PORT
Anesthesia: Monitor Anesthesia Care | Laterality: Right | Wound class: Clean

## 2017-02-15 MED ORDER — FENTANYL CITRATE (PF) 100 MCG/2ML IJ SOLN: 25.0000 ug | INTRAMUSCULAR | Status: DC | PRN

## 2017-02-15 MED ORDER — ONDANSETRON HCL 4 MG/2ML IJ SOLN: 4.0000 mg | Freq: Once | INTRAMUSCULAR | Status: DC | PRN

## 2017-02-15 MED ORDER — LACTATED RINGERS IV SOLN
INTRAVENOUS | Status: DC
  Administered 2017-02-15: 09:00:00 via INTRAVENOUS

## 2017-02-15 MED ORDER — FAMOTIDINE 20 MG PO TABS
20.0000 mg | ORAL_TABLET | Freq: Once | ORAL | Status: AC
  Administered 2017-02-15: 20 mg via ORAL

## 2017-02-15 MED ORDER — MIDAZOLAM HCL 2 MG/2ML IJ SOLN
INTRAMUSCULAR | Status: DC | PRN
  Administered 2017-02-15: 2 mg via INTRAVENOUS

## 2017-02-15 MED ORDER — SODIUM CHLORIDE 0.9 % IJ SOLN
INTRAMUSCULAR | Status: DC | PRN
  Administered 2017-02-15: 10 mL

## 2017-02-15 MED ORDER — PROPOFOL 10 MG/ML IV BOLUS
INTRAVENOUS | Status: DC | PRN
  Administered 2017-02-15: 40 mg via INTRAVENOUS

## 2017-02-15 MED ORDER — TRAMADOL HCL 50 MG PO TABS: 50.0000 mg | ORAL_TABLET | ORAL | 0 refills | Status: DC | PRN

## 2017-02-15 MED ORDER — LIDOCAINE HCL (PF) 1 % IJ SOLN
INTRAMUSCULAR | Status: DC | PRN
  Administered 2017-02-15: 10 mL

## 2017-02-15 MED ORDER — FAMOTIDINE 20 MG PO TABS
ORAL_TABLET | ORAL | Status: AC
  Filled 2017-02-15: qty 1

## 2017-02-15 MED ORDER — CEFAZOLIN SODIUM-DEXTROSE 2-4 GM/100ML-% IV SOLN
INTRAVENOUS | Status: AC
  Filled 2017-02-15: qty 100

## 2017-02-15 MED ORDER — PROPOFOL 10 MG/ML IV BOLUS
INTRAVENOUS | Status: AC
  Filled 2017-02-15: qty 20

## 2017-02-15 MED ORDER — SODIUM CHLORIDE 0.9 % IJ SOLN
INTRAMUSCULAR | Status: AC
  Filled 2017-02-15: qty 50

## 2017-02-15 MED ORDER — MIDAZOLAM HCL 2 MG/2ML IJ SOLN
INTRAMUSCULAR | Status: AC
  Filled 2017-02-15: qty 2

## 2017-02-15 MED ORDER — FENTANYL CITRATE (PF) 100 MCG/2ML IJ SOLN
INTRAMUSCULAR | Status: DC | PRN
  Administered 2017-02-15: 50 ug via INTRAVENOUS

## 2017-02-15 MED ORDER — LIDOCAINE HCL (PF) 1 % IJ SOLN
INTRAMUSCULAR | Status: AC
  Filled 2017-02-15: qty 30

## 2017-02-15 MED ORDER — PROPOFOL 500 MG/50ML IV EMUL
INTRAVENOUS | Status: DC | PRN
  Administered 2017-02-15: 75 ug/kg/min via INTRAVENOUS

## 2017-02-15 SURGICAL SUPPLY — 28 items
BLADE SURG 15 STRL SS SAFETY (BLADE) ×3 IMPLANT
CHLORAPREP W/TINT 26ML (MISCELLANEOUS) ×3 IMPLANT
CLOSURE WOUND 1/2 X4 (GAUZE/BANDAGES/DRESSINGS) ×1
COVER LIGHT HANDLE STERIS (MISCELLANEOUS) ×6 IMPLANT
DECANTER SPIKE VIAL GLASS SM (MISCELLANEOUS) ×6 IMPLANT
DRAPE C-ARM XRAY 36X54 (DRAPES) ×3 IMPLANT
DRAPE LAPAROTOMY TRNSV 106X77 (MISCELLANEOUS) ×3 IMPLANT
DRSG TEGADERM 2-3/8X2-3/4 SM (GAUZE/BANDAGES/DRESSINGS) ×3 IMPLANT
DRSG TEGADERM 4X4.75 (GAUZE/BANDAGES/DRESSINGS) ×3 IMPLANT
DRSG TELFA 4X3 1S NADH ST (GAUZE/BANDAGES/DRESSINGS) ×3 IMPLANT
ELECT REM PT RETURN 9FT ADLT (ELECTROSURGICAL) ×3
ELECTRODE REM PT RTRN 9FT ADLT (ELECTROSURGICAL) ×1 IMPLANT
GLOVE BIO SURGEON STRL SZ7.5 (GLOVE) ×7 IMPLANT
GLOVE INDICATOR 8.0 STRL GRN (GLOVE) ×5 IMPLANT
GOWN STRL REUS W/ TWL LRG LVL3 (GOWN DISPOSABLE) ×2 IMPLANT
GOWN STRL REUS W/TWL LRG LVL3 (GOWN DISPOSABLE) ×6
KIT PORT POWER 8FR ISP CVUE (Catheter) ×3 IMPLANT
KIT RM TURNOVER STRD PROC AR (KITS) ×3 IMPLANT
LABEL OR SOLS (LABEL) ×3 IMPLANT
NS IRRIG 500ML POUR BTL (IV SOLUTION) ×3 IMPLANT
PACK PORT-A-CATH (MISCELLANEOUS) ×3 IMPLANT
STRIP CLOSURE SKIN 1/2X4 (GAUZE/BANDAGES/DRESSINGS) ×2 IMPLANT
SUT PROLENE 3 0 SH DA (SUTURE) ×3 IMPLANT
SUT VIC AB 3-0 SH 27 (SUTURE) ×3
SUT VIC AB 3-0 SH 27X BRD (SUTURE) ×1 IMPLANT
SUT VIC AB 4-0 FS2 27 (SUTURE) ×3 IMPLANT
SWABSTK COMLB BENZOIN TINCTURE (MISCELLANEOUS) ×3 IMPLANT
SYR 10ML SLIP (SYRINGE) ×3 IMPLANT

## 2017-02-15 NOTE — Progress Notes (Signed)
Samantha Valentine  [phone] Fax:(336) [phone]  ID: JUNIA NYGREN OB: [birth year]  MR#: [id number]  IDP#:[id number]  Patient Care Team: Abner Greenspan, MD as PCP - [place], MD as Consulting Physician (Family Medicine) Robert Bellow, MD (General Surgery)  CHIEF COMPLAINT: Clinical stage IB ER/PR positive, HER-2 negative invasive carcinoma of the upper-outer quadrant of the left breast.  INTERVAL HISTORY: Patient returns to clinic today for further evaluation and initiation of cycle 1 of Adriamycin and Cytoxan. She is anxious, but otherwise feels well. She continues to have significant joint pain from her rheumatoid arthritis. She has no neurologic complaints. She denies any recent fevers or illnesses. She has a good appetite and denies weight loss. She denies any chest pain or shortness of breath. She denies any nausea, vomiting, constipation, or diarrhea. She has no urinary complaints. Patient otherwise feels well and offers no further specific complaints.  REVIEW OF SYSTEMS:   Review of Systems  Constitutional: Negative.  Negative for fever, malaise/fatigue and weight loss.  Respiratory: Negative.  Negative for cough and shortness of breath.   Cardiovascular: Negative.  Negative for chest pain and leg swelling.  Gastrointestinal: Negative.  Negative for abdominal pain.  Genitourinary: Negative.   Musculoskeletal: Positive for joint pain.  Skin: Negative.   Neurological: Negative.  Negative for weakness.  Psychiatric/Behavioral: Negative.  The patient is not nervous/anxious.     As per HPI. Otherwise, a complete review of systems is negative.  PAST MEDICAL HISTORY: Past Medical History:  Diagnosis Date  . Arthritis    RA  . Breast mass 1 year   . Cancer ([place]) 01/29/2017   left breast INVASIVE MAMMARY CARCINOMA, ER/PR positive  . Collagen vascular disease (HCC)    Rhematoid Arthritis  . DDD (degenerative disc disease)    in neck  .  Depression   . Fibromyalgia   . GERD (gastroesophageal reflux disease)    NO MEDS  . History of kidney stones    MULTIPLE KIDNEY STONES BIL  . Hypothyroidism   . Migraine   . Osteoporosis     PAST SURGICAL HISTORY: Past Surgical History:  Procedure Laterality Date  . BREAST BIOPSY Left 01/29/2017   US guided biopsy INVASIVE MAMMARY CARCINOMA  . JOINT REPLACEMENT  2003   total hip replacement  . LITHOTRIPSY  1997   kidney stone  . PORTACATH PLACEMENT Right 02/15/2017   Procedure: INSERTION PORT-A-CATH;  Surgeon: Robert Bellow, MD;  Location: [facility] ORS;  Service: General;  Laterality: Right;  . TONSILLECTOMY  1970    FAMILY HISTORY: Family History  Problem Relation Age of Onset  . Alcohol abuse Father   . Diabetes Father   . Stroke Father   . Hypertension Mother   . Endometrial cancer Mother   . Osteoarthritis Mother   . Breast cancer Paternal Aunt 67  . Liver disease Sister   . Breast cancer Maternal Aunt   . Rheum arthritis Maternal Grandmother   . Diabetes Paternal Grandmother   . Parkinson's disease Paternal Grandfather     ADVANCED DIRECTIVES (Y/N):  N  HEALTH MAINTENANCE: Social History  Substance Use Topics  . Smoking status: Former Smoker    Packs/day: 1.50    Years: 25.00    Types: Cigarettes    Quit date: 10/20/1983  . Smokeless tobacco: Never Used  . Alcohol use No     Colonoscopy:  PAP:  Bone density:  Lipid panel:  Allergies  Allergen Reactions  . Hydroxychloroquine  Sulfate     REACTION: rash  . Ibandronate Sodium     REACTION: rash  . Risedronate Sodium     REACTION: rash    Current Outpatient Prescriptions  Medication Sig Dispense Refill  . aspirin EC 81 MG tablet Take 81 mg by mouth daily.    . CELEBREX 200 MG capsule Take 200 mg by mouth 2 (two) times daily.     . Cholecalciferol 1000 units TBDP Take 1,000 Units by mouth daily.    Marland Kitchen levothyroxine (SYNTHROID, LEVOTHROID) 137 MCG tablet Take 1 tablet (137 mcg total) by mouth  daily before breakfast. 30 tablet 11  . lidocaine-prilocaine (EMLA) cream Apply to affected area once 30 g 3  . ondansetron (ZOFRAN) 8 MG tablet Take 1 tablet (8 mg total) by mouth 2 (two) times daily as needed. 60 tablet 2  . predniSONE (DELTASONE) 5 MG tablet Take 4 tab QD PO x 4 days, then 3 tab QD x 4 days, then 2 tab QD x 4 days, then 1 tab QD x 4 days, then stop 40 tablet 0  . prochlorperazine (COMPAZINE) 10 MG tablet Take 1 tablet (10 mg total) by mouth every 6 (six) hours as needed (Nausea or vomiting). 60 tablet 2  . traMADol (ULTRAM) 50 MG tablet Take 1 tablet (50 mg total) by mouth every 4 (four) hours as needed. 15 tablet 0   No current facility-administered medications for this visit.     OBJECTIVE: Vitals:   02/16/17 1159  BP: (!) 148/91  Pulse: 87  Temp: 99.2 F (37.3 C)     Body mass index is 31.26 kg/m.    ECOG FS:0 - Asymptomatic  General: Well-developed, well-nourished, no acute distress. Eyes: Pink conjunctiva, anicteric sclera. Breasts: Easily palpable left breast mass. Lungs: Clear to auscultation bilaterally. Heart: Regular rate and rhythm. No rubs, murmurs, or gallops. Abdomen: Soft, nontender, nondistended. No organomegaly noted, normoactive bowel sounds. Musculoskeletal: No edema, cyanosis, or clubbing. Neuro: Alert, answering all questions appropriately. Cranial nerves grossly intact. Skin: No rashes or petechiae noted. Psych: Normal affect.   LAB RESULTS:  Lab Results  Component Value Date   NA 137 02/16/2017   K 3.9 02/16/2017   CL 106 02/16/2017   CO2 25 02/16/2017   GLUCOSE 116 (H) 02/16/2017   BUN 16 02/16/2017   CREATININE 0.60 02/16/2017   CALCIUM 8.8 (L) 02/16/2017   PROT 6.9 02/16/2017   ALBUMIN 3.3 (L) 02/16/2017   AST 23 02/16/2017   ALT 15 02/16/2017   ALKPHOS 83 02/16/2017   BILITOT 0.6 02/16/2017   GFRNONAA >60 02/16/2017   GFRAA >60 02/16/2017    Lab Results  Component Value Date   WBC 8.7 02/16/2017   NEUTROABS 6.0  02/16/2017   HGB 13.9 02/16/2017   HCT 40.7 02/16/2017   MCV 87.8 02/16/2017   PLT 276 02/16/2017     STUDIES: Ct Chest W Contrast  Result Date: 02/12/2017 CLINICAL DATA:  Newly diagnosed left breast cancer. Biopsy 01/29/2017. Left flank pain for 3 weeks. Prior lithotripsy for kidney stones and right total hip arthroplasty. EXAM: CT CHEST, ABDOMEN, AND PELVIS WITH CONTRAST TECHNIQUE: Multidetector CT imaging of the chest, abdomen and pelvis was performed following the standard protocol during bolus administration of intravenous contrast. CONTRAST:  149m ISOVUE-300 IOPAMIDOL (ISOVUE-300) INJECTION 61% COMPARISON:  01/10/2016 abdominopelvic CT. 10/25/2013 chest radiograph. FINDINGS: CT CHEST FINDINGS Cardiovascular: Mild dilatation of the ascending aorta at 4.2 cm on image 28/series 2. Normal caliber transverse and descending segments. Tortuous thoracic aorta.  Mild cardiomegaly, without pericardial effusion. Left atrial enlargement. No central pulmonary embolism, on this non-dedicated study. Mediastinum/Nodes: No supraclavicular adenopathy. No mediastinal or hilar adenopathy. Tiny hiatal hernia. Lungs/Pleura: No pleural fluid. Mild centrilobular emphysema. Mild biapical pleural-parenchymal scarring. Musculoskeletal: No axillary adenopathy. Left breast primary measures 2.6 x 3.2 cm and is in the central left breast on image 24/series 2. Remote lower left rib fractures are likely new since the prior exam. Example ribs 6 through 8. T9-10 calcified disc extrusion is eccentric left and causes significant canal narrowing, including on sagittal image 105. CT ABDOMEN PELVIS FINDINGS Hepatobiliary: Hypoattenuation in the central hepatic dome measures 9 mm on image 49/series 2 and may represent a non-opacified (secondary to bolus timing) hepatic vein branch. Normal gallbladder, without biliary ductal dilatation. Pancreas: Normal, without mass or ductal dilatation. Spleen: Normal in size, without focal abnormality.  Adrenals/Urinary Tract: Normal right adrenal gland. Minimal left adrenal nodularity is felt to be similar an 8 mm. A stone within the right ureteropelvic junction measures 8 mm on image 72/series 2 and results in an mild caliectasis. Degraded evaluation of the pelvis, secondary to beam hardening artifact from right hip arthroplasty. Grossly normal bladder. Stomach/Bowel: Normal remainder of the stomach. Colonic stool burden suggests constipation. The cecum is redundant with the ileocecal junction positioned in the midline, including on image 80/series 2. Normal small bowel. Vascular/Lymphatic: Aortic and branch vessel atherosclerosis. No abdominopelvic adenopathy. Reproductive: Normal uterus and adnexa. Other: No significant free fluid. Mild pelvic floor laxity. No evidence of omental or peritoneal disease. Musculoskeletal: Right hip arthroplasty. Prominent disc bulges at L3-4 and L2-3. IMPRESSION: 1. Left breast primary, without evidence of metastatic disease in the chest. 2. Subtle hypoattenuation in the central liver. Although this could represent relatively under opacified hepatic vein branch, a new lesion cannot be excluded. Consider further evaluation with pre and post contrast abdominal MRI. 3. Partially obstructive stone at the right ureteropelvic junction. 4. Degraded evaluation of the pelvis, secondary to beam hardening artifact from right hip arthroplasty. 5. Calcified T9-10 disc causing significant central and left paracentral canal narrowing. Correlate with symptoms and possibly thoracic spine MRI. 6. Anterior lower left rib fractures, nonacute but felt to be new since 01/10/2016. 7.  Aortic atherosclerosis. 8.  Possible constipation. Electronically Signed   By: Abigail Miyamoto M.D.   On: 02/12/2017 17:31   Nm Cardiac Muga Rest  Result Date: 02/12/2017 CLINICAL DATA:  LEFT breast cancer, pre cardiotoxic chemotherapy EXAM: NUCLEAR MEDICINE CARDIAC BLOOD POOL IMAGING (MUGA) TECHNIQUE: Cardiac multi-gated  acquisition was performed at rest following intravenous injection of Tc-58mlabeled red blood cells. RADIOPHARMACEUTICALS:  22.75 mCi Tc-945mertechnetate in-vitro labeled autologous red blood cells IV COMPARISON:  None FINDINGS: Calculated LEFT ventricular ejection fraction of 75%, at the upper end of the normal range. Study was obtained at a heart rate of 75 beats per minute. Patient was rhythmic during imaging. Normal LEFT ventricular wall motion identified on cine analysis of the gated blood pool in 3 projections. IMPRESSION: Upper normal LEFT ventricular ejection fraction of 75%. Normal LV wall motion. Electronically Signed   By: MaLavonia Dana.D.   On: 02/12/2017 13:46   Ct Abdomen Pelvis W Contrast  Result Date: 02/12/2017 CLINICAL DATA:  Newly diagnosed left breast cancer. Biopsy 01/29/2017. Left flank pain for 3 weeks. Prior lithotripsy for kidney stones and right total hip arthroplasty. EXAM: CT CHEST, ABDOMEN, AND PELVIS WITH CONTRAST TECHNIQUE: Multidetector CT imaging of the chest, abdomen and pelvis was performed following the standard protocol during  bolus administration of intravenous contrast. CONTRAST:  173m ISOVUE-300 IOPAMIDOL (ISOVUE-300) INJECTION 61% COMPARISON:  01/10/2016 abdominopelvic CT. 10/25/2013 chest radiograph. FINDINGS: CT CHEST FINDINGS Cardiovascular: Mild dilatation of the ascending aorta at 4.2 cm on image 28/series 2. Normal caliber transverse and descending segments. Tortuous thoracic aorta. Mild cardiomegaly, without pericardial effusion. Left atrial enlargement. No central pulmonary embolism, on this non-dedicated study. Mediastinum/Nodes: No supraclavicular adenopathy. No mediastinal or hilar adenopathy. Tiny hiatal hernia. Lungs/Pleura: No pleural fluid. Mild centrilobular emphysema. Mild biapical pleural-parenchymal scarring. Musculoskeletal: No axillary adenopathy. Left breast primary measures 2.6 x 3.2 cm and is in the central left breast on image 24/series 2.  Remote lower left rib fractures are likely new since the prior exam. Example ribs 6 through 8. T9-10 calcified disc extrusion is eccentric left and causes significant canal narrowing, including on sagittal image 105. CT ABDOMEN PELVIS FINDINGS Hepatobiliary: Hypoattenuation in the central hepatic dome measures 9 mm on image 49/series 2 and may represent a non-opacified (secondary to bolus timing) hepatic vein branch. Normal gallbladder, without biliary ductal dilatation. Pancreas: Normal, without mass or ductal dilatation. Spleen: Normal in size, without focal abnormality. Adrenals/Urinary Tract: Normal right adrenal gland. Minimal left adrenal nodularity is felt to be similar an 8 mm. A stone within the right ureteropelvic junction measures 8 mm on image 72/series 2 and results in an mild caliectasis. Degraded evaluation of the pelvis, secondary to beam hardening artifact from right hip arthroplasty. Grossly normal bladder. Stomach/Bowel: Normal remainder of the stomach. Colonic stool burden suggests constipation. The cecum is redundant with the ileocecal junction positioned in the midline, including on image 80/series 2. Normal small bowel. Vascular/Lymphatic: Aortic and branch vessel atherosclerosis. No abdominopelvic adenopathy. Reproductive: Normal uterus and adnexa. Other: No significant free fluid. Mild pelvic floor laxity. No evidence of omental or peritoneal disease. Musculoskeletal: Right hip arthroplasty. Prominent disc bulges at L3-4 and L2-3. IMPRESSION: 1. Left breast primary, without evidence of metastatic disease in the chest. 2. Subtle hypoattenuation in the central liver. Although this could represent relatively under opacified hepatic vein branch, a new lesion cannot be excluded. Consider further evaluation with pre and post contrast abdominal MRI. 3. Partially obstructive stone at the right ureteropelvic junction. 4. Degraded evaluation of the pelvis, secondary to beam hardening artifact from  right hip arthroplasty. 5. Calcified T9-10 disc causing significant central and left paracentral canal narrowing. Correlate with symptoms and possibly thoracic spine MRI. 6. Anterior lower left rib fractures, nonacute but felt to be new since 01/10/2016. 7.  Aortic atherosclerosis. 8.  Possible constipation. Electronically Signed   By: KAbigail MiyamotoM.D.   On: 02/12/2017 17:31   Dg Chest Port 1 View  Result Date: 02/15/2017 CLINICAL DATA:  Porta catheter placement.  Breast cancer. EXAM: PORTABLE CHEST 1 VIEW COMPARISON:  Chest CT 3 days ago FINDINGS: Subclavian porta catheter on the right with tip at the SVC level. No visible pneumothorax or mediastinal widening. Normal heart size.  No pleural effusion or pulmonary edema. IMPRESSION: New porta catheter with tip at the SVC level.  No acute finding. Electronically Signed   By: JMonte FantasiaM.D.   On: 02/15/2017 10:38   Dg C-arm 1-60 Min-no Report  Result Date: 02/15/2017 Fluoroscopy was utilized by the requesting physician.  No radiographic interpretation.   Mm Clip Placement Left  Result Date: 01/29/2017 CLINICAL DATA:  Post biopsy mammogram of the left breast for clip placement. EXAM: DIAGNOSTIC LEFT MAMMOGRAM POST ULTRASOUND BIOPSY COMPARISON:  Previous exam(s). FINDINGS: Mammographic images were obtained following  ultrasound guided biopsy of left breast mass at 2 o'clock. The wing shaped biopsy marking clip is appropriately positioned within the biopsied mass in the upper-outer left breast. IMPRESSION: Appropriate positioning of the wing shaped biopsy marking clip in the mass in the upper-outer left breast. Final Assessment: Post Procedure Mammograms for Marker Placement Electronically Signed   By: Ammie Ferrier M.D.   On: 01/29/2017 09:17   [place] Lt Breast Bx W Loc Dev 1st Lesion Img Bx Spec US Guide  Addendum Date: 02/05/2017   ADDENDUM REPORT: 02/05/2017 10:28 ADDENDUM: PATHOLOGY: Grade 2 invasive mammary carcinoma, no special type.  CONCORDANT: Yes I telephoned the patient on 02/01/2017 at 3:25 p.m. and discussed these results and the recommendations stated below. All questions were answered. The patient denies significant pain or bleeding from the biopsy site. Biopsy site care instructions were reviewed and the patient was asked to call [facility] with any questions or issues related to the biopsy. RECOMMENDATION: Surgical excision and multidisciplinary consultation recommended. The patient has rheumatoid arthritis and is concerned about the combination of treatment of breast cancer and her rheumatoid disease, and is inclined to not undergo treatment for her breast cancer because of this. I advised her to at least have consultations with potential treating physicians to determine her options before making any final decisions. The patient had a surgical consultation with Dr. Bary Castilla on 02/04/2017. Electronically Signed   By: Ammie Ferrier M.D.   On: 02/05/2017 10:28   Result Date: 02/05/2017 CLINICAL DATA:  71 year old female presenting for ultrasound-guided biopsy of a left breast mass. EXAM: ULTRASOUND GUIDED LEFT BREAST CORE NEEDLE BIOPSY COMPARISON:  Previous exam(s). FINDINGS: I met with the patient and we discussed the procedure of ultrasound-guided biopsy, including benefits and alternatives. We discussed the high likelihood of a successful procedure. We discussed the risks of the procedure, including infection, bleeding, tissue injury, clip migration, and inadequate sampling. Informed written consent was given. The usual time-out protocol was performed immediately prior to the procedure. Lesion quadrant: Upper-outer Using sterile technique and 1% Lidocaine as local anesthetic, under direct ultrasound visualization, a 14 gauge spring-loaded device was used to perform biopsy of mass in the left breast at 2 o'clock using an inferolateral approach. At the conclusion of the procedure a wing shaped tissue marker clip was  deployed into the biopsy cavity. Follow up 2 view mammogram was performed and dictated separately. IMPRESSION: Ultrasound guided biopsy of left breast mass at 2 o'clock. No apparent complications. Electronically Signed: By: Ammie Ferrier M.D. On: 01/29/2017 09:21    ASSESSMENT: Clinical stage IB ER/PR positive, HER-2 negative invasive carcinoma of the upper-outer quadrant of the left breast  PLAN:    1. Clinical stage IB ER/PR positive, HER-2 negative invasive carcinoma of the upper-outer quadrant of the left breast: Given the size of patient's tumor, have recommended neoadjuvant chemotherapy. Patient will receive dose dense Adriamycin and Cytoxan followed by 12 weekly cycles of Taxol. Once patient completes chemotherapy, she will require lumpectomy or mastectomy. She will then require adjuvant XRT depending on the type of surgery she chooses. Finally, patient will require an aromatase inhibitor for 5 years given the ER/PR positivity of her disease. Pretreatment MUGA on February 12, 2017 is adequate to proceed with an EF of 75%. CT of the chest, abdomen, pelvis did not reveal any evidence of metastatic disease. Patient will return tomorrow to receive cycle 1 of 4 of Adriamycin and Cytoxan with Neulasta support. She will return to clinic in 1 week for  laboratory work and further evaluation and then in 2 weeks for consideration of cycle 2. 2. Rheumatoid arthritis: Patient has discontinued Enbrel since the diagnosis of her malignancy. Since this is a solid tumor, okay to restart from an oncology standpoint once chemotherapy has been completed.   Approximately 30 minutes was spent in discussion of which greater than 50% was consultation.  Patient expressed understanding and was in agreement with this plan. She also understands that She can call clinic at any time with any questions, concerns, or complaints.   Cancer Staging Malignant neoplasm of upper-outer quadrant of left breast in female, estrogen  receptor negative ([place]) Staging form: Breast, AJCC 8th Edition - Clinical stage from 02/04/2017: Stage IB (cT2, cN0, cM0, G2, ER: Positive, PR: Positive, HER2: Negative) - Signed by Lloyd Huger, MD on 02/15/2017   Lloyd Huger, MD   02/16/2017 3:58 PM

## 2017-02-15 NOTE — H&P (Signed)
No change in clinical condition or exam.  

## 2017-02-15 NOTE — Op Note (Signed)
Preoperative diagnosis: Left breast cancer requiring neoadjuvant chemotherapy.  Postoperative diagnosis: Same.  Operative procedure: Right subclavian PowerPort placement with ultrasound and fluoroscopic guidance.  Following surgeon: Ollen Bowl, M.D.  Anesthesia: Gen. by LMA, 10 mL 1% plain Xylocaine.  Estimated blood loss: Less than 5 mL.  Clinical note this 71 year old woman was recently diagnosed with breast cancer and is a candidate for neoadjuvant chemotherapy. Central venous access was requested by the treating oncologist.  Operative note: With the patient under adequate sedation the right chest was prepped with ChloraPrep and draped. She received Kefzol prior to procedure. Ultrasound was used to confirm patency of the subclavian vein. This was cannulated under ultrasound guidance after instillation of the above-mentioned local anesthetic. The guidewire was passed followed by the port. This was positioned at the junction of the SVC and right atrium. It easily irrigated and aspirated in this location. The port catheter was tunneled to a pocket on the right anterior chest wall. The port was anchored to the deep tissue with interrupted 3-0 Prolene sutures. Final irrigation and aspiration was completed. The wound was closed in layers with a running 3-0 Vicryl suture to the adipose layer and a running 4-0 Vicryl septic suture for the skin. Benzoin, Steri-Strips, Telfa and dressings were applied.  A rectal chest x-ray obtained in the recovery room showed no evidence of pneumothorax and the catheter tip as described above.

## 2017-02-15 NOTE — Transfer of Care (Signed)
Immediate Anesthesia Transfer of Care Note  Patient: Samantha Valentine  Procedure(s) Performed: Procedure(s): INSERTION PORT-A-CATH (Right)  Patient Location: PACU  Anesthesia Type:MAC  Level of Consciousness: sedated and responds to stimulation  Airway & Oxygen Therapy: Patient Spontanous Breathing and Patient connected to face mask oxygen  Post-op Assessment: Report given to RN and Post -op Vital signs reviewed and stable  Post vital signs: Reviewed and stable  Last Vitals:  Vitals:   02/15/17 0842 02/15/17 1014  BP: 133/80 (!) 117/58  Pulse: 80 80  Resp: 16 13  Temp: 36.6 C     Last Pain:  Vitals:   02/15/17 0842  TempSrc: Tympanic  PainSc: 4          Complications: No apparent anesthesia complications

## 2017-02-15 NOTE — Anesthesia Procedure Notes (Signed)
Procedure Name: MAC Performed by: Lance Muss Pre-anesthesia Checklist: Patient identified, Emergency Drugs available, Suction available, Patient being monitored and Timeout performed Patient Re-evaluated:Patient Re-evaluated prior to inductionOxygen Delivery Method: Simple face mask Preoxygenation: Pre-oxygenation with 100% oxygen

## 2017-02-15 NOTE — Anesthesia Post-op Follow-up Note (Cosign Needed)
Anesthesia QCDR form completed.        

## 2017-02-15 NOTE — Progress Notes (Signed)
Ice pack to right chest

## 2017-02-15 NOTE — Anesthesia Postprocedure Evaluation (Signed)
Anesthesia Post Note  Patient: Samantha Valentine  Procedure(s) Performed: Procedure(s) (LRB): INSERTION PORT-A-CATH (Right)  Patient location during evaluation: PACU Anesthesia Type: MAC Level of consciousness: awake and alert Pain management: pain level controlled Vital Signs Assessment: post-procedure vital signs reviewed and stable Respiratory status: spontaneous breathing, nonlabored ventilation, respiratory function stable and patient connected to nasal cannula oxygen Cardiovascular status: stable and blood pressure returned to baseline Anesthetic complications: no     Last Vitals:  Vitals:   02/15/17 1044 02/15/17 1056  BP: 140/83 140/68  Pulse: 82 83  Resp: (!) 21 14  Temp: 36.6 C (!) 35.9 C    Last Pain:  Vitals:   02/15/17 1056  TempSrc: Temporal  PainSc:                  [name] S

## 2017-02-15 NOTE — Anesthesia Preprocedure Evaluation (Signed)
Anesthesia Evaluation  Patient identified by MRN, date of birth, ID band Patient awake    Reviewed: Allergy & Precautions, NPO status , Patient's Chart, lab work & pertinent test results, reviewed documented beta blocker date and time   Airway Mallampati: II  TM Distance: >3 FB     Dental  (+) Chipped   Pulmonary former smoker,           Cardiovascular      Neuro/Psych  Headaches, PSYCHIATRIC DISORDERS Depression    GI/Hepatic GERD  Controlled,  Endo/Other  Hypothyroidism   Renal/GU      Musculoskeletal  (+) Arthritis , Fibromyalgia -  Abdominal   Peds  Hematology   Anesthesia Other Findings   Reproductive/Obstetrics                             Anesthesia Physical Anesthesia Plan  ASA: III  Anesthesia Plan: MAC   Post-op Pain Management:    Induction:   Airway Management Planned:   Additional Equipment:   Intra-op Plan:   Post-operative Plan:   Informed Consent: I have reviewed the patients History and Physical, chart, labs and discussed the procedure including the risks, benefits and alternatives for the proposed anesthesia with the patient or authorized representative who has indicated his/her understanding and acceptance.     Plan Discussed with: CRNA  Anesthesia Plan Comments:         Anesthesia Quick Evaluation

## 2017-02-16 ENCOUNTER — Inpatient Hospital Stay: Payer: PPO

## 2017-02-16 ENCOUNTER — Encounter: Payer: Self-pay | Admitting: Oncology

## 2017-02-16 ENCOUNTER — Inpatient Hospital Stay (HOSPITAL_BASED_OUTPATIENT_CLINIC_OR_DEPARTMENT_OTHER): Payer: PPO | Admitting: Oncology

## 2017-02-16 ENCOUNTER — Inpatient Hospital Stay: Payer: PPO | Attending: Oncology

## 2017-02-16 VITALS — BP 148/91 | HR 87 | Temp 99.2°F | Wt 182.1 lb

## 2017-02-16 DIAGNOSIS — R634 Abnormal weight loss: Secondary | ICD-10-CM | POA: Insufficient documentation

## 2017-02-16 DIAGNOSIS — D61810 Antineoplastic chemotherapy induced pancytopenia: Secondary | ICD-10-CM | POA: Diagnosis not present

## 2017-02-16 DIAGNOSIS — R5383 Other fatigue: Secondary | ICD-10-CM | POA: Insufficient documentation

## 2017-02-16 DIAGNOSIS — D701 Agranulocytosis secondary to cancer chemotherapy: Secondary | ICD-10-CM | POA: Insufficient documentation

## 2017-02-16 DIAGNOSIS — Z87442 Personal history of urinary calculi: Secondary | ICD-10-CM

## 2017-02-16 DIAGNOSIS — C50412 Malignant neoplasm of upper-outer quadrant of left female breast: Secondary | ICD-10-CM | POA: Diagnosis not present

## 2017-02-16 DIAGNOSIS — Z79899 Other long term (current) drug therapy: Secondary | ICD-10-CM | POA: Insufficient documentation

## 2017-02-16 DIAGNOSIS — Z170 Estrogen receptor positive status [ER+]: Secondary | ICD-10-CM | POA: Insufficient documentation

## 2017-02-16 DIAGNOSIS — R5381 Other malaise: Secondary | ICD-10-CM | POA: Diagnosis not present

## 2017-02-16 DIAGNOSIS — Z7982 Long term (current) use of aspirin: Secondary | ICD-10-CM | POA: Insufficient documentation

## 2017-02-16 DIAGNOSIS — Z5111 Encounter for antineoplastic chemotherapy: Secondary | ICD-10-CM | POA: Diagnosis not present

## 2017-02-16 DIAGNOSIS — R197 Diarrhea, unspecified: Secondary | ICD-10-CM | POA: Diagnosis not present

## 2017-02-16 DIAGNOSIS — M069 Rheumatoid arthritis, unspecified: Secondary | ICD-10-CM | POA: Diagnosis not present

## 2017-02-16 DIAGNOSIS — I700 Atherosclerosis of aorta: Secondary | ICD-10-CM

## 2017-02-16 DIAGNOSIS — Z808 Family history of malignant neoplasm of other organs or systems: Secondary | ICD-10-CM | POA: Insufficient documentation

## 2017-02-16 DIAGNOSIS — R110 Nausea: Secondary | ICD-10-CM | POA: Diagnosis not present

## 2017-02-16 DIAGNOSIS — Z7952 Long term (current) use of systemic steroids: Secondary | ICD-10-CM | POA: Insufficient documentation

## 2017-02-16 DIAGNOSIS — K219 Gastro-esophageal reflux disease without esophagitis: Secondary | ICD-10-CM | POA: Insufficient documentation

## 2017-02-16 DIAGNOSIS — M797 Fibromyalgia: Secondary | ICD-10-CM | POA: Diagnosis not present

## 2017-02-16 DIAGNOSIS — Z171 Estrogen receptor negative status [ER-]: Principal | ICD-10-CM

## 2017-02-16 DIAGNOSIS — R531 Weakness: Secondary | ICD-10-CM | POA: Diagnosis not present

## 2017-02-16 DIAGNOSIS — E039 Hypothyroidism, unspecified: Secondary | ICD-10-CM | POA: Diagnosis not present

## 2017-02-16 DIAGNOSIS — Z87891 Personal history of nicotine dependence: Secondary | ICD-10-CM

## 2017-02-16 DIAGNOSIS — M810 Age-related osteoporosis without current pathological fracture: Secondary | ICD-10-CM | POA: Diagnosis not present

## 2017-02-16 DIAGNOSIS — R630 Anorexia: Secondary | ICD-10-CM | POA: Diagnosis not present

## 2017-02-16 DIAGNOSIS — T451X5S Adverse effect of antineoplastic and immunosuppressive drugs, sequela: Secondary | ICD-10-CM | POA: Insufficient documentation

## 2017-02-16 DIAGNOSIS — Z803 Family history of malignant neoplasm of breast: Secondary | ICD-10-CM | POA: Insufficient documentation

## 2017-02-16 LAB — CBC WITH DIFFERENTIAL/PLATELET
BASOS ABS: 0.1 10*3/uL (ref 0–0.1)
BASOS PCT: 1 %
EOS ABS: 0.3 10*3/uL (ref 0–0.7)
EOS PCT: 3 %
HCT: 40.7 % (ref 35.0–47.0)
Hemoglobin: 13.9 g/dL (ref 12.0–16.0)
LYMPHS ABS: 1.6 10*3/uL (ref 1.0–3.6)
Lymphocytes Relative: 19 %
MCH: 30.1 pg (ref 26.0–34.0)
MCHC: 34.3 g/dL (ref 32.0–36.0)
MCV: 87.8 fL (ref 80.0–100.0)
MONOS PCT: 8 %
Monocytes Absolute: 0.7 10*3/uL (ref 0.2–0.9)
NEUTROS PCT: 69 %
Neutro Abs: 6 10*3/uL (ref 1.4–6.5)
PLATELETS: 276 10*3/uL (ref 150–440)
RBC: 4.63 MIL/uL (ref 3.80–5.20)
RDW: 14 % (ref 11.5–14.5)
WBC: 8.7 10*3/uL (ref 3.6–11.0)

## 2017-02-16 LAB — COMPREHENSIVE METABOLIC PANEL
ALBUMIN: 3.3 g/dL — AB (ref 3.5–5.0)
ALT: 15 U/L (ref 14–54)
AST: 23 U/L (ref 15–41)
Alkaline Phosphatase: 83 U/L (ref 38–126)
Anion gap: 6 (ref 5–15)
BUN: 16 mg/dL (ref 6–20)
CO2: 25 mmol/L (ref 22–32)
CREATININE: 0.6 mg/dL (ref 0.44–1.00)
Calcium: 8.8 mg/dL — ABNORMAL LOW (ref 8.9–10.3)
Chloride: 106 mmol/L (ref 101–111)
GFR calc Af Amer: >60 mL/min (ref >60)
GFR calc non Af Amer: >60 mL/min (ref >60)
Glucose, Bld: 116 mg/dL — ABNORMAL HIGH (ref 65–99)
POTASSIUM: 3.9 mmol/L (ref 3.5–5.1)
SODIUM: 137 mmol/L (ref 135–145)
Total Bilirubin: 0.6 mg/dL (ref 0.3–1.2)
Total Protein: 6.9 g/dL (ref 6.5–8.1)

## 2017-02-16 MED ORDER — HEPARIN SOD (PORK) LOCK FLUSH 100 UNIT/ML IV SOLN
500.0000 [IU] | Freq: Once | INTRAVENOUS | Status: DC
Start: 2017-02-16 — End: 2017-02-16

## 2017-02-16 MED ORDER — SODIUM CHLORIDE 0.9% FLUSH
10.0000 mL | INTRAVENOUS | Status: DC | PRN
  Filled 2017-02-16: qty 10

## 2017-02-16 NOTE — Patient Instructions (Signed)
Doxorubicin injection What is this medicine? DOXORUBICIN (dox oh ROO bi sin) is a chemotherapy drug. It is used to treat many kinds of cancer like leukemia, lymphoma, neuroblastoma, sarcoma, and Wilms' tumor. It is also used to treat bladder cancer, breast cancer, lung cancer, ovarian cancer, stomach cancer, and thyroid cancer. This medicine may be used for other purposes; ask your health care provider or pharmacist if you have questions. COMMON BRAND NAME(S): Adriamycin, Adriamycin PFS, Adriamycin RDF, Rubex What should I tell my health care provider before I take this medicine? They need to know if you have any of these conditions: -heart disease -history of low blood counts caused by a medicine -liver disease -recent or ongoing radiation therapy -an unusual or allergic reaction to doxorubicin, other chemotherapy agents, other medicines, foods, dyes, or preservatives -pregnant or trying to get pregnant -breast-feeding How should I use this medicine? This drug is given as an infusion into a vein. It is administered in a hospital or clinic by a specially trained health care professional. If you have pain, swelling, burning or any unusual feeling around the site of your injection, tell your health care professional right away. Talk to your pediatrician regarding the use of this medicine in children. Special care may be needed. Overdosage: If you think you have taken too much of this medicine contact a poison control center or emergency room at once. NOTE: This medicine is only for you. Do not share this medicine with others. What if I miss a dose? It is important not to miss your dose. Call your doctor or health care professional if you are unable to keep an appointment. What may interact with this medicine? This medicine may interact with the following medications: -6-mercaptopurine -paclitaxel -phenytoin -St. John's Wort -trastuzumab -verapamil This list may not describe all possible  interactions. Give your health care provider a list of all the medicines, herbs, non-prescription drugs, or dietary supplements you use. Also tell them if you smoke, drink alcohol, or use illegal drugs. Some items may interact with your medicine. What should I watch for while using this medicine? This drug may make you feel generally unwell. This is not uncommon, as chemotherapy can affect healthy cells as well as cancer cells. Report any side effects. Continue your course of treatment even though you feel ill unless your doctor tells you to stop. There is a maximum amount of this medicine you should receive throughout your life. The amount depends on the medical condition being treated and your overall health. Your doctor will watch how much of this medicine you receive in your lifetime. Tell your doctor if you have taken this medicine before. You may need blood work done while you are taking this medicine. Your urine may turn red for a few days after your dose. This is not blood. If your urine is dark or brown, call your doctor. In some cases, you may be given additional medicines to help with side effects. Follow all directions for their use. Call your doctor or health care professional for advice if you get a fever, chills or sore throat, or other symptoms of a cold or flu. Do not treat yourself. This drug decreases your body's ability to fight infections. Try to avoid being around people who are sick. This medicine may increase your risk to bruise or bleed. Call your doctor or health care professional if you notice any unusual bleeding. Talk to your doctor about your risk of cancer. You may be more at risk for certain   types of cancers if you take this medicine. Do not become pregnant while taking this medicine or for 6 months after stopping it. Women should inform their doctor if they wish to become pregnant or think they might be pregnant. Men should not father a child while taking this medicine and  for 6 months after stopping it. There is a potential for serious side effects to an unborn child. Talk to your health care professional or pharmacist for more information. Do not breast-feed an infant while taking this medicine. This medicine has caused ovarian failure in some women and reduced sperm counts in some men This medicine may interfere with the ability to have a child. Talk with your doctor or health care professional if you are concerned about your fertility. What side effects may I notice from receiving this medicine? Side effects that you should report to your doctor or health care professional as soon as possible: -allergic reactions like skin rash, itching or hives, swelling of the face, lips, or tongue -breathing problems -chest pain -fast or irregular heartbeat -low blood counts - this medicine may decrease the number of white blood cells, red blood cells and platelets. You may be at increased risk for infections and bleeding. -pain, redness, or irritation at site where injected -signs of infection - fever or chills, cough, sore throat, pain or difficulty passing urine -signs of decreased platelets or bleeding - bruising, pinpoint red spots on the skin, black, tarry stools, blood in the urine -swelling of the ankles, feet, hands -tiredness -weakness Side effects that usually do not require medical attention (report to your doctor or health care professional if they continue or are bothersome): -diarrhea -hair loss -mouth sores -nail discoloration or damage -nausea -red colored urine -vomiting This list may not describe all possible side effects. Call your doctor for medical advice about side effects. You may report side effects to FDA at 1-800-FDA-1088. Where should I keep my medicine? This drug is given in a hospital or clinic and will not be stored at home. NOTE: This sheet is a summary. It may not cover all possible information. If you have questions about this  medicine, talk to your doctor, pharmacist, or health care provider.  2018 Elsevier/Gold Standard (2015-12-02 11:28:51) Cyclophosphamide injection What is this medicine? CYCLOPHOSPHAMIDE (sye kloe FOSS fa mide) is a chemotherapy drug. It slows the growth of cancer cells. This medicine is used to treat many types of cancer like lymphoma, myeloma, leukemia, breast cancer, and ovarian cancer, to name a few. This medicine may be used for other purposes; ask your health care provider or pharmacist if you have questions. COMMON BRAND NAME(S): Cytoxan, Neosar What should I tell my health care provider before I take this medicine? They need to know if you have any of these conditions: -blood disorders -history of other chemotherapy -infection -kidney disease -liver disease -recent or ongoing radiation therapy -tumors in the bone marrow -an unusual or allergic reaction to cyclophosphamide, other chemotherapy, other medicines, foods, dyes, or preservatives -pregnant or trying to get pregnant -breast-feeding How should I use this medicine? This drug is usually given as an injection into a vein or muscle or by infusion into a vein. It is administered in a hospital or clinic by a specially trained health care professional. Talk to your pediatrician regarding the use of this medicine in children. Special care may be needed. Overdosage: If you think you have taken too much of this medicine contact a poison control center or emergency room at   once. NOTE: This medicine is only for you. Do not share this medicine with others. What if I miss a dose? It is important not to miss your dose. Call your doctor or health care professional if you are unable to keep an appointment. What may interact with this medicine? This medicine may interact with the following medications: -amiodarone -amphotericin B -azathioprine -certain antiviral medicines for HIV or AIDS such as protease inhibitors (e.g., indinavir,  ritonavir) and zidovudine -certain blood pressure medications such as benazepril, captopril, enalapril, fosinopril, lisinopril, moexipril, monopril, perindopril, quinapril, ramipril, trandolapril -certain cancer medications such as anthracyclines (e.g., daunorubicin, doxorubicin), busulfan, cytarabine, paclitaxel, pentostatin, tamoxifen, trastuzumab -certain diuretics such as chlorothiazide, chlorthalidone, hydrochlorothiazide, indapamide, metolazone -certain medicines that treat or prevent blood clots like warfarin -certain muscle relaxants such as succinylcholine -cyclosporine -etanercept -indomethacin -medicines to increase blood counts like filgrastim, pegfilgrastim, sargramostim -medicines used as general anesthesia -metronidazole -natalizumab This list may not describe all possible interactions. Give your health care provider a list of all the medicines, herbs, non-prescription drugs, or dietary supplements you use. Also tell them if you smoke, drink alcohol, or use illegal drugs. Some items may interact with your medicine. What should I watch for while using this medicine? Visit your doctor for checks on your progress. This drug may make you feel generally unwell. This is not uncommon, as chemotherapy can affect healthy cells as well as cancer cells. Report any side effects. Continue your course of treatment even though you feel ill unless your doctor tells you to stop. Drink water or other fluids as directed. Urinate often, even at night. In some cases, you may be given additional medicines to help with side effects. Follow all directions for their use. Call your doctor or health care professional for advice if you get a fever, chills or sore throat, or other symptoms of a cold or flu. Do not treat yourself. This drug decreases your body's ability to fight infections. Try to avoid being around people who are sick. This medicine may increase your risk to bruise or bleed. Call your doctor or  health care professional if you notice any unusual bleeding. Be careful brushing and flossing your teeth or using a toothpick because you may get an infection or bleed more easily. If you have any dental work done, tell your dentist you are receiving this medicine. You may get drowsy or dizzy. Do not drive, use machinery, or do anything that needs mental alertness until you know how this medicine affects you. Do not become pregnant while taking this medicine or for 1 year after stopping it. Women should inform their doctor if they wish to become pregnant or think they might be pregnant. Men should not father a child while taking this medicine and for 4 months after stopping it. There is a potential for serious side effects to an unborn child. Talk to your health care professional or pharmacist for more information. Do not breast-feed an infant while taking this medicine. This medicine may interfere with the ability to have a child. This medicine has caused ovarian failure in some women. This medicine has caused reduced sperm counts in some men. You should talk with your doctor or health care professional if you are concerned about your fertility. If you are going to have surgery, tell your doctor or health care professional that you have taken this medicine. What side effects may I notice from receiving this medicine? Side effects that you should report to your doctor or health care professional as   soon as possible: -allergic reactions like skin rash, itching or hives, swelling of the face, lips, or tongue -low blood counts - this medicine may decrease the number of white blood cells, red blood cells and platelets. You may be at increased risk for infections and bleeding. -signs of infection - fever or chills, cough, sore throat, pain or difficulty passing urine -signs of decreased platelets or bleeding - bruising, pinpoint red spots on the skin, black, tarry stools, blood in the urine -signs of  decreased red blood cells - unusually weak or tired, fainting spells, lightheadedness -breathing problems -dark urine -dizziness -palpitations -swelling of the ankles, feet, hands -trouble passing urine or change in the amount of urine -weight gain -yellowing of the eyes or skin Side effects that usually do not require medical attention (report to your doctor or health care professional if they continue or are bothersome): -changes in nail or skin color -hair loss -missed menstrual periods -mouth sores -nausea, vomiting This list may not describe all possible side effects. Call your doctor for medical advice about side effects. You may report side effects to FDA at 1-800-FDA-1088. Where should I keep my medicine? This drug is given in a hospital or clinic and will not be stored at home. NOTE: This sheet is a summary. It may not cover all possible information. If you have questions about this medicine, talk to your doctor, pharmacist, or health care provider.  2018 [employer]vier/Gold Standard (2012-08-19 16:22:58) Paclitaxel injection What is this medicine? PACLITAXEL (PAK li TAX el) is a chemotherapy drug. It targets fast dividing cells, like cancer cells, and causes these cells to die. This medicine is used to treat ovarian cancer, breast cancer, and other cancers. This medicine may be used for other purposes; ask your health care provider or pharmacist if you have questions. COMMON BRAND NAME(S): Onxol, Taxol What should I tell my health care provider before I take this medicine? They need to know if you have any of these conditions: -blood disorders -irregular heartbeat -infection (especially a virus infection such as chickenpox, cold sores, or herpes) -liver disease -previous or ongoing radiation therapy -an unusual or allergic reaction to paclitaxel, alcohol, polyoxyethylated castor oil, other chemotherapy agents, other medicines, foods, dyes, or preservatives -pregnant or trying to  get pregnant -breast-feeding How should I use this medicine? This drug is given as an infusion into a vein. It is administered in a hospital or clinic by a specially trained health care professional. Talk to your pediatrician regarding the use of this medicine in children. Special care may be needed. Overdosage: If you think you have taken too much of this medicine contact a poison control center or emergency room at once. NOTE: This medicine is only for you. Do not share this medicine with others. What if I miss a dose? It is important not to miss your dose. Call your doctor or health care professional if you are unable to keep an appointment. What may interact with this medicine? Do not take this medicine with any of the following medications: -disulfiram -metronidazole This medicine may also interact with the following medications: -cyclosporine -diazepam -ketoconazole -medicines to increase blood counts like filgrastim, pegfilgrastim, sargramostim -other chemotherapy drugs like cisplatin, doxorubicin, epirubicin, etoposide, teniposide, vincristine -quinidine -testosterone -vaccines -verapamil Talk to your doctor or health care professional before taking any of these medicines: -acetaminophen -aspirin -ibuprofen -ketoprofen -naproxen This list may not describe all possible interactions. Give your health care provider a list of all the medicines, herbs, non-prescription drugs, or   dietary supplements you use. Also tell them if you smoke, drink alcohol, or use illegal drugs. Some items may interact with your medicine. What should I watch for while using this medicine? Your condition will be monitored carefully while you are receiving this medicine. You will need important blood work done while you are taking this medicine. This medicine can cause serious allergic reactions. To reduce your risk you will need to take other medicine(s) before treatment with this medicine. If you  experience allergic reactions like skin rash, itching or hives, swelling of the face, lips, or tongue, tell your doctor or health care professional right away. In some cases, you may be given additional medicines to help with side effects. Follow all directions for their use. This drug may make you feel generally unwell. This is not uncommon, as chemotherapy can affect healthy cells as well as cancer cells. Report any side effects. Continue your course of treatment even though you feel ill unless your doctor tells you to stop. Call your doctor or health care professional for advice if you get a fever, chills or sore throat, or other symptoms of a cold or flu. Do not treat yourself. This drug decreases your body's ability to fight infections. Try to avoid being around people who are sick. This medicine may increase your risk to bruise or bleed. Call your doctor or health care professional if you notice any unusual bleeding. Be careful brushing and flossing your teeth or using a toothpick because you may get an infection or bleed more easily. If you have any dental work done, tell your dentist you are receiving this medicine. Avoid taking products that contain aspirin, acetaminophen, ibuprofen, naproxen, or ketoprofen unless instructed by your doctor. These medicines may hide a fever. Do not become pregnant while taking this medicine. Women should inform their doctor if they wish to become pregnant or think they might be pregnant. There is a potential for serious side effects to an unborn child. Talk to your health care professional or pharmacist for more information. Do not breast-feed an infant while taking this medicine. Men are advised not to father a child while receiving this medicine. This product may contain alcohol. Ask your pharmacist or healthcare provider if this medicine contains alcohol. Be sure to tell all healthcare providers you are taking this medicine. Certain medicines, like metronidazole  and disulfiram, can cause an unpleasant reaction when taken with alcohol. The reaction includes flushing, headache, nausea, vomiting, sweating, and increased thirst. The reaction can last from 30 minutes to several hours. What side effects may I notice from receiving this medicine? Side effects that you should report to your doctor or health care professional as soon as possible: -allergic reactions like skin rash, itching or hives, swelling of the face, lips, or tongue -low blood counts - This drug may decrease the number of white blood cells, red blood cells and platelets. You may be at increased risk for infections and bleeding. -signs of infection - fever or chills, cough, sore throat, pain or difficulty passing urine -signs of decreased platelets or bleeding - bruising, pinpoint red spots on the skin, black, tarry stools, nosebleeds -signs of decreased red blood cells - unusually weak or tired, fainting spells, lightheadedness -breathing problems -chest pain -high or low blood pressure -mouth sores -nausea and vomiting -pain, swelling, redness or irritation at the injection site -pain, tingling, numbness in the hands or feet -slow or irregular heartbeat -swelling of the ankle, feet, hands Side effects that usually do not   require medical attention (report to your doctor or health care professional if they continue or are bothersome): -bone pain -complete hair loss including hair on your head, underarms, pubic hair, eyebrows, and eyelashes -changes in the color of fingernails -diarrhea -loosening of the fingernails -loss of appetite -muscle or joint pain -red flush to skin -sweating This list may not describe all possible side effects. Call your doctor for medical advice about side effects. You may report side effects to FDA at 1-800-FDA-1088. Where should I keep my medicine? This drug is given in a hospital or clinic and will not be stored at home. NOTE: This sheet is a summary. It  may not cover all possible information. If you have questions about this medicine, talk to your doctor, pharmacist, or health care provider.  2018 [employer] Standard (2015-08-06 19:58:00)  

## 2017-02-17 ENCOUNTER — Inpatient Hospital Stay: Payer: PPO

## 2017-02-17 ENCOUNTER — Other Ambulatory Visit: Payer: Self-pay

## 2017-02-17 ENCOUNTER — Other Ambulatory Visit: Payer: PPO

## 2017-02-17 ENCOUNTER — Ambulatory Visit: Payer: PPO | Admitting: Oncology

## 2017-02-17 VITALS — BP 129/74 | HR 90 | Temp 98.2°F | Resp 18

## 2017-02-17 DIAGNOSIS — C50412 Malignant neoplasm of upper-outer quadrant of left female breast: Secondary | ICD-10-CM

## 2017-02-17 DIAGNOSIS — Z5111 Encounter for antineoplastic chemotherapy: Secondary | ICD-10-CM | POA: Diagnosis not present

## 2017-02-17 DIAGNOSIS — Z171 Estrogen receptor negative status [ER-]: Principal | ICD-10-CM

## 2017-02-17 MED ORDER — SODIUM CHLORIDE 0.9 % IV SOLN
600.0000 mg/m2 | Freq: Once | INTRAVENOUS | Status: AC
  Administered 2017-02-17: 1160 mg via INTRAVENOUS
  Filled 2017-02-17: qty 50

## 2017-02-17 MED ORDER — SODIUM CHLORIDE 0.9 % IV SOLN
Freq: Once | INTRAVENOUS | Status: AC
  Administered 2017-02-17: 10:00:00 via INTRAVENOUS
  Filled 2017-02-17: qty 1000

## 2017-02-17 MED ORDER — DOXORUBICIN HCL CHEMO IV INJECTION 2 MG/ML
60.0000 mg/m2 | Freq: Once | INTRAVENOUS | Status: AC
  Administered 2017-02-17: 116 mg via INTRAVENOUS
  Filled 2017-02-17: qty 58

## 2017-02-17 MED ORDER — SODIUM CHLORIDE 0.9% FLUSH
10.0000 mL | INTRAVENOUS | Status: DC | PRN
  Administered 2017-02-17: 10 mL
  Filled 2017-02-17: qty 10

## 2017-02-17 MED ORDER — HEPARIN SOD (PORK) LOCK FLUSH 100 UNIT/ML IV SOLN
500.0000 [IU] | Freq: Once | INTRAVENOUS | Status: AC | PRN
  Administered 2017-02-17: 500 [IU]
  Filled 2017-02-17: qty 5

## 2017-02-17 MED ORDER — ALPRAZOLAM 0.25 MG PO TABS: 0.2500 mg | ORAL_TABLET | Freq: Two times a day (BID) | ORAL | 0 refills | Status: DC | PRN

## 2017-02-17 MED ORDER — PALONOSETRON HCL INJECTION 0.25 MG/5ML
0.2500 mg | Freq: Once | INTRAVENOUS | Status: AC
  Administered 2017-02-17: 0.25 mg via INTRAVENOUS
  Filled 2017-02-17: qty 5

## 2017-02-17 MED ORDER — FOSAPREPITANT DIMEGLUMINE INJECTION 150 MG
Freq: Once | INTRAVENOUS | Status: AC
  Administered 2017-02-17: 10:00:00 via INTRAVENOUS
  Filled 2017-02-17: qty 5

## 2017-02-17 MED ORDER — PEGFILGRASTIM 6 MG/0.6ML ~~LOC~~ PSKT
6.0000 mg | PREFILLED_SYRINGE | Freq: Once | SUBCUTANEOUS | Status: AC
  Administered 2017-02-17: 6 mg via SUBCUTANEOUS
  Filled 2017-02-17: qty 0.6

## 2017-02-17 NOTE — Progress Notes (Signed)
  Oncology Nurse Navigator Documentation  Navigator Location: [facility] (02/17/17 1200)   )Navigator Encounter Type: Clinic/[facility] (02/17/17 1200) Telephone: Clinic/[facility] Follow-up (02/17/17 1200)                   Patient Visit Type: Initial (02/17/17 1200) Treatment Phase: First Chemo Tx;Active Tx (02/17/17 1200)   Education: Pain/ Symptom Management (02/17/17 1200)                        Time Spent with Patient: 45 (02/17/17 1200)  Met patient , family at initial chemotherapy.  Patient requested Xanax to decrease anxiety related to arthritis pain.  Discussed with Dr. Grayland Ormond. To be called in to pharmacy.

## 2017-02-24 ENCOUNTER — Inpatient Hospital Stay (HOSPITAL_BASED_OUTPATIENT_CLINIC_OR_DEPARTMENT_OTHER): Payer: PPO | Admitting: Oncology

## 2017-02-24 ENCOUNTER — Inpatient Hospital Stay: Payer: PPO

## 2017-02-24 VITALS — BP 102/70 | HR 154 | Temp 98.2°F | Resp 22 | Wt 176.6 lb

## 2017-02-24 DIAGNOSIS — Z171 Estrogen receptor negative status [ER-]: Principal | ICD-10-CM

## 2017-02-24 DIAGNOSIS — Z7982 Long term (current) use of aspirin: Secondary | ICD-10-CM

## 2017-02-24 DIAGNOSIS — K219 Gastro-esophageal reflux disease without esophagitis: Secondary | ICD-10-CM

## 2017-02-24 DIAGNOSIS — R531 Weakness: Secondary | ICD-10-CM

## 2017-02-24 DIAGNOSIS — R630 Anorexia: Secondary | ICD-10-CM | POA: Diagnosis not present

## 2017-02-24 DIAGNOSIS — D701 Agranulocytosis secondary to cancer chemotherapy: Secondary | ICD-10-CM | POA: Diagnosis not present

## 2017-02-24 DIAGNOSIS — M797 Fibromyalgia: Secondary | ICD-10-CM | POA: Diagnosis not present

## 2017-02-24 DIAGNOSIS — I700 Atherosclerosis of aorta: Secondary | ICD-10-CM

## 2017-02-24 DIAGNOSIS — E039 Hypothyroidism, unspecified: Secondary | ICD-10-CM

## 2017-02-24 DIAGNOSIS — T451X5S Adverse effect of antineoplastic and immunosuppressive drugs, sequela: Secondary | ICD-10-CM

## 2017-02-24 DIAGNOSIS — C50412 Malignant neoplasm of upper-outer quadrant of left female breast: Secondary | ICD-10-CM | POA: Diagnosis not present

## 2017-02-24 DIAGNOSIS — Z808 Family history of malignant neoplasm of other organs or systems: Secondary | ICD-10-CM

## 2017-02-24 DIAGNOSIS — R197 Diarrhea, unspecified: Secondary | ICD-10-CM | POA: Diagnosis not present

## 2017-02-24 DIAGNOSIS — Z79899 Other long term (current) drug therapy: Secondary | ICD-10-CM

## 2017-02-24 DIAGNOSIS — Z170 Estrogen receptor positive status [ER+]: Secondary | ICD-10-CM | POA: Diagnosis not present

## 2017-02-24 DIAGNOSIS — Z5111 Encounter for antineoplastic chemotherapy: Secondary | ICD-10-CM | POA: Diagnosis not present

## 2017-02-24 DIAGNOSIS — R5381 Other malaise: Secondary | ICD-10-CM | POA: Diagnosis not present

## 2017-02-24 DIAGNOSIS — R5383 Other fatigue: Secondary | ICD-10-CM | POA: Diagnosis not present

## 2017-02-24 DIAGNOSIS — Z87442 Personal history of urinary calculi: Secondary | ICD-10-CM

## 2017-02-24 DIAGNOSIS — M810 Age-related osteoporosis without current pathological fracture: Secondary | ICD-10-CM

## 2017-02-24 DIAGNOSIS — Z87891 Personal history of nicotine dependence: Secondary | ICD-10-CM

## 2017-02-24 DIAGNOSIS — M069 Rheumatoid arthritis, unspecified: Secondary | ICD-10-CM | POA: Diagnosis not present

## 2017-02-24 DIAGNOSIS — R110 Nausea: Secondary | ICD-10-CM

## 2017-02-24 DIAGNOSIS — Z803 Family history of malignant neoplasm of breast: Secondary | ICD-10-CM

## 2017-02-24 LAB — CBC WITH DIFFERENTIAL/PLATELET
Basophils Absolute: 0 10*3/uL (ref 0–0.1)
Basophils Relative: 1 %
EOS ABS: 0 10*3/uL (ref 0–0.7)
EOS PCT: 6 %
HCT: 36 % (ref 35.0–47.0)
HEMOGLOBIN: 12.1 g/dL (ref 12.0–16.0)
LYMPHS ABS: 0.4 10*3/uL — AB (ref 1.0–3.6)
Lymphocytes Relative: 69 %
MCH: 29.6 pg (ref 26.0–34.0)
MCHC: 33.7 g/dL (ref 32.0–36.0)
MCV: 87.8 fL (ref 80.0–100.0)
MONOS PCT: 12 %
Monocytes Absolute: 0.1 10*3/uL — ABNORMAL LOW (ref 0.2–0.9)
Neutro Abs: 0.1 10*3/uL — ABNORMAL LOW (ref 1.4–6.5)
Neutrophils Relative %: 12 %
Platelets: 51 10*3/uL — ABNORMAL LOW (ref 150–440)
RBC: 4.1 MIL/uL (ref 3.80–5.20)
RDW: 13.5 % (ref 11.5–14.5)
WBC: 0.5 10*3/uL — CL (ref 3.6–11.0)

## 2017-02-24 LAB — COMPREHENSIVE METABOLIC PANEL
ALK PHOS: 85 U/L (ref 38–126)
ALT: 15 U/L (ref 14–54)
ANION GAP: 7 (ref 5–15)
AST: 42 U/L — ABNORMAL HIGH (ref 15–41)
Albumin: 3.2 g/dL — ABNORMAL LOW (ref 3.5–5.0)
BUN: 17 mg/dL (ref 6–20)
CO2: 22 mmol/L (ref 22–32)
Calcium: 8.4 mg/dL — ABNORMAL LOW (ref 8.9–10.3)
Chloride: 101 mmol/L (ref 101–111)
Creatinine, Ser: 0.88 mg/dL (ref 0.44–1.00)
Glucose, Bld: 160 mg/dL — ABNORMAL HIGH (ref 65–99)
Potassium: 4 mmol/L (ref 3.5–5.1)
SODIUM: 130 mmol/L — AB (ref 135–145)
Total Bilirubin: 0.6 mg/dL (ref 0.3–1.2)
Total Protein: 6.6 g/dL (ref 6.5–8.1)

## 2017-02-24 NOTE — Progress Notes (Signed)
[place]  [phone] [phone]  ID: Samantha Valentine OB: 1946-03-05  MR#: [id number]  OAC#:[id number]  Patient Care Team: Abner Greenspan, MD as PCP - [facility], Wynelle Fanny, MD as Consulting Physician (Family Medicine) Bary Castilla, Forest Gleason, MD (General Surgery)  CHIEF COMPLAINT: Clinical stage IB ER/PR positive, HER-2 negative invasive carcinoma of the upper-outer quadrant of the left breast.  INTERVAL HISTORY: Patient returns to clinic today for further evaluation and to assess her toleration of cycle 1 of Adriamycin and Cytoxan. She had 4-5 days of increased nausea, diarrhea, and poor appetite. She also has increased weakness and fatigue. She does not feel back to her baseline, but states she is improving. Joint pain from her rheumatoid arthritis has resolved. She has no neurologic complaints. She denies any recent fevers or illnesses. She denies any chest pain or shortness of breath. She has no urinary complaints. Patient offers no further specific complaints today.  REVIEW OF SYSTEMS:   Review of Systems  Constitutional: Positive for malaise/fatigue and weight loss. Negative for fever.  Respiratory: Negative.  Negative for cough and shortness of breath.   Cardiovascular: Negative.  Negative for chest pain and leg swelling.  Gastrointestinal: Positive for diarrhea and nausea. Negative for abdominal pain.  Genitourinary: Negative.   Musculoskeletal: Negative.  Negative for joint pain.  Skin: Negative.  Negative for rash.  Neurological: Negative.  Negative for sensory change and weakness.  Psychiatric/Behavioral: Negative.  The patient is not nervous/anxious.     As per HPI. Otherwise, a complete review of systems is negative.  PAST MEDICAL HISTORY: Past Medical History:  Diagnosis Date  . Arthritis    RA  . Breast mass 1 year   . Cancer ([place]) 01/29/2017   left breast INVASIVE MAMMARY CARCINOMA, ER/PR positive  . Collagen vascular disease  (HCC)    Rhematoid Arthritis  . DDD (degenerative disc disease)    in neck  . Depression   . Fibromyalgia   . GERD (gastroesophageal reflux disease)    NO MEDS  . History of kidney stones    MULTIPLE KIDNEY STONES BIL  . Hypothyroidism   . Migraine   . Osteoporosis     PAST SURGICAL HISTORY: Past Surgical History:  Procedure Laterality Date  . BREAST BIOPSY Left 01/29/2017   US guided biopsy INVASIVE MAMMARY CARCINOMA  . JOINT REPLACEMENT  2003   total hip replacement  . LITHOTRIPSY  1997   kidney stone  . PORTACATH PLACEMENT Right 02/15/2017   Procedure: INSERTION PORT-A-CATH;  Surgeon: Robert Bellow, MD;  Location: [facility] ORS;  Service: General;  Laterality: Right;  . TONSILLECTOMY  1970    FAMILY HISTORY: Family History  Problem Relation Age of Onset  . Alcohol abuse Father   . Diabetes Father   . Stroke Father   . Hypertension Mother   . Endometrial cancer Mother   . Osteoarthritis Mother   . Breast cancer Paternal Aunt 46  . Liver disease Sister   . Breast cancer Maternal Aunt   . Rheum arthritis Maternal Grandmother   . Diabetes Paternal Grandmother   . Parkinson's disease Paternal Grandfather     ADVANCED DIRECTIVES (Y/N):  N  HEALTH MAINTENANCE: Social History  Substance Use Topics  . Smoking status: Former Smoker    Packs/day: 1.50    Years: 25.00    Types: Cigarettes    Quit date: 10/20/1983  . Smokeless tobacco: Never Used  . Alcohol use No     Colonoscopy:  PAP:  Bone density:  Lipid panel:  Allergies  Allergen Reactions  . Hydroxychloroquine Sulfate     REACTION: rash  . Ibandronate Sodium     REACTION: rash  . Risedronate Sodium     REACTION: rash    Current Outpatient Prescriptions  Medication Sig Dispense Refill  . ALPRAZolam (XANAX) 0.25 MG tablet Take 1 tablet (0.25 mg total) by mouth 2 (two) times daily as needed for anxiety. 60 tablet 0  . CELEBREX 200 MG capsule Take 200 mg by mouth 2 (two) times daily.     Marland Kitchen  levothyroxine (SYNTHROID, LEVOTHROID) 137 MCG tablet Take 1 tablet (137 mcg total) by mouth daily before breakfast. 30 tablet 11  . lidocaine-prilocaine (EMLA) cream Apply to affected area once 30 g 3  . ondansetron (ZOFRAN) 8 MG tablet Take 1 tablet (8 mg total) by mouth 2 (two) times daily as needed. 60 tablet 2  . prochlorperazine (COMPAZINE) 10 MG tablet Take 1 tablet (10 mg total) by mouth every 6 (six) hours as needed (Nausea or vomiting). 60 tablet 2  . traMADol (ULTRAM) 50 MG tablet Take 1 tablet (50 mg total) by mouth every 4 (four) hours as needed. 15 tablet 0  . aspirin EC 81 MG tablet Take 81 mg by mouth daily.    . Cholecalciferol 1000 units TBDP Take 1,000 Units by mouth daily.    . predniSONE (DELTASONE) 5 MG tablet Take 4 tab QD PO x 4 days, then 3 tab QD x 4 days, then 2 tab QD x 4 days, then 1 tab QD x 4 days, then stop (Patient not taking: Reported on 02/24/2017) 40 tablet 0   No current facility-administered medications for this visit.     OBJECTIVE: Vitals:   02/24/17 1420 02/24/17 1429  BP: 104/72 102/70  Pulse: (!) 147 (!) 154  Resp: 20 (!) 22  Temp: 98.2 F (36.8 C)      Body mass index is 30.31 kg/m.    ECOG FS:0 - Asymptomatic  General: Well-developed, well-nourished, no acute distress. Eyes: Pink conjunctiva, anicteric sclera. Breasts: Easily palpable left breast mass. Lungs: Clear to auscultation bilaterally. Heart: Regular rate and rhythm. No rubs, murmurs, or gallops. Abdomen: Soft, nontender, nondistended. No organomegaly noted, normoactive bowel sounds. Musculoskeletal: No edema, cyanosis, or clubbing. Neuro: Alert, answering all questions appropriately. Cranial nerves grossly intact. Skin: No rashes or petechiae noted. Psych: Normal affect.   LAB RESULTS:  Lab Results  Component Value Date   NA 130 (L) 02/24/2017   K 4.0 02/24/2017   CL 101 02/24/2017   CO2 22 02/24/2017   GLUCOSE 160 (H) 02/24/2017   BUN 17 02/24/2017   CREATININE 0.88  02/24/2017   CALCIUM 8.4 (L) 02/24/2017   PROT 6.6 02/24/2017   ALBUMIN 3.2 (L) 02/24/2017   AST 42 (H) 02/24/2017   ALT 15 02/24/2017   ALKPHOS 85 02/24/2017   BILITOT 0.6 02/24/2017   GFRNONAA >60 02/24/2017   GFRAA >60 02/24/2017    Lab Results  Component Value Date   WBC 0.5 (LL) 02/24/2017   NEUTROABS 0.1 (L) 02/24/2017   HGB 12.1 02/24/2017   HCT 36.0 02/24/2017   MCV 87.8 02/24/2017   PLT 51 (L) 02/24/2017     STUDIES: Ct Chest W Contrast  Result Date: 02/12/2017 CLINICAL DATA:  Newly diagnosed left breast cancer. Biopsy 01/29/2017. Left flank pain for 3 weeks. Prior lithotripsy for kidney stones and right total hip arthroplasty. EXAM: CT CHEST, ABDOMEN, AND PELVIS WITH CONTRAST TECHNIQUE: Multidetector  CT imaging of the chest, abdomen and pelvis was performed following the standard protocol during bolus administration of intravenous contrast. CONTRAST:  162m ISOVUE-300 IOPAMIDOL (ISOVUE-300) INJECTION 61% COMPARISON:  01/10/2016 abdominopelvic CT. 10/25/2013 chest radiograph. FINDINGS: CT CHEST FINDINGS Cardiovascular: Mild dilatation of the ascending aorta at 4.2 cm on image 28/series 2. Normal caliber transverse and descending segments. Tortuous thoracic aorta. Mild cardiomegaly, without pericardial effusion. Left atrial enlargement. No central pulmonary embolism, on this non-dedicated study. Mediastinum/Nodes: No supraclavicular adenopathy. No mediastinal or hilar adenopathy. Tiny hiatal hernia. Lungs/Pleura: No pleural fluid. Mild centrilobular emphysema. Mild biapical pleural-parenchymal scarring. Musculoskeletal: No axillary adenopathy. Left breast primary measures 2.6 x 3.2 cm and is in the central left breast on image 24/series 2. Remote lower left rib fractures are likely new since the prior exam. Example ribs 6 through 8. T9-10 calcified disc extrusion is eccentric left and causes significant canal narrowing, including on sagittal image 105. CT ABDOMEN PELVIS FINDINGS  Hepatobiliary: Hypoattenuation in the central hepatic dome measures 9 mm on image 49/series 2 and may represent a non-opacified (secondary to bolus timing) hepatic vein branch. Normal gallbladder, without biliary ductal dilatation. Pancreas: Normal, without mass or ductal dilatation. Spleen: Normal in size, without focal abnormality. Adrenals/Urinary Tract: Normal right adrenal gland. Minimal left adrenal nodularity is felt to be similar an 8 mm. A stone within the right ureteropelvic junction measures 8 mm on image 72/series 2 and results in an mild caliectasis. Degraded evaluation of the pelvis, secondary to beam hardening artifact from right hip arthroplasty. Grossly normal bladder. Stomach/Bowel: Normal remainder of the stomach. Colonic stool burden suggests constipation. The cecum is redundant with the ileocecal junction positioned in the midline, including on image 80/series 2. Normal small bowel. Vascular/Lymphatic: Aortic and branch vessel atherosclerosis. No abdominopelvic adenopathy. Reproductive: Normal uterus and adnexa. Other: No significant free fluid. Mild pelvic floor laxity. No evidence of omental or peritoneal disease. Musculoskeletal: Right hip arthroplasty. Prominent disc bulges at L3-4 and L2-3. IMPRESSION: 1. Left breast primary, without evidence of metastatic disease in the chest. 2. Subtle hypoattenuation in the central liver. Although this could represent relatively under opacified hepatic vein branch, a new lesion cannot be excluded. Consider further evaluation with pre and post contrast abdominal MRI. 3. Partially obstructive stone at the right ureteropelvic junction. 4. Degraded evaluation of the pelvis, secondary to beam hardening artifact from right hip arthroplasty. 5. Calcified T9-10 disc causing significant central and left paracentral canal narrowing. Correlate with symptoms and possibly thoracic spine MRI. 6. Anterior lower left rib fractures, nonacute but felt to be new since  01/10/2016. 7.  Aortic atherosclerosis. 8.  Possible constipation. Electronically Signed   By: KAbigail MiyamotoM.D.   On: 02/12/2017 17:31   Nm Cardiac Muga Rest  Result Date: 02/12/2017 CLINICAL DATA:  LEFT breast cancer, pre cardiotoxic chemotherapy EXAM: NUCLEAR MEDICINE CARDIAC BLOOD POOL IMAGING (MUGA) TECHNIQUE: Cardiac multi-gated acquisition was performed at rest following intravenous injection of Tc-967mabeled red blood cells. RADIOPHARMACEUTICALS:  22.75 mCi Tc-9975mrtechnetate in-vitro labeled autologous red blood cells IV COMPARISON:  None FINDINGS: Calculated LEFT ventricular ejection fraction of 75%, at the upper end of the normal range. Study was obtained at a heart rate of 75 beats per minute. Patient was rhythmic during imaging. Normal LEFT ventricular wall motion identified on cine analysis of the gated blood pool in 3 projections. IMPRESSION: Upper normal LEFT ventricular ejection fraction of 75%. Normal LV wall motion. Electronically Signed   By: MarLavonia DanaD.   On: 02/12/2017  13:46   Ct Abdomen Pelvis W Contrast  Result Date: 02/12/2017 CLINICAL DATA:  Newly diagnosed left breast cancer. Biopsy 01/29/2017. Left flank pain for 3 weeks. Prior lithotripsy for kidney stones and right total hip arthroplasty. EXAM: CT CHEST, ABDOMEN, AND PELVIS WITH CONTRAST TECHNIQUE: Multidetector CT imaging of the chest, abdomen and pelvis was performed following the standard protocol during bolus administration of intravenous contrast. CONTRAST:  168m ISOVUE-300 IOPAMIDOL (ISOVUE-300) INJECTION 61% COMPARISON:  01/10/2016 abdominopelvic CT. 10/25/2013 chest radiograph. FINDINGS: CT CHEST FINDINGS Cardiovascular: Mild dilatation of the ascending aorta at 4.2 cm on image 28/series 2. Normal caliber transverse and descending segments. Tortuous thoracic aorta. Mild cardiomegaly, without pericardial effusion. Left atrial enlargement. No central pulmonary embolism, on this non-dedicated study.  Mediastinum/Nodes: No supraclavicular adenopathy. No mediastinal or hilar adenopathy. Tiny hiatal hernia. Lungs/Pleura: No pleural fluid. Mild centrilobular emphysema. Mild biapical pleural-parenchymal scarring. Musculoskeletal: No axillary adenopathy. Left breast primary measures 2.6 x 3.2 cm and is in the central left breast on image 24/series 2. Remote lower left rib fractures are likely new since the prior exam. Example ribs 6 through 8. T9-10 calcified disc extrusion is eccentric left and causes significant canal narrowing, including on sagittal image 105. CT ABDOMEN PELVIS FINDINGS Hepatobiliary: Hypoattenuation in the central hepatic dome measures 9 mm on image 49/series 2 and may represent a non-opacified (secondary to bolus timing) hepatic vein branch. Normal gallbladder, without biliary ductal dilatation. Pancreas: Normal, without mass or ductal dilatation. Spleen: Normal in size, without focal abnormality. Adrenals/Urinary Tract: Normal right adrenal gland. Minimal left adrenal nodularity is felt to be similar an 8 mm. A stone within the right ureteropelvic junction measures 8 mm on image 72/series 2 and results in an mild caliectasis. Degraded evaluation of the pelvis, secondary to beam hardening artifact from right hip arthroplasty. Grossly normal bladder. Stomach/Bowel: Normal remainder of the stomach. Colonic stool burden suggests constipation. The cecum is redundant with the ileocecal junction positioned in the midline, including on image 80/series 2. Normal small bowel. Vascular/Lymphatic: Aortic and branch vessel atherosclerosis. No abdominopelvic adenopathy. Reproductive: Normal uterus and adnexa. Other: No significant free fluid. Mild pelvic floor laxity. No evidence of omental or peritoneal disease. Musculoskeletal: Right hip arthroplasty. Prominent disc bulges at L3-4 and L2-3. IMPRESSION: 1. Left breast primary, without evidence of metastatic disease in the chest. 2. Subtle hypoattenuation in  the central liver. Although this could represent relatively under opacified hepatic vein branch, a new lesion cannot be excluded. Consider further evaluation with pre and post contrast abdominal MRI. 3. Partially obstructive stone at the right ureteropelvic junction. 4. Degraded evaluation of the pelvis, secondary to beam hardening artifact from right hip arthroplasty. 5. Calcified T9-10 disc causing significant central and left paracentral canal narrowing. Correlate with symptoms and possibly thoracic spine MRI. 6. Anterior lower left rib fractures, nonacute but felt to be new since 01/10/2016. 7.  Aortic atherosclerosis. 8.  Possible constipation. Electronically Signed   By: KAbigail MiyamotoM.D.   On: 02/12/2017 17:31   Dg Chest Port 1 View  Result Date: 02/15/2017 CLINICAL DATA:  Porta catheter placement.  Breast cancer. EXAM: PORTABLE CHEST 1 VIEW COMPARISON:  Chest CT 3 days ago FINDINGS: Subclavian porta catheter on the right with tip at the SVC level. No visible pneumothorax or mediastinal widening. Normal heart size.  No pleural effusion or pulmonary edema. IMPRESSION: New porta catheter with tip at the SVC level.  No acute finding. Electronically Signed   By: JMonte FantasiaM.D.   On: 02/15/2017  10:38   Dg C-arm 1-60 Min-no Report  Result Date: 02/15/2017 Fluoroscopy was utilized by the requesting physician.  No radiographic interpretation.   Mm Clip Placement Left  Result Date: 01/29/2017 CLINICAL DATA:  Post biopsy mammogram of the left breast for clip placement. EXAM: DIAGNOSTIC LEFT MAMMOGRAM POST ULTRASOUND BIOPSY COMPARISON:  Previous exam(s). FINDINGS: Mammographic images were obtained following ultrasound guided biopsy of left breast mass at 2 o'clock. The wing shaped biopsy marking clip is appropriately positioned within the biopsied mass in the upper-outer left breast. IMPRESSION: Appropriate positioning of the wing shaped biopsy marking clip in the mass in the upper-outer left breast.  Final Assessment: Post Procedure Mammograms for Marker Placement Electronically Signed   By: Ammie Ferrier M.D.   On: 01/29/2017 09:17   [place] Lt Breast Bx W Loc Dev 1st Lesion Img Bx Spec US Guide  Addendum Date: 02/05/2017   ADDENDUM REPORT: 02/05/2017 10:28 ADDENDUM: PATHOLOGY: Grade 2 invasive mammary carcinoma, no special type. CONCORDANT: Yes I telephoned the patient on 02/01/2017 at 3:25 p.m. and discussed these results and the recommendations stated below. All questions were answered. The patient denies significant pain or bleeding from the biopsy site. Biopsy site care instructions were reviewed and the patient was asked to call [facility] with any questions or issues related to the biopsy. RECOMMENDATION: Surgical excision and multidisciplinary consultation recommended. The patient has rheumatoid arthritis and is concerned about the combination of treatment of breast cancer and her rheumatoid disease, and is inclined to not undergo treatment for her breast cancer because of this. I advised her to at least have consultations with potential treating physicians to determine her options before making any final decisions. The patient had a surgical consultation with Dr. Bary Castilla on 02/04/2017. Electronically Signed   By: Ammie Ferrier M.D.   On: 02/05/2017 10:28   Result Date: 02/05/2017 CLINICAL DATA:  71 year old female presenting for ultrasound-guided biopsy of a left breast mass. EXAM: ULTRASOUND GUIDED LEFT BREAST CORE NEEDLE BIOPSY COMPARISON:  Previous exam(s). FINDINGS: I met with the patient and we discussed the procedure of ultrasound-guided biopsy, including benefits and alternatives. We discussed the high likelihood of a successful procedure. We discussed the risks of the procedure, including infection, bleeding, tissue injury, clip migration, and inadequate sampling. Informed written consent was given. The usual time-out protocol was performed immediately prior to the  procedure. Lesion quadrant: Upper-outer Using sterile technique and 1% Lidocaine as local anesthetic, under direct ultrasound visualization, a 14 gauge spring-loaded device was used to perform biopsy of mass in the left breast at 2 o'clock using an inferolateral approach. At the conclusion of the procedure a wing shaped tissue marker clip was deployed into the biopsy cavity. Follow up 2 view mammogram was performed and dictated separately. IMPRESSION: Ultrasound guided biopsy of left breast mass at 2 o'clock. No apparent complications. Electronically Signed: By: Ammie Ferrier M.D. On: 01/29/2017 09:21    ASSESSMENT: Clinical stage IB ER/PR positive, HER-2 negative invasive carcinoma of the upper-outer quadrant of the left breast  PLAN:    1. Clinical stage IB ER/PR positive, HER-2 negative invasive carcinoma of the upper-outer quadrant of the left breast: Given the size of patient's tumor, have recommended neoadjuvant chemotherapy. Patient will receive dose dense Adriamycin and Cytoxan followed by 12 weekly cycles of Taxol. Once patient completes chemotherapy, she will require lumpectomy or mastectomy. She will then require adjuvant XRT depending on the type of surgery she chooses. Finally, patient will require an aromatase inhibitor for 5  years given the ER/PR positivity of her disease. Pretreatment MUGA on February 12, 2017 is adequate to proceed with an EF of 75%. CT of the chest, abdomen, pelvis did not reveal any evidence of metastatic disease. Patient tolerated cycle 1 of 4 of Adriamycin and Cytoxan with Neulasta support. Return to clinic in 1 week for consideration of cycle 2. 2. Rheumatoid arthritis: Patient has discontinued Enbrel since the diagnosis of her malignancy. Since this is a solid tumor, okay to restart from an oncology standpoint once chemotherapy has been completed. Since initiating chemotherapy her symptoms have significantly improved. 3. Neutropenia: Secondary chemotherapy, Neulasta  as above. 4. Pancytopenia: Secondary to chemotherapy. Monitor. 5. Nausea: Continue Zofran and Compazine as prescribed. 6. Diarrhea: Continue OTC Imodium as needed.  Approximately 30 minutes was spent in discussion of which greater than 50% was consultation.  Patient expressed understanding and was in agreement with this plan. She also understands that She can call clinic at any time with any questions, concerns, or complaints.   Cancer Staging Malignant neoplasm of upper-outer quadrant of left breast in female, estrogen receptor negative ([place]) Staging form: Breast, AJCC 8th Edition - Clinical stage from 02/04/2017: Stage IB (cT2, cN0, cM0, G2, ER: Positive, PR: Positive, HER2: Negative) - Signed by Lloyd Huger, MD on 02/15/2017   Lloyd Huger, MD   02/27/2017 7:21 AM

## 2017-02-24 NOTE — Progress Notes (Signed)
Patient reports diarrhea x 2 days used immodium yesterday and now it is resolved. Patient reports feeling really tired and worn out, decreased appetite, noted with 6 lb weight loss since last visit, area on right wrist noted with tingling and pain, nausea at intervals controlled with the use of zofran. BP sitting 104/72 manually with HR of 147 and standing 102/70 with hr of 154.

## 2017-03-02 ENCOUNTER — Ambulatory Visit: Payer: PPO | Admitting: Rheumatology

## 2017-03-02 NOTE — Progress Notes (Signed)
[place]  [phone] [phone]  ID: Samantha Valentine OB: 29-Aug-1946  MR#: [id number]  CSN#:[id number]  Patient Care Team: Abner Greenspan, MD as PCP - [facility], Wynelle Fanny, MD as Consulting Physician (Family Medicine) Bary Castilla, Forest Gleason, MD (General Surgery)  CHIEF COMPLAINT: Clinical stage IB ER/PR positive, HER-2 negative invasive carcinoma of the upper-outer quadrant of the left breast.  INTERVAL HISTORY: Patient returns to clinic today for further evaluation and consideration of cycle 2 of Adriamycin and Cytoxan. She had 4-5 days of increased nausea, diarrhea, and poor appetite with her first cycle, but this is since resolved and she feels back to her baseline.  Joint pain from her rheumatoid arthritis is returning. She has no neurologic complaints. She denies any recent fevers or illnesses. She denies any chest pain or shortness of breath. She has no urinary complaints. Patient offers no further specific complaints today.  REVIEW OF SYSTEMS:   Review of Systems  Constitutional: Negative for fever, malaise/fatigue and weight loss.  Respiratory: Negative.  Negative for cough and shortness of breath.   Cardiovascular: Negative.  Negative for chest pain and leg swelling.  Gastrointestinal: Negative for abdominal pain, diarrhea and nausea.  Genitourinary: Negative.   Musculoskeletal: Positive for joint pain.  Skin: Negative.  Negative for rash.  Neurological: Negative.  Negative for sensory change and weakness.  Psychiatric/Behavioral: Negative.  The patient is not nervous/anxious.     As per HPI. Otherwise, a complete review of systems is negative.  PAST MEDICAL HISTORY: Past Medical History:  Diagnosis Date  . Arthritis    RA  . Breast mass 1 year   . Cancer ([place]) 01/29/2017   left breast INVASIVE MAMMARY CARCINOMA, ER/PR positive  . Collagen vascular disease (HCC)    Rhematoid Arthritis  . DDD (degenerative disc disease)    in neck   . Depression   . Fibromyalgia   . GERD (gastroesophageal reflux disease)    NO MEDS  . History of kidney stones    MULTIPLE KIDNEY STONES BIL  . Hypothyroidism   . Migraine   . Osteoporosis     PAST SURGICAL HISTORY: Past Surgical History:  Procedure Laterality Date  . BREAST BIOPSY Left 01/29/2017   US guided biopsy INVASIVE MAMMARY CARCINOMA  . JOINT REPLACEMENT  2003   total hip replacement  . LITHOTRIPSY  1997   kidney stone  . PORTACATH PLACEMENT Right 02/15/2017   Procedure: INSERTION PORT-A-CATH;  Surgeon: Robert Bellow, MD;  Location: [facility] ORS;  Service: General;  Laterality: Right;  . TONSILLECTOMY  1970    FAMILY HISTORY: Family History  Problem Relation Age of Onset  . Alcohol abuse Father   . Diabetes Father   . Stroke Father   . Hypertension Mother   . Endometrial cancer Mother   . Osteoarthritis Mother   . Breast cancer Paternal Aunt 60  . Liver disease Sister   . Breast cancer Maternal Aunt   . Rheum arthritis Maternal Grandmother   . Diabetes Paternal Grandmother   . Parkinson's disease Paternal Grandfather     ADVANCED DIRECTIVES (Y/N):  N  HEALTH MAINTENANCE: Social History  Substance Use Topics  . Smoking status: Former Smoker    Packs/day: 1.50    Years: 25.00    Types: Cigarettes    Quit date: 10/20/1983  . Smokeless tobacco: Never Used  . Alcohol use No     Colonoscopy:  PAP:  Bone density:  Lipid panel:  Allergies  Allergen Reactions  .  Hydroxychloroquine Sulfate     REACTION: rash  . Ibandronate Sodium     REACTION: rash  . Risedronate Sodium     REACTION: rash    Current Outpatient Prescriptions  Medication Sig Dispense Refill  . ALPRAZolam (XANAX) 0.25 MG tablet Take 1 tablet (0.25 mg total) by mouth 2 (two) times daily as needed for anxiety. 60 tablet 0  . aspirin EC 81 MG tablet Take 81 mg by mouth daily.    . CELEBREX 200 MG capsule Take 200 mg by mouth 2 (two) times daily.     . Cholecalciferol 1000 units  TBDP Take 1,000 Units by mouth daily.    Marland Kitchen levothyroxine (SYNTHROID, LEVOTHROID) 137 MCG tablet Take 1 tablet (137 mcg total) by mouth daily before breakfast. 30 tablet 11  . lidocaine-prilocaine (EMLA) cream Apply to affected area once 30 g 3  . ondansetron (ZOFRAN) 8 MG tablet Take 1 tablet (8 mg total) by mouth 2 (two) times daily as needed. 60 tablet 2  . predniSONE (DELTASONE) 5 MG tablet Take 4 tab QD PO x 4 days, then 3 tab QD x 4 days, then 2 tab QD x 4 days, then 1 tab QD x 4 days, then stop 40 tablet 0  . prochlorperazine (COMPAZINE) 10 MG tablet Take 1 tablet (10 mg total) by mouth every 6 (six) hours as needed (Nausea or vomiting). 60 tablet 2  . traMADol (ULTRAM) 50 MG tablet Take 1 tablet (50 mg total) by mouth every 4 (four) hours as needed. 15 tablet 0   No current facility-administered medications for this visit.     OBJECTIVE: Vitals:   03/03/17 1139  BP: 117/79  Pulse: 88  Resp: (!) 98     Body mass index is 30.21 kg/m.    ECOG FS:0 - Asymptomatic  General: Well-developed, well-nourished, no acute distress. Eyes: Pink conjunctiva, anicteric sclera. Breasts: Easily palpable left breast mass. Exam deferred today. Lungs: Clear to auscultation bilaterally. Heart: Regular rate and rhythm. No rubs, murmurs, or gallops. Abdomen: Soft, nontender, nondistended. No organomegaly noted, normoactive bowel sounds. Musculoskeletal: No edema, cyanosis, or clubbing. Neuro: Alert, answering all questions appropriately. Cranial nerves grossly intact. Skin: No rashes or petechiae noted. Psych: Normal affect.   LAB RESULTS:  Lab Results  Component Value Date   NA 136 03/03/2017   K 4.1 03/03/2017   CL 105 03/03/2017   CO2 24 03/03/2017   GLUCOSE 98 03/03/2017   BUN 13 03/03/2017   CREATININE 0.72 03/03/2017   CALCIUM 8.7 (L) 03/03/2017   PROT 6.6 03/03/2017   ALBUMIN 3.2 (L) 03/03/2017   AST 35 03/03/2017   ALT 17 03/03/2017   ALKPHOS 87 03/03/2017   BILITOT 0.3  03/03/2017   GFRNONAA >60 03/03/2017   GFRAA >60 03/03/2017    Lab Results  Component Value Date   WBC 9.6 03/03/2017   NEUTROABS 7.2 (H) 03/03/2017   HGB 12.6 03/03/2017   HCT 37.0 03/03/2017   MCV 86.6 03/03/2017   PLT 378 03/03/2017     STUDIES: Ct Chest W Contrast  Result Date: 02/12/2017 CLINICAL DATA:  Newly diagnosed left breast cancer. Biopsy 01/29/2017. Left flank pain for 3 weeks. Prior lithotripsy for kidney stones and right total hip arthroplasty. EXAM: CT CHEST, ABDOMEN, AND PELVIS WITH CONTRAST TECHNIQUE: Multidetector CT imaging of the chest, abdomen and pelvis was performed following the standard protocol during bolus administration of intravenous contrast. CONTRAST:  173m ISOVUE-300 IOPAMIDOL (ISOVUE-300) INJECTION 61% COMPARISON:  01/10/2016 abdominopelvic CT. 10/25/2013 chest radiograph.  FINDINGS: CT CHEST FINDINGS Cardiovascular: Mild dilatation of the ascending aorta at 4.2 cm on image 28/series 2. Normal caliber transverse and descending segments. Tortuous thoracic aorta. Mild cardiomegaly, without pericardial effusion. Left atrial enlargement. No central pulmonary embolism, on this non-dedicated study. Mediastinum/Nodes: No supraclavicular adenopathy. No mediastinal or hilar adenopathy. Tiny hiatal hernia. Lungs/Pleura: No pleural fluid. Mild centrilobular emphysema. Mild biapical pleural-parenchymal scarring. Musculoskeletal: No axillary adenopathy. Left breast primary measures 2.6 x 3.2 cm and is in the central left breast on image 24/series 2. Remote lower left rib fractures are likely new since the prior exam. Example ribs 6 through 8. T9-10 calcified disc extrusion is eccentric left and causes significant canal narrowing, including on sagittal image 105. CT ABDOMEN PELVIS FINDINGS Hepatobiliary: Hypoattenuation in the central hepatic dome measures 9 mm on image 49/series 2 and may represent a non-opacified (secondary to bolus timing) hepatic vein branch. Normal  gallbladder, without biliary ductal dilatation. Pancreas: Normal, without mass or ductal dilatation. Spleen: Normal in size, without focal abnormality. Adrenals/Urinary Tract: Normal right adrenal gland. Minimal left adrenal nodularity is felt to be similar an 8 mm. A stone within the right ureteropelvic junction measures 8 mm on image 72/series 2 and results in an mild caliectasis. Degraded evaluation of the pelvis, secondary to beam hardening artifact from right hip arthroplasty. Grossly normal bladder. Stomach/Bowel: Normal remainder of the stomach. Colonic stool burden suggests constipation. The cecum is redundant with the ileocecal junction positioned in the midline, including on image 80/series 2. Normal small bowel. Vascular/Lymphatic: Aortic and branch vessel atherosclerosis. No abdominopelvic adenopathy. Reproductive: Normal uterus and adnexa. Other: No significant free fluid. Mild pelvic floor laxity. No evidence of omental or peritoneal disease. Musculoskeletal: Right hip arthroplasty. Prominent disc bulges at L3-4 and L2-3. IMPRESSION: 1. Left breast primary, without evidence of metastatic disease in the chest. 2. Subtle hypoattenuation in the central liver. Although this could represent relatively under opacified hepatic vein branch, a new lesion cannot be excluded. Consider further evaluation with pre and post contrast abdominal MRI. 3. Partially obstructive stone at the right ureteropelvic junction. 4. Degraded evaluation of the pelvis, secondary to beam hardening artifact from right hip arthroplasty. 5. Calcified T9-10 disc causing significant central and left paracentral canal narrowing. Correlate with symptoms and possibly thoracic spine MRI. 6. Anterior lower left rib fractures, nonacute but felt to be new since 01/10/2016. 7.  Aortic atherosclerosis. 8.  Possible constipation. Electronically Signed   By: Abigail Miyamoto M.D.   On: 02/12/2017 17:31   Nm Cardiac Muga Rest  Result Date:  02/12/2017 CLINICAL DATA:  LEFT breast cancer, pre cardiotoxic chemotherapy EXAM: NUCLEAR MEDICINE CARDIAC BLOOD POOL IMAGING (MUGA) TECHNIQUE: Cardiac multi-gated acquisition was performed at rest following intravenous injection of Tc-75mlabeled red blood cells. RADIOPHARMACEUTICALS:  22.75 mCi Tc-970mertechnetate in-vitro labeled autologous red blood cells IV COMPARISON:  None FINDINGS: Calculated LEFT ventricular ejection fraction of 75%, at the upper end of the normal range. Study was obtained at a heart rate of 75 beats per minute. Patient was rhythmic during imaging. Normal LEFT ventricular wall motion identified on cine analysis of the gated blood pool in 3 projections. IMPRESSION: Upper normal LEFT ventricular ejection fraction of 75%. Normal LV wall motion. Electronically Signed   By: MaLavonia Dana.D.   On: 02/12/2017 13:46   Ct Abdomen Pelvis W Contrast  Result Date: 02/12/2017 CLINICAL DATA:  Newly diagnosed left breast cancer. Biopsy 01/29/2017. Left flank pain for 3 weeks. Prior lithotripsy for kidney stones and right total  hip arthroplasty. EXAM: CT CHEST, ABDOMEN, AND PELVIS WITH CONTRAST TECHNIQUE: Multidetector CT imaging of the chest, abdomen and pelvis was performed following the standard protocol during bolus administration of intravenous contrast. CONTRAST:  167m ISOVUE-300 IOPAMIDOL (ISOVUE-300) INJECTION 61% COMPARISON:  01/10/2016 abdominopelvic CT. 10/25/2013 chest radiograph. FINDINGS: CT CHEST FINDINGS Cardiovascular: Mild dilatation of the ascending aorta at 4.2 cm on image 28/series 2. Normal caliber transverse and descending segments. Tortuous thoracic aorta. Mild cardiomegaly, without pericardial effusion. Left atrial enlargement. No central pulmonary embolism, on this non-dedicated study. Mediastinum/Nodes: No supraclavicular adenopathy. No mediastinal or hilar adenopathy. Tiny hiatal hernia. Lungs/Pleura: No pleural fluid. Mild centrilobular emphysema. Mild biapical  pleural-parenchymal scarring. Musculoskeletal: No axillary adenopathy. Left breast primary measures 2.6 x 3.2 cm and is in the central left breast on image 24/series 2. Remote lower left rib fractures are likely new since the prior exam. Example ribs 6 through 8. T9-10 calcified disc extrusion is eccentric left and causes significant canal narrowing, including on sagittal image 105. CT ABDOMEN PELVIS FINDINGS Hepatobiliary: Hypoattenuation in the central hepatic dome measures 9 mm on image 49/series 2 and may represent a non-opacified (secondary to bolus timing) hepatic vein branch. Normal gallbladder, without biliary ductal dilatation. Pancreas: Normal, without mass or ductal dilatation. Spleen: Normal in size, without focal abnormality. Adrenals/Urinary Tract: Normal right adrenal gland. Minimal left adrenal nodularity is felt to be similar an 8 mm. A stone within the right ureteropelvic junction measures 8 mm on image 72/series 2 and results in an mild caliectasis. Degraded evaluation of the pelvis, secondary to beam hardening artifact from right hip arthroplasty. Grossly normal bladder. Stomach/Bowel: Normal remainder of the stomach. Colonic stool burden suggests constipation. The cecum is redundant with the ileocecal junction positioned in the midline, including on image 80/series 2. Normal small bowel. Vascular/Lymphatic: Aortic and branch vessel atherosclerosis. No abdominopelvic adenopathy. Reproductive: Normal uterus and adnexa. Other: No significant free fluid. Mild pelvic floor laxity. No evidence of omental or peritoneal disease. Musculoskeletal: Right hip arthroplasty. Prominent disc bulges at L3-4 and L2-3. IMPRESSION: 1. Left breast primary, without evidence of metastatic disease in the chest. 2. Subtle hypoattenuation in the central liver. Although this could represent relatively under opacified hepatic vein branch, a new lesion cannot be excluded. Consider further evaluation with pre and post  contrast abdominal MRI. 3. Partially obstructive stone at the right ureteropelvic junction. 4. Degraded evaluation of the pelvis, secondary to beam hardening artifact from right hip arthroplasty. 5. Calcified T9-10 disc causing significant central and left paracentral canal narrowing. Correlate with symptoms and possibly thoracic spine MRI. 6. Anterior lower left rib fractures, nonacute but felt to be new since 01/10/2016. 7.  Aortic atherosclerosis. 8.  Possible constipation. Electronically Signed   By: KAbigail MiyamotoM.D.   On: 02/12/2017 17:31   Dg Chest Port 1 View  Result Date: 02/15/2017 CLINICAL DATA:  Porta catheter placement.  Breast cancer. EXAM: PORTABLE CHEST 1 VIEW COMPARISON:  Chest CT 3 days ago FINDINGS: Subclavian porta catheter on the right with tip at the SVC level. No visible pneumothorax or mediastinal widening. Normal heart size.  No pleural effusion or pulmonary edema. IMPRESSION: New porta catheter with tip at the SVC level.  No acute finding. Electronically Signed   By: JMonte FantasiaM.D.   On: 02/15/2017 10:38   Dg C-arm 1-60 Min-no Report  Result Date: 02/15/2017 Fluoroscopy was utilized by the requesting physician.  No radiographic interpretation.    ASSESSMENT: Clinical stage IB ER/PR positive, HER-2 negative invasive carcinoma  of the upper-outer quadrant of the left breast  PLAN:    1. Clinical stage IB ER/PR positive, HER-2 negative invasive carcinoma of the upper-outer quadrant of the left breast: Given the size of patient's tumor, have recommended neoadjuvant chemotherapy. Patient will receive dose dense Adriamycin and Cytoxan followed by 12 weekly cycles of Taxol. Once patient completes chemotherapy, she will require lumpectomy or mastectomy. She will then require adjuvant XRT depending on the type of surgery she chooses. Finally, patient will require an aromatase inhibitor for 5 years given the ER/PR positivity of her disease. Pretreatment MUGA on February 12, 2017 is  adequate to proceed with an EF of 75%. CT of the chest, abdomen, pelvis did not reveal any evidence of metastatic disease. Proceed with cycle 2 of 4 of Adriamycin and Cytoxan with Neulasta support. Return to clinic in 2 weeks for consideration of cycle 3. 2. Rheumatoid arthritis: Patient has discontinued Enbrel since the diagnosis of her malignancy. Since this is a solid tumor, okay to restart from an oncology standpoint once chemotherapy has been completed. Since initiating chemotherapy her symptoms have significantly improved. 3. Neutropenia: Continue Neulasta as above. 4. Pancytopenia: Resolved. Secondary to chemotherapy. Monitor. 5. Nausea: Continue Zofran and Compazine as prescribed. 6. Diarrhea: Continue OTC Imodium as needed.  Patient expressed understanding and was in agreement with this plan. She also understands that She can call clinic at any time with any questions, concerns, or complaints.   Cancer Staging Malignant neoplasm of upper-outer quadrant of left breast in female, estrogen receptor negative ([place]) Staging form: Breast, AJCC 8th Edition - Clinical stage from 02/04/2017: Stage IB (cT2, cN0, cM0, G2, ER: Positive, PR: Positive, HER2: Negative) - Signed by Lloyd Huger, MD on 02/15/2017   Lloyd Huger, MD   03/06/2017 7:14 AM

## 2017-03-03 ENCOUNTER — Inpatient Hospital Stay (HOSPITAL_BASED_OUTPATIENT_CLINIC_OR_DEPARTMENT_OTHER): Payer: PPO | Admitting: Oncology

## 2017-03-03 ENCOUNTER — Inpatient Hospital Stay: Payer: PPO

## 2017-03-03 VITALS — BP 117/79 | HR 88 | Resp 98 | Wt 176.0 lb

## 2017-03-03 DIAGNOSIS — Z808 Family history of malignant neoplasm of other organs or systems: Secondary | ICD-10-CM

## 2017-03-03 DIAGNOSIS — R630 Anorexia: Secondary | ICD-10-CM

## 2017-03-03 DIAGNOSIS — I700 Atherosclerosis of aorta: Secondary | ICD-10-CM | POA: Diagnosis not present

## 2017-03-03 DIAGNOSIS — Z7982 Long term (current) use of aspirin: Secondary | ICD-10-CM

## 2017-03-03 DIAGNOSIS — R197 Diarrhea, unspecified: Secondary | ICD-10-CM

## 2017-03-03 DIAGNOSIS — Z171 Estrogen receptor negative status [ER-]: Principal | ICD-10-CM

## 2017-03-03 DIAGNOSIS — C50412 Malignant neoplasm of upper-outer quadrant of left female breast: Secondary | ICD-10-CM

## 2017-03-03 DIAGNOSIS — M797 Fibromyalgia: Secondary | ICD-10-CM

## 2017-03-03 DIAGNOSIS — T451X5S Adverse effect of antineoplastic and immunosuppressive drugs, sequela: Secondary | ICD-10-CM

## 2017-03-03 DIAGNOSIS — Z7952 Long term (current) use of systemic steroids: Secondary | ICD-10-CM

## 2017-03-03 DIAGNOSIS — M069 Rheumatoid arthritis, unspecified: Secondary | ICD-10-CM

## 2017-03-03 DIAGNOSIS — R110 Nausea: Secondary | ICD-10-CM

## 2017-03-03 DIAGNOSIS — K219 Gastro-esophageal reflux disease without esophagitis: Secondary | ICD-10-CM

## 2017-03-03 DIAGNOSIS — M810 Age-related osteoporosis without current pathological fracture: Secondary | ICD-10-CM

## 2017-03-03 DIAGNOSIS — Z803 Family history of malignant neoplasm of breast: Secondary | ICD-10-CM

## 2017-03-03 DIAGNOSIS — E039 Hypothyroidism, unspecified: Secondary | ICD-10-CM

## 2017-03-03 DIAGNOSIS — Z79899 Other long term (current) drug therapy: Secondary | ICD-10-CM

## 2017-03-03 DIAGNOSIS — R634 Abnormal weight loss: Secondary | ICD-10-CM

## 2017-03-03 DIAGNOSIS — Z170 Estrogen receptor positive status [ER+]: Secondary | ICD-10-CM

## 2017-03-03 DIAGNOSIS — Z87891 Personal history of nicotine dependence: Secondary | ICD-10-CM

## 2017-03-03 DIAGNOSIS — D701 Agranulocytosis secondary to cancer chemotherapy: Secondary | ICD-10-CM | POA: Diagnosis not present

## 2017-03-03 DIAGNOSIS — Z87442 Personal history of urinary calculi: Secondary | ICD-10-CM

## 2017-03-03 DIAGNOSIS — Z5111 Encounter for antineoplastic chemotherapy: Secondary | ICD-10-CM | POA: Diagnosis not present

## 2017-03-03 LAB — COMPREHENSIVE METABOLIC PANEL
ALBUMIN: 3.2 g/dL — AB (ref 3.5–5.0)
ALK PHOS: 87 U/L (ref 38–126)
ALT: 17 U/L (ref 14–54)
ANION GAP: 7 (ref 5–15)
AST: 35 U/L (ref 15–41)
BUN: 13 mg/dL (ref 6–20)
CALCIUM: 8.7 mg/dL — AB (ref 8.9–10.3)
CO2: 24 mmol/L (ref 22–32)
Chloride: 105 mmol/L (ref 101–111)
Creatinine, Ser: 0.72 mg/dL (ref 0.44–1.00)
GFR calc Af Amer: >60 mL/min (ref >60)
GFR calc non Af Amer: >60 mL/min (ref >60)
GLUCOSE: 98 mg/dL (ref 65–99)
POTASSIUM: 4.1 mmol/L (ref 3.5–5.1)
SODIUM: 136 mmol/L (ref 135–145)
Total Bilirubin: 0.3 mg/dL (ref 0.3–1.2)
Total Protein: 6.6 g/dL (ref 6.5–8.1)

## 2017-03-03 LAB — CBC WITH DIFFERENTIAL/PLATELET
BASOS ABS: 0.1 10*3/uL (ref 0–0.1)
BASOS PCT: 1 %
Eosinophils Absolute: 0 10*3/uL (ref 0–0.7)
Eosinophils Relative: 0 %
HCT: 37 % (ref 35.0–47.0)
HEMOGLOBIN: 12.6 g/dL (ref 12.0–16.0)
Lymphocytes Relative: 16 %
Lymphs Abs: 1.5 10*3/uL (ref 1.0–3.6)
MCH: 29.5 pg (ref 26.0–34.0)
MCHC: 34.1 g/dL (ref 32.0–36.0)
MCV: 86.6 fL (ref 80.0–100.0)
Monocytes Absolute: 0.8 10*3/uL (ref 0.2–0.9)
Monocytes Relative: 9 %
NEUTROS ABS: 7.2 10*3/uL — AB (ref 1.4–6.5)
NEUTROS PCT: 74 %
Platelets: 378 10*3/uL (ref 150–440)
RBC: 4.28 MIL/uL (ref 3.80–5.20)
RDW: 13.4 % (ref 11.5–14.5)
WBC: 9.6 10*3/uL (ref 3.6–11.0)

## 2017-03-03 MED ORDER — SODIUM CHLORIDE 0.9% FLUSH
10.0000 mL | INTRAVENOUS | Status: DC | PRN
  Administered 2017-03-03: 10 mL via INTRAVENOUS
  Filled 2017-03-03: qty 10

## 2017-03-03 MED ORDER — SODIUM CHLORIDE 0.9 % IV SOLN
Freq: Once | INTRAVENOUS | Status: AC
  Administered 2017-03-03: 12:00:00 via INTRAVENOUS
  Filled 2017-03-03: qty 1000

## 2017-03-03 MED ORDER — FOSAPREPITANT DIMEGLUMINE INJECTION 150 MG
Freq: Once | INTRAVENOUS | Status: AC
  Administered 2017-03-03: 13:00:00 via INTRAVENOUS
  Filled 2017-03-03: qty 5

## 2017-03-03 MED ORDER — DOXORUBICIN HCL CHEMO IV INJECTION 2 MG/ML
60.0000 mg/m2 | Freq: Once | INTRAVENOUS | Status: AC
  Administered 2017-03-03: 116 mg via INTRAVENOUS
  Filled 2017-03-03: qty 58

## 2017-03-03 MED ORDER — PEGFILGRASTIM 6 MG/0.6ML ~~LOC~~ PSKT
6.0000 mg | PREFILLED_SYRINGE | Freq: Once | SUBCUTANEOUS | Status: AC
  Administered 2017-03-03: 6 mg via SUBCUTANEOUS

## 2017-03-03 MED ORDER — SODIUM CHLORIDE 0.9 % IV SOLN
600.0000 mg/m2 | Freq: Once | INTRAVENOUS | Status: AC
  Administered 2017-03-03: 1160 mg via INTRAVENOUS
  Filled 2017-03-03: qty 50

## 2017-03-03 MED ORDER — HEPARIN SOD (PORK) LOCK FLUSH 100 UNIT/ML IV SOLN
500.0000 [IU] | Freq: Once | INTRAVENOUS | Status: AC
  Administered 2017-03-03: 500 [IU] via INTRAVENOUS

## 2017-03-03 MED ORDER — PALONOSETRON HCL INJECTION 0.25 MG/5ML
0.2500 mg | Freq: Once | INTRAVENOUS | Status: AC
  Administered 2017-03-03: 0.25 mg via INTRAVENOUS
  Filled 2017-03-03: qty 5

## 2017-03-03 NOTE — Progress Notes (Signed)
Patient here today for ongoing follow up and treatment consideration regarding breast cancer. Patient denies any concerns today.

## 2017-03-10 ENCOUNTER — Inpatient Hospital Stay
Admission: EM | Admit: 2017-03-10 | Discharge: 2017-03-13 | DRG: 810 | Disposition: A | Discharge status: Home / Self Care | Payer: PPO | Attending: Internal Medicine | Admitting: Internal Medicine

## 2017-03-10 ENCOUNTER — Encounter: Payer: Self-pay | Admitting: Emergency Medicine

## 2017-03-10 ENCOUNTER — Emergency Department: Payer: PPO

## 2017-03-10 ENCOUNTER — Telehealth: Payer: Self-pay | Admitting: *Deleted

## 2017-03-10 DIAGNOSIS — Z170 Estrogen receptor positive status [ER+]: Secondary | ICD-10-CM

## 2017-03-10 DIAGNOSIS — F419 Anxiety disorder, unspecified: Secondary | ICD-10-CM | POA: Diagnosis present

## 2017-03-10 DIAGNOSIS — D701 Agranulocytosis secondary to cancer chemotherapy: Secondary | ICD-10-CM | POA: Diagnosis not present

## 2017-03-10 DIAGNOSIS — Z87891 Personal history of nicotine dependence: Secondary | ICD-10-CM | POA: Diagnosis not present

## 2017-03-10 DIAGNOSIS — D709 Neutropenia, unspecified: Secondary | ICD-10-CM | POA: Diagnosis not present

## 2017-03-10 DIAGNOSIS — Z7952 Long term (current) use of systemic steroids: Secondary | ICD-10-CM

## 2017-03-10 DIAGNOSIS — C50912 Malignant neoplasm of unspecified site of left female breast: Secondary | ICD-10-CM | POA: Diagnosis not present

## 2017-03-10 DIAGNOSIS — R5081 Fever presenting with conditions classified elsewhere: Secondary | ICD-10-CM | POA: Diagnosis present

## 2017-03-10 DIAGNOSIS — Z9221 Personal history of antineoplastic chemotherapy: Secondary | ICD-10-CM | POA: Diagnosis not present

## 2017-03-10 DIAGNOSIS — M069 Rheumatoid arthritis, unspecified: Secondary | ICD-10-CM | POA: Diagnosis not present

## 2017-03-10 DIAGNOSIS — M810 Age-related osteoporosis without current pathological fracture: Secondary | ICD-10-CM | POA: Diagnosis present

## 2017-03-10 DIAGNOSIS — F329 Major depressive disorder, single episode, unspecified: Secondary | ICD-10-CM | POA: Diagnosis not present

## 2017-03-10 DIAGNOSIS — R634 Abnormal weight loss: Secondary | ICD-10-CM | POA: Diagnosis present

## 2017-03-10 DIAGNOSIS — D6959 Other secondary thrombocytopenia: Secondary | ICD-10-CM | POA: Diagnosis not present

## 2017-03-10 DIAGNOSIS — Z87442 Personal history of urinary calculi: Secondary | ICD-10-CM | POA: Diagnosis not present

## 2017-03-10 DIAGNOSIS — D61810 Antineoplastic chemotherapy induced pancytopenia: Secondary | ICD-10-CM | POA: Diagnosis not present

## 2017-03-10 DIAGNOSIS — D61818 Other pancytopenia: Secondary | ICD-10-CM | POA: Diagnosis not present

## 2017-03-10 DIAGNOSIS — Z95828 Presence of other vascular implants and grafts: Secondary | ICD-10-CM

## 2017-03-10 DIAGNOSIS — Z96649 Presence of unspecified artificial hip joint: Secondary | ICD-10-CM | POA: Diagnosis present

## 2017-03-10 DIAGNOSIS — Z79899 Other long term (current) drug therapy: Secondary | ICD-10-CM

## 2017-03-10 DIAGNOSIS — Z66 Do not resuscitate: Secondary | ICD-10-CM | POA: Diagnosis present

## 2017-03-10 DIAGNOSIS — K5900 Constipation, unspecified: Secondary | ICD-10-CM | POA: Diagnosis not present

## 2017-03-10 DIAGNOSIS — R000 Tachycardia, unspecified: Secondary | ICD-10-CM | POA: Diagnosis not present

## 2017-03-10 DIAGNOSIS — Z792 Long term (current) use of antibiotics: Secondary | ICD-10-CM | POA: Diagnosis not present

## 2017-03-10 DIAGNOSIS — E039 Hypothyroidism, unspecified: Secondary | ICD-10-CM | POA: Diagnosis present

## 2017-03-10 DIAGNOSIS — C50919 Malignant neoplasm of unspecified site of unspecified female breast: Secondary | ICD-10-CM | POA: Diagnosis not present

## 2017-03-10 DIAGNOSIS — M797 Fibromyalgia: Secondary | ICD-10-CM | POA: Diagnosis present

## 2017-03-10 DIAGNOSIS — N200 Calculus of kidney: Secondary | ICD-10-CM | POA: Diagnosis not present

## 2017-03-10 DIAGNOSIS — M8589 Other specified disorders of bone density and structure, multiple sites: Secondary | ICD-10-CM | POA: Diagnosis not present

## 2017-03-10 DIAGNOSIS — Z888 Allergy status to other drugs, medicaments and biological substances status: Secondary | ICD-10-CM

## 2017-03-10 DIAGNOSIS — C50412 Malignant neoplasm of upper-outer quadrant of left female breast: Secondary | ICD-10-CM | POA: Diagnosis not present

## 2017-03-10 DIAGNOSIS — K219 Gastro-esophageal reflux disease without esophagitis: Secondary | ICD-10-CM | POA: Diagnosis present

## 2017-03-10 DIAGNOSIS — R319 Hematuria, unspecified: Secondary | ICD-10-CM | POA: Diagnosis present

## 2017-03-10 DIAGNOSIS — M5032 Other cervical disc degeneration, mid-cervical region, unspecified level: Secondary | ICD-10-CM | POA: Diagnosis not present

## 2017-03-10 DIAGNOSIS — T451X5A Adverse effect of antineoplastic and immunosuppressive drugs, initial encounter: Secondary | ICD-10-CM | POA: Diagnosis present

## 2017-03-10 DIAGNOSIS — T451X5S Adverse effect of antineoplastic and immunosuppressive drugs, sequela: Secondary | ICD-10-CM | POA: Diagnosis not present

## 2017-03-10 LAB — URINALYSIS, ROUTINE W REFLEX MICROSCOPIC
Bacteria, UA: NONE SEEN
Bilirubin Urine: NEGATIVE
GLUCOSE, UA: NEGATIVE mg/dL
Ketones, ur: 5 mg/dL — AB
Leukocytes, UA: NEGATIVE
NITRITE: NEGATIVE
PH: 5 (ref 5.0–8.0)
PROTEIN: 100 mg/dL — AB
Specific Gravity, Urine: 1.02 (ref 1.005–1.030)

## 2017-03-10 LAB — CBC WITH DIFFERENTIAL/PLATELET
Basophils Absolute: 0 10*3/uL (ref 0–0.1)
Basophils Relative: 0 %
EOS ABS: 0 10*3/uL (ref 0–0.7)
Eosinophils Relative: 2 %
HCT: 34.4 % — ABNORMAL LOW (ref 35.0–47.0)
HEMOGLOBIN: 11.7 g/dL — AB (ref 12.0–16.0)
Lymphocytes Relative: 81 %
Lymphs Abs: 0.3 10*3/uL — ABNORMAL LOW (ref 1.0–3.6)
MCH: 29.7 pg (ref 26.0–34.0)
MCHC: 34.1 g/dL (ref 32.0–36.0)
MCV: 87 fL (ref 80.0–100.0)
MONO ABS: 0.1 10*3/uL — AB (ref 0.2–0.9)
Monocytes Relative: 15 %
NEUTROS PCT: 2 %
Neutro Abs: 0 10*3/uL — ABNORMAL LOW (ref 1.4–6.5)
PLATELETS: 74 10*3/uL — AB (ref 150–440)
RBC: 3.95 MIL/uL (ref 3.80–5.20)
RDW: 13.9 % (ref 11.5–14.5)
WBC: 0.4 10*3/uL — AB (ref 3.6–11.0)

## 2017-03-10 LAB — COMPREHENSIVE METABOLIC PANEL
ALK PHOS: 88 U/L (ref 38–126)
ALT: 20 U/L (ref 14–54)
AST: 22 U/L (ref 15–41)
Albumin: 3.1 g/dL — ABNORMAL LOW (ref 3.5–5.0)
Anion gap: 8 (ref 5–15)
BILIRUBIN TOTAL: 0.6 mg/dL (ref 0.3–1.2)
BUN: 18 mg/dL (ref 6–20)
CALCIUM: 8.7 mg/dL — AB (ref 8.9–10.3)
CO2: 24 mmol/L (ref 22–32)
CREATININE: 0.67 mg/dL (ref 0.44–1.00)
Chloride: 102 mmol/L (ref 101–111)
GFR calc Af Amer: >60 mL/min (ref >60)
Glucose, Bld: 128 mg/dL — ABNORMAL HIGH (ref 65–99)
Potassium: 3.9 mmol/L (ref 3.5–5.1)
Sodium: 134 mmol/L — ABNORMAL LOW (ref 135–145)
TOTAL PROTEIN: 6.3 g/dL — AB (ref 6.5–8.1)

## 2017-03-10 LAB — LACTIC ACID, PLASMA: Lactic Acid, Venous: 1.8 mmol/L (ref 0.5–1.9)

## 2017-03-10 LAB — TROPONIN I: Troponin I: 0.05 ng/mL (ref <0.03)

## 2017-03-10 MED ORDER — PROCHLORPERAZINE MALEATE 10 MG PO TABS
10.0000 mg | ORAL_TABLET | Freq: Four times a day (QID) | ORAL | Status: DC | PRN
  Filled 2017-03-10: qty 1

## 2017-03-10 MED ORDER — DEXTROSE 5 % IV SOLN
2.0000 g | Freq: Once | INTRAVENOUS | Status: AC
  Administered 2017-03-10: 2 g via INTRAVENOUS
  Filled 2017-03-10: qty 2

## 2017-03-10 MED ORDER — SODIUM CHLORIDE 0.9 % IV BOLUS (SEPSIS)
1000.0000 mL | Freq: Once | INTRAVENOUS | Status: AC
  Administered 2017-03-10: 1000 mL via INTRAVENOUS

## 2017-03-10 MED ORDER — SODIUM CHLORIDE 0.9% FLUSH
10.0000 mL | Freq: Two times a day (BID) | INTRAVENOUS | Status: DC
  Administered 2017-03-11 – 2017-03-13 (×4): 10 mL

## 2017-03-10 MED ORDER — ONDANSETRON HCL 4 MG/2ML IJ SOLN: 4.0000 mg | Freq: Four times a day (QID) | INTRAMUSCULAR | Status: DC | PRN

## 2017-03-10 MED ORDER — ALPRAZOLAM 0.5 MG PO TABS
0.2500 mg | ORAL_TABLET | Freq: Two times a day (BID) | ORAL | Status: DC | PRN
  Administered 2017-03-10 – 2017-03-11 (×2): 0.25 mg via ORAL
  Filled 2017-03-10 (×2): qty 1

## 2017-03-10 MED ORDER — SODIUM CHLORIDE 0.9 % IV BOLUS (SEPSIS)
500.0000 mL | Freq: Once | INTRAVENOUS | Status: AC
  Administered 2017-03-10: 500 mL via INTRAVENOUS

## 2017-03-10 MED ORDER — SODIUM CHLORIDE 0.9 % IV BOLUS (SEPSIS)
1000.0000 mL | Freq: Once | INTRAVENOUS | Status: AC
Start: 2017-03-10 — End: 2017-03-10
  Administered 2017-03-10: 1000 mL via INTRAVENOUS

## 2017-03-10 MED ORDER — CEFEPIME HCL 2 G IV SOLR: 2.0000 g | Freq: Three times a day (TID) | INTRAVENOUS | Status: DC

## 2017-03-10 MED ORDER — SODIUM CHLORIDE 0.9% FLUSH: 10.0000 mL | INTRAVENOUS | Status: DC | PRN

## 2017-03-10 MED ORDER — ACETAMINOPHEN 650 MG RE SUPP: 650.0000 mg | Freq: Four times a day (QID) | RECTAL | Status: DC | PRN

## 2017-03-10 MED ORDER — FILGRASTIM 300 MCG/ML IJ SOLN
300.0000 ug | Freq: Every day | INTRAMUSCULAR | Status: DC
  Administered 2017-03-10 – 2017-03-11 (×2): 300 ug via SUBCUTANEOUS
  Filled 2017-03-10 (×2): qty 1

## 2017-03-10 MED ORDER — LEVOTHYROXINE SODIUM 137 MCG PO TABS
137.0000 ug | ORAL_TABLET | Freq: Every day | ORAL | Status: DC
  Administered 2017-03-11 – 2017-03-13 (×3): 137 ug via ORAL
  Filled 2017-03-10 (×4): qty 1

## 2017-03-10 MED ORDER — ACETAMINOPHEN 325 MG PO TABS
650.0000 mg | ORAL_TABLET | Freq: Four times a day (QID) | ORAL | Status: DC | PRN
  Administered 2017-03-10 – 2017-03-11 (×2): 650 mg via ORAL
  Filled 2017-03-10 (×2): qty 2

## 2017-03-10 MED ORDER — SODIUM CHLORIDE 0.9 % IV SOLN
INTRAVENOUS | Status: DC
  Administered 2017-03-10 – 2017-03-12 (×4): via INTRAVENOUS

## 2017-03-10 MED ORDER — VANCOMYCIN HCL IN DEXTROSE 1-5 GM/200ML-% IV SOLN
1000.0000 mg | Freq: Once | INTRAVENOUS | Status: AC
  Administered 2017-03-10: 1000 mg via INTRAVENOUS
  Filled 2017-03-10: qty 200

## 2017-03-10 MED ORDER — DEXTROSE 5 % IV SOLN
2.0000 g | Freq: Three times a day (TID) | INTRAVENOUS | Status: DC
  Administered 2017-03-11 – 2017-03-13 (×8): 2 g via INTRAVENOUS
  Filled 2017-03-10 (×10): qty 2

## 2017-03-10 NOTE — Telephone Encounter (Signed)
Called to report she is having chills and fever 102.4, having chills and tachycardia; HR now at 90 resting. She had first [facility] 5/16. She did get On Pro Neulasta. Please advise

## 2017-03-10 NOTE — ED Provider Notes (Signed)
[facility] Provider Note   CSN: [id number] Arrival date & time: 03/10/17  1509     History   Chief Complaint Chief Complaint  Patient presents with  . Fever    HPI Samantha Valentine is a 71 y.o. female hx of breast cancer, depression, fibromyalgia, Here presenting with palpitations, chills, fever. She states that her last chemotherapy was about a week ago at the cancer center. Today while she was eating lunch, she reported a fever 102. Also felt like her Heart was racing. She had some vomiting yesterday but she states that this is common after chemotherapy. She has some loose stools but denies any abdominal pain. Patient received chemotherapy about a week ago and follows with Dr. Grayland Ormond.      The history is provided by the patient.    Past Medical History:  Diagnosis Date  . Arthritis    RA  . Breast mass 1 year   . Cancer ([place]) 01/29/2017   left breast INVASIVE MAMMARY CARCINOMA, ER/PR positive  . Collagen vascular disease (HCC)    Rhematoid Arthritis  . DDD (degenerative disc disease)    in neck  . Depression   . Fibromyalgia   . GERD (gastroesophageal reflux disease)    NO MEDS  . History of kidney stones    MULTIPLE KIDNEY STONES BIL  . Hypothyroidism   . Migraine   . Osteoporosis     Patient Active Problem List   Diagnosis Date Noted  . Malignant neoplasm of upper-outer quadrant of left breast in female, estrogen receptor negative ([place]) 02/04/2017  . Tendinopathy of right shoulder 01/25/2017  . Osteopenia of multiple sites 01/25/2017  . Breast lump in female 01/04/2017  . High risk medication use 09/29/2016  . DJD (degenerative joint disease), cervical 09/29/2016  . History of right hip replacement 09/29/2016  . Eczema 07/13/2014  . Adverse effect of immunosuppressive drug 04/27/2012  . ANXIETY DEPRESSION 10/28/2008  . BACK PAIN 12/29/2007  . Hypothyroidism 12/28/2007  . Vitamin D deficiency 12/28/2007  . HEARING LOSS 12/28/2007  .  Seropositive rheumatoid arthritis of multiple sites ([place]) 12/28/2007  . DDD (degenerative disc disease), lumbar 12/28/2007  . Fibromyalgia 12/28/2007  . Osteoporosis 12/28/2007  . MIGRAINES, HX OF 12/28/2007    Past Surgical History:  Procedure Laterality Date  . BREAST BIOPSY Left 01/29/2017   US guided biopsy INVASIVE MAMMARY CARCINOMA  . JOINT REPLACEMENT  2003   total hip replacement  . LITHOTRIPSY  1997   kidney stone  . PORTACATH PLACEMENT Right 02/15/2017   Procedure: INSERTION PORT-A-CATH;  Surgeon: Robert Bellow, MD;  Location: [facility] ORS;  Service: General;  Laterality: Right;  . TONSILLECTOMY  1970    OB History    Gravida Para Term Preterm AB Living   2 1     1      SAB TAB Ectopic Multiple Live Births   1              Obstetric Comments   1st Menstrual Cycle:  14  1st Pregnancy:  31       Home Medications    Prior to Admission medications   Medication Sig Start Date End Date Taking? Authorizing Provider  ALPRAZolam (XANAX) 0.25 MG tablet Take 1 tablet (0.25 mg total) by mouth 2 (two) times daily as needed for anxiety. 02/17/17   Lloyd Huger, MD  aspirin EC 81 MG tablet Take 81 mg by mouth daily.    [provider]  CELEBREX 200 MG  capsule Take 200 mg by mouth 2 (two) times daily.  07/05/11   [provider]  Cholecalciferol 1000 units TBDP Take 1,000 Units by mouth daily.    [provider]  levothyroxine (SYNTHROID, LEVOTHROID) 137 MCG tablet Take 1 tablet (137 mcg total) by mouth daily before breakfast. 10/02/16   Tower, Wynelle Fanny, MD  lidocaine-prilocaine (EMLA) cream Apply to affected area once 02/10/17   Lloyd Huger, MD  ondansetron (ZOFRAN) 8 MG tablet Take 1 tablet (8 mg total) by mouth 2 (two) times daily as needed. 02/10/17   Lloyd Huger, MD  predniSONE (DELTASONE) 5 MG tablet Take 4 tab QD PO x 4 days, then 3 tab QD x 4 days, then 2 tab QD x 4 days, then 1 tab QD x 4 days, then stop 01/28/17   Bo Merino, MD  prochlorperazine (COMPAZINE) 10 MG tablet Take 1 tablet (10 mg total) by mouth every 6 (six) hours as needed (Nausea or vomiting). 02/10/17   Lloyd Huger, MD  traMADol (ULTRAM) 50 MG tablet Take 1 tablet (50 mg total) by mouth every 4 (four) hours as needed. 02/15/17   Robert Bellow, MD    Family History Family History  Problem Relation Age of Onset  . Alcohol abuse Father   . Diabetes Father   . Stroke Father   . Hypertension Mother   . Endometrial cancer Mother   . Osteoarthritis Mother   . Breast cancer Paternal Aunt 30  . Liver disease Sister   . Breast cancer Maternal Aunt   . Rheum arthritis Maternal Grandmother   . Diabetes Paternal Grandmother   . Parkinson's disease Paternal Grandfather     Social History Social History  Substance Use Topics  . Smoking status: Former Smoker    Packs/day: 1.50    Years: 25.00    Types: Cigarettes    Quit date: 10/20/1983  . Smokeless tobacco: Never Used  . Alcohol use No     Allergies   Hydroxychloroquine sulfate; Ibandronate sodium; and Risedronate sodium   Review of Systems Review of Systems  Constitutional: Positive for chills and fever.  All other systems reviewed and are negative.    Physical Exam Updated Vital Signs BP (!) 108/59   Pulse (!) 109   Temp 98.9 F (37.2 C) (Oral)   Resp 20   Ht 5\' 4"  (1.626 m)   Wt 77.1 kg (170 lb)   SpO2 96%   BMI 29.18 kg/m   Physical Exam  Constitutional:  Chronically ill   HENT:  Head: Normocephalic.  MM dry   Eyes: EOM are normal. Pupils are equal, round, and reactive to light.  Neck: Normal range of motion. Neck supple.  Cardiovascular: Regular rhythm and normal heart sounds.   Tachycardic   Pulmonary/Chest: Effort normal and breath sounds normal. No respiratory distress. She has no wheezes.  Port in place, no obvious surrounding cellulitis   Abdominal: Soft. Bowel sounds are normal. She exhibits no distension. There is no tenderness.    Musculoskeletal: Normal range of motion. She exhibits no edema.  Neurological: She is alert.  Skin: Skin is warm.  Psychiatric: She has a normal mood and affect.  Nursing note and vitals reviewed.    ED Treatments / Results  Labs (all labs ordered are listed, but only abnormal results are displayed) Labs Reviewed  COMPREHENSIVE METABOLIC PANEL - Abnormal; Notable for the following:       Result Value   Sodium 134 (*)  Glucose, Bld 128 (*)    Calcium 8.7 (*)    Total Protein 6.3 (*)    Albumin 3.1 (*)    All other components within normal limits  CBC WITH DIFFERENTIAL/PLATELET - Abnormal; Notable for the following:    WBC 0.4 (*)    Hemoglobin 11.7 (*)    HCT 34.4 (*)    Platelets 74 (*)    Neutro Abs 0.0 (*)    Lymphs Abs 0.3 (*)    Monocytes Absolute 0.1 (*)    All other components within normal limits  TROPONIN I - Abnormal; Notable for the following:    Troponin I 0.05 (*)    All other components within normal limits  CULTURE, BLOOD (ROUTINE X 2)  CULTURE, BLOOD (ROUTINE X 2)  LACTIC ACID, PLASMA  URINALYSIS, ROUTINE W REFLEX MICROSCOPIC  LACTIC ACID, PLASMA    EKG  EKG Interpretation None      ED ECG REPORT I, Wandra Arthurs, the attending physician, personally viewed and interpreted this ECG.   Date: 03/10/2017  EKG Time: 15:35 pm  Rate: 107  Rhythm: sinus tachycardia  Axis: normal  Intervals:none  ST&T Change: nonspecific    Radiology Dg Chest 2 View  Result Date: 03/10/2017 CLINICAL DATA:  Fever started today at noon.  Breast cancer. EXAM: CHEST  2 VIEW COMPARISON:  One-view chest x-ray a 02/15/2017 FINDINGS: The heart size is normal. A right subclavian Port-A-Cath is stable in position. There is no significant edema or effusion. Minimal atelectasis is present without significant airspace disease. The visualized soft tissues and bony thorax are unremarkable. IMPRESSION: 1. No acute cardiopulmonary disease or signal change. 2. Stable positioning of  right subclavian Port-A-Cath. Electronically Signed   By: San Morelle M.D.   On: 03/10/2017 16:09    Procedures Procedures (including critical care time)  CRITICAL CARE Performed by: Wandra Arthurs   Total critical care time: 30  minutes  Critical care time was exclusive of separately billable procedures and treating other patients.  Critical care was necessary to treat or prevent imminent or life-threatening deterioration.  Critical care was time spent personally by me on the following activities: development of treatment plan with patient and/or surrogate as well as nursing, discussions with consultants, evaluation of patient's response to treatment, examination of patient, obtaining history from patient or surrogate, ordering and performing treatments and interventions, ordering and review of laboratory studies, ordering and review of radiographic studies, pulse oximetry and re-evaluation of patient's condition.   Medications Ordered in ED Medications  sodium chloride 0.9 % bolus 1,000 mL (1,000 mLs Intravenous New Bag/Given 03/10/17 1537)    And  sodium chloride 0.9 % bolus 1,000 mL (1,000 mLs Intravenous New Bag/Given 03/10/17 1640)    And  sodium chloride 0.9 % bolus 500 mL (not administered)  ceFEPIme (MAXIPIME) 2 g in dextrose 5 % 50 mL IVPB (2 g Intravenous New Bag/Given 03/10/17 1640)  vancomycin (VANCOCIN) IVPB 1000 mg/200 mL premix (not administered)     Initial Impression / Assessment and Plan / ED Course  I have reviewed the triage vital signs and the nursing notes.  Pertinent labs & imaging results that were available during my care of the patient were reviewed by me and considered in my medical decision making (see chart for details).     Samantha Valentine is a 71 y.o. female here with fever, chills. Patient had chemo a week ago. I am concerned that she may have neutropenic fever or bacteremic. Since she  had fever, tachycardia, will activate code sepsis. Will give  30 cc/kg bolus and initiate sepsis workup.    5:01 PM WBC 0.4. ANC 0. Patient likely has neutropenic fever. Since she has a port, I gave her vanc/cefepime. CXR clear. UA pending. Blood culture sent. Hospitalist to admit.    Final Clinical Impressions(s) / ED Diagnoses   Final diagnoses:  None    New Prescriptions New Prescriptions   No medications on file     Drenda Freeze, MD 03/10/17 1701

## 2017-03-10 NOTE — Telephone Encounter (Signed)
Samantha Valentine spoke with Dr B who advises she go to the ER, [name] will return call to [facility]

## 2017-03-10 NOTE — ED Triage Notes (Signed)
Patient presents to ED via POV from home with c/o fevers. Breast cancer patient, last chemo was 1 week ago 05/16. Patient reports since yesterday she has not been feeling well. Family report a temp at home of 102. Afebrile in triage. Family denies any use of antipyretics. A&O x4.

## 2017-03-10 NOTE — ED Notes (Signed)
CODE SEPSIS CALLED TO DOUG AT [facility] 

## 2017-03-10 NOTE — Progress Notes (Signed)
Pharmacy Antibiotic Note  Samantha Valentine is a 71 y.o. female admitted on 03/10/2017 with neutropenic fever.  Pharmacy has been consulted for cefepime dosing.  Plan: Cefepime 2g IV Q8hr   Height: 5\' 4"  (162.6 cm) Weight: 170 lb (77.1 kg) IBW/kg (Calculated) : 54.7  Temp (24hrs), Avg:98.9 F (37.2 C), Min:98.9 F (37.2 C), Max:98.9 F (37.2 C)   Recent Labs Lab 03/10/17 1528  WBC 0.4*  CREATININE 0.67  LATICACIDVEN 1.8    Estimated Creatinine Clearance: 66.7 mL/min (by C-G formula based on SCr of 0.67 mg/dL).    Allergies  Allergen Reactions  . Hydroxychloroquine Sulfate     REACTION: rash  . Ibandronate Sodium     REACTION: rash  . Risedronate Sodium     REACTION: rash    Antimicrobials this admission: Cefepime 5/23 >> Vancomycin x1  Microbiology results: 5/23 BCx: in process  Thank you for allowing pharmacy to be a part of this patient's care.  Loree Fee, PharmD 03/10/2017 7:07 PM

## 2017-03-10 NOTE — H&P (Signed)
[facility] NAME: Samantha Valentine    MR#:  [id number]  DATE OF BIRTH:  Aug 05, 1946  DATE OF ADMISSION:  03/10/2017  PRIMARY CARE PHYSICIAN: Abner Greenspan, MD   REQUESTING/REFERRING PHYSICIAN: Dr Shirlyn Goltz  CHIEF COMPLAINT:   Chief Complaint  Patient presents with  . Fever    HISTORY OF PRESENT ILLNESS:  Aide Wojnar  is a 71 y.o. female with a known history of Breast cancer with recent chemotherapy presents to the hospital with fever of 102. She complains of a shaking chill and a fever of 102. She had some nausea vomiting last night. Her heart rate has been up. She's had some weight loss. No abdominal pain. But has dark urine.  PAST MEDICAL HISTORY:   Past Medical History:  Diagnosis Date  . Arthritis    RA  . Breast mass 1 year   . Cancer ([place]) 01/29/2017   left breast INVASIVE MAMMARY CARCINOMA, ER/PR positive  . Collagen vascular disease (HCC)    Rhematoid Arthritis  . DDD (degenerative disc disease)    in neck  . Depression   . Fibromyalgia   . GERD (gastroesophageal reflux disease)    NO MEDS  . History of kidney stones    MULTIPLE KIDNEY STONES BIL  . Hypothyroidism   . Migraine   . Osteoporosis     PAST SURGICAL HISTORY:   Past Surgical History:  Procedure Laterality Date  . BREAST BIOPSY Left 01/29/2017   US guided biopsy INVASIVE MAMMARY CARCINOMA  . JOINT REPLACEMENT  2003   total hip replacement  . LITHOTRIPSY  1997   kidney stone  . PORTACATH PLACEMENT Right 02/15/2017   Procedure: INSERTION PORT-A-CATH;  Surgeon: Robert Bellow, MD;  Location: [facility] ORS;  Service: General;  Laterality: Right;  . TONSILLECTOMY  1970    SOCIAL HISTORY:   Social History  Substance Use Topics  . Smoking status: Former Smoker    Packs/day: 1.50    Years: 25.00    Types: Cigarettes    Quit date: 10/20/1983  . Smokeless tobacco: Never Used  . Alcohol use No    FAMILY HISTORY:   Family History   Problem Relation Age of Onset  . Alcohol abuse Father   . Diabetes Father   . Stroke Father   . Hypertension Mother   . Endometrial cancer Mother   . Osteoarthritis Mother   . Breast cancer Paternal Aunt 25  . Liver disease Sister   . Breast cancer Maternal Aunt   . Rheum arthritis Maternal Grandmother   . Diabetes Paternal Grandmother   . Parkinson's disease Paternal Grandfather     DRUG ALLERGIES:   Allergies  Allergen Reactions  . Hydroxychloroquine Sulfate     REACTION: rash  . Ibandronate Sodium     REACTION: rash  . Risedronate Sodium     REACTION: rash    REVIEW OF SYSTEMS:  CONSTITUTIONAL: Positive for fever, and shaking chills. Positive for fatigue and weakness. Positive for weight loss 10 pounds in 2 weeks EYES: Poor vision with chemotherapy EARS, NOSE, AND THROAT: No tinnitus or ear pain. No sore throat. RESPIRATORY: No cough, some shortness of breath, no wheezing or hemoptysis.  CARDIOVASCULAR: No chest pain. Positive for palpitation GASTROINTESTINAL: Positive for nausea and vomiting last night. Occasional diarrhea. No abdominal pain. No blood in bowel movements GENITOURINARY: No dysuria, urine is dark ENDOCRINE: No polyuria, nocturia,  HEMATOLOGY: No anemia, easy bruising or bleeding SKIN: No  rash or lesion. MUSCULOSKELETAL: Positive for joint pains with rheumatoid arthritis  NEUROLOGIC: No tingling, numbness, weakness.  PSYCHIATRY: No anxiety or depression.   MEDICATIONS AT HOME:   Prior to Admission medications   Medication Sig Start Date End Date Taking? Authorizing Provider  ALPRAZolam (XANAX) 0.25 MG tablet Take 1 tablet (0.25 mg total) by mouth 2 (two) times daily as needed for anxiety. 02/17/17  Yes Lloyd Huger, MD  CELEBREX 200 MG capsule Take 200 mg by mouth 2 (two) times daily.  07/05/11  Yes [provider]  levothyroxine (SYNTHROID, LEVOTHROID) 137 MCG tablet Take 1 tablet (137 mcg total) by mouth daily before breakfast.  10/02/16  Yes Tower, Wynelle Fanny, MD  lidocaine-prilocaine (EMLA) cream Apply to affected area once 02/10/17   Lloyd Huger, MD  ondansetron (ZOFRAN) 8 MG tablet Take 1 tablet (8 mg total) by mouth 2 (two) times daily as needed. 02/10/17   Lloyd Huger, MD  prochlorperazine (COMPAZINE) 10 MG tablet Take 1 tablet (10 mg total) by mouth every 6 (six) hours as needed (Nausea or vomiting). 02/10/17   Lloyd Huger, MD      VITAL SIGNS:  Blood pressure 117/68, pulse (!) 105, temperature 98.9 F (37.2 C), temperature source Oral, resp. rate (!) 26, height 5\' 4"  (1.626 m), weight 77.1 kg (170 lb), SpO2 95 %.  PHYSICAL EXAMINATION:  GENERAL:  71 y.o.-year-old patient lying in the bed with no acute distress.  EYES: Pupils equal, round, reactive to light and accommodation. No scleral icterus. Extraocular muscles intact.  HEENT: Head atraumatic, normocephalic. Oropharynx and nasopharynx clear.  NECK:  Supple, no jugular venous distention. No thyroid enlargement, no tenderness.  LUNGS: Normal breath sounds bilaterally, no wheezing, rales,rhonchi or crepitation. No use of accessory muscles of respiration.  CARDIOVASCULAR: S1, S2 Tachycardic. No murmurs, rubs, or gallops.  ABDOMEN: Soft, nontender, nondistended. Bowel sounds present. No organomegaly or mass.  EXTREMITIES: No pedal edema, cyanosis, or clubbing.  NEUROLOGIC: Cranial nerves II through XII are intact. Muscle strength 5/5 in all extremities. Sensation intact. Gait not checked.  PSYCHIATRIC: The patient is alert and oriented x 3.  SKIN: No rash, lesion, or ulcer.   LABORATORY PANEL:   CBC  Recent Labs Lab 03/10/17 1528  WBC 0.4*  HGB 11.7*  HCT 34.4*  PLT 74*   ------------------------------------------------------------------------------------------------------------------  Chemistries   Recent Labs Lab 03/10/17 1528  NA 134*  K 3.9  CL 102  CO2 24  GLUCOSE 128*  BUN 18  CREATININE 0.67  CALCIUM 8.7*  AST  22  ALT 20  ALKPHOS 88  BILITOT 0.6   ------------------------------------------------------------------------------------------------------------------  Cardiac Enzymes  Recent Labs Lab 03/10/17 1528  TROPONINI 0.05*   ------------------------------------------------------------------------------------------------------------------  RADIOLOGY:  Dg Chest 2 View  Result Date: 03/10/2017 CLINICAL DATA:  Fever started today at noon.  Breast cancer. EXAM: CHEST  2 VIEW COMPARISON:  One-view chest x-ray a 02/15/2017 FINDINGS: The heart size is normal. A right subclavian Port-A-Cath is stable in position. There is no significant edema or effusion. Minimal atelectasis is present without significant airspace disease. The visualized soft tissues and bony thorax are unremarkable. IMPRESSION: 1. No acute cardiopulmonary disease or signal change. 2. Stable positioning of right subclavian Port-A-Cath. Electronically Signed   By: San Morelle M.D.   On: 03/10/2017 16:09    EKG:   Sinus tachycardia 107 bpm, right ventricular hypertrophy  IMPRESSION AND PLAN:   1. Neutropenic fever with tachycardia. I don't have a clear source at this point.  Urine culture and blood cultures sent off. Empiric Maxipime. Given 1 dose of vancomycin in the ER. The patient declined another CAT scan of the abdomen to reevaluate a partially obstructing stone that was there on last month's CAT scan. She states that she's not having any abdominal pain at this point and would rather not have a CAT scan. I will give Neupogen daily to try to elevate white count. Consult oncology. 2. Breast cancer. Follow-up with Dr. Grayland Ormond as outpatient for management. Patient wishes to be a DO NOT RESUSCITATE. 3. Hypothyroidism unspecified on levothyroxine 4. History of anxiety and depression on Xanax when necessary 5. History of rheumatoid arthritis. Hold Celebrex with blood in the urine 6. Hematuria with Large blood in the urine.  Likely related to kidney stones. Patient the deferred a CT scan at this point. Give IV fluid hydration and continue to monitor.   All the records are reviewed and case discussed with ED provider. Management plans discussed with the patient, family and they are in agreement.  CODE STATUS: DO NOT RESUSCITATE  TOTAL TIME TAKING CARE OF THIS PATIENT: 50 minutes.    Loletha Grayer M.D on 03/10/2017 at 5:51 PM  Between 7am to 6pm - Pager - [phone]  After 6pm call admission pager [phone]  [facility]  [phone]  CC: Primary care physician; Tower, Wynelle Fanny, MD

## 2017-03-10 NOTE — ED Notes (Signed)
Pt to ED with c/o of fever. Pt denies N/V/D. Pt states just not feeling well. Pt's last chemo treatment was approx 1.5 wks ago.

## 2017-03-11 ENCOUNTER — Other Ambulatory Visit: Payer: Self-pay | Admitting: Hematology and Oncology

## 2017-03-11 DIAGNOSIS — M069 Rheumatoid arthritis, unspecified: Secondary | ICD-10-CM

## 2017-03-11 DIAGNOSIS — Z87891 Personal history of nicotine dependence: Secondary | ICD-10-CM

## 2017-03-11 DIAGNOSIS — Z170 Estrogen receptor positive status [ER+]: Secondary | ICD-10-CM

## 2017-03-11 DIAGNOSIS — Z9221 Personal history of antineoplastic chemotherapy: Secondary | ICD-10-CM

## 2017-03-11 DIAGNOSIS — Z792 Long term (current) use of antibiotics: Secondary | ICD-10-CM

## 2017-03-11 DIAGNOSIS — M810 Age-related osteoporosis without current pathological fracture: Secondary | ICD-10-CM

## 2017-03-11 DIAGNOSIS — D6959 Other secondary thrombocytopenia: Secondary | ICD-10-CM

## 2017-03-11 DIAGNOSIS — E039 Hypothyroidism, unspecified: Secondary | ICD-10-CM

## 2017-03-11 DIAGNOSIS — R5081 Fever presenting with conditions classified elsewhere: Secondary | ICD-10-CM

## 2017-03-11 DIAGNOSIS — D701 Agranulocytosis secondary to cancer chemotherapy: Secondary | ICD-10-CM

## 2017-03-11 DIAGNOSIS — T451X5S Adverse effect of antineoplastic and immunosuppressive drugs, sequela: Secondary | ICD-10-CM

## 2017-03-11 DIAGNOSIS — C50412 Malignant neoplasm of upper-outer quadrant of left female breast: Secondary | ICD-10-CM

## 2017-03-11 DIAGNOSIS — M797 Fibromyalgia: Secondary | ICD-10-CM

## 2017-03-11 DIAGNOSIS — M5030 Other cervical disc degeneration, unspecified cervical region: Secondary | ICD-10-CM

## 2017-03-11 DIAGNOSIS — Z79899 Other long term (current) drug therapy: Secondary | ICD-10-CM

## 2017-03-11 DIAGNOSIS — K219 Gastro-esophageal reflux disease without esophagitis: Secondary | ICD-10-CM

## 2017-03-11 LAB — BASIC METABOLIC PANEL
ANION GAP: 5 (ref 5–15)
BUN: 10 mg/dL (ref 6–20)
CALCIUM: 7.9 mg/dL — AB (ref 8.9–10.3)
CO2: 24 mmol/L (ref 22–32)
Chloride: 107 mmol/L (ref 101–111)
Creatinine, Ser: 0.49 mg/dL (ref 0.44–1.00)
GFR calc Af Amer: >60 mL/min (ref >60)
GFR calc non Af Amer: >60 mL/min (ref >60)
GLUCOSE: 103 mg/dL — AB (ref 65–99)
Potassium: 3.7 mmol/L (ref 3.5–5.1)
Sodium: 136 mmol/L (ref 135–145)

## 2017-03-11 LAB — CBC
HCT: 31 % — ABNORMAL LOW (ref 35.0–47.0)
HEMOGLOBIN: 10.3 g/dL — AB (ref 12.0–16.0)
MCH: 28.7 pg (ref 26.0–34.0)
MCHC: 33.2 g/dL (ref 32.0–36.0)
MCV: 86.3 fL (ref 80.0–100.0)
Platelets: 61 10*3/uL — ABNORMAL LOW (ref 150–440)
RBC: 3.59 MIL/uL — ABNORMAL LOW (ref 3.80–5.20)
RDW: 13.6 % (ref 11.5–14.5)
WBC: 0.6 10*3/uL — CL (ref 3.6–11.0)

## 2017-03-11 MED ORDER — ENSURE ENLIVE PO LIQD
237.0000 mL | Freq: Two times a day (BID) | ORAL | Status: DC
  Administered 2017-03-11 – 2017-03-13 (×4): 237 mL via ORAL

## 2017-03-11 NOTE — Progress Notes (Signed)
Initial Nutrition Assessment  DOCUMENTATION CODES:   Obesity unspecified  INTERVENTION:  Provide Ensure Enlive po BID, each supplement provides 350 kcal and 20 grams of protein. Patient prefers chocolate.  Reviewed "Making the Most of Each Bite" and "Taste and Smell Changes" handouts from the Academy of [employer] and Dietetics with patient and friend. Encouraged small, frequent meals (approximately 6-8 per day). Encouraged intake of calorie- and protein-dense foods. Reviewed strategies for taste changes.  NUTRITION DIAGNOSIS:   Inadequate oral intake related to poor appetite as evidenced by per patient/family report, 4 percent weight loss over 2 weeks.  GOAL:   Patient will meet greater than or equal to 90% of their needs  MONITOR:   PO intake, Supplement acceptance, Labs, Weight trends, I & O's  REASON FOR ASSESSMENT:   Malnutrition Screening Tool    ASSESSMENT:   71 year old female with PMHx of anxiety, depression, hypothyroidism, GERD, stage IB carcinoma of upper-outer quadrant of left breast on chemotherapy who presents with neutropenic fever.   -Portacath was placed 02/15/2017. Started chemotherapy 5/2. Patient s/p cycle 2 of 4 of Adriamycin and Cytoxan with Neulasta support. Per chart treatment plan will be lumpectomy or mastectomy and adjuvant XRT.  Met with patient and her friend Belenda Cruise at bedside. Patient reports her appetite has been poor since she started chemotherapy. She is having occasional nausea, but reports that is not typically the issue. She just doesn't want to eat, experiencing lack of hunger. Prior to chemotherapy she used to eat 3 good meals per day. Now can typically eat breakfast, but then only small snacks after that. For breakfast she tries to eat granola and two boiled eggs (typically can finish this). If she is able to eat a second meal it may be egg and sausage McMuffin with grape jelly from [employer]. Otherwise she will just have small snacks such  as peanut butter crackers. Joint pain from RA - difficult to prepare foods. Her friend is able to help her prepare meals. She has also purchased her some store-brand Ensure, but she has not started drinking these regularly.  Reports UBW 180 lbs and that she believes she has lost 10 lbs in one week. Per chart she was 183.9 lbs on 02/05/2017 and lost 7.3 lbs (4% body weight) over approximately 2 weeks (was 176.6 lbs on 02/24/2017), which is significant for time frame.  Medications reviewed and include: Neupogen, levothyroxine, NS @ 125 ml/hr, cefepime.  Labs reviewed: WBC 0.6, Platelets 61.  Nutrition-Focused physical exam completed. Findings are no fat depletion, no muscle depletion, and no edema.   Patient does not yet meet criteria for malnutrition, but is at risk for acute malnutrition due to significant weight loss of 4% over 2 weeks.  Discussed with RN.  Diet Order:  Diet regular Room service appropriate? Yes; Fluid consistency: Thin  Skin:  Reviewed, no issues  Last BM:  PTA (03/09/2017 per chart)  Height:   Ht Readings from Last 1 Encounters:  03/10/17 '5\' 4"'$  (1.626 m)    Weight:   Wt Readings from Last 1 Encounters:  03/10/17 178 lb 12.8 oz (81.1 kg)    Ideal Body Weight:  54.5 kg  BMI:  Body mass index is 30.69 kg/m.  Estimated Nutritional Needs:   Kcal:  1725-1990 (MSJ x 1.3-1.5)  Protein:  80-105 grams (1-1.3 grams/kg or approximately 1.5-2 grams/kg IBW)  Fluid:  1.7-1.9 L/day (30-35 ml/kg IBW)  EDUCATION NEEDS:   Education needs addressed  Willey Blade, MS, RD, LDN Pager: [phone]  After Hours Pager: [phone]

## 2017-03-11 NOTE — Progress Notes (Signed)
[place] [facility] [place] NAME: Samantha Valentine    MR#:  [id number]  DATE OF BIRTH:  1946-09-16  SUBJECTIVE:  CHIEF COMPLAINT:   Chief Complaint  Patient presents with  . Fever   Fever 99.7 this morning. The patient feels better. REVIEW OF SYSTEMS:  Review of Systems  Constitutional: Positive for fever and malaise/fatigue. Negative for chills.  HENT: Negative for nosebleeds.   Eyes: Negative for blurred vision and double vision.  Respiratory: Negative for cough, hemoptysis, shortness of breath, wheezing and stridor.   Cardiovascular: Negative for chest pain, palpitations and leg swelling.  Gastrointestinal: Negative for abdominal pain, blood in stool, constipation, diarrhea, melena, nausea and vomiting.  Genitourinary: Negative for dysuria, frequency and hematuria.  Musculoskeletal: Negative for back pain.  Skin: Negative for itching and rash.  Neurological: Positive for weakness. Negative for dizziness, focal weakness, loss of consciousness and headaches.  Psychiatric/Behavioral: Negative for depression. The patient is not nervous/anxious.     DRUG ALLERGIES:   Allergies  Allergen Reactions  . Hydroxychloroquine Sulfate     REACTION: rash  . Ibandronate Sodium     REACTION: rash  . Risedronate Sodium     REACTION: rash   VITALS:  Blood pressure 114/63, pulse 95, temperature 99.1 F (37.3 C), temperature source Oral, resp. rate 17, height 5\' 4"  (1.626 m), weight 178 lb 12.8 oz (81.1 kg), SpO2 96 %. PHYSICAL EXAMINATION:  Physical Exam  Constitutional: She is oriented to person, place, and time and well-developed, well-nourished, and in no distress.  HENT:  Head: Normocephalic.  Mouth/Throat: Oropharynx is clear and moist.  Eyes: Conjunctivae and EOM are normal. No scleral icterus.  Neck: Normal range of motion. Neck supple. No JVD present. No tracheal deviation present.  Cardiovascular: Normal rate, regular rhythm and normal heart  sounds.  Exam reveals no gallop.   No murmur heard. Pulmonary/Chest: Effort normal and breath sounds normal. No respiratory distress. She has no wheezes. She has no rales.  Abdominal: Soft. Bowel sounds are normal. She exhibits no distension. There is no tenderness.  Musculoskeletal: Normal range of motion. She exhibits no edema or tenderness.  Neurological: She is alert and oriented to person, place, and time.  Skin: No rash noted. No erythema.  Psychiatric: Affect and judgment normal.   LABORATORY PANEL:  Female CBC  Recent Labs Lab 03/11/17 0452  WBC 0.6*  HGB 10.3*  HCT 31.0*  PLT 61*   ------------------------------------------------------------------------------------------------------------------ Chemistries   Recent Labs Lab 03/10/17 1528 03/11/17 0452  NA 134* 136  K 3.9 3.7  CL 102 107  CO2 24 24  GLUCOSE 128* 103*  BUN 18 10  CREATININE 0.67 0.49  CALCIUM 8.7* 7.9*  AST 22  --   ALT 20  --   ALKPHOS 88  --   BILITOT 0.6  --    RADIOLOGY:  Dg Chest 2 View  Result Date: 03/10/2017 CLINICAL DATA:  Fever started today at noon.  Breast cancer. EXAM: CHEST  2 VIEW COMPARISON:  One-view chest x-ray a 02/15/2017 FINDINGS: The heart size is normal. A right subclavian Port-A-Cath is stable in position. There is no significant edema or effusion. Minimal atelectasis is present without significant airspace disease. The visualized soft tissues and bony thorax are unremarkable. IMPRESSION: 1. No acute cardiopulmonary disease or signal change. 2. Stable positioning of right subclavian Port-A-Cath. Electronically Signed   By: San Morelle M.D.   On: 03/10/2017 16:09   ASSESSMENT AND PLAN:  1.  Neutropenic fever with tachycardia. Still neutropenia. Continue Maxipime. Given 1 dose of vancomycin in the ER.  Continue Neupogen, follow-up CBC and blood culture, follow-up Dr. Grayland Ormond.  2. Breast cancer. Follow-up with Dr. Grayland Ormond as outpatient for management. Patient  wishes to be a DO NOT RESUSCITATE.  3. Hypothyroidism unspecified on levothyroxine 4. History of anxiety and depression on Xanax when necessary 5. History of rheumatoid arthritis. Hold Celebrex with blood in the urine 6. Hematuria with Large blood in the urine. Likely related to kidney stones. Patient the deferred a CT scan at this point. Give IV fluid hydration and continue to monitor.  Pancytopenia. Possible due to chemotherapy. Follow-up CBC and Dr. Grayland Ormond.  Generalized weakness. PT evaluation. All the records are reviewed and case discussed with Care Management/Social Worker. Management plans discussed with the patient, family and they are in agreement.  CODE STATUS: DNR  TOTAL TIME TAKING CARE OF THIS PATIENT: 38 minutes.   More than 50% of the time was spent in counseling/coordination of care: YES  POSSIBLE D/C IN 2-3  DAYS, DEPENDING ON CLINICAL CONDITION.   Demetrios Loll M.D on 03/11/2017 at 11:03 AM  Between 7am to 6pm - Pager - [phone]  After 6pm go to [email or web address] - [employer] [facility]  Office  [phone]  CC: Primary care physician; Tower, Wynelle Fanny, MD  Note: This dictation was prepared with Dragon dictation along with smaller phrase technology. Any transcriptional errors that result from this process are unintentional.

## 2017-03-11 NOTE — Care Management (Signed)
Admitted to this facility with the diagnosis of neutropenic fever. Lives alone. Friend is Belenda Cruise [phone]). Son is Shanon Brow [phone]). Last seen Dr. Glori Bickers March 2018. Prescriptions are filled at [facility] on [address]. Home Health per [facility] Go 15 years ago following knee surgery. No skilled facility. No home oxygen. Rolling walker, cane, bedside commode, and cane in the home. Takes care of all basic and instrumental activities of daily living herself, drives. Port placed May 1st 2018. Last fall was March 2018. Appetite getting better. Friend/family will transport. Shelbie Ammons RN MSN CCM Care Management [phone]

## 2017-03-11 NOTE — Consult Note (Signed)
[facility]  Date of admission:  03/10/2017  Inpatient day:  03/11/2017  Consulting physician:  Dr Loletha Grayer  Reason for Consultation:  Neutropenic fever  Chief Complaint: Samantha Valentine is a 71 y.o. female with stage IB left breast cancer who was admitted through the emergency room on day 8 s/p cycle #2 Adriamycin and Cytoxan (AC) with fever and neutropenia.  HPI:  The patient received cycle #2 AC with Neulasta support in the medical oncology clinic on 03/03/2017.  She tolerated her chemotherapy well.  She felt well until 03/10/2017 around midday when she felt cold and chilly. Temperature was 102.  She presented to the emergency room.  Counts included a white count of 400 with an ANC of 0. Platelets were 74,000. CXR was negative.  Cultures were drawn.  She was started on cefepime. She received 1 dose of vancomycin.  Symptomatically, she is feeling better. She notes some dark-colored urine. She has a history of kidney stones.  She denies any runny nose, sore throat, cough or dysuria. She has had no diarrhea. She had 1 episode of emesis the day before admission.   Past Medical History:  Diagnosis Date  . Arthritis    RA  . Breast mass 1 year   . Cancer ([place]) 01/29/2017   left breast INVASIVE MAMMARY CARCINOMA, ER/PR positive  . Collagen vascular disease (HCC)    Rhematoid Arthritis  . DDD (degenerative disc disease)    in neck  . Depression   . Fibromyalgia   . GERD (gastroesophageal reflux disease)    NO MEDS  . History of kidney stones    MULTIPLE KIDNEY STONES BIL  . Hypothyroidism   . Migraine   . Osteoporosis     Past Surgical History:  Procedure Laterality Date  . BREAST BIOPSY Left 01/29/2017   US guided biopsy INVASIVE MAMMARY CARCINOMA  . JOINT REPLACEMENT  2003   total hip replacement  . LITHOTRIPSY  1997   kidney stone  . PORTACATH PLACEMENT Right 02/15/2017   Procedure: INSERTION PORT-A-CATH;  Surgeon: Robert Bellow, MD;   Location: [facility] ORS;  Service: General;  Laterality: Right;  . TONSILLECTOMY  1970    Family History  Problem Relation Age of Onset  . Alcohol abuse Father   . Diabetes Father   . Stroke Father   . Hypertension Mother   . Endometrial cancer Mother   . Osteoarthritis Mother   . Breast cancer Paternal Aunt 39  . Liver disease Sister   . Breast cancer Maternal Aunt   . Rheum arthritis Maternal Grandmother   . Diabetes Paternal Grandmother   . Parkinson's disease Paternal Grandfather     Social History:  reports that she quit smoking about 33 years ago. Her smoking use included Cigarettes. She has a 37.50 pack-year smoking history. She has never used smokeless tobacco. She reports that she does not drink alcohol or use drugs.  The patient is accompanied by her son, Shanon Brow.  Allergies:  Allergies  Allergen Reactions  . Hydroxychloroquine Sulfate     REACTION: rash  . Ibandronate Sodium     REACTION: rash  . Risedronate Sodium     REACTION: rash    Medications Prior to Admission  Medication Sig Dispense Refill  . ALPRAZolam (XANAX) 0.25 MG tablet Take 1 tablet (0.25 mg total) by mouth 2 (two) times daily as needed for anxiety. 60 tablet 0  . CELEBREX 200 MG capsule Take 200 mg by mouth 2 (two) times daily.     Marland Kitchen  levothyroxine (SYNTHROID, LEVOTHROID) 137 MCG tablet Take 1 tablet (137 mcg total) by mouth daily before breakfast. 30 tablet 11  . lidocaine-prilocaine (EMLA) cream Apply to affected area once 30 g 3  . ondansetron (ZOFRAN) 8 MG tablet Take 1 tablet (8 mg total) by mouth 2 (two) times daily as needed. 60 tablet 2  . prochlorperazine (COMPAZINE) 10 MG tablet Take 1 tablet (10 mg total) by mouth every 6 (six) hours as needed (Nausea or vomiting). 60 tablet 2    Review of Systems: GENERAL:  Feels better.  Chills, resolved.  Fever, improved. No sweats or weight loss. PERFORMANCE STATUS (ECOG):  1 HEENT:  No visual changes, runny nose, sore throat, mouth sores or  tenderness. Lungs: No shortness of breath or cough.  No hemoptysis. Cardiac:  No chest pain, palpitations, orthopnea, or PND. GI:  No nausea, vomiting, diarrhea, constipation, melena or hematochezia. GU:  Dark colored urine.  h/o nephrolithiasis.  No urgency, frequency, dysuria, or hematuria. Musculoskeletal:  No back pain.  Joint pain secondary to RA.  No muscle tenderness. Extremities:  No pain or swelling. Skin:  No rashes or skin changes. Neuro:  No headache, numbness or weakness, balance or coordination issues. Endocrine:  No diabetes.  Thyroid disease on Synthroid.  No hot flashes or night sweats. Psych:  No mood changes, depression or anxiety. Pain:  No focal pain. Review of systems:  All other systems reviewed and found to be negative.  Physical Exam:  Blood pressure 130/70, pulse (!) 102, temperature 99.8 F (37.7 C), temperature source Oral, resp. rate 20, height '5\' 4"'  (1.626 m), weight 178 lb 12.8 oz (81.1 kg), SpO2 98 %.  GENERAL:  Well developed, well nourished, woman sitting comfortably on the medical unit in no acute distress. MENTAL STATUS:  Alert and oriented to person, place and time. HEAD:  Wearing a blue scarf.  Normocephalic, atraumatic, face symmetric, no Cushingoid features. EYES:  Blue eyes.  Pupils equal round and reactive to light and accomodation.  No conjunctivitis or scleral icterus. ENT:  Oropharynx clear without lesion.  Tongue normal. Mucous membranes moist.  RESPIRATORY:  Clear to auscultation without rales, wheezes or rhonchi. CARDIOVASCULAR:  Regular rate and rhythm without murmur, rub or gallop. CHEST WALL:  Port-a-cath unremarkable. ABDOMEN:  Soft, non-tender, with active bowel sounds, and no hepatosplenomegaly.  No masses. SKIN:  No rashes, ulcers or lesions. EXTREMITIES: No edema, no skin discoloration or tenderness.  No palpable cords. LYMPH NODES: No palpable cervical, supraclavicular, axillary or inguinal adenopathy  NEUROLOGICAL:  Unremarkable. PSYCH:  Appropriate.   Results for orders placed or performed during the hospital encounter of 03/10/17 (from the past 48 hour(s))  Comprehensive metabolic panel     Status: Abnormal   Collection Time: 03/10/17  3:28 PM  Result Value Ref Range   Sodium 134 (L) 135 - 145 mmol/L   Potassium 3.9 3.5 - 5.1 mmol/L   Chloride 102 101 - 111 mmol/L   CO2 24 22 - 32 mmol/L   Glucose, Bld 128 (H) 65 - 99 mg/dL   BUN 18 6 - 20 mg/dL   Creatinine, Ser 0.67 0.44 - 1.00 mg/dL   Calcium 8.7 (L) 8.9 - 10.3 mg/dL   Total Protein 6.3 (L) 6.5 - 8.1 g/dL   Albumin 3.1 (L) 3.5 - 5.0 g/dL   AST 22 15 - 41 U/L   ALT 20 14 - 54 U/L   Alkaline Phosphatase 88 38 - 126 U/L   Total Bilirubin 0.6 0.3 - 1.2  mg/dL   GFR calc non Af Amer >60 >60 mL/min   GFR calc Af Amer >60 >60 mL/min    Comment: (NOTE) The eGFR has been calculated using the CKD EPI equation. This calculation has not been validated in all clinical situations. eGFR's persistently <60 mL/min signify possible Chronic Kidney Disease.    Anion gap 8 5 - 15  CBC WITH DIFFERENTIAL     Status: Abnormal   Collection Time: 03/10/17  3:28 PM  Result Value Ref Range   WBC 0.4 (LL) 3.6 - 11.0 K/uL    Comment: CRITICAL RESULT CALLED TO, READ BACK BY AND VERIFIED WITH: KIM MAIN AT 1603 ON 03/10/2017 JJB    RBC 3.95 3.80 - 5.20 MIL/uL   Hemoglobin 11.7 (L) 12.0 - 16.0 g/dL   HCT 34.4 (L) 35.0 - 47.0 %   MCV 87.0 80.0 - 100.0 fL   MCH 29.7 26.0 - 34.0 pg   MCHC 34.1 32.0 - 36.0 g/dL   RDW 13.9 11.5 - 14.5 %   Platelets 74 (L) 150 - 440 K/uL   Neutrophils Relative % 2 %   Lymphocytes Relative 81 %   Monocytes Relative 15 %   Eosinophils Relative 2 %   Basophils Relative 0 %   Neutro Abs 0.0 (L) 1.4 - 6.5 K/uL   Lymphs Abs 0.3 (L) 1.0 - 3.6 K/uL   Monocytes Absolute 0.1 (L) 0.2 - 0.9 K/uL   Eosinophils Absolute 0.0 0 - 0.7 K/uL   Basophils Absolute 0.0 0 - 0.1 K/uL  Blood Culture (routine x 2)     Status: None (Preliminary result)    Collection Time: 03/10/17  3:28 PM  Result Value Ref Range   Specimen Description BLOOD PORT    Special Requests Blood Culture adequate volume    Culture NO GROWTH < 24 HOURS    Report Status PENDING   Blood Culture (routine x 2)     Status: None (Preliminary result)   Collection Time: 03/10/17  3:28 PM  Result Value Ref Range   Specimen Description BLOOD RIGHT AC    Special Requests      Blood Culture results may not be optimal due to an excessive volume of blood received in culture bottles   Culture NO GROWTH < 24 HOURS    Report Status PENDING   Urinalysis, Routine w reflex microscopic     Status: Abnormal   Collection Time: 03/10/17  3:28 PM  Result Value Ref Range   Color, Urine YELLOW (A) YELLOW   APPearance HAZY (A) CLEAR   Specific Gravity, Urine 1.020 1.005 - 1.030   pH 5.0 5.0 - 8.0   Glucose, UA NEGATIVE NEGATIVE mg/dL   Hgb urine dipstick LARGE (A) NEGATIVE   Bilirubin Urine NEGATIVE NEGATIVE   Ketones, ur 5 (A) NEGATIVE mg/dL   Protein, ur 100 (A) NEGATIVE mg/dL   Nitrite NEGATIVE NEGATIVE   Leukocytes, UA NEGATIVE NEGATIVE   RBC / HPF TOO NUMEROUS TO COUNT 0 - 5 RBC/hpf   WBC, UA 0-5 0 - 5 WBC/hpf   Bacteria, UA NONE SEEN NONE SEEN   Squamous Epithelial / LPF 6-30 (A) NONE SEEN   Mucous PRESENT   Lactic acid, plasma     Status: None   Collection Time: 03/10/17  3:28 PM  Result Value Ref Range   Lactic Acid, Venous 1.8 0.5 - 1.9 mmol/L  Troponin I     Status: Abnormal   Collection Time: 03/10/17  3:28 PM  Result Value Ref  Range   Troponin I 0.05 (HH) <0.03 ng/mL    Comment: CRITICAL RESULT CALLED TO, READ BACK BY AND VERIFIED WITH KIM MAIN AT 1610 ON 03/10/17 BY SNJ   Basic metabolic panel     Status: Abnormal   Collection Time: 03/11/17  4:52 AM  Result Value Ref Range   Sodium 136 135 - 145 mmol/L   Potassium 3.7 3.5 - 5.1 mmol/L   Chloride 107 101 - 111 mmol/L   CO2 24 22 - 32 mmol/L   Glucose, Bld 103 (H) 65 - 99 mg/dL   BUN 10 6 - 20 mg/dL    Creatinine, Ser 0.49 0.44 - 1.00 mg/dL   Calcium 7.9 (L) 8.9 - 10.3 mg/dL   GFR calc non Af Amer >60 >60 mL/min   GFR calc Af Amer >60 >60 mL/min    Comment: (NOTE) The eGFR has been calculated using the CKD EPI equation. This calculation has not been validated in all clinical situations. eGFR's persistently <60 mL/min signify possible Chronic Kidney Disease.    Anion gap 5 5 - 15  CBC     Status: Abnormal   Collection Time: 03/11/17  4:52 AM  Result Value Ref Range   WBC 0.6 (LL) 3.6 - 11.0 K/uL    Comment: RESULT REPEATED AND VERIFIED CRITICAL VALUE NOTED.  VALUE IS CONSISTENT WITH PREVIOUSLY REPORTED AND CALLED VALUE.    RBC 3.59 (L) 3.80 - 5.20 MIL/uL   Hemoglobin 10.3 (L) 12.0 - 16.0 g/dL   HCT 31.0 (L) 35.0 - 47.0 %   MCV 86.3 80.0 - 100.0 fL   MCH 28.7 26.0 - 34.0 pg   MCHC 33.2 32.0 - 36.0 g/dL   RDW 13.6 11.5 - 14.5 %   Platelets 61 (L) 150 - 440 K/uL   Dg Chest 2 View  Result Date: 03/10/2017 CLINICAL DATA:  Fever started today at noon.  Breast cancer. EXAM: CHEST  2 VIEW COMPARISON:  One-view chest x-ray a 02/15/2017 FINDINGS: The heart size is normal. A right subclavian Port-A-Cath is stable in position. There is no significant edema or effusion. Minimal atelectasis is present without significant airspace disease. The visualized soft tissues and bony thorax are unremarkable. IMPRESSION: 1. No acute cardiopulmonary disease or signal change. 2. Stable positioning of right subclavian Port-A-Cath. Electronically Signed   By: San Morelle M.D.   On: 03/10/2017 16:09    Assessment:  The patient is a 71 y.o. woman with stage IB left breast cancer admitted on day 8 of cycle #2 Adriamycin and Cytoxan (AC) with fever and neutropenia.  There is no obvious source of fever.  CXR is negative.  Cultures are negative to date.  She is on Cefepime.  Fever curve improving.  Plan:   1.  Oncology:  Day 9 of cycle #2 Adriamycin and Cytoxan [facility]) for breast cancer.  Patient received  Neulasta post chemotherapy.  Discontinue daily GCSF.  Anticipate counts to recover quickly.  Follow counts daily.  Cycle #3 AC planned for 03/17/2017.  2.  Hematology:  Neutropenia and thrombocytopenia secondary to chemotherapy.  Anticipate count recovery in next 1-2 days.  Continue neutropenic precautions.  3.  Infectious disease:  No source of infection.  Cultures negative to-date.  Continue Cefepime.  Blood cultures q 24 hours prn temp > 100.4.  Thank you for allowing me to participate in GRIZELDA PISCOPO 's care.  I will follow her closely with you while hospitalized.   Lequita Asal, MD  03/11/2017, 9:04 PM

## 2017-03-12 DIAGNOSIS — R000 Tachycardia, unspecified: Secondary | ICD-10-CM

## 2017-03-12 LAB — CBC WITH DIFFERENTIAL/PLATELET
Basophils Absolute: 0.1 10*3/uL (ref 0–0.1)
Basophils Relative: 2 %
Eosinophils Absolute: 0 10*3/uL (ref 0–0.7)
Eosinophils Relative: 1 %
HCT: 28.5 % — ABNORMAL LOW (ref 35.0–47.0)
Hemoglobin: 9.5 g/dL — ABNORMAL LOW (ref 12.0–16.0)
Lymphocytes Relative: 18 %
Lymphs Abs: 0.5 10*3/uL — ABNORMAL LOW (ref 1.0–3.6)
MCH: 28.8 pg (ref 26.0–34.0)
MCHC: 33.3 g/dL (ref 32.0–36.0)
MCV: 86.4 fL (ref 80.0–100.0)
Monocytes Absolute: 0.2 10*3/uL (ref 0.2–0.9)
Monocytes Relative: 8 %
Neutro Abs: 2 10*3/uL (ref 1.4–6.5)
Neutrophils Relative %: 71 %
Platelets: 76 10*3/uL — ABNORMAL LOW (ref 150–440)
RBC: 3.3 MIL/uL — ABNORMAL LOW (ref 3.80–5.20)
RDW: 13.5 % (ref 11.5–14.5)
WBC: 2.8 10*3/uL — ABNORMAL LOW (ref 3.6–11.0)

## 2017-03-12 MED ORDER — BISACODYL 5 MG PO TBEC: 5.0000 mg | DELAYED_RELEASE_TABLET | Freq: Every day | ORAL | Status: DC | PRN

## 2017-03-12 MED ORDER — DOCUSATE SODIUM 100 MG PO CAPS
100.0000 mg | ORAL_CAPSULE | Freq: Two times a day (BID) | ORAL | Status: DC
  Administered 2017-03-12 – 2017-03-13 (×2): 100 mg via ORAL
  Filled 2017-03-12 (×3): qty 1

## 2017-03-12 MED ORDER — OXYCODONE-ACETAMINOPHEN 5-325 MG PO TABS
1.0000 | ORAL_TABLET | Freq: Four times a day (QID) | ORAL | Status: DC | PRN
  Administered 2017-03-12 (×2): 1 via ORAL
  Filled 2017-03-12 (×3): qty 1

## 2017-03-12 MED ORDER — METOPROLOL TARTRATE 5 MG/5ML IV SOLN
2.5000 mg | Freq: Once | INTRAVENOUS | Status: DC
  Filled 2017-03-12: qty 5

## 2017-03-12 NOTE — Progress Notes (Signed)
[place] [facility] [place] NAME: Samantha Valentine    MR#:  [id number]  DATE OF BIRTH:  February 11, 1946  SUBJECTIVE:  CHIEF COMPLAINT:   Chief Complaint  Patient presents with  . Fever   The patient feels better. Constipation. HR is better but up to 139 this pm. REVIEW OF SYSTEMS:  Review of Systems  Constitutional: Negative for chills, fever and malaise/fatigue.  HENT: Negative for nosebleeds.   Eyes: Negative for blurred vision and double vision.  Respiratory: Negative for cough, hemoptysis, shortness of breath, wheezing and stridor.   Cardiovascular: Negative for chest pain, palpitations and leg swelling.  Gastrointestinal: Positive for constipation. Negative for abdominal pain, blood in stool, diarrhea, melena, nausea and vomiting.  Genitourinary: Negative for dysuria, frequency and hematuria.  Musculoskeletal: Negative for back pain.  Skin: Negative for itching and rash.  Neurological: Negative for dizziness, focal weakness, loss of consciousness, weakness and headaches.  Psychiatric/Behavioral: Negative for depression. The patient is not nervous/anxious.     DRUG ALLERGIES:   Allergies  Allergen Reactions  . Hydroxychloroquine Sulfate     REACTION: rash  . Ibandronate Sodium     REACTION: rash  . Risedronate Sodium     REACTION: rash   VITALS:  Blood pressure 107/64, pulse 91, temperature 98 F (36.7 C), temperature source Oral, resp. rate 16, height 5\' 4"  (1.626 m), weight 178 lb 12.8 oz (81.1 kg), SpO2 97 %. PHYSICAL EXAMINATION:  Physical Exam  Constitutional: She is oriented to person, place, and time and well-developed, well-nourished, and in no distress.  HENT:  Head: Normocephalic.  Mouth/Throat: Oropharynx is clear and moist.  Eyes: Conjunctivae and EOM are normal. No scleral icterus.  Neck: Normal range of motion. Neck supple. No JVD present. No tracheal deviation present.  Cardiovascular: Normal rate, regular rhythm and  normal heart sounds.  Exam reveals no gallop.   No murmur heard. Pulmonary/Chest: Effort normal and breath sounds normal. No respiratory distress. She has no wheezes. She has no rales.  Abdominal: Soft. Bowel sounds are normal. She exhibits no distension. There is no tenderness.  Musculoskeletal: Normal range of motion. She exhibits no edema or tenderness.  Neurological: She is alert and oriented to person, place, and time.  Skin: No rash noted. No erythema.  Psychiatric: Affect and judgment normal.   LABORATORY PANEL:  Female CBC  Recent Labs Lab 03/12/17 0555  WBC 2.8*  HGB 9.5*  HCT 28.5*  PLT 76*   ------------------------------------------------------------------------------------------------------------------ Chemistries   Recent Labs Lab 03/10/17 1528 03/11/17 0452  NA 134* 136  K 3.9 3.7  CL 102 107  CO2 24 24  GLUCOSE 128* 103*  BUN 18 10  CREATININE 0.67 0.49  CALCIUM 8.7* 7.9*  AST 22  --   ALT 20  --   ALKPHOS 88  --   BILITOT 0.6  --    RADIOLOGY:  No results found. ASSESSMENT AND PLAN:   1.  Neutropenic fever with tachycardia. Still neutropenia. Continue Maxipime. Given 1 dose of vancomycin in the ER.  Continue Neupogen, follow-up CBC and blood culture. Per Dr. Mike Gip, the patient may be discharged home tomorrow if the culture is still negative.  2. Breast cancer. Follow-up with Dr. Grayland Ormond as outpatient for management. Patient wishes to be a DO NOT RESUSCITATE.  3. Hypothyroidism unspecified on levothyroxine 4. History of anxiety and depression on Xanax when necessary 5. History of rheumatoid arthritis. Hold Celebrex with blood in the urine 6. Hematuria with Large  blood in the urine. Likely related to kidney stones. Patient the deferred a CT scan at this point. Given IV fluid hydration.  Pancytopenia. Possible due to chemotherapy. Follow-up CBC and Dr. Grayland Ormond.  I discussed with Dr. Mike Gip. All the records are reviewed and case discussed  with Care Management/Social Worker. Management plans discussed with the patient, family and they are in agreement.  CODE STATUS: DNR  TOTAL TIME TAKING CARE OF THIS PATIENT: 33 minutes.   More than 50% of the time was spent in counseling/coordination of care: YES  POSSIBLE D/C IN 2-3  DAYS, DEPENDING ON CLINICAL CONDITION.   Demetrios Loll M.D on 03/12/2017 at 2:44 PM  Between 7am to 6pm - Pager - [phone]  After 6pm go to [email or web address] - [employer] [facility]  Office  [phone]  CC: Primary care physician; Tower, Wynelle Fanny, MD  Note: This dictation was prepared with Dragon dictation along with smaller phrase technology. Any transcriptional errors that result from this process are unintentional.

## 2017-03-12 NOTE — Progress Notes (Signed)
  Oncology Nurse Navigator Documentation  Navigator Location: [facility] (03/12/17 1100)   )Navigator Encounter Type: Telephone (inpatient) (03/12/17 1100) Telephone: Outgoing Call (03/12/17 1100)                   Patient Visit Type: Follow-up;Inpatient (03/12/17 1100) Treatment Phase: Active Tx (03/12/17 1100)   Education: Pain/ Symptom Management (03/12/17 1100) Interventions: Coordination of Care (03/12/17 1100)   Coordination of Care: Appts (03/12/17 1100)                  Time Spent with Patient: 45 (03/12/17 1100)  Visited in hospital yesterday, and spoke to her by phone today.  Neutropenic fever.  Hopes to be discharged today.  Feels she will be able to control RA pain better at home, where she can move around more comfortably.  States she received message to reschedule her lab/MD/chemo appointment which was scheduled for 03/17/17.  Reminded her [facility] will be closed for holiday on 03/15/17.  States she will call scheduler when she gets home.

## 2017-03-12 NOTE — Progress Notes (Signed)
Patient refused metoprolol administration at this time. Patient report being in pain and patient states she thinks her pain is causing the increase in heart rate. Patient requested a "low dose of percocet". Spoke with Dr. Estanislado Pandy and verbal order with read back for percocet was given.

## 2017-03-12 NOTE — Progress Notes (Signed)
Notified Dr Ara Kussmaul that HR is staying at 135.  Pt is currently sleeping deeply and I did not wake her.  Await new orders. Dorna Bloom RN

## 2017-03-12 NOTE — Progress Notes (Signed)
Pharmacy Antibiotic Note  Samantha Valentine is a 71 y.o. female admitted on 03/10/2017 with neutropenic fever.  Pharmacy has been consulted for cefepime dosing.  Plan: Continue Cefepime 2g IV Q8hr   Height: 5\' 4"  (162.6 cm) Weight: 178 lb 12.8 oz (81.1 kg) IBW/kg (Calculated) : 54.7  Temp (24hrs), Avg:99.2 F (37.3 C), Min:98.1 F (36.7 C), Max:100.5 F (38.1 C)   Recent Labs Lab 03/10/17 1528 03/11/17 0452 03/12/17 0555  WBC 0.4* 0.6* 2.8*  CREATININE 0.67 0.49  --   LATICACIDVEN 1.8  --   --     Estimated Creatinine Clearance: 68.4 mL/min (by C-G formula based on SCr of 0.49 mg/dL).    Allergies  Allergen Reactions  . Hydroxychloroquine Sulfate     REACTION: rash  . Ibandronate Sodium     REACTION: rash  . Risedronate Sodium     REACTION: rash    Antimicrobials this admission: Cefepime 5/23 >> Vancomycin x1  Microbiology results: 5/23 BCx: in process  Thank you for allowing pharmacy to be a part of this patient's care.  Larene Beach, PharmD 03/12/2017 9:59 AM

## 2017-03-12 NOTE — Progress Notes (Signed)
[facility] Hematology/Oncology Progress Note  Date of admission: 03/10/2017  Hospital day:  03/12/2017  Chief Complaint: Samantha Valentine is a 71 y.o. female with stage IB left breast cancer who was admitted through the emergency room on day 8 s/p cycle #2 Adriamycin and Cytoxan (AC) with fever and neutropenia.  Subjective: Feels good.  Arthritis pain last night that caused tachycardia.  Social History: The patient is accompanied by her roommate today.  Allergies:  Allergies  Allergen Reactions  . Hydroxychloroquine Sulfate     REACTION: rash  . Ibandronate Sodium     REACTION: rash  . Risedronate Sodium     REACTION: rash    Scheduled Medications: . docusate sodium  100 mg Oral BID  . feeding supplement (ENSURE ENLIVE)  237 mL Oral BID BM  . levothyroxine  137 mcg Oral QAC breakfast  . metoprolol tartrate  2.5 mg Intravenous Once  . sodium chloride flush  10-40 mL Intracatheter Q12H    Review of Systems: GENERAL:  Feels better.  Fever, improved. No sweats or weight loss. PERFORMANCE STATUS (ECOG):  1 HEENT:  No visual changes, runny nose, sore throat, mouth sores or tenderness. Lungs: No shortness of breath or cough.  No hemoptysis. Cardiac:  No chest pain, palpitations, orthopnea, or PND. GI:  No nausea, vomiting, diarrhea, constipation, melena or hematochezia. GU:  h/o nephrolithiasis.  No urgency, frequency, dysuria, or hematuria. Musculoskeletal:  No back pain.  Joint pain secondary to RA.  No muscle tenderness. Extremities:  No pain or swelling. Skin:  No rashes or skin changes. Neuro:  No headache, numbness or weakness, balance or coordination issues. Endocrine:  No diabetes.  Thyroid disease on Synthroid.  No hot flashes or night sweats. Psych:  No mood changes, depression or anxiety. Pain:  No focal pain. Review of systems:  All other systems reviewed and found to be negative.  Physical Exam: Blood pressure 119/72, pulse 89, temperature 98.1  F (36.7 C), temperature source Oral, resp. rate 16, height _0  (1.626 m), weight 178 lb 12.8 oz (81.1 kg), SpO2 98 %.  GENERAL:  Well developed, well nourished, woman sitting comfortably on the medical unit in no acute distress. MENTAL STATUS:  Alert and oriented to person, place and time. HEAD:  Wearing a scarf.  Normocephalic, atraumatic, face symmetric, no Cushingoid features. EYES:  Blue eyes.  Pupils equal round and reactive to light and accomodation.  No conjunctivitis or scleral icterus. ENT:  Oropharynx clear without lesion.  Tongue normal. Mucous membranes moist.  RESPIRATORY:  Clear to auscultation without rales, wheezes or rhonchi. CARDIOVASCULAR:  Regular rate and rhythm without murmur, rub or gallop. CHEST WALL:  Port-a-cath unremarkable. ABDOMEN:  Soft, non-tender, with active bowel sounds, and no hepatosplenomegaly.  No masses. SKIN:  No rashes, ulcers or lesions. EXTREMITIES: No edema, no skin discoloration or tenderness.  No palpable cords. LYMPH NODES: No palpable cervical, supraclavicular, axillary or inguinal adenopathy  NEUROLOGICAL: Unremarkable. PSYCH:  Appropriate.   Results for orders placed or performed during the hospital encounter of 03/10/17 (from the past 48 hour(s))  Basic metabolic panel     Status: Abnormal   Collection Time: 03/11/17  4:52 AM  Result Value Ref Range   Sodium 136 135 - 145 mmol/L   Potassium 3.7 3.5 - 5.1 mmol/L   Chloride 107 101 - 111 mmol/L   CO2 24 22 - 32 mmol/L   Glucose, Bld 103 (H) 65 - 99 mg/dL   BUN 10 6 -  20 mg/dL   Creatinine, Ser 0.49 0.44 - 1.00 mg/dL   Calcium 7.9 (L) 8.9 - 10.3 mg/dL   GFR calc non Af Amer >60 >60 mL/min   GFR calc Af Amer >60 >60 mL/min    Comment: (NOTE) The eGFR has been calculated using the CKD EPI equation. This calculation has not been validated in all clinical situations. eGFR's persistently <60 mL/min signify possible Chronic Kidney Disease.    Anion gap 5 5 - 15  CBC     Status:  Abnormal   Collection Time: 03/11/17  4:52 AM  Result Value Ref Range   WBC 0.6 (LL) 3.6 - 11.0 K/uL    Comment: RESULT REPEATED AND VERIFIED CRITICAL VALUE NOTED.  VALUE IS CONSISTENT WITH PREVIOUSLY REPORTED AND CALLED VALUE.    RBC 3.59 (L) 3.80 - 5.20 MIL/uL   Hemoglobin 10.3 (L) 12.0 - 16.0 g/dL   HCT 31.0 (L) 35.0 - 47.0 %   MCV 86.3 80.0 - 100.0 fL   MCH 28.7 26.0 - 34.0 pg   MCHC 33.2 32.0 - 36.0 g/dL   RDW 13.6 11.5 - 14.5 %   Platelets 61 (L) 150 - 440 K/uL  CBC with Differential     Status: Abnormal   Collection Time: 03/12/17  5:55 AM  Result Value Ref Range   WBC 2.8 (L) 3.6 - 11.0 K/uL   RBC 3.30 (L) 3.80 - 5.20 MIL/uL   Hemoglobin 9.5 (L) 12.0 - 16.0 g/dL   HCT 28.5 (L) 35.0 - 47.0 %   MCV 86.4 80.0 - 100.0 fL   MCH 28.8 26.0 - 34.0 pg   MCHC 33.3 32.0 - 36.0 g/dL   RDW 13.5 11.5 - 14.5 %   Platelets 76 (L) 150 - 440 K/uL   Neutrophils Relative % 71 %   Lymphocytes Relative 18 %   Monocytes Relative 8 %   Eosinophils Relative 1 %   Basophils Relative 2 %   Neutro Abs 2.0 1.4 - 6.5 K/uL   Lymphs Abs 0.5 (L) 1.0 - 3.6 K/uL   Monocytes Absolute 0.2 0.2 - 0.9 K/uL   Eosinophils Absolute 0.0 0 - 0.7 K/uL   Basophils Absolute 0.1 0 - 0.1 K/uL   Smear Review MORPHOLOGY UNREMARKABLE    No results found.  Assessment:  Samantha Valentine is a 71 y.o. female with stage IB left breast cancer admitted on day 8 of cycle #2 Adriamycin and Cytoxan (AC) with fever and neutropenia.  There is no obvious source of fever.  CXR is negative.  Cultures are negative to date.  She is on Cefepime.  Fever curve improving.  Plan:   1.  Oncology:  Day 10 of cycle #2 Adriamycin and Cytoxan [facility]) for breast cancer.  Patient received Neulasta post chemotherapy.  Daily GCSF discontinued.  Counts improved today.  Cycle #3 AC planned for 03/17/2017.  2.  Hematology:  Neutropenia and thrombocytopenia secondary to chemotherapy.  No longer neutropenic.  [place] [address].  Platelet count improving.  3.   Infectious disease:  No source of infection.  Cultures negative to-date.  Last fever at 12:47 am today.  If afebrile for 24 hours and cultures negative, patient can be discharged tomorrow.  If cultures remain negative, patient does not need to go home on antibiotics.  4.  Disposition:  Anticipate discharge home tomorrow.  Follow-up in the oncology clinic next week.   Lequita Asal, MD  03/12/2017, 7:50 PM

## 2017-03-12 NOTE — Progress Notes (Signed)
PT Cancellation Note  Patient Details Name: Samantha Valentine MRN: [id number] DOB: 1945/12/24   Cancelled Treatment:    Reason Eval/Treat Not Completed: Other (comment). Consult received and chart reviewed. Pt supine in bed and reports fatigue secondary to chronic RA pain. Pt reports she does not require PT at this time, as she is independent with her [facility] in room, and has been getting up with RN staff. She wishes to be taken off the PT list at this time. Reviewed home layout and fall prevention at home. Will complete current order. If needs change, please re-consult.   Gonzalo Waymire 03/12/2017, 2:52 PM  Greggory Stallion, PT, DPT [phone]

## 2017-03-13 LAB — CBC WITH DIFFERENTIAL/PLATELET
Band Neutrophils: 1 %
Basophils Absolute: 0 10*3/uL (ref 0–0.1)
Basophils Relative: 0 %
Blasts: 0 %
Eosinophils Absolute: 0.1 10*3/uL (ref 0–0.7)
Eosinophils Relative: 2 %
HCT: 31.7 % — ABNORMAL LOW (ref 35.0–47.0)
Hemoglobin: 10.8 g/dL — ABNORMAL LOW (ref 12.0–16.0)
Lymphocytes Relative: 16 %
Lymphs Abs: 0.9 10*3/uL — ABNORMAL LOW (ref 1.0–3.6)
MCH: 29.7 pg (ref 26.0–34.0)
MCHC: 34 g/dL (ref 32.0–36.0)
MCV: 87.3 fL (ref 80.0–100.0)
Metamyelocytes Relative: 0 %
Monocytes Absolute: 0.6 10*3/uL (ref 0.2–0.9)
Monocytes Relative: 11 %
Myelocytes: 0 %
Neutro Abs: 4 10*3/uL (ref 1.4–6.5)
Neutrophils Relative %: 70 %
Other: 0 %
Platelets: 122 10*3/uL — ABNORMAL LOW (ref 150–440)
Promyelocytes Absolute: 0 %
RBC: 3.64 MIL/uL — ABNORMAL LOW (ref 3.80–5.20)
RDW: 13.9 % (ref 11.5–14.5)
WBC: 5.6 10*3/uL (ref 3.6–11.0)
nRBC: 0 /100 WBC

## 2017-03-13 MED ORDER — CELECOXIB 200 MG PO CAPS
200.0000 mg | ORAL_CAPSULE | Freq: Two times a day (BID) | ORAL | Status: DC
  Administered 2017-03-13: 11:00:00 200 mg via ORAL
  Filled 2017-03-13 (×3): qty 1

## 2017-03-13 MED ORDER — HEPARIN SOD (PORK) LOCK FLUSH 100 UNIT/ML IV SOLN
500.0000 [IU] | Freq: Once | INTRAVENOUS | Status: AC
  Administered 2017-03-13: 13:00:00 500 [IU] via INTRAVENOUS
  Filled 2017-03-13: qty 5

## 2017-03-13 NOTE — Progress Notes (Signed)
Pt is being discharged today, port was flushed and  deaccessed. Discharge instructions were reviewed with the patient, she verified understanding. All belongings were packed and returned to the patient, home medications were retrieved from the pharmacy and returned to the patient. She was rolled out in a wheelchair by staff.

## 2017-03-13 NOTE — Discharge Instructions (Signed)
Regular diet

## 2017-03-13 NOTE — Discharge Summary (Signed)
[place] [facility] [place] NAME: Samantha Valentine    MR#:  [id number]  DATE OF BIRTH:  Sep 20, 1946  DATE OF ADMISSION:  03/10/2017   ADMITTING PHYSICIAN: Loletha Grayer, MD  DATE OF DISCHARGE: 03/13/2017 12:45 PM  PRIMARY CARE PHYSICIAN: Tower, Wynelle Fanny, MD   ADMISSION DIAGNOSIS:  Neutropenic fever ([place]) [D70.9, R50.81] DISCHARGE DIAGNOSIS:  Active Problems:   Neutropenic fever ([place])  SECONDARY DIAGNOSIS:   Past Medical History:  Diagnosis Date  . Arthritis    RA  . Breast mass 1 year   . Cancer ([place]) 01/29/2017   left breast INVASIVE MAMMARY CARCINOMA, ER/PR positive  . Collagen vascular disease (HCC)    Rhematoid Arthritis  . DDD (degenerative disc disease)    in neck  . Depression   . Fibromyalgia   . GERD (gastroesophageal reflux disease)    NO MEDS  . History of kidney stones    MULTIPLE KIDNEY STONES BIL  . Hypothyroidism   . Migraine   . Osteoporosis    HOSPITAL COURSE:   1.  Neutropenic fever with tachycardia. Improved. She has been on Maxipime. Given 1 dose of vancomycin in the ER.   Neupogen was discontinued due to improved WBC. Negative blood culture 3 days. Per Dr. Mike Gip, the patient may be discharged home if the culture is still negative.  2. Breast cancer. Follow-up with Dr. Grayland Ormond as outpatient for management. Patient wishes to be a DO NOT RESUSCITATE.  3. Hypothyroidism unspecified on levothyroxine 4. History of anxiety and depression on Xanax when necessary 5. History of rheumatoid arthritis. Hold Celebrex with blood in the urine 6. Hematuria with Large blood in the urine. Likely related to kidney stones. Patient the deferred a CT scan at this point. Given IV fluid hydration.  Pancytopenia. Possible due to chemotherapy. Follow-up CBC and Dr. Grayland Ormond as outpatient.  DISCHARGE CONDITIONS:  Stable, discharged to home today. CONSULTS OBTAINED:  Treatment Team:  Lloyd Huger, MD Lequita Asal, MD DRUG ALLERGIES:   Allergies  Allergen Reactions  . Hydroxychloroquine Sulfate     REACTION: rash  . Ibandronate Sodium     REACTION: rash  . Risedronate Sodium     REACTION: rash   DISCHARGE MEDICATIONS:   Allergies as of 03/13/2017      Reactions   Hydroxychloroquine Sulfate    REACTION: rash   Ibandronate Sodium    REACTION: rash   Risedronate Sodium    REACTION: rash      Medication List    TAKE these medications   ALPRAZolam 0.25 MG tablet Commonly known as:  XANAX Take 1 tablet (0.25 mg total) by mouth 2 (two) times daily as needed for anxiety.   CELEBREX 200 MG capsule Generic drug:  celecoxib Take 200 mg by mouth 2 (two) times daily.   levothyroxine 137 MCG tablet Commonly known as:  SYNTHROID, LEVOTHROID Take 1 tablet (137 mcg total) by mouth daily before breakfast.   lidocaine-prilocaine cream Commonly known as:  EMLA Apply to affected area once   ondansetron 8 MG tablet Commonly known as:  ZOFRAN Take 1 tablet (8 mg total) by mouth 2 (two) times daily as needed.   prochlorperazine 10 MG tablet Commonly known as:  COMPAZINE Take 1 tablet (10 mg total) by mouth every 6 (six) hours as needed (Nausea or vomiting).        DISCHARGE INSTRUCTIONS:  See AVS.  If you experience worsening of your admission symptoms, develop shortness of  breath, life threatening emergency, suicidal or homicidal thoughts you must seek medical attention immediately by calling 911 or calling your MD immediately  if symptoms less severe.  You Must read complete instructions/literature along with all the possible adverse reactions/side effects for all the Medicines you take and that have been prescribed to you. Take any new Medicines after you have completely understood and accpet all the possible adverse reactions/side effects.   Please note  You were cared for by a hospitalist during your hospital stay. If you have any questions about your discharge medications  or the care you received while you were in the hospital after you are discharged, you can call the unit and asked to speak with the hospitalist on call if the hospitalist that took care of you is not available. Once you are discharged, your primary care physician will handle any further medical issues. Please note that NO REFILLS for any discharge medications will be authorized once you are discharged, as it is imperative that you return to your primary care physician (or establish a relationship with a primary care physician if you do not have one) for your aftercare needs so that they can reassess your need for medications and monitor your lab values.    On the day of Discharge:  VITAL SIGNS:  Blood pressure (!) 117/51, pulse 89, temperature 98.4 F (36.9 C), temperature source Oral, resp. rate 20, height 5\' 4"  (1.626 m), weight 178 lb 12.8 oz (81.1 kg), SpO2 100 %. PHYSICAL EXAMINATION:  GENERAL:  71 y.o.-year-old patient lying in the bed with no acute distress.  EYES: Pupils equal, round, reactive to light and accommodation. No scleral icterus. Extraocular muscles intact.  HEENT: Head atraumatic, normocephalic. Oropharynx and nasopharynx clear.  NECK:  Supple, no jugular venous distention. No thyroid enlargement, no tenderness.  LUNGS: Normal breath sounds bilaterally, no wheezing, rales,rhonchi or crepitation. No use of accessory muscles of respiration.  CARDIOVASCULAR: S1, S2 normal. No murmurs, rubs, or gallops.  ABDOMEN: Soft, non-tender, non-distended. Bowel sounds present. No organomegaly or mass.  EXTREMITIES: No pedal edema, cyanosis, or clubbing.  NEUROLOGIC: Cranial nerves II through XII are intact. Muscle strength 5/5 in all extremities. Sensation intact. Gait not checked.  PSYCHIATRIC: The patient is alert and oriented x 3.  SKIN: No obvious rash, lesion, or ulcer.  DATA REVIEW:   CBC  Recent Labs Lab 03/13/17 0522  WBC 5.6  HGB 10.8*  HCT 31.7*  PLT 122*     Chemistries   Recent Labs Lab 03/10/17 1528 03/11/17 0452  NA 134* 136  K 3.9 3.7  CL 102 107  CO2 24 24  GLUCOSE 128* 103*  BUN 18 10  CREATININE 0.67 0.49  CALCIUM 8.7* 7.9*  AST 22  --   ALT 20  --   ALKPHOS 88  --   BILITOT 0.6  --      Microbiology Results  Results for orders placed or performed during the hospital encounter of 03/10/17  Blood Culture (routine x 2)     Status: None (Preliminary result)   Collection Time: 03/10/17  3:28 PM  Result Value Ref Range Status   Specimen Description BLOOD PORT  Final   Special Requests Blood Culture adequate volume  Final   Culture NO GROWTH 3 DAYS  Final   Report Status PENDING  Incomplete  Blood Culture (routine x 2)     Status: None (Preliminary result)   Collection Time: 03/10/17  3:28 PM  Result Value Ref Range Status  Specimen Description BLOOD RIGHT [facility]  Final   Special Requests   Final    Blood Culture results may not be optimal due to an excessive volume of blood received in culture bottles   Culture NO GROWTH 3 DAYS  Final   Report Status PENDING  Incomplete    RADIOLOGY:  No results found.   Management plans discussed with the patient, family and they are in agreement.  CODE STATUS: DNR   TOTAL TIME TAKING CARE OF THIS PATIENT: 31 minutes.    Demetrios Loll M.D on 03/13/2017 at 2:35 PM  Between 7am to 6pm - Pager - [phone]  After 6pm go to [email or web address] - [employer] [facility]  Office  [phone]  CC: Primary care physician; Tower, Wynelle Fanny, MD   Note: This dictation was prepared with Dragon dictation along with smaller phrase technology. Any transcriptional errors that result from this process are unintentional.

## 2017-03-15 LAB — CULTURE, BLOOD (ROUTINE X 2)
CULTURE: NO GROWTH
Culture: NO GROWTH
Special Requests: ADEQUATE

## 2017-03-16 ENCOUNTER — Telehealth: Payer: Self-pay | Admitting: *Deleted

## 2017-03-16 NOTE — Telephone Encounter (Signed)
Transition Care Management Follow-up Telephone Call   Date discharged? 03/13/2017   How have you been since you were released from the hospital? "ok just a little back pain."   Do you understand why you were in the hospital? yes   Do you understand the discharge instructions? yes   Where were you discharged to? home   Items Reviewed:  Medications reviewed: yes  Allergies reviewed: yes  Dietary changes reviewed: n/a  Referrals reviewed: yes   Functional Questionnaire:   Activities of Daily Living (ADLs):   She states they are independent in the following: in all areas and is not requiring assistance with ADLs    Any transportation issues/concerns?: no   Any patient concerns? no   Confirmed importance and date/time of follow-up visits scheduled no  Pt wanting to wait and schedule f/u after seeing oncology on 5/30  Confirmed with patient if condition begins to worsen call PCP or go to the ER.  Patient was given the office number and encouraged to call back with question or concerns.  : yes

## 2017-03-16 NOTE — Progress Notes (Signed)
[facility]  [phone] [phone]  ID: [name] OB: 06/16/1946  MR#: [id number]  CSN#:[id number]  Patient Care Team: Tower, Marne A, MD as PCP - General Tower, Marne A, MD as Consulting Physician (Family Medicine) Byrnett, Jeffrey W, MD (General Surgery)  CHIEF COMPLAINT: Clinical stage IB ER/PR positive, HER-2 negative invasive carcinoma of the upper-outer quadrant of the left breast.  INTERVAL HISTORY: Patient returns to clinic today for further evaluation and consideration of cycle 3 of Adriamycin and Cytoxan. She was recently admitted to the hospital for neutropenic fever. She continues to feel weak and fatigued.  She has a poor appetite. The joint pain from her rheumatoid arthritis is getting worse. She has no neurologic complaints. She denies any chest pain or shortness of breath. She has no urinary complaints. Patient feels generally terrible, but offers no further specific complaints today.  REVIEW OF SYSTEMS:   Review of Systems  Constitutional: Positive for malaise/fatigue. Negative for fever and weight loss.  Respiratory: Negative.  Negative for cough and shortness of breath.   Cardiovascular: Negative.  Negative for chest pain and leg swelling.  Gastrointestinal: Negative for abdominal pain, diarrhea and nausea.  Genitourinary: Negative.   Musculoskeletal: Positive for joint pain.  Skin: Negative.  Negative for rash.  Neurological: Positive for weakness. Negative for sensory change.  Psychiatric/Behavioral: Negative.  The patient is not nervous/anxious.     As per HPI. Otherwise, a complete review of systems is negative.  PAST MEDICAL HISTORY: Past Medical History:  Diagnosis Date  . Arthritis    RA  . Breast mass 1 year   . Cancer (HCC) 01/29/2017   left breast INVASIVE MAMMARY CARCINOMA, ER/PR positive  . Collagen vascular disease (HCC)    Rhematoid Arthritis  . DDD (degenerative disc disease)    in neck  .  Depression   . Fibromyalgia   . GERD (gastroesophageal reflux disease)    NO MEDS  . History of kidney stones    MULTIPLE KIDNEY STONES BIL  . Hypothyroidism   . Migraine   . Osteoporosis     PAST SURGICAL HISTORY: Past Surgical History:  Procedure Laterality Date  . BREAST BIOPSY Left 01/29/2017   US guided biopsy INVASIVE MAMMARY CARCINOMA  . JOINT REPLACEMENT  2003   total hip replacement  . LITHOTRIPSY  1997   kidney stone  . PORTACATH PLACEMENT Right 02/15/2017   Procedure: INSERTION PORT-A-CATH;  Surgeon: Jeffrey W Byrnett, MD;  Location: [facility] ORS;  Service: General;  Laterality: Right;  . TONSILLECTOMY  1970    FAMILY HISTORY: Family History  Problem Relation Age of Onset  . Alcohol abuse Father   . Diabetes Father   . Stroke Father   . Hypertension Mother   . Endometrial cancer Mother   . Osteoarthritis Mother   . Breast cancer Paternal Aunt 80  . Liver disease Sister   . Breast cancer Maternal Aunt   . Rheum arthritis Maternal Grandmother   . Diabetes Paternal Grandmother   . Parkinson's disease Paternal Grandfather     ADVANCED DIRECTIVES (Y/N):  N  HEALTH MAINTENANCE: Social History  Substance Use Topics  . Smoking status: Former Smoker    Packs/day: 1.50    Years: 25.00    Types: Cigarettes    Quit date: 10/20/1983  . Smokeless tobacco: Never Used  . Alcohol use No     Colonoscopy:  PAP:  Bone density:  Lipid panel:  Allergies  Allergen Reactions  . Hydroxychloroquine Sulfate       REACTION: rash  . Ibandronate Sodium     REACTION: rash  . Risedronate Sodium     REACTION: rash    Current Outpatient Prescriptions  Medication Sig Dispense Refill  . ALPRAZolam (XANAX) 0.25 MG tablet Take 1 tablet (0.25 mg total) by mouth 2 (two) times daily as needed for anxiety. 60 tablet 0  . CELEBREX 200 MG capsule Take 200 mg by mouth 2 (two) times daily.     . levothyroxine (SYNTHROID, LEVOTHROID) 137 MCG tablet Take 1 tablet (137 mcg total) by mouth  daily before breakfast. 30 tablet 11  . lidocaine-prilocaine (EMLA) cream Apply to affected area once 30 g 3  . ondansetron (ZOFRAN) 8 MG tablet Take 1 tablet (8 mg total) by mouth 2 (two) times daily as needed. 60 tablet 2  . prochlorperazine (COMPAZINE) 10 MG tablet Take 1 tablet (10 mg total) by mouth every 6 (six) hours as needed (Nausea or vomiting). 60 tablet 2   No current facility-administered medications for this visit.     OBJECTIVE: Vitals:   03/17/17 1120  BP: 124/83  Pulse: (!) 108  Resp: 20     Body mass index is 30.3 kg/m.    ECOG FS:0 - Asymptomatic  General: Ill-appearing, no acute distress. Eyes: Pink conjunctiva, anicteric sclera. Breasts: Easily palpable left breast mass. Exam deferred today. Lungs: Clear to auscultation bilaterally. Heart: Regular rate and rhythm. No rubs, murmurs, or gallops. Abdomen: Soft, nontender, nondistended. No organomegaly noted, normoactive bowel sounds. Musculoskeletal: No edema, cyanosis, or clubbing. Neuro: Alert, answering all questions appropriately. Cranial nerves grossly intact. Skin: No rashes or petechiae noted. Psych: Normal affect.   LAB RESULTS:  Lab Results  Component Value Date   NA 138 03/17/2017   K 3.4 (L) 03/17/2017   CL 108 03/17/2017   CO2 22 03/17/2017   GLUCOSE 139 (H) 03/17/2017   BUN 12 03/17/2017   CREATININE 0.60 03/17/2017   CALCIUM 8.5 (L) 03/17/2017   PROT 6.2 (L) 03/17/2017   ALBUMIN 3.2 (L) 03/17/2017   AST 30 03/17/2017   ALT 19 03/17/2017   ALKPHOS 83 03/17/2017   BILITOT 0.3 03/17/2017   GFRNONAA >60 03/17/2017   GFRAA >60 03/17/2017    Lab Results  Component Value Date   WBC 7.9 03/17/2017   NEUTROABS 6.2 03/17/2017   HGB 11.4 (L) 03/17/2017   HCT 33.5 (L) 03/17/2017   MCV 87.1 03/17/2017   PLT 315 03/17/2017     STUDIES: Dg Chest 2 View  Result Date: 03/10/2017 CLINICAL DATA:  Fever started today at noon.  Breast cancer. EXAM: CHEST  2 VIEW COMPARISON:  One-view chest  x-ray a 02/15/2017 FINDINGS: The heart size is normal. A right subclavian Port-A-Cath is stable in position. There is no significant edema or effusion. Minimal atelectasis is present without significant airspace disease. The visualized soft tissues and bony thorax are unremarkable. IMPRESSION: 1. No acute cardiopulmonary disease or signal change. 2. Stable positioning of right subclavian Port-A-Cath. Electronically Signed   By: Christopher  Mattern M.D.   On: 03/10/2017 16:09    ASSESSMENT: Clinical stage IB ER/PR positive, HER-2 negative invasive carcinoma of the upper-outer quadrant of the left breast  PLAN:    1. Clinical stage IB ER/PR positive, HER-2 negative invasive carcinoma of the upper-outer quadrant of the left breast: Given the size of patient's tumor, have recommended neoadjuvant chemotherapy. Patient will receive dose dense Adriamycin and Cytoxan followed by 12 weekly cycles of Taxol. Once patient completes chemotherapy, she will require lumpectomy or   mastectomy. She will then require adjuvant XRT depending on the type of surgery she chooses. Finally, patient will require an aromatase inhibitor for 5 years given the ER/PR positivity of her disease. Pretreatment MUGA on February 12, 2017 is adequate to proceed with an EF of 75%. CT of the chest, abdomen, pelvis did not reveal any evidence of metastatic disease. Delay cycle 3 of 4 of Adriamycin and Cytoxan with Neulasta support today. Patient has indicated she may want to discontinue treatment altogether, but has agreed to wait until next week to decide. Return to clinic in 1 week for reconsideration of cycle 3. 2. Rheumatoid arthritis: Patient has discontinued Enbrel since the diagnosis of her malignancy. Since this is a solid tumor, okay to restart from an oncology standpoint once chemotherapy has been completed.  3. Neutropenia: Continue Neulasta as above. 4. Pancytopenia: Resolved. Secondary to chemotherapy. Monitor. 5. Nausea: Continue  Zofran and Compazine as prescribed. 6. Diarrhea: Continue OTC Imodium as needed.  Patient expressed understanding and was in agreement with this plan. She also understands that She can call clinic at any time with any questions, concerns, or complaints.   Cancer Staging Malignant neoplasm of upper-outer quadrant of left breast in female, estrogen receptor negative ([place]) Staging form: Breast, AJCC 8th Edition - Clinical stage from 02/04/2017: Stage IB (cT2, cN0, cM0, G2, ER: Positive, PR: Positive, HER2: Negative) - Signed by Lloyd Huger, MD on 02/15/2017   Lloyd Huger, MD   03/20/2017 9:52 PM

## 2017-03-17 ENCOUNTER — Inpatient Hospital Stay: Payer: PPO

## 2017-03-17 ENCOUNTER — Inpatient Hospital Stay (HOSPITAL_BASED_OUTPATIENT_CLINIC_OR_DEPARTMENT_OTHER): Payer: PPO | Admitting: Oncology

## 2017-03-17 VITALS — BP 124/83 | HR 108 | Resp 20 | Wt 176.5 lb

## 2017-03-17 DIAGNOSIS — R630 Anorexia: Secondary | ICD-10-CM | POA: Diagnosis not present

## 2017-03-17 DIAGNOSIS — Z803 Family history of malignant neoplasm of breast: Secondary | ICD-10-CM

## 2017-03-17 DIAGNOSIS — Z7952 Long term (current) use of systemic steroids: Secondary | ICD-10-CM | POA: Diagnosis not present

## 2017-03-17 DIAGNOSIS — R634 Abnormal weight loss: Secondary | ICD-10-CM | POA: Diagnosis not present

## 2017-03-17 DIAGNOSIS — R5381 Other malaise: Secondary | ICD-10-CM | POA: Diagnosis not present

## 2017-03-17 DIAGNOSIS — M797 Fibromyalgia: Secondary | ICD-10-CM

## 2017-03-17 DIAGNOSIS — M810 Age-related osteoporosis without current pathological fracture: Secondary | ICD-10-CM | POA: Diagnosis not present

## 2017-03-17 DIAGNOSIS — I700 Atherosclerosis of aorta: Secondary | ICD-10-CM

## 2017-03-17 DIAGNOSIS — D701 Agranulocytosis secondary to cancer chemotherapy: Secondary | ICD-10-CM | POA: Diagnosis not present

## 2017-03-17 DIAGNOSIS — R110 Nausea: Secondary | ICD-10-CM

## 2017-03-17 DIAGNOSIS — E039 Hypothyroidism, unspecified: Secondary | ICD-10-CM | POA: Diagnosis not present

## 2017-03-17 DIAGNOSIS — M069 Rheumatoid arthritis, unspecified: Secondary | ICD-10-CM | POA: Diagnosis not present

## 2017-03-17 DIAGNOSIS — R531 Weakness: Secondary | ICD-10-CM

## 2017-03-17 DIAGNOSIS — Z79899 Other long term (current) drug therapy: Secondary | ICD-10-CM

## 2017-03-17 DIAGNOSIS — C50412 Malignant neoplasm of upper-outer quadrant of left female breast: Secondary | ICD-10-CM

## 2017-03-17 DIAGNOSIS — T451X5S Adverse effect of antineoplastic and immunosuppressive drugs, sequela: Secondary | ICD-10-CM | POA: Diagnosis not present

## 2017-03-17 DIAGNOSIS — R197 Diarrhea, unspecified: Secondary | ICD-10-CM | POA: Diagnosis not present

## 2017-03-17 DIAGNOSIS — K219 Gastro-esophageal reflux disease without esophagitis: Secondary | ICD-10-CM | POA: Diagnosis not present

## 2017-03-17 DIAGNOSIS — Z7982 Long term (current) use of aspirin: Secondary | ICD-10-CM

## 2017-03-17 DIAGNOSIS — Z87891 Personal history of nicotine dependence: Secondary | ICD-10-CM

## 2017-03-17 DIAGNOSIS — Z170 Estrogen receptor positive status [ER+]: Secondary | ICD-10-CM

## 2017-03-17 DIAGNOSIS — R5383 Other fatigue: Secondary | ICD-10-CM | POA: Diagnosis not present

## 2017-03-17 DIAGNOSIS — K039 Disease of hard tissues of teeth, unspecified: Secondary | ICD-10-CM

## 2017-03-17 DIAGNOSIS — Z87442 Personal history of urinary calculi: Secondary | ICD-10-CM

## 2017-03-17 DIAGNOSIS — D61810 Antineoplastic chemotherapy induced pancytopenia: Secondary | ICD-10-CM | POA: Diagnosis not present

## 2017-03-17 DIAGNOSIS — Z171 Estrogen receptor negative status [ER-]: Principal | ICD-10-CM

## 2017-03-17 DIAGNOSIS — Z5111 Encounter for antineoplastic chemotherapy: Secondary | ICD-10-CM | POA: Diagnosis not present

## 2017-03-17 LAB — COMPREHENSIVE METABOLIC PANEL
ALT: 19 U/L (ref 14–54)
ANION GAP: 8 (ref 5–15)
AST: 30 U/L (ref 15–41)
Albumin: 3.2 g/dL — ABNORMAL LOW (ref 3.5–5.0)
Alkaline Phosphatase: 83 U/L (ref 38–126)
BUN: 12 mg/dL (ref 6–20)
CHLORIDE: 108 mmol/L (ref 101–111)
CO2: 22 mmol/L (ref 22–32)
CREATININE: 0.6 mg/dL (ref 0.44–1.00)
Calcium: 8.5 mg/dL — ABNORMAL LOW (ref 8.9–10.3)
Glucose, Bld: 139 mg/dL — ABNORMAL HIGH (ref 65–99)
Potassium: 3.4 mmol/L — ABNORMAL LOW (ref 3.5–5.1)
SODIUM: 138 mmol/L (ref 135–145)
Total Bilirubin: 0.3 mg/dL (ref 0.3–1.2)
Total Protein: 6.2 g/dL — ABNORMAL LOW (ref 6.5–8.1)

## 2017-03-17 LAB — CBC WITH DIFFERENTIAL/PLATELET
Basophils Absolute: 0 10*3/uL (ref 0–0.1)
Basophils Relative: 0 %
EOS ABS: 0 10*3/uL (ref 0–0.7)
Eosinophils Relative: 0 %
HCT: 33.5 % — ABNORMAL LOW (ref 35.0–47.0)
Hemoglobin: 11.4 g/dL — ABNORMAL LOW (ref 12.0–16.0)
LYMPHS ABS: 0.9 10*3/uL — AB (ref 1.0–3.6)
LYMPHS PCT: 12 %
MCH: 29.7 pg (ref 26.0–34.0)
MCHC: 34.1 g/dL (ref 32.0–36.0)
MCV: 87.1 fL (ref 80.0–100.0)
MONO ABS: 0.7 10*3/uL (ref 0.2–0.9)
MONOS PCT: 9 %
Neutro Abs: 6.2 10*3/uL (ref 1.4–6.5)
Neutrophils Relative %: 79 %
PLATELETS: 315 10*3/uL (ref 150–440)
RBC: 3.84 MIL/uL (ref 3.80–5.20)
RDW: 14.2 % (ref 11.5–14.5)
WBC: 7.9 10*3/uL (ref 3.6–11.0)

## 2017-03-17 MED ORDER — HEPARIN SOD (PORK) LOCK FLUSH 100 UNIT/ML IV SOLN
500.0000 [IU] | Freq: Once | INTRAVENOUS | Status: AC
  Administered 2017-03-17: 500 [IU] via INTRAVENOUS
  Filled 2017-03-17: qty 5

## 2017-03-17 NOTE — Progress Notes (Signed)
Patient discharged from hospital, reports weakness and pain.

## 2017-03-22 ENCOUNTER — Encounter: Payer: Self-pay | Admitting: Family Medicine

## 2017-03-22 ENCOUNTER — Ambulatory Visit (INDEPENDENT_AMBULATORY_CARE_PROVIDER_SITE_OTHER): Payer: PPO | Admitting: Family Medicine

## 2017-03-22 VITALS — BP 142/78 | HR 104 | Temp 98.3°F | Ht 64.0 in | Wt 172.8 lb

## 2017-03-22 DIAGNOSIS — Z171 Estrogen receptor negative status [ER-]: Secondary | ICD-10-CM

## 2017-03-22 DIAGNOSIS — D709 Neutropenia, unspecified: Secondary | ICD-10-CM | POA: Diagnosis not present

## 2017-03-22 DIAGNOSIS — M8589 Other specified disorders of bone density and structure, multiple sites: Secondary | ICD-10-CM | POA: Diagnosis not present

## 2017-03-22 DIAGNOSIS — R5081 Fever presenting with conditions classified elsewhere: Secondary | ICD-10-CM

## 2017-03-22 DIAGNOSIS — E2839 Other primary ovarian failure: Secondary | ICD-10-CM

## 2017-03-22 DIAGNOSIS — M810 Age-related osteoporosis without current pathological fracture: Secondary | ICD-10-CM

## 2017-03-22 DIAGNOSIS — C50412 Malignant neoplasm of upper-outer quadrant of left female breast: Secondary | ICD-10-CM

## 2017-03-22 DIAGNOSIS — M4804 Spinal stenosis, thoracic region: Secondary | ICD-10-CM | POA: Diagnosis not present

## 2017-03-22 NOTE — Assessment & Plan Note (Signed)
Thoracic Rev CT-she does have disk herniation  In the midst of breast cancer tx  Will hold off on MRI for now  Not very symptomatic currently

## 2017-03-22 NOTE — Patient Instructions (Addendum)
When you see oncology- ask about Prolia for bone density and ask about vitamin D while you are taking chemo   Stay hydrated Take care of yourself  We will refer you for a bone density test   Glad you are feeling better

## 2017-03-22 NOTE — Progress Notes (Signed)
Subjective:    Patient ID: Samantha Valentine, female    DOB: 09-Feb-1946, 71 y.o.   MRN: [id number]  HPI Here for hospital f/u  Hospitalized from 03/10/17 to 03/13/17 for neutropenic fever with tachycardia   She is currently being treated for L breast invasive carcinoma (ER/PR pos) - this was thought to be due to chemotherapy (pancytopenia)  She sees Dr Grayland Ormond for oncology  She was tx with vancomycin in the ER neupogen - d/c when wbc improved Has had neg blood cultures   Wbc did go down to as low as 0.4 Hb as low as 9.5 Improved at d/c with wbc of 5.6 and HB of 10.8 Further improved with her oncology f/u 5/30 Lab Results  Component Value Date   WBC 7.9 03/17/2017   HGB 11.4 (L) 03/17/2017   HCT 33.5 (L) 03/17/2017   MCV 87.1 03/17/2017   PLT 315 03/17/2017      Chemistry      Component Value Date/Time   NA 138 03/17/2017 1034   K 3.4 (L) 03/17/2017 1034   CL 108 03/17/2017 1034   CO2 22 03/17/2017 1034   BUN 12 03/17/2017 1034   CREATININE 0.60 03/17/2017 1034   CREATININE 0.64 12/30/2016 0913      Component Value Date/Time   CALCIUM 8.5 (L) 03/17/2017 1034   ALKPHOS 83 03/17/2017 1034   AST 30 03/17/2017 1034   ALT 19 03/17/2017 1034   BILITOT 0.3 03/17/2017 1034       Dg Chest 2 View  Result Date: 03/10/2017 CLINICAL DATA:  Fever started today at noon.  Breast cancer. EXAM: CHEST  2 VIEW COMPARISON:  One-view chest x-ray a 02/15/2017 FINDINGS: The heart size is normal. A right subclavian Port-A-Cath is stable in position. There is no significant edema or effusion. Minimal atelectasis is present without significant airspace disease. The visualized soft tissues and bony thorax are unremarkable. IMPRESSION: 1. No acute cardiopulmonary disease or signal change. 2. Stable positioning of right subclavian Port-A-Cath. Electronically Signed   By: San Morelle M.D.   On: 03/10/2017 16:09     Incidental finding of blood in urine-thought to be due do renal sones = did  not get CT scan  She did have f/u with oncology on 5/30 Cycle 3 of Adriamycin and Cytoxan put off due to her recent neutropenia  rec Neulasta for her neutropenia  Plan is to follow with Taxol Then lumpectomy or mastectomy and then adj XRT depending on surg she chooses and likely aromatase inhibitor  Wt Readings from Last 3 Encounters:  03/22/17 172 lb 12 oz (78.4 kg)  03/17/17 176 lb 8 oz (80.1 kg)  03/10/17 178 lb 12.8 oz (81.1 kg)   bmi 29.6  Pt worries about OP - wants to schedule dexa She also notes she had T9-T10 herniation disk seen on CT  She has had mid back pain for a while   Hx of foot fracture in the past Rib fx in the past as well  She quit taking D because of chemo for now  Can not get a lot of exercise right now     Patient Active Problem List   Diagnosis Date Noted  . Estrogen deficiency 03/22/2017  . Neutropenic fever ([place]) 03/10/2017  . Malignant neoplasm of upper-outer quadrant of left breast in female, estrogen receptor negative ([place]) 02/04/2017  . Tendinopathy of right shoulder 01/25/2017  . Breast lump in female 01/04/2017  . High risk medication use 09/29/2016  . DJD (  degenerative joint disease), cervical 09/29/2016  . History of right hip replacement 09/29/2016  . Eczema 07/13/2014  . Adverse effect of immunosuppressive drug 04/27/2012  . ANXIETY DEPRESSION 10/28/2008  . Spinal stenosis 12/29/2007  . Hypothyroidism 12/28/2007  . Vitamin D deficiency 12/28/2007  . HEARING LOSS 12/28/2007  . Seropositive rheumatoid arthritis of multiple sites ([place]) 12/28/2007  . DDD (degenerative disc disease), lumbar 12/28/2007  . Fibromyalgia 12/28/2007  . Osteoporosis 12/28/2007  . MIGRAINES, HX OF 12/28/2007   Past Medical History:  Diagnosis Date  . Arthritis    RA  . Breast mass 1 year   . Cancer ([place]) 01/29/2017   left breast INVASIVE MAMMARY CARCINOMA, ER/PR positive  . Collagen vascular disease (HCC)    Rhematoid Arthritis  . DDD (degenerative  disc disease)    in neck  . Depression   . Fibromyalgia   . GERD (gastroesophageal reflux disease)    NO MEDS  . History of kidney stones    MULTIPLE KIDNEY STONES BIL  . Hypothyroidism   . Migraine   . Osteoporosis    Past Surgical History:  Procedure Laterality Date  . BREAST BIOPSY Left 01/29/2017   US guided biopsy INVASIVE MAMMARY CARCINOMA  . JOINT REPLACEMENT  2003   total hip replacement  . LITHOTRIPSY  1997   kidney stone  . PORTACATH PLACEMENT Right 02/15/2017   Procedure: INSERTION PORT-A-CATH;  Surgeon: Robert Bellow, MD;  Location: [facility] ORS;  Service: General;  Laterality: Right;  . TONSILLECTOMY  1970   Social History  Substance Use Topics  . Smoking status: Former Smoker    Packs/day: 1.50    Years: 25.00    Types: Cigarettes    Quit date: 10/20/1983  . Smokeless tobacco: Never Used  . Alcohol use No   Family History  Problem Relation Age of Onset  . Alcohol abuse Father   . Diabetes Father   . Stroke Father   . Hypertension Mother   . Endometrial cancer Mother   . Osteoarthritis Mother   . Breast cancer Paternal Aunt 75  . Liver disease Sister   . Breast cancer Maternal Aunt   . Rheum arthritis Maternal Grandmother   . Diabetes Paternal Grandmother   . Parkinson's disease Paternal Grandfather    Allergies  Allergen Reactions  . Hydroxychloroquine Sulfate     REACTION: rash  . Ibandronate Sodium     REACTION: rash  . Risedronate Sodium     REACTION: rash   Current Outpatient Prescriptions on File Prior to Visit  Medication Sig Dispense Refill  . ALPRAZolam (XANAX) 0.25 MG tablet Take 1 tablet (0.25 mg total) by mouth 2 (two) times daily as needed for anxiety. 60 tablet 0  . CELEBREX 200 MG capsule Take 200 mg by mouth 2 (two) times daily.     Marland Kitchen levothyroxine (SYNTHROID, LEVOTHROID) 137 MCG tablet Take 1 tablet (137 mcg total) by mouth daily before breakfast. 30 tablet 11  . lidocaine-prilocaine (EMLA) cream Apply to affected area once 30  g 3  . ondansetron (ZOFRAN) 8 MG tablet Take 1 tablet (8 mg total) by mouth 2 (two) times daily as needed. 60 tablet 2  . prochlorperazine (COMPAZINE) 10 MG tablet Take 1 tablet (10 mg total) by mouth every 6 (six) hours as needed (Nausea or vomiting). 60 tablet 2   No current facility-administered medications on file prior to visit.     Review of Systems Review of Systems  Constitutional: Negative for fever,  and unexpected weight  change. pos for dec appetite and fatigue  Eyes: Negative for pain and visual disturbance.  Respiratory: Negative for cough and shortness of breath.   Cardiovascular: Negative for cp or palpitations    Gastrointestinal: Negative for nausea, diarrhea and constipation.  Genitourinary: Negative for urgency and frequency.  Skin: Negative for pallor or rash   MSK pos for joint pain and back pain /(RA) Neurological: Negative for weakness, light-headedness, numbness and headaches.  Hematological: Negative for adenopathy. Does not bruise/bleed easily.  Psychiatric/Behavioral: Negative for dysphoric mood. The patient is not nervous/anxious. Pos for stressors         Objective:   Physical Exam  Constitutional: She appears well-developed and well-nourished. No distress.  Mildly tired but well appearing   HENT:  Head: Normocephalic and atraumatic.  Mouth/Throat: Oropharynx is clear and moist.  Eyes: Conjunctivae and EOM are normal. Pupils are equal, round, and reactive to light.  Neck: Normal range of motion. Neck supple. No JVD present. Carotid bruit is not present. No thyromegaly present.  Cardiovascular: Normal rate, regular rhythm, normal heart sounds and intact distal pulses.  Exam reveals no gallop.   Pulmonary/Chest: Effort normal and breath sounds normal. No respiratory distress. She has no wheezes. She has no rales.  No crackles  Abdominal: Soft. Bowel sounds are normal. She exhibits no distension, no abdominal bruit and no mass. There is no tenderness.    Musculoskeletal: She exhibits no edema.  No kyphosis   No TS tenderness today  Lymphadenopathy:    She has no cervical adenopathy.  Neurological: She is alert. She has normal reflexes. No cranial nerve deficit. She exhibits normal muscle tone. Coordination normal.  Skin: Skin is warm and dry. No rash noted. No pallor.  Psychiatric: She has a normal mood and affect.  Pleasant and talkative           Assessment & Plan:   Problem List Items Addressed This Visit      Musculoskeletal and Integument   Osteoporosis    dexa ordered  Pt is worried about progression of bone loss due to her cancer treatment  She may be a candidate for Prolia and she will disc this with her oncologist  She will also get back on ca and D when done with chemo 2 y of forteo in the past  Allergic to bisphosphenates Hx of foot and rib fx in the past  Sees rheumatology as well        Other   Estrogen deficiency   Relevant Orders   DG Bone Density   Malignant neoplasm of upper-outer quadrant of left breast in female, estrogen receptor negative ([place]) - Primary    2 cycles of chemo - with recent hosp for neutropenic fever  Feeling better now  Put off 3rd cycle- deciding whether to take it  Will likely have mastectomy/ poss followed by radiation and aromatase inhibitor if she chooses For oncol f/u soon  Labs rev      Neutropenic fever ([place])    From chemo for breast cancer Reviewed hospital records, lab results and studies in detail  F/u labs 5/30 re assuring Feeling much better For oncol f/u 1 wk- will decide whether to continue chemo      Spinal stenosis    Thoracic Rev CT-she does have disk herniation  In the midst of breast cancer tx  Will hold off on MRI for now  Not very symptomatic currently        Other Visit Diagnoses  Osteopenia of multiple sites

## 2017-03-22 NOTE — Assessment & Plan Note (Signed)
From chemo for breast cancer Reviewed hospital records, lab results and studies in detail  F/u labs 5/30 re assuring Feeling much better For oncol f/u 1 wk- will decide whether to continue chemo

## 2017-03-22 NOTE — Assessment & Plan Note (Signed)
dexa ordered  Pt is worried about progression of bone loss due to her cancer treatment  She may be a candidate for Prolia and she will disc this with her oncologist  She will also get back on ca and D when done with chemo

## 2017-03-22 NOTE — Assessment & Plan Note (Addendum)
dexa ordered  Pt is worried about progression of bone loss due to her cancer treatment  She may be a candidate for Prolia and she will disc this with her oncologist  She will also get back on ca and D when done with chemo 2 y of forteo in the past  Allergic to bisphosphenates Hx of foot and rib fx in the past  Sees rheumatology as well

## 2017-03-22 NOTE — Assessment & Plan Note (Signed)
2 cycles of chemo - with recent hosp for neutropenic fever  Feeling better now  Put off 3rd cycle- deciding whether to take it  Will likely have mastectomy/ poss followed by radiation and aromatase inhibitor if she chooses For oncol f/u soon  Labs rev

## 2017-03-23 NOTE — Progress Notes (Signed)
[facility]  Telephone:(336) [phone] Fax:(336) [phone]  ID: Samantha Valentine OB: 20-Jan-1946  MR#: [id number]  YYT#:[id number]  Patient Care Team: Abner Greenspan, MD as PCP - General Tower, Wynelle Fanny, MD as Consulting Physician (Family Medicine) Bary Castilla, Forest Gleason, MD (General Surgery)  CHIEF COMPLAINT: Clinical stage IB ER/PR positive, HER-2 negative invasive carcinoma of the upper-outer quadrant of the left breast.  INTERVAL HISTORY: Patient returns to clinic today for further evaluation and consideration of cycle 3 of Adriamycin and Cytoxan. She currently feels well and is asymptomatic. Her weakness and fatigue have improved. She continues to have a fair appetite. The joint pain from her rheumatoid arthritis is slightly better. She has no neurologic complaints. She denies any chest pain or shortness of breath. She has no urinary complaints. Patient offers no further specific complaints today.  REVIEW OF SYSTEMS:   Review of Systems  Constitutional: Positive for malaise/fatigue. Negative for fever and weight loss.  Respiratory: Negative.  Negative for cough and shortness of breath.   Cardiovascular: Negative.  Negative for chest pain and leg swelling.  Gastrointestinal: Negative for abdominal pain, diarrhea and nausea.  Genitourinary: Negative.   Musculoskeletal: Positive for joint pain.  Skin: Negative.  Negative for rash.  Neurological: Positive for weakness. Negative for sensory change.  Psychiatric/Behavioral: Negative.  The patient is not nervous/anxious.     As per HPI. Otherwise, a complete review of systems is negative.  PAST MEDICAL HISTORY: Past Medical History:  Diagnosis Date  . Arthritis    RA  . Breast mass 1 year   . Cancer ([place]) 01/29/2017   left breast INVASIVE MAMMARY CARCINOMA, ER/PR positive  . Collagen vascular disease (HCC)    Rhematoid Arthritis  . DDD (degenerative disc disease)    in neck  . Depression   . Fibromyalgia   . GERD  (gastroesophageal reflux disease)    NO MEDS  . History of kidney stones    MULTIPLE KIDNEY STONES BIL  . Hypothyroidism   . Migraine   . Osteoporosis     PAST SURGICAL HISTORY: Past Surgical History:  Procedure Laterality Date  . BREAST BIOPSY Left 01/29/2017   US guided biopsy INVASIVE MAMMARY CARCINOMA  . JOINT REPLACEMENT  2003   total hip replacement  . LITHOTRIPSY  1997   kidney stone  . PORTACATH PLACEMENT Right 02/15/2017   Procedure: INSERTION PORT-A-CATH;  Surgeon: Robert Bellow, MD;  Location: [facility] ORS;  Service: General;  Laterality: Right;  . TONSILLECTOMY  1970    FAMILY HISTORY: Family History  Problem Relation Age of Onset  . Alcohol abuse Father   . Diabetes Father   . Stroke Father   . Hypertension Mother   . Endometrial cancer Mother   . Osteoarthritis Mother   . Breast cancer Paternal Aunt 90  . Liver disease Sister   . Breast cancer Maternal Aunt   . Rheum arthritis Maternal Grandmother   . Diabetes Paternal Grandmother   . Parkinson's disease Paternal Grandfather     ADVANCED DIRECTIVES (Y/N):  N  HEALTH MAINTENANCE: Social History  Substance Use Topics  . Smoking status: Former Smoker    Packs/day: 1.50    Years: 25.00    Types: Cigarettes    Quit date: 10/20/1983  . Smokeless tobacco: Never Used  . Alcohol use No     Colonoscopy:  PAP:  Bone density:  Lipid panel:  Allergies  Allergen Reactions  . Hydroxychloroquine Sulfate     REACTION: rash  .  Ibandronate Sodium     REACTION: rash  . Risedronate Sodium     REACTION: rash    Current Outpatient Prescriptions  Medication Sig Dispense Refill  . ALPRAZolam (XANAX) 0.25 MG tablet Take 1 tablet (0.25 mg total) by mouth 2 (two) times daily as needed for anxiety. 60 tablet 0  . CELEBREX 200 MG capsule Take 200 mg by mouth 2 (two) times daily.     Marland Kitchen levothyroxine (SYNTHROID, LEVOTHROID) 137 MCG tablet Take 1 tablet (137 mcg total) by mouth daily before breakfast. 30 tablet 11    . lidocaine-prilocaine (EMLA) cream Apply to affected area once 30 g 3  . ondansetron (ZOFRAN) 8 MG tablet Take 1 tablet (8 mg total) by mouth 2 (two) times daily as needed. 60 tablet 2  . prochlorperazine (COMPAZINE) 10 MG tablet Take 1 tablet (10 mg total) by mouth every 6 (six) hours as needed (Nausea or vomiting). 60 tablet 2   No current facility-administered medications for this visit.     OBJECTIVE: Vitals:   03/24/17 1101  BP: 124/78  Pulse: 94  Temp: 99.1 F (37.3 C)     Body mass index is 29.79 kg/m.    ECOG FS:0 - Asymptomatic  General: Ill-appearing, no acute distress. Eyes: Pink conjunctiva, anicteric sclera. Breasts: Easily palpable left breast mass. Exam deferred today. Lungs: Clear to auscultation bilaterally. Heart: Regular rate and rhythm. No rubs, murmurs, or gallops. Abdomen: Soft, nontender, nondistended. No organomegaly noted, normoactive bowel sounds. Musculoskeletal: No edema, cyanosis, or clubbing. Neuro: Alert, answering all questions appropriately. Cranial nerves grossly intact. Skin: No rashes or petechiae noted. Psych: Normal affect.   LAB RESULTS:  Lab Results  Component Value Date   NA 135 03/24/2017   K 4.1 03/24/2017   CL 104 03/24/2017   CO2 24 03/24/2017   GLUCOSE 95 03/24/2017   BUN 18 03/24/2017   CREATININE 0.65 03/24/2017   CALCIUM 8.9 03/24/2017   PROT 6.7 03/24/2017   ALBUMIN 3.5 03/24/2017   AST 44 (H) 03/24/2017   ALT 17 03/24/2017   ALKPHOS 75 03/24/2017   BILITOT 0.4 03/24/2017   GFRNONAA >60 03/24/2017   GFRAA >60 03/24/2017    Lab Results  Component Value Date   WBC 10.0 03/24/2017   NEUTROABS 7.5 (H) 03/24/2017   HGB 11.9 (L) 03/24/2017   HCT 34.7 (L) 03/24/2017   MCV 88.7 03/24/2017   PLT 391 03/24/2017     STUDIES: Dg Chest 2 View  Result Date: 03/10/2017 CLINICAL DATA:  Fever started today at noon.  Breast cancer. EXAM: CHEST  2 VIEW COMPARISON:  One-view chest x-ray a 02/15/2017 FINDINGS: The heart  size is normal. A right subclavian Port-A-Cath is stable in position. There is no significant edema or effusion. Minimal atelectasis is present without significant airspace disease. The visualized soft tissues and bony thorax are unremarkable. IMPRESSION: 1. No acute cardiopulmonary disease or signal change. 2. Stable positioning of right subclavian Port-A-Cath. Electronically Signed   By: San Morelle M.D.   On: 03/10/2017 16:09    ASSESSMENT: Clinical stage IB ER/PR positive, HER-2 negative invasive carcinoma of the upper-outer quadrant of the left breast  PLAN:    1. Clinical stage IB ER/PR positive, HER-2 negative invasive carcinoma of the upper-outer quadrant of the left breast: Given the size of patient's tumor, have recommended neoadjuvant chemotherapy. Once patient completes chemotherapy, she will require lumpectomy or mastectomy. She will then require adjuvant XRT depending on the type of surgery she chooses, although patient has indicated  she likely will pursue mastectomy. Finally, patient will require an aromatase inhibitor for 5 years given the ER/PR positivity of her disease. Pretreatment MUGA on February 12, 2017 is adequate to proceed with an EF of 75%. CT of the chest, abdomen, pelvis did not reveal any evidence of metastatic disease. Proceed with cycle 3 of 4 of Adriamycin and Cytoxan with Neulasta support today. Patient has agreed to complete 4 cycles of Adriamycin and Cytoxan, but has indicated she will likely decline additional treatment with Taxol. She also has requested an additional week off in between treatments therefore will return to clinic in 3 weeks for consideration of cycle 4. 2. Rheumatoid arthritis: Patient has discontinued Enbrel since the diagnosis of her malignancy. Since this is a solid tumor, okay to restart from an oncology standpoint once chemotherapy has been completed.  3. Neutropenia: Continue Neulasta as above. 4. Pancytopenia: Resolved. Secondary to  chemotherapy. Monitor. 5. Nausea: Continue Zofran and Compazine as prescribed. 6. Diarrhea: Continue OTC Imodium as needed.  Patient expressed understanding and was in agreement with this plan. She also understands that She can call clinic at any time with any questions, concerns, or complaints.   Cancer Staging Malignant neoplasm of upper-outer quadrant of left breast in female, estrogen receptor negative ([place]) Staging form: Breast, AJCC 8th Edition - Clinical stage from 02/04/2017: Stage IB (cT2, cN0, cM0, G2, ER: Positive, PR: Positive, HER2: Negative) - Signed by Lloyd Huger, MD on 02/15/2017   Lloyd Huger, MD   03/30/2017 2:09 PM

## 2017-03-24 ENCOUNTER — Inpatient Hospital Stay (HOSPITAL_BASED_OUTPATIENT_CLINIC_OR_DEPARTMENT_OTHER): Payer: PPO | Admitting: Oncology

## 2017-03-24 ENCOUNTER — Inpatient Hospital Stay: Payer: PPO

## 2017-03-24 ENCOUNTER — Inpatient Hospital Stay: Payer: PPO | Attending: Oncology

## 2017-03-24 VITALS — BP 124/78 | HR 94 | Temp 99.1°F | Wt 173.6 lb

## 2017-03-24 DIAGNOSIS — Z79899 Other long term (current) drug therapy: Secondary | ICD-10-CM | POA: Diagnosis not present

## 2017-03-24 DIAGNOSIS — R110 Nausea: Secondary | ICD-10-CM

## 2017-03-24 DIAGNOSIS — T451X5S Adverse effect of antineoplastic and immunosuppressive drugs, sequela: Secondary | ICD-10-CM | POA: Diagnosis not present

## 2017-03-24 DIAGNOSIS — M069 Rheumatoid arthritis, unspecified: Secondary | ICD-10-CM

## 2017-03-24 DIAGNOSIS — R5383 Other fatigue: Secondary | ICD-10-CM | POA: Diagnosis not present

## 2017-03-24 DIAGNOSIS — R5381 Other malaise: Secondary | ICD-10-CM | POA: Insufficient documentation

## 2017-03-24 DIAGNOSIS — E039 Hypothyroidism, unspecified: Secondary | ICD-10-CM

## 2017-03-24 DIAGNOSIS — Z5111 Encounter for antineoplastic chemotherapy: Secondary | ICD-10-CM | POA: Insufficient documentation

## 2017-03-24 DIAGNOSIS — D701 Agranulocytosis secondary to cancer chemotherapy: Secondary | ICD-10-CM | POA: Diagnosis not present

## 2017-03-24 DIAGNOSIS — C50412 Malignant neoplasm of upper-outer quadrant of left female breast: Secondary | ICD-10-CM | POA: Diagnosis not present

## 2017-03-24 DIAGNOSIS — Z8049 Family history of malignant neoplasm of other genital organs: Secondary | ICD-10-CM | POA: Diagnosis not present

## 2017-03-24 DIAGNOSIS — K219 Gastro-esophageal reflux disease without esophagitis: Secondary | ICD-10-CM | POA: Diagnosis not present

## 2017-03-24 DIAGNOSIS — M797 Fibromyalgia: Secondary | ICD-10-CM | POA: Diagnosis not present

## 2017-03-24 DIAGNOSIS — R197 Diarrhea, unspecified: Secondary | ICD-10-CM | POA: Diagnosis not present

## 2017-03-24 DIAGNOSIS — M25472 Effusion, left ankle: Secondary | ICD-10-CM | POA: Diagnosis not present

## 2017-03-24 DIAGNOSIS — R531 Weakness: Secondary | ICD-10-CM

## 2017-03-24 DIAGNOSIS — R600 Localized edema: Secondary | ICD-10-CM | POA: Diagnosis not present

## 2017-03-24 DIAGNOSIS — M810 Age-related osteoporosis without current pathological fracture: Secondary | ICD-10-CM

## 2017-03-24 DIAGNOSIS — Z87442 Personal history of urinary calculi: Secondary | ICD-10-CM | POA: Diagnosis not present

## 2017-03-24 DIAGNOSIS — Z170 Estrogen receptor positive status [ER+]: Secondary | ICD-10-CM | POA: Diagnosis not present

## 2017-03-24 DIAGNOSIS — Z87891 Personal history of nicotine dependence: Secondary | ICD-10-CM | POA: Insufficient documentation

## 2017-03-24 DIAGNOSIS — Z803 Family history of malignant neoplasm of breast: Secondary | ICD-10-CM

## 2017-03-24 DIAGNOSIS — Z171 Estrogen receptor negative status [ER-]: Secondary | ICD-10-CM

## 2017-03-24 LAB — COMPREHENSIVE METABOLIC PANEL
ALBUMIN: 3.5 g/dL (ref 3.5–5.0)
ALK PHOS: 75 U/L (ref 38–126)
ALT: 17 U/L (ref 14–54)
ANION GAP: 7 (ref 5–15)
AST: 44 U/L — AB (ref 15–41)
BILIRUBIN TOTAL: 0.4 mg/dL (ref 0.3–1.2)
BUN: 18 mg/dL (ref 6–20)
CALCIUM: 8.9 mg/dL (ref 8.9–10.3)
CO2: 24 mmol/L (ref 22–32)
Chloride: 104 mmol/L (ref 101–111)
Creatinine, Ser: 0.65 mg/dL (ref 0.44–1.00)
GFR calc Af Amer: >60 mL/min (ref >60)
GFR calc non Af Amer: >60 mL/min (ref >60)
GLUCOSE: 95 mg/dL (ref 65–99)
POTASSIUM: 4.1 mmol/L (ref 3.5–5.1)
SODIUM: 135 mmol/L (ref 135–145)
TOTAL PROTEIN: 6.7 g/dL (ref 6.5–8.1)

## 2017-03-24 LAB — CBC WITH DIFFERENTIAL/PLATELET
BASOS ABS: 0.1 10*3/uL (ref 0–0.1)
BASOS PCT: 2 %
EOS ABS: 0 10*3/uL (ref 0–0.7)
Eosinophils Relative: 0 %
HCT: 34.7 % — ABNORMAL LOW (ref 35.0–47.0)
Hemoglobin: 11.9 g/dL — ABNORMAL LOW (ref 12.0–16.0)
Lymphocytes Relative: 12 %
Lymphs Abs: 1.2 10*3/uL (ref 1.0–3.6)
MCH: 30.5 pg (ref 26.0–34.0)
MCHC: 34.4 g/dL (ref 32.0–36.0)
MCV: 88.7 fL (ref 80.0–100.0)
Monocytes Absolute: 1.2 10*3/uL — ABNORMAL HIGH (ref 0.2–0.9)
Monocytes Relative: 12 %
NEUTROS PCT: 74 %
Neutro Abs: 7.5 10*3/uL — ABNORMAL HIGH (ref 1.4–6.5)
Platelets: 391 10*3/uL (ref 150–440)
RBC: 3.91 MIL/uL (ref 3.80–5.20)
RDW: 16.5 % — ABNORMAL HIGH (ref 11.5–14.5)
WBC: 10 10*3/uL (ref 3.6–11.0)

## 2017-03-24 MED ORDER — SODIUM CHLORIDE 0.9 % IV SOLN
Freq: Once | INTRAVENOUS | Status: AC
  Administered 2017-03-24: 13:00:00 via INTRAVENOUS
  Filled 2017-03-24: qty 5

## 2017-03-24 MED ORDER — SODIUM CHLORIDE 0.9 % IV SOLN
Freq: Once | INTRAVENOUS | Status: AC
  Administered 2017-03-24: 12:00:00 via INTRAVENOUS
  Filled 2017-03-24: qty 1000

## 2017-03-24 MED ORDER — SODIUM CHLORIDE 0.9% FLUSH
10.0000 mL | INTRAVENOUS | Status: DC | PRN
  Filled 2017-03-24: qty 10

## 2017-03-24 MED ORDER — SODIUM CHLORIDE 0.9 % IV SOLN
600.0000 mg/m2 | Freq: Once | INTRAVENOUS | Status: AC
  Administered 2017-03-24: 1160 mg via INTRAVENOUS
  Filled 2017-03-24: qty 50

## 2017-03-24 MED ORDER — PEGFILGRASTIM 6 MG/0.6ML ~~LOC~~ PSKT
PREFILLED_SYRINGE | SUBCUTANEOUS | Status: AC
  Filled 2017-03-24: qty 0.6

## 2017-03-24 MED ORDER — PALONOSETRON HCL INJECTION 0.25 MG/5ML
0.2500 mg | Freq: Once | INTRAVENOUS | Status: AC
  Administered 2017-03-24: 0.25 mg via INTRAVENOUS
  Filled 2017-03-24: qty 5

## 2017-03-24 MED ORDER — HEPARIN SOD (PORK) LOCK FLUSH 100 UNIT/ML IV SOLN
500.0000 [IU] | Freq: Once | INTRAVENOUS | Status: AC | PRN
  Administered 2017-03-24: 500 [IU]
  Filled 2017-03-24: qty 5

## 2017-03-24 MED ORDER — DOXORUBICIN HCL CHEMO IV INJECTION 2 MG/ML
60.0000 mg/m2 | Freq: Once | INTRAVENOUS | Status: AC
  Administered 2017-03-24: 116 mg via INTRAVENOUS
  Filled 2017-03-24: qty 50

## 2017-03-24 MED ORDER — PEGFILGRASTIM 6 MG/0.6ML ~~LOC~~ PSKT
6.0000 mg | PREFILLED_SYRINGE | Freq: Once | SUBCUTANEOUS | Status: AC
  Administered 2017-03-24: 6 mg via SUBCUTANEOUS

## 2017-03-24 NOTE — Progress Notes (Signed)
Patient here today for follow up.  Patient states no new concerns today  

## 2017-04-13 NOTE — Progress Notes (Signed)
[facility]  Telephone:(336) [phone] Fax:(336) [phone]  ID: Samantha Valentine OB: [birth year]  MR#: [id number]  KTG#:[id number]  Patient Care Team: Abner Greenspan, MD as PCP - General Tower, Wynelle Fanny, MD as Consulting Physician (Family Medicine) Bary Castilla, Forest Gleason, MD (General Surgery)  CHIEF COMPLAINT: Clinical stage IB ER/PR positive, HER-2 negative invasive carcinoma of the upper-outer quadrant of the left breast.  INTERVAL HISTORY: Patient returns to clinic today for further evaluation and consideration of cycle 4 of 4 of Adriamycin and Cytoxan.  She is complaining of a swollen left ankle/calf. She otherwise  feels well and is asymptomatic. Her weakness and fatigue have resolved. She continues to have a fair appetite. The joint pain from her rheumatoid arthritis is slightly better. She has no neurologic complaints. She denies any chest pain or shortness of breath. She has no urinary complaints. Patient offers no specific complaints today.  REVIEW OF SYSTEMS:   Review of Systems  Constitutional: Negative for fever, malaise/fatigue and weight loss.  Respiratory: Negative.  Negative for cough and shortness of breath.   Cardiovascular: Negative.  Negative for chest pain and leg swelling.  Gastrointestinal: Negative for abdominal pain, diarrhea and nausea.  Genitourinary: Negative.   Musculoskeletal: Positive for joint pain.  Skin: Negative.  Negative for rash.  Neurological: Negative for sensory change and weakness.  Psychiatric/Behavioral: Negative.  The patient is not nervous/anxious.     As per HPI. Otherwise, a complete review of systems is negative.  PAST MEDICAL HISTORY: Past Medical History:  Diagnosis Date  . Arthritis    RA  . Breast mass 1 year   . Cancer ([place]) 01/29/2017   left breast INVASIVE MAMMARY CARCINOMA, ER/PR positive  . Collagen vascular disease (HCC)    Rhematoid Arthritis  . DDD (degenerative disc disease)    in neck  . Depression     . Fibromyalgia   . GERD (gastroesophageal reflux disease)    NO MEDS  . History of kidney stones    MULTIPLE KIDNEY STONES BIL  . Hypothyroidism   . Migraine   . Osteoporosis     PAST SURGICAL HISTORY: Past Surgical History:  Procedure Laterality Date  . BREAST BIOPSY Left 01/29/2017   US guided biopsy INVASIVE MAMMARY CARCINOMA  . JOINT REPLACEMENT  2003   total hip replacement  . LITHOTRIPSY  1997   kidney stone  . PORTACATH PLACEMENT Right 02/15/2017   Procedure: INSERTION PORT-A-CATH;  Surgeon: Robert Bellow, MD;  Location: [facility] ORS;  Service: General;  Laterality: Right;  . TONSILLECTOMY  1970    FAMILY HISTORY: Family History  Problem Relation Age of Onset  . Alcohol abuse Father   . Diabetes Father   . Stroke Father   . Hypertension Mother   . Endometrial cancer Mother   . Osteoarthritis Mother   . Breast cancer Paternal Aunt 64  . Liver disease Sister   . Breast cancer Maternal Aunt   . Rheum arthritis Maternal Grandmother   . Diabetes Paternal Grandmother   . Parkinson's disease Paternal Grandfather     ADVANCED DIRECTIVES (Y/N):  N  HEALTH MAINTENANCE: Social History  Substance Use Topics  . Smoking status: Former Smoker    Packs/day: 1.50    Years: 25.00    Types: Cigarettes    Quit date: 10/20/1983  . Smokeless tobacco: Never Used  . Alcohol use No     Colonoscopy:  PAP:  Bone density:  Lipid panel:  Allergies  Allergen Reactions  . Hydroxychloroquine  Sulfate     REACTION: rash  . Ibandronate Sodium     REACTION: rash  . Risedronate Sodium     REACTION: rash    Current Outpatient Prescriptions  Medication Sig Dispense Refill  . ALPRAZolam (XANAX) 0.25 MG tablet Take 1 tablet (0.25 mg total) by mouth 2 (two) times daily as needed for anxiety. 60 tablet 0  . CELEBREX 200 MG capsule Take 200 mg by mouth 2 (two) times daily.     Marland Kitchen levothyroxine (SYNTHROID, LEVOTHROID) 137 MCG tablet Take 1 tablet (137 mcg total) by mouth daily  before breakfast. 30 tablet 11  . lidocaine-prilocaine (EMLA) cream Apply to affected area once 30 g 3  . ondansetron (ZOFRAN) 8 MG tablet Take 1 tablet (8 mg total) by mouth 2 (two) times daily as needed. 60 tablet 2  . prochlorperazine (COMPAZINE) 10 MG tablet Take 1 tablet (10 mg total) by mouth every 6 (six) hours as needed (Nausea or vomiting). 60 tablet 2   No current facility-administered medications for this visit.     OBJECTIVE: Vitals:   04/14/17 1041  BP: (!) 150/101  Pulse: 73  Resp: 20  Temp: 98.1 F (36.7 C)     Body mass index is 29.88 kg/m.    ECOG FS:0 - Asymptomatic  General: Ill-appearing, no acute distress. Eyes: Pink conjunctiva, anicteric sclera. Breasts: Easily palpable left breast mass. Exam deferred today. Lungs: Clear to auscultation bilaterally. Heart: Regular rate and rhythm. No rubs, murmurs, or gallops. Abdomen: Soft, nontender, nondistended. No organomegaly noted, normoactive bowel sounds. Musculoskeletal: No edema, cyanosis, or clubbing. Neuro: Alert, answering all questions appropriately. Cranial nerves grossly intact. Skin: No rashes or petechiae noted. Psych: Normal affect.   LAB RESULTS:  Lab Results  Component Value Date   NA 139 04/14/2017   K 3.5 04/14/2017   CL 108 04/14/2017   CO2 25 04/14/2017   GLUCOSE 97 04/14/2017   BUN 13 04/14/2017   CREATININE 0.47 04/14/2017   CALCIUM 8.6 (L) 04/14/2017   PROT 6.3 (L) 04/14/2017   ALBUMIN 3.3 (L) 04/14/2017   AST 26 04/14/2017   ALT 15 04/14/2017   ALKPHOS 77 04/14/2017   BILITOT 0.4 04/14/2017   GFRNONAA >60 04/14/2017   GFRAA >60 04/14/2017    Lab Results  Component Value Date   WBC 4.6 04/14/2017   NEUTROABS 2.9 04/14/2017   HGB 11.1 (L) 04/14/2017   HCT 32.3 (L) 04/14/2017   MCV 91.2 04/14/2017   PLT 323 04/14/2017     STUDIES: US Venous Img Lower Unilateral Left  Result Date: 04/15/2017 CLINICAL DATA:  71 year old female with left lower extremity swelling EXAM:  LEFT LOWER EXTREMITY VENOUS DOPPLER ULTRASOUND TECHNIQUE: Gray-scale sonography with graded compression, as well as color Doppler and duplex ultrasound were performed to evaluate the lower extremity deep venous systems from the level of the common femoral vein and including the common femoral, femoral, profunda femoral, popliteal and calf veins including the posterior tibial, peroneal and gastrocnemius veins when visible. The superficial great saphenous vein was also interrogated. Spectral Doppler was utilized to evaluate flow at rest and with distal augmentation maneuvers in the common femoral, femoral and popliteal veins. COMPARISON:  None. FINDINGS: Contralateral Common Femoral Vein: Respiratory phasicity is normal and symmetric with the symptomatic side. No evidence of thrombus. Normal compressibility. Common Femoral Vein: No evidence of thrombus. Normal compressibility, respiratory phasicity and response to augmentation. Saphenofemoral Junction: No evidence of thrombus. Normal compressibility and flow on color Doppler imaging. Profunda Femoral Vein: No  evidence of thrombus. Normal compressibility and flow on color Doppler imaging. Femoral Vein: No evidence of thrombus. Normal compressibility, respiratory phasicity and response to augmentation. Popliteal Vein: No evidence of thrombus. Normal compressibility, respiratory phasicity and response to augmentation. Calf Veins: No evidence of thrombus. Normal compressibility and flow on color Doppler imaging. Superficial Great Saphenous Vein: No evidence of thrombus. Normal compressibility and flow on color Doppler imaging. Venous Reflux:  None. Other Findings:  None. IMPRESSION: No evidence of DVT within the left lower extremity. Electronically Signed   By: Jacqulynn Cadet M.D.   On: 04/15/2017 17:28    ASSESSMENT: Clinical stage IB ER/PR positive, HER-2 negative invasive carcinoma of the upper-outer quadrant of the left breast  PLAN:    1. Clinical stage IB  ER/PR positive, HER-2 negative invasive carcinoma of the upper-outer quadrant of the left breast: Given the size of patient's tumor, have recommended neoadjuvant chemotherapy. Once patient completes chemotherapy, she will require lumpectomy or mastectomy. She will then require adjuvant XRT depending on the type of surgery she chooses, although patient has indicated she likely will pursue mastectomy. Finally, patient will require an aromatase inhibitor for 5 years given the ER/PR positivity of her disease. Pretreatment MUGA on February 12, 2017 is adequate to proceed with an EF of 75%. CT of the chest, abdomen, pelvis did not reveal any evidence of metastatic disease. Patient completed cycle 3of Adriamycin and Cytoxan on March 24, 2017 and has elected to discontinue chemotherapy altogether. She has expressed understanding that by not completing treatment as recommended, she is at higher risk for recurrent disease. She has also indicated that she will likely pursue a total mastectomy therefore will not require adjuvant XRT.  A referral has been made back to surgery. Return to clinic in 6 weeks for further evaluation and initiation of an aromatase inhibitor.   2. Rheumatoid arthritis: Patient has discontinued Enbrel since the diagnosis of her malignancy. Since this is a solid tumor, okay to restart from an oncology standpoint once chemotherapy has been completed.  3. Neutropenia: Resolved. 4. Pancytopenia: Resolved.  5. Nausea: Continue Zofran and Compazine as prescribed. 6. Diarrhea: Continue OTC Imodium as needed. 7. Swollen left leg: U/S-doppler negative for DVT.  Patient expressed understanding and was in agreement with this plan. She also understands that She can call clinic at any time with any questions, concerns, or complaints.   Cancer Staging Malignant neoplasm of upper-outer quadrant of left breast in female, estrogen receptor negative ([place]) Staging form: Breast, AJCC 8th Edition - Clinical stage  from 02/04/2017: Stage IB (cT2, cN0, cM0, G2, ER: Positive, PR: Positive, HER2: Negative) - Signed by Lloyd Huger, MD on 02/15/2017   Lloyd Huger, MD   04/17/2017 9:43 PM

## 2017-04-14 ENCOUNTER — Inpatient Hospital Stay (HOSPITAL_BASED_OUTPATIENT_CLINIC_OR_DEPARTMENT_OTHER): Payer: PPO | Admitting: Oncology

## 2017-04-14 ENCOUNTER — Inpatient Hospital Stay: Payer: PPO

## 2017-04-14 VITALS — BP 150/101 | HR 73 | Temp 98.1°F | Resp 20 | Wt 174.1 lb

## 2017-04-14 DIAGNOSIS — C50412 Malignant neoplasm of upper-outer quadrant of left female breast: Secondary | ICD-10-CM

## 2017-04-14 DIAGNOSIS — Z87442 Personal history of urinary calculi: Secondary | ICD-10-CM | POA: Diagnosis not present

## 2017-04-14 DIAGNOSIS — M7989 Other specified soft tissue disorders: Secondary | ICD-10-CM

## 2017-04-14 DIAGNOSIS — M069 Rheumatoid arthritis, unspecified: Secondary | ICD-10-CM

## 2017-04-14 DIAGNOSIS — Z171 Estrogen receptor negative status [ER-]: Principal | ICD-10-CM

## 2017-04-14 DIAGNOSIS — Z87891 Personal history of nicotine dependence: Secondary | ICD-10-CM | POA: Diagnosis not present

## 2017-04-14 DIAGNOSIS — K219 Gastro-esophageal reflux disease without esophagitis: Secondary | ICD-10-CM

## 2017-04-14 DIAGNOSIS — M25472 Effusion, left ankle: Secondary | ICD-10-CM | POA: Diagnosis not present

## 2017-04-14 DIAGNOSIS — M810 Age-related osteoporosis without current pathological fracture: Secondary | ICD-10-CM | POA: Diagnosis not present

## 2017-04-14 DIAGNOSIS — E039 Hypothyroidism, unspecified: Secondary | ICD-10-CM | POA: Diagnosis not present

## 2017-04-14 DIAGNOSIS — M797 Fibromyalgia: Secondary | ICD-10-CM | POA: Diagnosis not present

## 2017-04-14 DIAGNOSIS — Z170 Estrogen receptor positive status [ER+]: Secondary | ICD-10-CM | POA: Diagnosis not present

## 2017-04-14 DIAGNOSIS — R600 Localized edema: Secondary | ICD-10-CM

## 2017-04-14 DIAGNOSIS — Z8049 Family history of malignant neoplasm of other genital organs: Secondary | ICD-10-CM

## 2017-04-14 DIAGNOSIS — Z5111 Encounter for antineoplastic chemotherapy: Secondary | ICD-10-CM | POA: Diagnosis not present

## 2017-04-14 DIAGNOSIS — Z803 Family history of malignant neoplasm of breast: Secondary | ICD-10-CM

## 2017-04-14 DIAGNOSIS — Z79899 Other long term (current) drug therapy: Secondary | ICD-10-CM | POA: Diagnosis not present

## 2017-04-14 DIAGNOSIS — Z95828 Presence of other vascular implants and grafts: Secondary | ICD-10-CM

## 2017-04-14 LAB — CBC WITH DIFFERENTIAL/PLATELET
BASOS ABS: 0.1 10*3/uL (ref 0–0.1)
BASOS PCT: 1 %
EOS ABS: 0 10*3/uL (ref 0–0.7)
Eosinophils Relative: 1 %
HCT: 32.3 % — ABNORMAL LOW (ref 35.0–47.0)
Hemoglobin: 11.1 g/dL — ABNORMAL LOW (ref 12.0–16.0)
Lymphocytes Relative: 18 %
Lymphs Abs: 0.8 10*3/uL — ABNORMAL LOW (ref 1.0–3.6)
MCH: 31.3 pg (ref 26.0–34.0)
MCHC: 34.3 g/dL (ref 32.0–36.0)
MCV: 91.2 fL (ref 80.0–100.0)
MONO ABS: 0.8 10*3/uL (ref 0.2–0.9)
MONOS PCT: 17 %
NEUTROS PCT: 63 %
Neutro Abs: 2.9 10*3/uL (ref 1.4–6.5)
PLATELETS: 323 10*3/uL (ref 150–440)
RBC: 3.54 MIL/uL — ABNORMAL LOW (ref 3.80–5.20)
RDW: 19.7 % — AB (ref 11.5–14.5)
WBC: 4.6 10*3/uL (ref 3.6–11.0)

## 2017-04-14 LAB — COMPREHENSIVE METABOLIC PANEL
ALBUMIN: 3.3 g/dL — AB (ref 3.5–5.0)
ALT: 15 U/L (ref 14–54)
ANION GAP: 6 (ref 5–15)
AST: 26 U/L (ref 15–41)
Alkaline Phosphatase: 77 U/L (ref 38–126)
BUN: 13 mg/dL (ref 6–20)
CHLORIDE: 108 mmol/L (ref 101–111)
CO2: 25 mmol/L (ref 22–32)
Calcium: 8.6 mg/dL — ABNORMAL LOW (ref 8.9–10.3)
Creatinine, Ser: 0.47 mg/dL (ref 0.44–1.00)
GFR calc Af Amer: >60 mL/min (ref >60)
GFR calc non Af Amer: >60 mL/min (ref >60)
GLUCOSE: 97 mg/dL (ref 65–99)
POTASSIUM: 3.5 mmol/L (ref 3.5–5.1)
Sodium: 139 mmol/L (ref 135–145)
TOTAL PROTEIN: 6.3 g/dL — AB (ref 6.5–8.1)
Total Bilirubin: 0.4 mg/dL (ref 0.3–1.2)

## 2017-04-14 MED ORDER — HEPARIN SOD (PORK) LOCK FLUSH 100 UNIT/ML IV SOLN
500.0000 [IU] | Freq: Once | INTRAVENOUS | Status: AC
  Administered 2017-04-14: 500 [IU] via INTRAVENOUS

## 2017-04-14 NOTE — Progress Notes (Signed)
Patient denies any concerns today.  

## 2017-04-15 ENCOUNTER — Ambulatory Visit
Admission: RE | Admit: 2017-04-15 | Discharge: 2017-04-15 | Disposition: A | Discharge status: Home / Self Care | Payer: PPO | Source: Ambulatory Visit | Attending: Oncology | Admitting: Oncology

## 2017-04-15 DIAGNOSIS — M7989 Other specified soft tissue disorders: Secondary | ICD-10-CM | POA: Diagnosis not present

## 2017-04-15 DIAGNOSIS — C50412 Malignant neoplasm of upper-outer quadrant of left female breast: Secondary | ICD-10-CM | POA: Insufficient documentation

## 2017-04-15 DIAGNOSIS — Z171 Estrogen receptor negative status [ER-]: Secondary | ICD-10-CM | POA: Diagnosis not present

## 2017-04-27 NOTE — Progress Notes (Signed)
Office Visit Note  Patient: Samantha Valentine             Date of Birth: 1945/12/05           MRN: [id number]             PCP: Abner Greenspan, MD Referring: Tower, Wynelle Fanny, MD Visit Date: 04/30/2017 Occupation: @GUAROCC @    Subjective:  Joint stiffness   History of Present Illness: Samantha Valentine is a 71 y.o. female with history of sero positive rheumatoid arthritis. She's been off Enbrel due to in eventual failure of Enbrel. She was also diagnosed with breast cancer in April 2018. She has had 3 courses of Adriamycin and Cytoxan. She has decided not to take any further treatment with chemotherapy due to side effects. She is planning to undergo bilateral mastectomy. She states she's been having some discomfort in her neck. She has not had any flare of rheumatoid arthritis that she's been on chemotherapy. She wants to know future treatment choices for her rheumatoid arthritis and possible osteoporosis. She is scheduled to get a bone density near future. He  Activities of Daily Living:  Patient reports morning stiffness for 1 hour.   Patient Reports nocturnal pain.  Difficulty dressing/grooming: Denies Difficulty climbing stairs: Reports Difficulty getting out of chair: Reports Difficulty using hands for taps, buttons, cutlery, and/or writing: Denies   Review of Systems  Constitutional: Positive for fatigue. Negative for night sweats, weight gain, weight loss and weakness.  HENT: Positive for mouth dryness. Negative for mouth sores, trouble swallowing, trouble swallowing and nose dryness.   Eyes: Positive for dryness. Negative for pain, redness and visual disturbance.  Respiratory: Negative for cough, shortness of breath and difficulty breathing.   Cardiovascular: Negative for chest pain, palpitations, hypertension, irregular heartbeat and swelling in legs/feet.  Gastrointestinal: Negative for blood in stool, constipation and diarrhea.  Endocrine: Negative for increased  urination.  Genitourinary: Negative for vaginal dryness.  Musculoskeletal: Positive for arthralgias, joint pain and morning stiffness. Negative for joint swelling, myalgias, muscle weakness, muscle tenderness and myalgias.  Skin: Positive for hair loss. Negative for color change, rash, skin tightness, ulcers and sensitivity to sunlight.  Allergic/Immunologic: Negative for susceptible to infections.  Neurological: Negative for dizziness, memory loss and night sweats.  Hematological: Negative for swollen glands.  Psychiatric/Behavioral: Positive for depressed mood and sleep disturbance. The patient is nervous/anxious.     PMFS History:  Patient Active Problem List   Diagnosis Date Noted  . Estrogen deficiency 03/22/2017  . Neutropenic fever ([place]) 03/10/2017  . Malignant neoplasm of upper-outer quadrant of left breast in female, estrogen receptor negative ([place]) 02/04/2017  . Tendinopathy of right shoulder 01/25/2017  . Breast lump in female 01/04/2017  . High risk medication use 09/29/2016  . DJD (degenerative joint disease), cervical 09/29/2016  . History of right hip replacement 09/29/2016  . Eczema 07/13/2014  . Adverse effect of immunosuppressive drug 04/27/2012  . ANXIETY DEPRESSION 10/28/2008  . Spinal stenosis 12/29/2007  . Hypothyroidism 12/28/2007  . Vitamin D deficiency 12/28/2007  . HEARING LOSS 12/28/2007  . Seropositive rheumatoid arthritis of multiple sites ([place]) 12/28/2007  . DDD (degenerative disc disease), lumbar 12/28/2007  . Fibromyalgia 12/28/2007  . Osteoporosis 12/28/2007  . MIGRAINES, HX OF 12/28/2007    Past Medical History:  Diagnosis Date  . Arthritis    RA  . Breast mass 1 year   . Cancer ([place]) 01/29/2017   left breast INVASIVE MAMMARY CARCINOMA, ER/PR positive  . Collagen  vascular disease ([place])    Rhematoid Arthritis  . DDD (degenerative disc disease)    in neck  . Depression   . Fibromyalgia   . GERD (gastroesophageal reflux disease)    NO  MEDS  . History of kidney stones    MULTIPLE KIDNEY STONES BIL  . Hypothyroidism   . Migraine   . Osteoporosis   . Rheumatoid arthritis ([place])     Family History  Problem Relation Age of Onset  . Alcohol abuse Father   . Diabetes Father   . Stroke Father   . Hypertension Mother   . Endometrial cancer Mother   . Osteoarthritis Mother   . Breast cancer Paternal Aunt 31  . Liver disease Sister   . Breast cancer Maternal Aunt   . Rheum arthritis Maternal Grandmother   . Diabetes Paternal Grandmother   . Parkinson's disease Paternal Grandfather    Past Surgical History:  Procedure Laterality Date  . BREAST BIOPSY Left 01/29/2017   US guided biopsy INVASIVE MAMMARY CARCINOMA  . JOINT REPLACEMENT  2003   total hip replacement  . LITHOTRIPSY  1997   kidney stone  . PORTACATH PLACEMENT Right 02/15/2017   Procedure: INSERTION PORT-A-CATH;  Surgeon: Robert Bellow, MD;  Location: [facility] ORS;  Service: General;  Laterality: Right;  . TONSILLECTOMY  1970   Social History   Social History Narrative  . No narrative on file     Objective: Vital Signs: BP (!) 145/75 (BP Location: Left Arm, Patient Position: Sitting, Cuff Size: Normal)   Pulse 99   Resp 17   Ht 5\' 4"  (1.626 m)   Wt 173 lb (78.5 kg)   BMI 29.70 kg/m    Physical Exam  Constitutional: She is oriented to person, place, and time. She appears well-developed and well-nourished.  HENT:  Head: Normocephalic and atraumatic.  Eyes: Conjunctivae and EOM are normal.  Neck: Normal range of motion.  Cardiovascular: Normal rate, regular rhythm, normal heart sounds and intact distal pulses.   Pulmonary/Chest: Effort normal and breath sounds normal.  Abdominal: Soft. Bowel sounds are normal.  Lymphadenopathy:    She has no cervical adenopathy.  Neurological: She is alert and oriented to person, place, and time.  Skin: Skin is warm and dry. Capillary refill takes less than 2 seconds.  Psychiatric: She has a normal mood and  affect. Her behavior is normal.  Nursing note and vitals reviewed.    Musculoskeletal Exam: C-spine images range of motion with some discomfort in his right lateral rotation. Thoracic and lumbar spine good range of motion. Shoulder joints elbow joints wrist joints are good range of motion. She has some synovial thickening over her MCP joints and MTPs joints but no synovitis was noted. Hip joints knee joints ankles MTPs PIPs with good range of motion with no synovitis.  CDAI Exam: CDAI Homunculus Exam:   Joint Counts:  CDAI Tender Joint count: 0 CDAI Swollen Joint count: 0  Global Assessments:  Patient Global Assessment: 3 Provider Global Assessment: 3  CDAI Calculated Score: 6    Investigation: No additional findings. CBC Latest Ref Rng & Units 04/14/2017 03/24/2017 03/17/2017  WBC 3.6 - 11.0 K/uL 4.6 10.0 7.9  Hemoglobin 12.0 - 16.0 g/dL 11.1(L) 11.9(L) 11.4(L)  Hematocrit 35.0 - 47.0 % 32.3(L) 34.7(L) 33.5(L)  Platelets 150 - 440 K/uL 323 391 315   CMP Latest Ref Rng & Units 04/14/2017 03/24/2017 03/17/2017  Glucose 65 - 99 mg/dL 97 95 139(H)  BUN 6 - 20 mg/dL  13 18 12   Creatinine 0.44 - 1.00 mg/dL 0.47 0.65 0.60  Sodium 135 - 145 mmol/L 139 135 138  Potassium 3.5 - 5.1 mmol/L 3.5 4.1 3.4(L)  Chloride 101 - 111 mmol/L 108 104 108  CO2 22 - 32 mmol/L 25 24 22   Calcium 8.9 - 10.3 mg/dL 8.6(L) 8.9 8.5(L)  Total Protein 6.5 - 8.1 g/dL 6.3(L) 6.7 6.2(L)  Total Bilirubin 0.3 - 1.2 mg/dL 0.4 0.4 0.3  Alkaline Phos 38 - 126 U/L 77 75 83  AST 15 - 41 U/L 26 44(H) 30  ALT 14 - 54 U/L 15 17 19     Imaging: US Venous Img Lower Unilateral Left  Result Date: 04/15/2017 CLINICAL DATA:  71 year old female with left lower extremity swelling EXAM: LEFT LOWER EXTREMITY VENOUS DOPPLER ULTRASOUND TECHNIQUE: Gray-scale sonography with graded compression, as well as color Doppler and duplex ultrasound were performed to evaluate the lower extremity deep venous systems from the level of the common  femoral vein and including the common femoral, femoral, profunda femoral, popliteal and calf veins including the posterior tibial, peroneal and gastrocnemius veins when visible. The superficial great saphenous vein was also interrogated. Spectral Doppler was utilized to evaluate flow at rest and with distal augmentation maneuvers in the common femoral, femoral and popliteal veins. COMPARISON:  None. FINDINGS: Contralateral Common Femoral Vein: Respiratory phasicity is normal and symmetric with the symptomatic side. No evidence of thrombus. Normal compressibility. Common Femoral Vein: No evidence of thrombus. Normal compressibility, respiratory phasicity and response to augmentation. Saphenofemoral Junction: No evidence of thrombus. Normal compressibility and flow on color Doppler imaging. Profunda Femoral Vein: No evidence of thrombus. Normal compressibility and flow on color Doppler imaging. Femoral Vein: No evidence of thrombus. Normal compressibility, respiratory phasicity and response to augmentation. Popliteal Vein: No evidence of thrombus. Normal compressibility, respiratory phasicity and response to augmentation. Calf Veins: No evidence of thrombus. Normal compressibility and flow on color Doppler imaging. Superficial Great Saphenous Vein: No evidence of thrombus. Normal compressibility and flow on color Doppler imaging. Venous Reflux:  None. Other Findings:  None. IMPRESSION: No evidence of DVT within the left lower extremity. Electronically Signed   By: Jacqulynn Cadet M.D.   On: 04/15/2017 17:28    Speciality Comments: No specialty comments available.    Procedures:  No procedures performed Allergies: Hydroxychloroquine sulfate; Ibandronate sodium; and Risedronate sodium   Assessment / Plan:     Visit Diagnoses: Seropositive rheumatoid arthritis of multiple sites ([place]) - +RF, erosive disease. Patient has been off Enbrel as eventually to stop working for her. The plan is to start her on  methotrexate last visit as she was diagnosed with breast cancer. She is generally well with chemotherapy and her rheumatoid arthritis is not active currently. Although her last chemotherapy was about 5 weeks ago and she is having some arthralgias. She wanted to discuss different treatment options. She's not interested in taking methotrexate. Different treatment options and their side effects were discussed at length. She wants to do some reading on these medications before starting any of the medications. I've given her a handout on sulfasalazine and Arava to review.  High risk medication use - MTX was prescribed during the last visit on 01/28/2017. Treated with Enbrel for multiple years and discontinued due to eventual failure.  Tendinopathy of right shoulder: Minimal discomfort  History of total hip arthroplasty, right: Doing fairly well  DDD (degenerative disc disease), cervical: She continues to have some pain. She states she does really well on Celebrex which  she's not taken in long time and would like to restart on it. Indications side effects contraindications were discussed. She was given Celebrex 200 mg by mouth twice a day when necessary total 90 days supply was given. I will check her repeat labs when she comes back.  DDD (degenerative disc disease), lumbar: Chronic pain  Vitamin D deficiency: She's been taking calcium and vitamin D  Osteopenia of multiple sites - DXA 2014. She will get repeat bone density with her PCP  Fibromyalgia: Generalized pain  History of breast cancer - Treated with Adriamycin and Cytoxan. Patient decided to stop the chemotherapy. She states that she does not want any further treatment for cancer. She will undergo bilateral mastectomy. Per oncology patient may start BDMARDS after chemotherapy.  History of migraine  History of hypothyroidism  ANXIETY DEPRESSION    Orders: No orders of the defined types were placed in this encounter.  Meds ordered this  encounter  Medications  . celecoxib (CELEBREX) 200 MG capsule    Sig: 1 capsule by mouth twice a day when necessary with meals    Dispense:  135 capsule    Refill:  0    Face-to-face time spent with patient was 30 minutes. 50% of time was spent in counseling and coordination of care.  Follow-Up Instructions: Return in about 3 months (around 07/31/2017) for Rheumatoid arthritis.   Bo Merino, MD  Note - This record has been created using Editor, commissioning.  Chart creation errors have been sought, but may not always  have been located. Such creation errors do not reflect on  the standard of medical care.

## 2017-04-30 ENCOUNTER — Encounter: Payer: Self-pay | Admitting: Rheumatology

## 2017-04-30 ENCOUNTER — Ambulatory Visit (INDEPENDENT_AMBULATORY_CARE_PROVIDER_SITE_OTHER): Payer: PPO | Admitting: Rheumatology

## 2017-04-30 VITALS — BP 145/75 | HR 99 | Resp 17 | Ht 64.0 in | Wt 173.0 lb

## 2017-04-30 DIAGNOSIS — M67911 Unspecified disorder of synovium and tendon, right shoulder: Secondary | ICD-10-CM

## 2017-04-30 DIAGNOSIS — M8589 Other specified disorders of bone density and structure, multiple sites: Secondary | ICD-10-CM | POA: Diagnosis not present

## 2017-04-30 DIAGNOSIS — Z8639 Personal history of other endocrine, nutritional and metabolic disease: Secondary | ICD-10-CM | POA: Diagnosis not present

## 2017-04-30 DIAGNOSIS — Z8669 Personal history of other diseases of the nervous system and sense organs: Secondary | ICD-10-CM | POA: Diagnosis not present

## 2017-04-30 DIAGNOSIS — F341 Dysthymic disorder: Secondary | ICD-10-CM

## 2017-04-30 DIAGNOSIS — M5136 Other intervertebral disc degeneration, lumbar region: Secondary | ICD-10-CM | POA: Diagnosis not present

## 2017-04-30 DIAGNOSIS — M797 Fibromyalgia: Secondary | ICD-10-CM | POA: Diagnosis not present

## 2017-04-30 DIAGNOSIS — E559 Vitamin D deficiency, unspecified: Secondary | ICD-10-CM

## 2017-04-30 DIAGNOSIS — Z96641 Presence of right artificial hip joint: Secondary | ICD-10-CM | POA: Diagnosis not present

## 2017-04-30 DIAGNOSIS — Z853 Personal history of malignant neoplasm of breast: Secondary | ICD-10-CM | POA: Diagnosis not present

## 2017-04-30 DIAGNOSIS — Z79899 Other long term (current) drug therapy: Secondary | ICD-10-CM | POA: Diagnosis not present

## 2017-04-30 DIAGNOSIS — M5030 Other cervical disc degeneration, unspecified cervical region: Secondary | ICD-10-CM | POA: Diagnosis not present

## 2017-04-30 DIAGNOSIS — M0579 Rheumatoid arthritis with rheumatoid factor of multiple sites without organ or systems involvement: Secondary | ICD-10-CM

## 2017-04-30 MED ORDER — CELECOXIB 200 MG PO CAPS: ORAL_CAPSULE | ORAL | 0 refills | Status: DC

## 2017-04-30 NOTE — Patient Instructions (Signed)
Sulfasalazine delayed release tablets What is this medicine? SULFASALAZINE (sul fa SAL a zeen) is for ulcerative colitis and certain types of rheumatoid arthritis. This medicine may be used for other purposes; ask your health care provider or pharmacist if you have questions. COMMON BRAND NAME(S): Azulfidine En-Tabs, Sulfazine EC What should I tell my health care provider before I take this medicine? They need to know if you have any of these conditions: -asthma -blood disorders or anemia -glucose-6-phosphate dehydrogenase (G6PD) deficiency -intestinal obstruction -kidney disease -liver disease -porphyria -urinary tract obstruction -an unusual reaction to sulfasalazine, sulfa drugs, salicylates, other medicines, foods, dyes, or preservatives -pregnant or trying to get pregnant -breast-feeding How should I use this medicine? Take this medicine by mouth with a full glass of water. Follow the directions on the prescription label. If the medicine upsets your stomach, take it with food or milk. Do not crush or chew the tablets. Swallow the tablets whole. Take your medicine at regular intervals. Do not take your medicine more often than directed. Do not stop taking except on your doctor's advice. Talk to your pediatrician regarding the use of this medicine in children. Special care may be needed. While this drug may be prescribed for children as young as 6 years for selected conditions, precautions do apply. Patients over 26 years old may have a stronger reaction and need a smaller dose. Overdosage: If you think you have taken too much of this medicine contact a poison control center or emergency room at once. NOTE: This medicine is only for you. Do not share this medicine with others. What if I miss a dose? If you miss a dose, take it as soon as you can. If it is almost time for your next dose, take only that dose. Do not take double or extra doses. What may interact with this  medicine? -digoxin -folic acid This list may not describe all possible interactions. Give your health care provider a list of all the medicines, herbs, non-prescription drugs, or dietary supplements you use. Also tell them if you smoke, drink alcohol, or use illegal drugs. Some items may interact with your medicine. What should I watch for while using this medicine? Visit your doctor or health care professional for regular checks on your progress. You will need frequent blood and urine checks. This medicine can make you more sensitive to the sun. Keep out of the sun. If you cannot avoid being in the sun, wear protective clothing and use sunscreen. Do not use sun lamps or tanning beds/booths. Drink plenty of water while taking this medicine. Tell your doctor if you see the tablet in your stools. Your body may not be absorbing the medicine. What side effects may I notice from receiving this medicine? Side effects that you should report to your doctor or health care professional as soon as possible: -allergic reactions like skin rash, itching or hives, swelling of the face, lips, or tongue -fever, chills, or any other sign of infection -painful, difficult, or reduced urination -redness, blistering, peeling or loosening of the skin, including inside the mouth -severe stomach pain -unusual bleeding or bruising -unusually weak or tired -yellowing of the skin or eyes Side effects that usually do not require medical attention (report to your doctor or health care professional if they continue or are bothersome): -headache -loss of appetite -nausea, vomiting -orange color to the urine -reduced sperm count This list may not describe all possible side effects. Call your doctor for medical advice about side effects. You  may report side effects to FDA at 1-800-FDA-1088. Where should I keep my medicine? Keep out of the reach of children. Store at room temperature between 15 and 30 degrees C (59 and 86  degrees F). Keep container tightly closed. Throw away any unused medicine after the expiration date. NOTE: This sheet is a summary. It may not cover all possible information. If you have questions about this medicine, talk to your doctor, pharmacist, or health care provider.  2018 Elsevier/Gold Standard (2008-06-08 13:02:26) Leflunomide tablets What is this medicine? LEFLUNOMIDE (le FLOO na mide) is for rheumatoid arthritis. This medicine may be used for other purposes; ask your health care provider or pharmacist if you have questions. COMMON BRAND NAME(S): Arava What should I tell my health care provider before I take this medicine? They need to know if you have any of these conditions: -alcoholism -bone marrow problems -fever or infection -immune system problems -kidney disease -liver disease -an unusual or allergic reaction to leflunomide, teriflunomide, other medicines, lactose, foods, dyes, or preservatives -pregnant or trying to get pregnant -breast-feeding How should I use this medicine? Take this medicine by mouth with a full glass of water. Follow the directions on the prescription label. Take your medicine at regular intervals. Do not take your medicine more often than directed. Do not stop taking except on your doctor's advice. Talk to your pediatrician regarding the use of this medicine in children. Special care may be needed. Overdosage: If you think you have taken too much of this medicine contact a poison control center or emergency room at once. NOTE: This medicine is only for you. Do not share this medicine with others. What if I miss a dose? If you miss a dose, take it as soon as you can. If it is almost time for your next dose, take only that dose. Do not take double or extra doses. What may interact with this medicine? Do not take this medicine with any of the following medications: -teriflunomide This medicine may also interact with the following  medications: -charcoal -cholestyramine -methotrexate -NSAIDs, medicines for pain and inflammation, like ibuprofen or naproxen -phenytoin -rifampin -tolbutamide -vaccines -warfarin This list may not describe all possible interactions. Give your health care provider a list of all the medicines, herbs, non-prescription drugs, or dietary supplements you use. Also tell them if you smoke, drink alcohol, or use illegal drugs. Some items may interact with your medicine. What should I watch for while using this medicine? Visit your doctor or health care professional for regular checks on your progress. You will need frequent blood checks while you are receiving the medicine. If you get a cold or other infection while receiving this medicine, call your doctor or health care professional. Do not treat yourself. The medicine may increase your risk of getting an infection. If you are a woman who has the potential to become pregnant, discuss birth control options with your doctor or health care professional. Dennis Bast must not be pregnant, and you must be using a reliable form of birth control. The medicine may harm an unborn baby. Immediately call your doctor if you think you might be pregnant. Alcoholic drinks may increase possible damage to your liver. Do not drink alcohol while taking this medicine. What side effects may I notice from receiving this medicine? Side effects that you should report to your doctor or health care professional as soon as possible: -allergic reactions like skin rash, itching or hives, swelling of the face, lips, or tongue -cough -difficulty breathing  or shortness of breath -fever, chills or any other sign of infection -redness, blistering, peeling or loosening of the skin, including inside the mouth -unusual bleeding or bruising -unusually weak or tired -vomiting -yellowing of eyes or skin Side effects that usually do not require medical attention (report to your doctor or health  care professional if they continue or are bothersome): -diarrhea -hair loss -headache -nausea This list may not describe all possible side effects. Call your doctor for medical advice about side effects. You may report side effects to FDA at 1-800-FDA-1088. Where should I keep my medicine? Keep out of the reach of children. Store at room temperature between 15 and 30 degrees C (59 and 86 degrees F). Protect from moisture and light. Throw away any unused medicine after the expiration date. NOTE: This sheet is a summary. It may not cover all possible information. If you have questions about this medicine, talk to your doctor, pharmacist, or health care provider.  2018 Elsevier/Gold Standard (2013-10-03 10:53:11)

## 2017-05-06 ENCOUNTER — Ambulatory Visit
Admission: RE | Admit: 2017-05-06 | Discharge: 2017-05-06 | Disposition: A | Discharge status: Home / Self Care | Payer: PPO | Source: Ambulatory Visit | Attending: Family Medicine | Admitting: Family Medicine

## 2017-05-06 DIAGNOSIS — M85832 Other specified disorders of bone density and structure, left forearm: Secondary | ICD-10-CM | POA: Insufficient documentation

## 2017-05-06 DIAGNOSIS — M85852 Other specified disorders of bone density and structure, left thigh: Secondary | ICD-10-CM | POA: Diagnosis not present

## 2017-05-06 DIAGNOSIS — E2839 Other primary ovarian failure: Secondary | ICD-10-CM | POA: Insufficient documentation

## 2017-05-06 DIAGNOSIS — M8589 Other specified disorders of bone density and structure, multiple sites: Secondary | ICD-10-CM | POA: Diagnosis not present

## 2017-05-06 DIAGNOSIS — Z780 Asymptomatic menopausal state: Secondary | ICD-10-CM | POA: Diagnosis not present

## 2017-05-13 ENCOUNTER — Encounter: Payer: Self-pay | Admitting: General Surgery

## 2017-05-13 ENCOUNTER — Ambulatory Visit (INDEPENDENT_AMBULATORY_CARE_PROVIDER_SITE_OTHER): Payer: PPO | Admitting: General Surgery

## 2017-05-13 ENCOUNTER — Telehealth: Payer: Self-pay | Admitting: Family Medicine

## 2017-05-13 ENCOUNTER — Inpatient Hospital Stay: Payer: Self-pay

## 2017-05-13 VITALS — BP 146/82 | HR 113 | Resp 16 | Ht 64.0 in | Wt 175.0 lb

## 2017-05-13 DIAGNOSIS — Z171 Estrogen receptor negative status [ER-]: Principal | ICD-10-CM

## 2017-05-13 DIAGNOSIS — C50412 Malignant neoplasm of upper-outer quadrant of left female breast: Secondary | ICD-10-CM | POA: Diagnosis not present

## 2017-05-13 NOTE — Telephone Encounter (Signed)
Pt declined AWV at this time. Going through chemo and having surgery in a couple of weeks. Pt will call back when she is ready to schedule.

## 2017-05-13 NOTE — Progress Notes (Signed)
Patient ID: Samantha Valentine, female   DOB: 1946-06-01, 71 y.o.   MRN: [id number]  Chief Complaint  Patient presents with  . Follow-up    breast cancer    HPI Samantha Valentine is a 71 y.o. female.  Here today for follow up breast cancer. She states she stopped chemotherapy 03-24-17 3 of 4 because she felt like she had more issues than fighting cancer. She states with the RA she was having pains. She wants to talk about having a left mastectomy. She feels like the left breast mass is smaller than before chemotherapy.  Bone density 05-06-17. She is here with her friend Harden Mo.  HPI  Past Medical History:  Diagnosis Date  . Arthritis    RA  . Breast mass 1 year   . Cancer ([place]) 01/29/2017   left breast INVASIVE MAMMARY CARCINOMA, ER/PR positive  . Collagen vascular disease (HCC)    Rhematoid Arthritis  . DDD (degenerative disc disease)    in neck  . Depression   . Fibromyalgia   . GERD (gastroesophageal reflux disease)    NO MEDS  . History of kidney stones    MULTIPLE KIDNEY STONES BIL  . Hypothyroidism   . Migraine   . Osteoporosis   . Rheumatoid arthritis [facility])     Past Surgical History:  Procedure Laterality Date  . BREAST BIOPSY Left 01/29/2017   US guided biopsy INVASIVE MAMMARY CARCINOMA  . JOINT REPLACEMENT  2003   total hip replacement  . LITHOTRIPSY  1997   kidney stone  . PORTACATH PLACEMENT Right 02/15/2017   Procedure: INSERTION PORT-A-CATH;  Surgeon: Robert Bellow, MD;  Location: [facility] ORS;  Service: General;  Laterality: Right;  . TONSILLECTOMY  1970    Family History  Problem Relation Age of Onset  . Alcohol abuse Father   . Diabetes Father   . Stroke Father   . Hypertension Mother   . Endometrial cancer Mother   . Osteoarthritis Mother   . Breast cancer Paternal Aunt 72  . Liver disease Sister   . Breast cancer Maternal Aunt   . Rheum arthritis Maternal Grandmother   . Diabetes Paternal Grandmother   . Parkinson's disease Paternal  Grandfather     Social History Social History  Substance Use Topics  . Smoking status: Former Smoker    Packs/day: 1.50    Years: 25.00    Types: Cigarettes    Quit date: 10/20/1983  . Smokeless tobacco: Never Used  . Alcohol use No    Allergies  Allergen Reactions  . Hydroxychloroquine Sulfate     REACTION: rash  . Ibandronate Sodium     REACTION: rash  . Risedronate Sodium     REACTION: rash    Current Outpatient Prescriptions  Medication Sig Dispense Refill  . ALPRAZolam (XANAX) 0.25 MG tablet Take 1 tablet (0.25 mg total) by mouth 2 (two) times daily as needed for anxiety. 60 tablet 0  . celecoxib (CELEBREX) 200 MG capsule 1 capsule by mouth twice a day when necessary with meals 135 capsule 0  . levothyroxine (SYNTHROID, LEVOTHROID) 137 MCG tablet Take 1 tablet (137 mcg total) by mouth daily before breakfast. 30 tablet 11  . lidocaine-prilocaine (EMLA) cream Apply to affected area once 30 g 3  . ondansetron (ZOFRAN) 8 MG tablet Take 1 tablet (8 mg total) by mouth 2 (two) times daily as needed. 60 tablet 2  . prochlorperazine (COMPAZINE) 10 MG tablet Take 1 tablet (10 mg total) by mouth  every 6 (six) hours as needed (Nausea or vomiting). 60 tablet 2   No current facility-administered medications for this visit.     Review of Systems Review of Systems  Constitutional: Negative.   Respiratory: Negative.   Cardiovascular: Negative.     Blood pressure (!) 146/82, pulse (!) 113, resp. rate 16, height 5' 4" (1.626 m), weight 175 lb (79.4 kg).  Physical Exam Physical Exam  Constitutional: She is oriented to person, place, and time. She appears well-developed and well-nourished.  HENT:  Mouth/Throat: Oropharynx is clear and moist.  Eyes: Conjunctivae are normal. No scleral icterus.  Neck: Neck supple.  Cardiovascular: Normal rate, regular rhythm and normal heart sounds.   Pulmonary/Chest: Effort normal and breath sounds normal. Right breast exhibits no inverted nipple,  no mass, no nipple discharge, no skin change and no tenderness. Left breast exhibits mass. Left breast exhibits no inverted nipple, no nipple discharge, no skin change and no tenderness.    Right chest port present, well healed incision. Left breast   Lymphadenopathy:    She has no cervical adenopathy.    She has no axillary adenopathy.       Left: No supraclavicular adenopathy present.  Neurological: She is alert and oriented to person, place, and time.  Skin: Skin is warm and dry.  Psychiatric: Her behavior is normal.    Data Reviewed Ultrasound examination of the left breast showed a significant decrease in size of the dominant mass to 1.5 cm in maximum dimension. This is about a 50-60% reduction in volume compared to pre-chemotherapy measurements. Examination of the left axilla shows a single prominent node in the lower aspect of the axilla measuring up to 1.03 cm. Cortical thickening is modest to 0.2 mm, previously 5 mm suggesting this was a node involved with malignancy. BI-RADS-6.  Assessment    Good response to neoadjuvant chemotherapy, 3 of 4 cycles completed.  ER positive, PR positive, HER-2/neu negative primary.    Plan    When the patient was asked about her desires, she brought up bilateral mastectomy. We reviewed the information provided by the [employer] Society [employer] discouraging contralateral prophylactic mastectomy is a provides no survival benefit.  In view of this, the discussion turned towards breast conservation versus mastectomy. The patient is adverse to radiation therapy, and I think in light of the neoadjuvant status and the previously prominent axillary node, she would likely be a candidate for radiation therapy if breast conservation was completed or if sentinel node biopsy showed multiple nodes.  I recommended that she defer any action involving the right breast at this time, and if she desires to avoid radiation if possible, we'll consider a left  simple mastectomy with sentinel node biopsy area intraoperative assessment would be completed and if sentinel nodes were positive, formal axillary dissection would be completed. The risks of lymphedema were reviewed.  At this time the patient's amenable to proceed in this pathway and arrangements will be made to schedule surgery at a convenient date.  As previously noted, there is no contraindication to resumption of Biologics for management of her rheumatoid arthritis in the postoperative time frame.  While the patient completed all the 3 of for chemotherapy regimens, I would like to defer removal of her PowerPort until after surgical review of the pathology in the unlikely event there would be a recommendation for additional treatment.      HPI, Physical Exam, Assessment and Plan have been scribed under the direction and in the presence of  Robert Bellow, MD.  Karie Fetch, RN  I have completed the exam and reviewed the above documentation for accuracy and completeness.  I agree with the above.  Haematologist has been used and any errors in dictation or transcription are unintentional.  Hervey Ard, M.D., F.A.C.S.   Robert Bellow 05/13/2017, 9:11 PM  Patient's surgery has been scheduled for 05-18-17 at [facility].   Dominga Ferry, CMA

## 2017-05-13 NOTE — Patient Instructions (Signed)
The patient is aware to call back for any questions or concerns.  

## 2017-05-14 ENCOUNTER — Other Ambulatory Visit: Payer: Self-pay

## 2017-05-14 ENCOUNTER — Telehealth: Payer: Self-pay

## 2017-05-14 NOTE — Telephone Encounter (Signed)
Call to patient to notify her of surgery arrival time and location. The patient will report to the Radiology desk at [facility] on 05/18/17 at 8:15 am. She is aware of date, time, and instructions.

## 2017-05-17 ENCOUNTER — Encounter
Admission: RE | Admit: 2017-05-17 | Discharge: 2017-05-17 | Disposition: A | Discharge status: Home / Self Care | Payer: PPO | Source: Ambulatory Visit | Attending: General Surgery | Admitting: General Surgery

## 2017-05-17 NOTE — Patient Instructions (Signed)
  Your procedure is scheduled on: 05-18-17 Report to [place] @ 8:15 AM  Remember: Instructions that are not followed completely may result in serious medical risk, up to and including death, or upon the discretion of your surgeon and anesthesiologist your surgery may need to be rescheduled.    _x___ 1. Do not eat food or drink liquids after midnight. No gum chewing or                              hard candies.     __x__ 2. No Alcohol for 24 hours before or after surgery.   __x__3. No Smoking for 24 prior to surgery.   ____  4. Bring all medications with you on the day of surgery if instructed.    __x__ 5. Notify your doctor if there is any change in your medical condition     (cold, fever, infections).     Do not wear jewelry, make-up, hairpins, clips or nail polish.  Do not wear lotions, powders, or perfumes. You may wear deodorant.  Do not shave 48 hours prior to surgery. Men may shave face and neck.  Do not bring valuables to the hospital.    [facility] is not responsible for any belongings or valuables.               Contacts, dentures or bridgework may not be worn into surgery.  Leave your suitcase in the car. After surgery it may be brought to your room.  For patients admitted to the hospital, discharge time is determined by your                       treatment team.   Patients discharged the day of surgery will not be allowed to drive home.  You will need someone to drive you home and stay with you the night of your procedure.    Please read over the following fact sheets that you were given:   [facility] Preparing for Surgery and or MRSA Information   _x___ Take anti-hypertensive (unless it includes a diuretic), cardiac, seizure, asthma,     anti-reflux and psychiatric medicines. These include:  1. LEVOTHYROXINE  2. MAY TAKE XANAX IF NEEDED  3.  4.  5.  6.  ____Fleets enema or Magnesium Citrate as directed.   ____ Use CHG Soap or sage wipes as  directed on instruction sheet   ____ Use inhalers on the day of surgery and bring to hospital day of surgery  ____ Stop Metformin and Janumet 2 days prior to surgery.    ____ Take 1/2 of usual insulin dose the night before surgery and none on the morning     surgery.   _x___ Follow recommendations from Cardiologist, Pulmonologist or PCP regarding          stopping Aspirin, Coumadin, Pllavix ,Eliquis, Effient, or Pradaxa, and Pletal-PT STATES SHE STOPPED ASA LAST WEEK  ____Stop Anti-inflammatories such as Advil, Aleve, Ibuprofen, Motrin, Naproxen, Naprosyn, Goodies powders or aspirin products. OK to take Tylenol   ____ Stop supplements until after surgery.  But may continue Vitamin D, Vitamin B,       and multivitamin.   ____ Bring C-Pap to the hospital.

## 2017-05-18 ENCOUNTER — Ambulatory Visit: Payer: PPO | Admitting: Anesthesiology

## 2017-05-18 ENCOUNTER — Encounter
Admission: RE | Disposition: A | Discharge status: Home / Self Care | Payer: Self-pay | Source: Ambulatory Visit | Attending: General Surgery

## 2017-05-18 ENCOUNTER — Ambulatory Visit
Admission: RE | Admit: 2017-05-18 | Discharge: 2017-05-18 | Disposition: A | Discharge status: Home / Self Care | Payer: PPO | Source: Ambulatory Visit | Attending: General Surgery | Admitting: General Surgery

## 2017-05-18 ENCOUNTER — Encounter: Payer: Self-pay | Admitting: *Deleted

## 2017-05-18 DIAGNOSIS — Z79899 Other long term (current) drug therapy: Secondary | ICD-10-CM | POA: Diagnosis not present

## 2017-05-18 DIAGNOSIS — K219 Gastro-esophageal reflux disease without esophagitis: Secondary | ICD-10-CM | POA: Insufficient documentation

## 2017-05-18 DIAGNOSIS — Z8049 Family history of malignant neoplasm of other genital organs: Secondary | ICD-10-CM | POA: Diagnosis not present

## 2017-05-18 DIAGNOSIS — Z171 Estrogen receptor negative status [ER-]: Principal | ICD-10-CM

## 2017-05-18 DIAGNOSIS — Z9221 Personal history of antineoplastic chemotherapy: Secondary | ICD-10-CM | POA: Diagnosis not present

## 2017-05-18 DIAGNOSIS — Z8261 Family history of arthritis: Secondary | ICD-10-CM | POA: Diagnosis not present

## 2017-05-18 DIAGNOSIS — C50412 Malignant neoplasm of upper-outer quadrant of left female breast: Secondary | ICD-10-CM

## 2017-05-18 DIAGNOSIS — Z8249 Family history of ischemic heart disease and other diseases of the circulatory system: Secondary | ICD-10-CM | POA: Diagnosis not present

## 2017-05-18 DIAGNOSIS — M069 Rheumatoid arthritis, unspecified: Secondary | ICD-10-CM | POA: Insufficient documentation

## 2017-05-18 DIAGNOSIS — M797 Fibromyalgia: Secondary | ICD-10-CM | POA: Insufficient documentation

## 2017-05-18 DIAGNOSIS — Z96649 Presence of unspecified artificial hip joint: Secondary | ICD-10-CM | POA: Insufficient documentation

## 2017-05-18 DIAGNOSIS — Z882 Allergy status to sulfonamides status: Secondary | ICD-10-CM | POA: Diagnosis not present

## 2017-05-18 DIAGNOSIS — E039 Hypothyroidism, unspecified: Secondary | ICD-10-CM | POA: Diagnosis not present

## 2017-05-18 DIAGNOSIS — Z833 Family history of diabetes mellitus: Secondary | ICD-10-CM | POA: Insufficient documentation

## 2017-05-18 DIAGNOSIS — F329 Major depressive disorder, single episode, unspecified: Secondary | ICD-10-CM | POA: Diagnosis not present

## 2017-05-18 DIAGNOSIS — Z888 Allergy status to other drugs, medicaments and biological substances status: Secondary | ICD-10-CM | POA: Diagnosis not present

## 2017-05-18 DIAGNOSIS — C50912 Malignant neoplasm of unspecified site of left female breast: Secondary | ICD-10-CM | POA: Diagnosis not present

## 2017-05-18 DIAGNOSIS — Z811 Family history of alcohol abuse and dependence: Secondary | ICD-10-CM | POA: Insufficient documentation

## 2017-05-18 DIAGNOSIS — Z823 Family history of stroke: Secondary | ICD-10-CM | POA: Insufficient documentation

## 2017-05-18 DIAGNOSIS — Z803 Family history of malignant neoplasm of breast: Secondary | ICD-10-CM | POA: Diagnosis not present

## 2017-05-18 DIAGNOSIS — Z87891 Personal history of nicotine dependence: Secondary | ICD-10-CM | POA: Insufficient documentation

## 2017-05-18 HISTORY — PX: MASTECTOMY W/ SENTINEL NODE BIOPSY: SHX2001

## 2017-05-18 SURGERY — MASTECTOMY WITH SENTINEL LYMPH NODE BIOPSY
Anesthesia: General | Laterality: Left | Wound class: Clean

## 2017-05-18 MED ORDER — FENTANYL CITRATE (PF) 100 MCG/2ML IJ SOLN
INTRAMUSCULAR | Status: DC | PRN
  Administered 2017-05-18 (×2): 25 ug via INTRAVENOUS
  Administered 2017-05-18: 50 ug via INTRAVENOUS
  Administered 2017-05-18 (×3): 25 ug via INTRAVENOUS
  Administered 2017-05-18: 50 ug via INTRAVENOUS
  Administered 2017-05-18: 25 ug via INTRAVENOUS

## 2017-05-18 MED ORDER — FENTANYL CITRATE (PF) 100 MCG/2ML IJ SOLN
INTRAMUSCULAR | Status: AC
  Administered 2017-05-18: 25 ug via INTRAVENOUS
  Filled 2017-05-18: qty 2

## 2017-05-18 MED ORDER — MIDAZOLAM HCL 2 MG/2ML IJ SOLN
INTRAMUSCULAR | Status: AC
  Filled 2017-05-18: qty 2

## 2017-05-18 MED ORDER — LIDOCAINE HCL (CARDIAC) 20 MG/ML IV SOLN
INTRAVENOUS | Status: DC | PRN
  Administered 2017-05-18: 40 mg via INTRAVENOUS

## 2017-05-18 MED ORDER — ROCURONIUM BROMIDE 50 MG/5ML IV SOLN
INTRAVENOUS | Status: AC
  Filled 2017-05-18: qty 1

## 2017-05-18 MED ORDER — KETOROLAC TROMETHAMINE 30 MG/ML IJ SOLN
INTRAMUSCULAR | Status: DC | PRN
  Administered 2017-05-18: 30 mg via INTRAVENOUS

## 2017-05-18 MED ORDER — ONDANSETRON HCL 4 MG/2ML IJ SOLN: 4.0000 mg | Freq: Once | INTRAMUSCULAR | Status: DC | PRN

## 2017-05-18 MED ORDER — METHYLENE BLUE 0.5 % INJ SOLN
INTRAVENOUS | Status: AC
  Filled 2017-05-18: qty 10

## 2017-05-18 MED ORDER — FENTANYL CITRATE (PF) 250 MCG/5ML IJ SOLN
INTRAMUSCULAR | Status: AC
  Filled 2017-05-18: qty 5

## 2017-05-18 MED ORDER — FAMOTIDINE 20 MG PO TABS
ORAL_TABLET | ORAL | Status: AC
  Filled 2017-05-18: qty 1

## 2017-05-18 MED ORDER — LACTATED RINGERS IV SOLN
INTRAVENOUS | Status: DC
  Administered 2017-05-18 (×2): via INTRAVENOUS

## 2017-05-18 MED ORDER — PROPOFOL 10 MG/ML IV BOLUS
INTRAVENOUS | Status: AC
  Filled 2017-05-18: qty 20

## 2017-05-18 MED ORDER — LIDOCAINE HCL (PF) 2 % IJ SOLN
INTRAMUSCULAR | Status: AC
  Filled 2017-05-18: qty 2

## 2017-05-18 MED ORDER — BUPIVACAINE HCL (PF) 0.5 % IJ SOLN
INTRAMUSCULAR | Status: AC
  Filled 2017-05-18: qty 30

## 2017-05-18 MED ORDER — ONDANSETRON HCL 4 MG/2ML IJ SOLN
INTRAMUSCULAR | Status: DC | PRN
  Administered 2017-05-18: 4 mg via INTRAVENOUS

## 2017-05-18 MED ORDER — HYDROCODONE-ACETAMINOPHEN 7.5-325 MG PO TABS: 1.0000 | ORAL_TABLET | ORAL | 0 refills | Status: DC | PRN

## 2017-05-18 MED ORDER — DEXAMETHASONE SODIUM PHOSPHATE 10 MG/ML IJ SOLN
INTRAMUSCULAR | Status: DC | PRN
  Administered 2017-05-18: 10 mg via INTRAVENOUS

## 2017-05-18 MED ORDER — ACETAMINOPHEN 10 MG/ML IV SOLN
INTRAVENOUS | Status: DC | PRN
  Administered 2017-05-18: 1000 mg via INTRAVENOUS

## 2017-05-18 MED ORDER — FENTANYL CITRATE (PF) 100 MCG/2ML IJ SOLN
25.0000 ug | INTRAMUSCULAR | Status: DC | PRN
  Administered 2017-05-18 (×4): 25 ug via INTRAVENOUS

## 2017-05-18 MED ORDER — DEXAMETHASONE SODIUM PHOSPHATE 10 MG/ML IJ SOLN
INTRAMUSCULAR | Status: AC
  Filled 2017-05-18: qty 1

## 2017-05-18 MED ORDER — FAMOTIDINE 20 MG PO TABS
20.0000 mg | ORAL_TABLET | Freq: Once | ORAL | Status: AC
  Administered 2017-05-18: 20 mg via ORAL

## 2017-05-18 MED ORDER — ACETAMINOPHEN 10 MG/ML IV SOLN
INTRAVENOUS | Status: AC
  Filled 2017-05-18: qty 100

## 2017-05-18 MED ORDER — MIDAZOLAM HCL 2 MG/2ML IJ SOLN
INTRAMUSCULAR | Status: DC | PRN
Start: 2017-05-18 — End: 2017-05-18
  Administered 2017-05-18: 1 mg via INTRAVENOUS

## 2017-05-18 MED ORDER — TECHNETIUM TC 99M SULFUR COLLOID FILTERED
1.0000 | Freq: Once | INTRAVENOUS | Status: AC | PRN
  Administered 2017-05-18: 0.838 via INTRADERMAL

## 2017-05-18 MED ORDER — METHYLENE BLUE 0.5 % INJ SOLN
INTRAVENOUS | Status: DC | PRN
  Administered 2017-05-18: 5 mL via SUBMUCOSAL

## 2017-05-18 MED ORDER — PROPOFOL 10 MG/ML IV BOLUS
INTRAVENOUS | Status: DC | PRN
  Administered 2017-05-18: 150 mg via INTRAVENOUS

## 2017-05-18 MED ORDER — KETOROLAC TROMETHAMINE 30 MG/ML IJ SOLN
INTRAMUSCULAR | Status: AC
  Filled 2017-05-18: qty 1

## 2017-05-18 MED ORDER — PHENYLEPHRINE HCL 10 MG/ML IJ SOLN
INTRAMUSCULAR | Status: DC | PRN
  Administered 2017-05-18 (×2): 100 ug via INTRAVENOUS

## 2017-05-18 SURGICAL SUPPLY — 70 items
APPLIER CLIP 11 MED OPEN (CLIP)
APPLIER CLIP 13 LRG OPEN (CLIP)
APR CLP LRG 13 20 CLIP (CLIP)
APR CLP MED 11 20 MLT OPN (CLIP)
BANDAGE ELASTIC 6 LF NS (GAUZE/BANDAGES/DRESSINGS) ×3 IMPLANT
BLADE SURG 15 STRL SS SAFETY (BLADE) ×3 IMPLANT
BNDG CMPR MED 5X6 ELC HKLP NS (GAUZE/BANDAGES/DRESSINGS) ×1
BNDG GAUZE 4.5X4.1 6PLY STRL (MISCELLANEOUS) ×3 IMPLANT
BULB RESERV EVAC DRAIN JP 100C (MISCELLANEOUS) ×2 IMPLANT
CANISTER SUCT 1200ML W/VALVE (MISCELLANEOUS) ×3 IMPLANT
CHLORAPREP W/TINT 26ML (MISCELLANEOUS) ×3 IMPLANT
CLIP APPLIE 11 MED OPEN (CLIP) ×1 IMPLANT
CLIP APPLIE 13 LRG OPEN (CLIP) IMPLANT
CLOSURE WOUND 1/2 X4 (GAUZE/BANDAGES/DRESSINGS) ×2
CNTNR SPEC 2.5X3XGRAD LEK (MISCELLANEOUS) ×1
CONT SPEC 4OZ STER OR WHT (MISCELLANEOUS) ×2
CONT SPEC 4OZ STRL OR WHT (MISCELLANEOUS) ×1
CONTAINER SPEC 2.5X3XGRAD LEK (MISCELLANEOUS) ×3 IMPLANT
COVER PROBE FLX POLY STRL (MISCELLANEOUS) ×2 IMPLANT
DEVICE DUBIN SPECIMEN MAMMOGRA (MISCELLANEOUS) ×1 IMPLANT
DRAIN CHANNEL JP 15F RND 16 (MISCELLANEOUS) ×2 IMPLANT
DRAPE INCISE 23X17 IOBAN STRL (DRAPES) ×2
DRAPE INCISE 23X17 STRL (DRAPES) IMPLANT
DRAPE INCISE IOBAN 23X17 STRL (DRAPES) ×1 IMPLANT
DRAPE LAPAROTOMY TRNSV 106X77 (MISCELLANEOUS) ×3 IMPLANT
DRSG TEGADERM 4X4.75 (GAUZE/BANDAGES/DRESSINGS) ×1 IMPLANT
DRSG TELFA 3X8 NADH (GAUZE/BANDAGES/DRESSINGS) ×3 IMPLANT
DRSG TELFA 4X3 1S NADH ST (GAUZE/BANDAGES/DRESSINGS) ×3 IMPLANT
ELECT CAUTERY BLADE TIP 2.5 (TIP) ×3
ELECT REM PT RETURN 9FT ADLT (ELECTROSURGICAL) ×3
ELECTRODE CAUTERY BLDE TIP 2.5 (TIP) ×1 IMPLANT
ELECTRODE REM PT RTRN 9FT ADLT (ELECTROSURGICAL) ×1 IMPLANT
GAUZE FLUFF 18X24 1PLY STRL (GAUZE/BANDAGES/DRESSINGS) ×3 IMPLANT
GLOVE BIO SURGEON STRL SZ7.5 (GLOVE) ×5 IMPLANT
GLOVE INDICATOR 8.0 STRL GRN (GLOVE) ×5 IMPLANT
GOWN STRL REUS W/ TWL LRG LVL3 (GOWN DISPOSABLE) ×2 IMPLANT
GOWN STRL REUS W/TWL LRG LVL3 (GOWN DISPOSABLE) ×6
KIT RM TURNOVER STRD PROC AR (KITS) ×3 IMPLANT
LABEL OR SOLS (LABEL) ×1 IMPLANT
MARGIN MAP 10MM (MISCELLANEOUS) ×3 IMPLANT
NDL HYPO 25X1 1.5 SAFETY (NEEDLE) ×1 IMPLANT
NEEDLE HYPO 25X1 1.5 SAFETY (NEEDLE) ×3 IMPLANT
NS IRRIG 500ML POUR BTL (IV SOLUTION) ×1 IMPLANT
PACK BASIN MINOR ARMC (MISCELLANEOUS) ×3 IMPLANT
PAD DRESSING TELFA 3X8 NADH (GAUZE/BANDAGES/DRESSINGS) ×1 IMPLANT
PIN SAFETY STRL (MISCELLANEOUS) ×1 IMPLANT
SHEARS FOC LG CVD HARMONIC 17C (MISCELLANEOUS) ×1 IMPLANT
SLEVE PROBE SENORX GAMMA FIND (MISCELLANEOUS) ×3 IMPLANT
SPONGE LAP 18X18 5 PK (GAUZE/BANDAGES/DRESSINGS) ×3 IMPLANT
STRIP CLOSURE SKIN 1/2X4 (GAUZE/BANDAGES/DRESSINGS) ×4 IMPLANT
SUT ETHILON 3-0 FS-10 30 BLK (SUTURE)
SUT SILK 0 (SUTURE) ×3
SUT SILK 0 30XBRD TIE 6 (SUTURE) ×1 IMPLANT
SUT SILK 2 0 (SUTURE) ×3
SUT SILK 2-0 30XBRD TIE 12 (SUTURE) ×1 IMPLANT
SUT SILK 3 0 (SUTURE) ×3
SUT SILK 3-0 18XBRD TIE 12 (SUTURE) ×1 IMPLANT
SUT VIC AB 0 CT1 36 (SUTURE) ×3 IMPLANT
SUT VIC AB 2-0 CT1 27 (SUTURE) ×12
SUT VIC AB 2-0 CT1 TAPERPNT 27 (SUTURE) ×4 IMPLANT
SUT VIC AB 3-0 54X BRD REEL (SUTURE) ×1 IMPLANT
SUT VIC AB 3-0 BRD 54 (SUTURE) ×3
SUT VIC AB 3-0 SH 27 (SUTURE) ×3
SUT VIC AB 3-0 SH 27X BRD (SUTURE) ×1 IMPLANT
SUT VIC AB 4-0 PS2 18 (SUTURE) ×3 IMPLANT
SUT VICRYL+ 3-0 144IN (SUTURE) ×3 IMPLANT
SUTURE EHLN 3-0 FS-10 30 BLK (SUTURE) ×1 IMPLANT
SWABSTK COMLB BENZOIN TINCTURE (MISCELLANEOUS) ×1 IMPLANT
SYR CONTROL 10ML (SYRINGE) ×3 IMPLANT
TAPE TRANSPORE STRL 2 31045 (GAUZE/BANDAGES/DRESSINGS) ×3 IMPLANT

## 2017-05-18 NOTE — Anesthesia Postprocedure Evaluation (Signed)
Anesthesia Post Note  Patient: Samantha Valentine  Procedure(s) Performed: Procedure(s) (LRB): MASTECTOMY WITH SENTINEL LYMPH NODE BIOPSY (Left)  Patient location during evaluation: PACU Anesthesia Type: General Level of consciousness: awake and alert and oriented Pain management: pain level controlled Vital Signs Assessment: post-procedure vital signs reviewed and stable Respiratory status: spontaneous breathing, nonlabored ventilation and respiratory function stable Cardiovascular status: blood pressure returned to baseline and stable Postop Assessment: no signs of nausea or vomiting Anesthetic complications: no     Last Vitals:  Vitals:   05/18/17 1250 05/18/17 1255  BP:  (!) 141/83  Pulse: 95 93  Resp: 17 17  Temp:  (!) 36.4 C    Last Pain:  Vitals:   05/18/17 1255  TempSrc:   PainSc: 4                  Haset Oaxaca

## 2017-05-18 NOTE — H&P (Signed)
No change in clinical condition or exam.   Plan: Left mastectomy w/ SLN biopsy, possible axillary dissection.

## 2017-05-18 NOTE — Anesthesia Preprocedure Evaluation (Signed)
Anesthesia Evaluation  Patient identified by MRN, date of birth, ID band Patient awake    Reviewed: Allergy & Precautions, H&P , NPO status , Patient's Chart, lab work & pertinent test results, reviewed documented beta blocker date and time   Airway Mallampati: II  TM Distance: >3 FB Neck ROM: full    Dental  (+) Teeth Intact   Pulmonary neg pulmonary ROS, former smoker,    Pulmonary exam normal        Cardiovascular Exercise Tolerance: Good negative cardio ROS Normal cardiovascular exam Rate:Normal     Neuro/Psych  Headaches, PSYCHIATRIC DISORDERS Depression negative neurological ROS  negative psych ROS   GI/Hepatic negative GI ROS, Neg liver ROS, GERD  Medicated,  Endo/Other  negative endocrine ROSHypothyroidism   Renal/GU negative Renal ROS  negative genitourinary   Musculoskeletal  (+) Arthritis , Rheumatoid disorders,  Fibromyalgia -  Abdominal   Peds  Hematology negative hematology ROS (+)   Anesthesia Other Findings   Reproductive/Obstetrics negative OB ROS                             Anesthesia Physical Anesthesia Plan  ASA: III  Anesthesia Plan: General LMA   Post-op Pain Management:    Induction:   PONV Risk Score and Plan: 4 or greater and Ondansetron, Dexamethasone, Midazolam, Scopolamine patch - Pre-op and Propofol infusion  Airway Management Planned: LMA  Additional Equipment:   Intra-op Plan:   Post-operative Plan:   Informed Consent: I have reviewed the patients History and Physical, chart, labs and discussed the procedure including the risks, benefits and alternatives for the proposed anesthesia with the patient or authorized representative who has indicated his/her understanding and acceptance.     Plan Discussed with: CRNA  Anesthesia Plan Comments:         Anesthesia Quick Evaluation

## 2017-05-18 NOTE — Anesthesia Procedure Notes (Signed)
Procedure Name: LMA Insertion Date/Time: 05/18/2017 10:40 AM Performed by: Allean Found Pre-anesthesia Checklist: Patient identified, Emergency Drugs available, Suction available, Patient being monitored and Timeout performed Patient Re-evaluated:Patient Re-evaluated prior to induction Oxygen Delivery Method: Circle system utilized Preoxygenation: Pre-oxygenation with 100% oxygen Induction Type: IV induction Ventilation: Mask ventilation without difficulty LMA: LMA inserted LMA Size: 4.0 Number of attempts: 1 Placement Confirmation: positive ETCO2 Dental Injury: Teeth and Oropharynx as per pre-operative assessment

## 2017-05-18 NOTE — Op Note (Signed)
Preoperative diagnosis: Left breast cancer status post neoadjuvant chemotherapy.  Postoperative diagnosis: Same.  Operative procedure: Left simple mastectomy with ultrasound-guided sentinel node biopsy 3.  Operating surgeon: Lacie Scotts, M.D.  Anesthesia: Gen. by LMA.  Estimated blood loss: Less than 30 cc  Clinical note: This 71 year old woman was noted with a mass in the upper outer quadrant of the left breast and ultrasound suggesting a prominent axillary node. Biopsy showed invasive mammary carcinoma. She completed 3 of 4 planned cycles of neoadjuvant chemotherapy. She is interested and postoperative radiation therapy and was let to proceed mastectomy. Plans were for simple mastectomy and sentinel node biopsy. The patient was injected with technetium sulfur colloid prior to the procedure.  Operative note: With the patient under adequate general anesthesia alcohol was applied to the skin 5 cc of 0.5% methylene blue were instilled in the subareolar plexus. The left breast, chest and axilla was then prepped with ChloraPrep and draped. Ultrasound was used to identify the previously prominent axillary node and this was marked in the axilla. Elliptical flaps were outlined transversely oriented on left chest. The upper flap was elevated first. The skin was incised sharply and the remaining dissection completed with electrocautery. The margins of the final dissection were the sternum medially, clavicle superiorly, pectoralis fascia laterally and the rectus fascia inferiorly. The axillary embolus was opened and the node seeker was used to identify 3 hot, non-blue lymph nodes. The dominant node identified on ultrasound was removed with these 3 lymph nodes. Touch prep report from pathology showed no evidence of macroscopic tumor, chemotherapy effects in one of the 3 nodes corresponding with the ultrasound finding.  Hemostasis was electrocautery and 3-0 Vicryl ties. A single perforating vessel of the  intramammary artery was controlled with a 3-0 Vicryl tie. The breast was elevated off the underlying pectoralis muscle with the fascia taken with the specimen. This was sent fresh to pathology per protocol. The wound was irrigated with sterile water. A Blake drain was brought out through the inferior medial flap and anchored in place with a 3-0 nylon suture. The skin flaps were approximated with a running 3-0 Vicryl deep dermal suture in 2 segments. Benzoin, Steri-Strips applied. Telfa pad, fluffed gauze, Kerlix and Ace wrap was applied as a compressive wrap.  The patient tolerated the procedure well and was taken to the recovery room in stable condition.

## 2017-05-18 NOTE — Discharge Instructions (Signed)

## 2017-05-18 NOTE — Anesthesia Post-op Follow-up Note (Cosign Needed)
Anesthesia QCDR form completed.        

## 2017-05-18 NOTE — Transfer of Care (Signed)
Immediate Anesthesia Transfer of Care Note  Patient: Samantha Valentine  Procedure(s) Performed: Procedure(s): MASTECTOMY WITH SENTINEL LYMPH NODE BIOPSY (Left)  Patient Location: PACU  Anesthesia Type:General  Level of Consciousness: sedated  Airway & Oxygen Therapy: Patient Spontanous Breathing and Patient connected to face mask oxygen  Post-op Assessment: Report given to RN and Post -op Vital signs reviewed and stable  Post vital signs: Reviewed and stable  Last Vitals:  Vitals:   05/18/17 0917  BP: (!) 153/89  Pulse: 90  Resp: 14  Temp: 36.6 C    Last Pain:  Vitals:   05/18/17 0917  TempSrc: Tympanic  PainSc: 6          Complications: No apparent anesthesia complications

## 2017-05-19 ENCOUNTER — Telehealth: Payer: Self-pay | Admitting: General Surgery

## 2017-05-19 LAB — SURGICAL PATHOLOGY

## 2017-05-19 NOTE — Telephone Encounter (Signed)
The patient was notified that final pathology showed residual tumor in the breast, margins clear, 0/3 sentinel nodes with metastatic disease, evidence of chemotherapy effect in one of 3 nodes(prominent on ultrasound preop) C.  Patient reports doing well. Follow-up on 05/21/2017 is plan for dressing change.

## 2017-05-21 ENCOUNTER — Ambulatory Visit (INDEPENDENT_AMBULATORY_CARE_PROVIDER_SITE_OTHER): Payer: PPO

## 2017-05-21 DIAGNOSIS — C50412 Malignant neoplasm of upper-outer quadrant of left female breast: Secondary | ICD-10-CM

## 2017-05-21 DIAGNOSIS — Z171 Estrogen receptor negative status [ER-]: Secondary | ICD-10-CM

## 2017-05-21 NOTE — Progress Notes (Signed)
Patient came in today for a wound check.  The wound is clean, with no signs of infection noted. Mastectomy site clean. dressing changed and re wrapped. She has her drain sheet present, drainage 140 cc yesterday. Follow up as scheduled.

## 2017-05-25 ENCOUNTER — Encounter: Payer: Self-pay | Admitting: Family Medicine

## 2017-05-25 ENCOUNTER — Ambulatory Visit (INDEPENDENT_AMBULATORY_CARE_PROVIDER_SITE_OTHER): Payer: PPO | Admitting: Family Medicine

## 2017-05-25 ENCOUNTER — Ambulatory Visit (INDEPENDENT_AMBULATORY_CARE_PROVIDER_SITE_OTHER): Payer: PPO | Admitting: General Surgery

## 2017-05-25 ENCOUNTER — Encounter: Payer: Self-pay | Admitting: General Surgery

## 2017-05-25 ENCOUNTER — Ambulatory Visit (INDEPENDENT_AMBULATORY_CARE_PROVIDER_SITE_OTHER)
Admission: RE | Admit: 2017-05-25 | Discharge: 2017-05-25 | Disposition: A | Discharge status: Home / Self Care | Payer: PPO | Source: Ambulatory Visit | Attending: Family Medicine | Admitting: Family Medicine

## 2017-05-25 VITALS — BP 136/82 | HR 140 | Temp 98.3°F | Ht 64.0 in | Wt 173.8 lb

## 2017-05-25 VITALS — BP 138/74 | HR 148 | Resp 16 | Ht 64.0 in | Wt 173.0 lb

## 2017-05-25 DIAGNOSIS — Z171 Estrogen receptor negative status [ER-]: Secondary | ICD-10-CM

## 2017-05-25 DIAGNOSIS — C50412 Malignant neoplasm of upper-outer quadrant of left female breast: Secondary | ICD-10-CM

## 2017-05-25 DIAGNOSIS — R000 Tachycardia, unspecified: Secondary | ICD-10-CM

## 2017-05-25 DIAGNOSIS — R300 Dysuria: Secondary | ICD-10-CM | POA: Diagnosis not present

## 2017-05-25 DIAGNOSIS — E039 Hypothyroidism, unspecified: Secondary | ICD-10-CM | POA: Diagnosis not present

## 2017-05-25 LAB — POC URINALSYSI DIPSTICK (AUTOMATED)
Bilirubin, UA: NEGATIVE
GLUCOSE UA: NEGATIVE
Ketones, UA: NEGATIVE
Leukocytes, UA: NEGATIVE
Nitrite, UA: NEGATIVE
PH UA: 5.5 (ref 5.0–8.0)
Protein, UA: NEGATIVE
RBC UA: NEGATIVE
UROBILINOGEN UA: 0.2 U/dL

## 2017-05-25 NOTE — Assessment & Plan Note (Signed)
Disc poss causes incl dehydration/anemia/infection/stress and pain DVT less likely due to lack of symptoms and nl vitals incl pulse ox  EKG show sinus tach w/o acute changes (re assuringly) Lab today  cxr today  Reassuring exam

## 2017-05-25 NOTE — Patient Instructions (Addendum)
The patient is aware to call back for any questions or concerns. May shower Continue drain record sheet. Appointment with Dr Loura Pardon today at 4:15 for increased heart rate

## 2017-05-25 NOTE — Assessment & Plan Note (Signed)
ua is nl -but concentrated Adv to inc fluids

## 2017-05-25 NOTE — Progress Notes (Signed)
Subjective:    Patient ID: Samantha Valentine, female    DOB: 03/23/1946, 71 y.o.   MRN: [id number]  HPI Here for tachycardia   Her surgeon called here today concerned about a HR of 148 in the office (not irregular with otherwise nl vitals and no fever or other symptoms) She has had a fast pulse rate in the past w/o arrhythmia     This was her post op visit for L mastectomy on 7/31  C/o a lot of RA pain today   Pulse Readings from Last 3 Encounters:  05/25/17 (!) 140  05/25/17 (!) 148  05/18/17 93   BP Readings from Last 3 Encounters:  05/25/17 136/82  05/25/17 138/74  05/18/17 (!) 143/76   Wt Readings from Last 3 Encounters:  05/25/17 173 lb 12 oz (78.8 kg)  05/25/17 173 lb (78.5 kg)  05/18/17 175 lb (79.4 kg)    Temp: 98.3 F (36.8 C)      Chemistry      Component Value Date/Time   NA 139 04/14/2017 1006   K 3.5 04/14/2017 1006   CL 108 04/14/2017 1006   CO2 25 04/14/2017 1006   BUN 13 04/14/2017 1006   CREATININE 0.47 04/14/2017 1006   CREATININE 0.64 12/30/2016 0913      Component Value Date/Time   CALCIUM 8.6 (L) 04/14/2017 1006   ALKPHOS 77 04/14/2017 1006   AST 26 04/14/2017 1006   ALT 15 04/14/2017 1006   BILITOT 0.4 04/14/2017 1006      Lab Results  Component Value Date   TSH 1.65 11/13/2016    Lab Results  Component Value Date   WBC 4.6 04/14/2017   HGB 11.1 (L) 04/14/2017   HCT 32.3 (L) 04/14/2017   MCV 91.2 04/14/2017   PLT 323 04/14/2017    Pulse ox today is 95%  Pt states she has a lot of RA pain  Her HR went up 130s in the hospital due to pain -went down with pain med Went up after first chemo also   Got her pressure bandage off -thankful for that  Not much pain at all from that  A lot of RA pain in neck  2 pain pills after surgery-that is it   On celebrex  occ ibuprofen no abd pain  A little discomfort with urination  (did not have a cath)   She quit chemo - she had enough    Patient Active Problem List   Diagnosis Date Noted  . Tachycardia 05/25/2017  . Dysuria 05/25/2017  . Estrogen deficiency 03/22/2017  . Malignant neoplasm of upper-outer quadrant of left breast in female, estrogen receptor negative ([place]) 02/04/2017  . Tendinopathy of right shoulder 01/25/2017  . High risk medication use 09/29/2016  . DJD (degenerative joint disease), cervical 09/29/2016  . History of right hip replacement 09/29/2016  . Eczema 07/13/2014  . Adverse effect of immunosuppressive drug 04/27/2012  . ANXIETY DEPRESSION 10/28/2008  . Spinal stenosis 12/29/2007  . Hypothyroidism 12/28/2007  . Vitamin D deficiency 12/28/2007  . HEARING LOSS 12/28/2007  . Seropositive rheumatoid arthritis of multiple sites ([place]) 12/28/2007  . DDD (degenerative disc disease), lumbar 12/28/2007  . Fibromyalgia 12/28/2007  . Osteoporosis 12/28/2007  . MIGRAINES, HX OF 12/28/2007   Past Medical History:  Diagnosis Date  . Arthritis    RA  . Breast mass 1 year   . Cancer ([place]) 01/29/2017   left breast INVASIVE MAMMARY CARCINOMA, ER/PR positive  . Collagen vascular disease ([place])  Rhematoid Arthritis  . DDD (degenerative disc disease)    in neck  . Depression   . Fibromyalgia   . GERD (gastroesophageal reflux disease)    NO MEDS  . History of kidney stones    MULTIPLE KIDNEY STONES BIL  . Hypothyroidism   . Migraine   . Osteoporosis   . Rheumatoid arthritis [facility])    Past Surgical History:  Procedure Laterality Date  . BREAST BIOPSY Left 01/29/2017   US guided biopsy INVASIVE MAMMARY CARCINOMA  . JOINT REPLACEMENT Right 2003   total hip replacement  . LITHOTRIPSY  1997   kidney stone  . MASTECTOMY W/ SENTINEL NODE BIOPSY Left 05/18/2017   Procedure: MASTECTOMY WITH SENTINEL LYMPH NODE BIOPSY;  Surgeon: Robert Bellow, MD;  Location: [facility] ORS;  Service: General;  Laterality: Left;  . PORTACATH PLACEMENT Right 02/15/2017   Procedure: INSERTION PORT-A-CATH;  Surgeon: Robert Bellow, MD;  Location: [facility]  ORS;  Service: General;  Laterality: Right;  . TONSILLECTOMY  1970   Social History  Substance Use Topics  . Smoking status: Former Smoker    Packs/day: 1.50    Years: 25.00    Types: Cigarettes    Quit date: 10/20/1983  . Smokeless tobacco: Never Used  . Alcohol use No   Family History  Problem Relation Age of Onset  . Alcohol abuse Father   . Diabetes Father   . Stroke Father   . Hypertension Mother   . Endometrial cancer Mother   . Osteoarthritis Mother   . Breast cancer Paternal Aunt 81  . Liver disease Sister   . Breast cancer Maternal Aunt   . Rheum arthritis Maternal Grandmother   . Diabetes Paternal Grandmother   . Parkinson's disease Paternal Grandfather    Allergies  Allergen Reactions  . Adhesive [Tape] Other (See Comments)    Skin blistered (PAPER TAPE OK)  . Hydroxychloroquine Sulfate Rash  . Ibandronate Sodium Rash  . Risedronate Sodium Rash   Current Outpatient Prescriptions on File Prior to Visit  Medication Sig Dispense Refill  . ALPRAZolam (XANAX) 0.25 MG tablet Take 1 tablet (0.25 mg total) by mouth 2 (two) times daily as needed for anxiety. 60 tablet 0  . aspirin EC 81 MG tablet Take 81 mg by mouth daily.    . celecoxib (CELEBREX) 200 MG capsule 1 capsule by mouth twice a day when necessary with meals (Patient taking differently: Take 200 mg by mouth daily as needed (for pain.). ) 135 capsule 0  . cholecalciferol (VITAMIN D) 1000 units tablet Take 1,000 Units by mouth daily.    Marland Kitchen ibuprofen (ADVIL,MOTRIN) 200 MG tablet Take 400 mg by mouth every 8 (eight) hours as needed (for pain.).    Marland Kitchen levothyroxine (SYNTHROID, LEVOTHROID) 137 MCG tablet Take 1 tablet (137 mcg total) by mouth daily before breakfast. 30 tablet 11   No current facility-administered medications on file prior to visit.     Review of Systems Review of Systems  Constitutional: Negative for fever, appetite change, fatigue and unexpected weight change.  Eyes: Negative for pain and visual  disturbance.  Respiratory: Negative for cough and shortness of breath.   Cardiovascular: Negative for cp or palpitations   pos for tachycardia w/o symptoms  Gastrointestinal: Negative for nausea, diarrhea and constipation.  Genitourinary: Negative for urgency and frequency.  Skin: Negative for pallor or rash  pos for healing incision on chest that is not very painful MSK pos for painful RA Neurological: Negative for weakness, light-headedness, numbness and  headaches.  Hematological: Negative for adenopathy. Does not bruise/bleed easily.  Psychiatric/Behavioral: Negative for dysphoric mood. The patient is not nervous/anxious.         Objective:   Physical Exam  Constitutional: She appears well-developed and well-nourished. No distress.  HENT:  Head: Normocephalic and atraumatic.  Right Ear: External ear normal.  Left Ear: External ear normal.  Nose: Nose normal.  Mouth/Throat: Oropharynx is clear and moist. No oropharyngeal exudate.  Eyes: Pupils are equal, round, and reactive to light. Conjunctivae and EOM are normal. Right eye exhibits no discharge. Left eye exhibits no discharge. No scleral icterus.  Neck: Normal range of motion. Neck supple. No JVD present. Carotid bruit is not present. No thyromegaly present.  Cardiovascular: Normal rate, regular rhythm, normal heart sounds and intact distal pulses.  Exam reveals no gallop.   Pulmonary/Chest: Effort normal and breath sounds normal. No respiratory distress. She has no wheezes. She has no rales. She exhibits no tenderness.  No crackles No wheeze or rales   Abdominal: Soft. Bowel sounds are normal. She exhibits no distension, no abdominal bruit and no mass. There is no tenderness.  Musculoskeletal: She exhibits no edema or tenderness.  Lymphadenopathy:    She has no cervical adenopathy.  Neurological: She is alert. She has normal reflexes. No cranial nerve deficit. She exhibits normal muscle tone. Coordination normal.  Skin: Skin is  warm and dry. No rash noted.  Healing L chest incision w/o signs of infection   Psychiatric: She has a normal mood and affect.          Assessment & Plan:   Problem List Items Addressed This Visit      Endocrine   Hypothyroidism    No missed doses of levothyroxine  Tachycardia today  TSh and advise        Relevant Orders   TSH     Other   Dysuria    ua is nl -but concentrated Adv to inc fluids        Relevant Orders   POCT Urinalysis Dipstick (Automated) (Completed)   Tachycardia - Primary    Disc poss causes incl dehydration/anemia/infection/stress and pain DVT less likely due to lack of symptoms and nl vitals incl pulse ox  EKG show sinus tach w/o acute changes (re assuringly) Lab today  cxr today  Reassuring exam      Relevant Orders   EKG 12-Lead (Completed)   DG Chest 2 View   CBC with Differential/Platelet   Comprehensive metabolic panel   Lipid panel   TSH

## 2017-05-25 NOTE — Patient Instructions (Signed)
Your EKG is re assuring - sinus tachycardia  If you develop shortness of breath /chest pain or other symptoms -go to the ED  Lab today  Urinalysis today  Chest xray today   Drink fluids - dehydration can cause problems

## 2017-05-25 NOTE — Progress Notes (Signed)
Patient ID: Samantha Valentine, female   DOB: [birth year], 71 y.o.   MRN: [id number]  Chief Complaint  Patient presents with  . Routine Post Op    HPI Anysha Ellerie Valentine is a 71 y.o. female.  Here today for postoperative visit, left mastectomy, she states she is doing well. Minimal pain. Denies any gastrointestinal issues, bowels are moving regular with stool softeners. More RA issues than post surgery issues. Drainage has been average 30 ml every 12 hours. She is here with her friend Harden Mo.  HPI  Past Medical History:  Diagnosis Date  . Arthritis    RA  . Breast mass 1 year   . Cancer ([place]) 01/29/2017   left breast INVASIVE MAMMARY CARCINOMA, ER/PR positive  . Collagen vascular disease ([facility])    Rhematoid Arthritis  . DDD (degenerative disc disease)    in neck  . Depression   . Fibromyalgia   . GERD (gastroesophageal reflux disease)    NO MEDS  . History of kidney stones    MULTIPLE KIDNEY STONES BIL  . Hypothyroidism   . Migraine   . Osteoporosis   . Rheumatoid arthritis [facility])     Past Surgical History:  Procedure Laterality Date  . BREAST BIOPSY Left 01/29/2017   US guided biopsy INVASIVE MAMMARY CARCINOMA  . JOINT REPLACEMENT Right 2003   total hip replacement  . LITHOTRIPSY  1997   kidney stone  . MASTECTOMY W/ SENTINEL NODE BIOPSY Left 05/18/2017   Procedure: MASTECTOMY WITH SENTINEL LYMPH NODE BIOPSY;  Surgeon: Robert Bellow, MD;  Location: [facility] ORS;  Service: General;  Laterality: Left;  . PORTACATH PLACEMENT Right 02/15/2017   Procedure: INSERTION PORT-A-CATH;  Surgeon: Robert Bellow, MD;  Location: [facility] ORS;  Service: General;  Laterality: Right;  . TONSILLECTOMY  1970    Family History  Problem Relation Age of Onset  . Alcohol abuse Father   . Diabetes Father   . Stroke Father   . Hypertension Mother   . Endometrial cancer Mother   . Osteoarthritis Mother   . Breast cancer Paternal Aunt 62  . Liver disease Sister   . Breast cancer  Maternal Aunt   . Rheum arthritis Maternal Grandmother   . Diabetes Paternal Grandmother   . Parkinson's disease Paternal Grandfather     Social History Social History  Substance Use Topics  . Smoking status: Former Smoker    Packs/day: 1.50    Years: 25.00    Types: Cigarettes    Quit date: 10/20/1983  . Smokeless tobacco: Never Used  . Alcohol use No    Allergies  Allergen Reactions  . Adhesive [Tape] Other (See Comments)    Skin blistered (PAPER TAPE OK)  . Hydroxychloroquine Sulfate Rash  . Ibandronate Sodium Rash  . Risedronate Sodium Rash    Current Outpatient Prescriptions  Medication Sig Dispense Refill  . ALPRAZolam (XANAX) 0.25 MG tablet Take 1 tablet (0.25 mg total) by mouth 2 (two) times daily as needed for anxiety. 60 tablet 0  . aspirin EC 81 MG tablet Take 81 mg by mouth daily.    . celecoxib (CELEBREX) 200 MG capsule 1 capsule by mouth twice a day when necessary with meals (Patient taking differently: Take 200 mg by mouth daily as needed (for pain.). ) 135 capsule 0  . cholecalciferol (VITAMIN D) 1000 units tablet Take 1,000 Units by mouth daily.    Marland Kitchen ibuprofen (ADVIL,MOTRIN) 200 MG tablet Take 400 mg by mouth every 8 (eight) hours as  needed (for pain.).    Marland Kitchen levothyroxine (SYNTHROID, LEVOTHROID) 137 MCG tablet Take 1 tablet (137 mcg total) by mouth daily before breakfast. 30 tablet 11   No current facility-administered medications for this visit.     Review of Systems Review of Systems  Constitutional: Negative.   Respiratory: Negative.  Negative for shortness of breath.   Cardiovascular: Negative.  Negative for palpitations and leg swelling.    Blood pressure 138/74, pulse (!) 148, resp. rate 16, height 5\' 4"  (1.626 m), weight 173 lb (78.5 kg).  Physical Exam Physical Exam  Constitutional: She is oriented to person, place, and time. She appears well-developed and well-nourished.  Cardiovascular: Regular rhythm and normal heart sounds.  Tachycardia  present.   Pulmonary/Chest: Effort normal and breath sounds normal.  Incisions clean  Neurological: She is alert and oriented to person, place, and time.  Skin: Skin is warm and dry.  Psychiatric: Her behavior is normal.    Data Reviewed A. SENTINEL LYMPH NODE 1, 2, AND 3; EXCISION:  - THREE LYMPH NODES NEGATIVE FOR MALIGNANCY (0/3).   B. BREAST, LEFT; SIMPLE MASTECTOMY:  - INVASIVE MAMMARY CARCINOMA.  - ONE BENIGN INTRAMAMMARY LYMPH NODE (0/1).  - SEE CANCER SUMMARY BELOW.  - UNREMARKABLE SKIN AND NIPPLE.    Treatment Effect:       In the breast: Probable or definite response to presurgical  therapy in the invasive carcinoma       In the lymph nodes: No lymph nodes metastasis. Fibrous  scarring, possibly related to prior lymph node metastasis with  pathologic complete response ypT2 ypN0   Assessment    Doing well status post simple mastectomy and sentinel node biopsy.  Asymptomatic tachycardia, previously documented without etiology noted.     Plan       Preoperative ECG showing sinus rhythm. I spoke with the patient's primary physician, Dr. Glori Bickers, who agreed to see the patient later this afternoon for assessment of her tachycardia.   Evidence of resolution of metastatic disease in the prominent lymph node identified on preoperative imaging.  Candidate for resumption of biologic agent for management of her rheumatoid arthritis the week of August 27.  Patient will meet with Dr. Grayland Ormond later this week to discuss need for adjuvant therapy.    Appointment with Dr Loura Pardon today at 4:15 for increased heart rate Follow up in one week. May shower Continue drain record sheet.  HPI, Physical Exam, Assessment and Plan have been scribed under the direction and in the presence of Robert Bellow, MD. Karie Fetch, RN  I have completed the exam and reviewed the above documentation for accuracy and completeness.  I agree with the above.  Haematologist  has been used and any errors in dictation or transcription are unintentional.  Hervey Ard, M.D., F.A.C.S.  Robert Bellow 05/26/2017, 7:03 AM

## 2017-05-25 NOTE — Assessment & Plan Note (Signed)
No missed doses of levothyroxine  Tachycardia today  TSh and advise

## 2017-05-26 ENCOUNTER — Inpatient Hospital Stay: Payer: PPO | Attending: Oncology | Admitting: Oncology

## 2017-05-26 ENCOUNTER — Encounter: Payer: Self-pay | Admitting: Oncology

## 2017-05-26 VITALS — BP 148/79 | HR 89 | Temp 98.7°F | Wt 172.5 lb

## 2017-05-26 DIAGNOSIS — Z87891 Personal history of nicotine dependence: Secondary | ICD-10-CM | POA: Diagnosis not present

## 2017-05-26 DIAGNOSIS — Z808 Family history of malignant neoplasm of other organs or systems: Secondary | ICD-10-CM | POA: Insufficient documentation

## 2017-05-26 DIAGNOSIS — Z87442 Personal history of urinary calculi: Secondary | ICD-10-CM | POA: Diagnosis not present

## 2017-05-26 DIAGNOSIS — Z79899 Other long term (current) drug therapy: Secondary | ICD-10-CM | POA: Diagnosis not present

## 2017-05-26 DIAGNOSIS — E039 Hypothyroidism, unspecified: Secondary | ICD-10-CM | POA: Insufficient documentation

## 2017-05-26 DIAGNOSIS — M069 Rheumatoid arthritis, unspecified: Secondary | ICD-10-CM | POA: Diagnosis not present

## 2017-05-26 DIAGNOSIS — C50412 Malignant neoplasm of upper-outer quadrant of left female breast: Secondary | ICD-10-CM | POA: Diagnosis not present

## 2017-05-26 DIAGNOSIS — Z7982 Long term (current) use of aspirin: Secondary | ICD-10-CM | POA: Insufficient documentation

## 2017-05-26 DIAGNOSIS — M810 Age-related osteoporosis without current pathological fracture: Secondary | ICD-10-CM | POA: Diagnosis not present

## 2017-05-26 DIAGNOSIS — K219 Gastro-esophageal reflux disease without esophagitis: Secondary | ICD-10-CM | POA: Diagnosis not present

## 2017-05-26 DIAGNOSIS — Z9012 Acquired absence of left breast and nipple: Secondary | ICD-10-CM

## 2017-05-26 DIAGNOSIS — Z8049 Family history of malignant neoplasm of other genital organs: Secondary | ICD-10-CM | POA: Insufficient documentation

## 2017-05-26 DIAGNOSIS — Z170 Estrogen receptor positive status [ER+]: Secondary | ICD-10-CM | POA: Diagnosis not present

## 2017-05-26 DIAGNOSIS — M797 Fibromyalgia: Secondary | ICD-10-CM | POA: Diagnosis not present

## 2017-05-26 DIAGNOSIS — Z171 Estrogen receptor negative status [ER-]: Secondary | ICD-10-CM

## 2017-05-26 DIAGNOSIS — Z803 Family history of malignant neoplasm of breast: Secondary | ICD-10-CM | POA: Insufficient documentation

## 2017-05-26 LAB — CBC WITH DIFFERENTIAL/PLATELET
BASOS ABS: 0 10*3/uL (ref 0.0–0.1)
Basophils Relative: 0.6 % (ref 0.0–3.0)
Eosinophils Absolute: 0.3 10*3/uL (ref 0.0–0.7)
Eosinophils Relative: 4.2 % (ref 0.0–5.0)
HCT: 40.2 % (ref 36.0–46.0)
Hemoglobin: 13.2 g/dL (ref 12.0–15.0)
LYMPHS ABS: 1.1 10*3/uL (ref 0.7–4.0)
Lymphocytes Relative: 16.9 % (ref 12.0–46.0)
MCHC: 32.8 g/dL (ref 30.0–36.0)
MCV: 98 fl (ref 78.0–100.0)
MONO ABS: 0.7 10*3/uL (ref 0.1–1.0)
MONOS PCT: 9.9 % (ref 3.0–12.0)
NEUTROS ABS: 4.6 10*3/uL (ref 1.4–7.7)
NEUTROS PCT: 68.4 % (ref 43.0–77.0)
PLATELETS: 241 10*3/uL (ref 150.0–400.0)
RBC: 4.11 Mil/uL (ref 3.87–5.11)
RDW: 15.1 % (ref 11.5–15.5)
WBC: 6.8 10*3/uL (ref 4.0–10.5)

## 2017-05-26 LAB — COMPREHENSIVE METABOLIC PANEL
ALT: 11 U/L (ref 0–35)
AST: 14 U/L (ref 0–37)
Albumin: 3.7 g/dL (ref 3.5–5.2)
Alkaline Phosphatase: 92 U/L (ref 39–117)
BILIRUBIN TOTAL: 0.3 mg/dL (ref 0.2–1.2)
BUN: 17 mg/dL (ref 6–23)
CO2: 29 meq/L (ref 19–32)
Calcium: 9.2 mg/dL (ref 8.4–10.5)
Chloride: 105 mEq/L (ref 96–112)
Creatinine, Ser: 0.76 mg/dL (ref 0.40–1.20)
GFR: 80.07 mL/min (ref >60.00)
GLUCOSE: 97 mg/dL (ref 70–99)
POTASSIUM: 4 meq/L (ref 3.5–5.1)
SODIUM: 140 meq/L (ref 135–145)
Total Protein: 6.9 g/dL (ref 6.0–8.3)

## 2017-05-26 LAB — LIPID PANEL
CHOL/HDL RATIO: 3
Cholesterol: 170 mg/dL (ref 0–200)
HDL: 56.9 mg/dL (ref >39.00)
LDL Cholesterol: 96 mg/dL (ref 0–99)
NONHDL: 113.57
Triglycerides: 86 mg/dL (ref 0.0–149.0)
VLDL: 17.2 mg/dL (ref 0.0–40.0)

## 2017-05-26 LAB — TSH: TSH: 0.18 u[IU]/mL — AB (ref 0.35–4.50)

## 2017-05-26 NOTE — Progress Notes (Signed)
[place]  Telephone:([phone]) [phone] Fax:(336) [phone]  ID: Samantha Valentine OB: 05/05/1946  MR#: [id number]  SHF#:[id number]  Patient Care Team: Abner Greenspan, MD as PCP - General Tower, Wynelle Fanny, MD as Consulting Physician (Family Medicine) Bary Castilla, Forest Gleason, MD (General Surgery)  CHIEF COMPLAINT: Pathologic stage IA ER/PR positive, HER-2 negative invasive carcinoma of the upper-outer quadrant of the left breast.  INTERVAL HISTORY: Patient returns to clinic today for further evaluation and discussion of her final pathology results. She continues to have drains in her mastectomy site, but otherwise feels well. Her weakness and fatigue have resolved. She continues to have a fair appetite. The joint pain from her rheumatoid arthritis is unchanged. She has no neurologic complaints. She denies any chest pain or shortness of breath. She has no urinary complaints. Patient offers no specific further complaints today.  REVIEW OF SYSTEMS:   Review of Systems  Constitutional: Negative for fever, malaise/fatigue and weight loss.  Respiratory: Negative.  Negative for cough and shortness of breath.   Cardiovascular: Negative.  Negative for chest pain and leg swelling.  Gastrointestinal: Negative for abdominal pain, diarrhea and nausea.  Genitourinary: Negative.   Musculoskeletal: Positive for joint pain.  Skin: Negative.  Negative for rash.  Neurological: Negative for sensory change and weakness.  Psychiatric/Behavioral: Negative.  The patient is not nervous/anxious.     As per HPI. Otherwise, a complete review of systems is negative.  PAST MEDICAL HISTORY: Past Medical History:  Diagnosis Date  . Arthritis    RA  . Breast mass 1 year   . Cancer ([place]) 01/29/2017   left breast INVASIVE MAMMARY CARCINOMA, ER/PR positive  . Collagen vascular disease (HCC)    Rhematoid Arthritis  . DDD (degenerative disc disease)    in neck  . Depression   . Fibromyalgia   . GERD  (gastroesophageal reflux disease)    NO MEDS  . History of kidney stones    MULTIPLE KIDNEY STONES BIL  . Hypothyroidism   . Migraine   . Osteoporosis   . Rheumatoid arthritis ([place])     PAST SURGICAL HISTORY: Past Surgical History:  Procedure Laterality Date  . BREAST BIOPSY Left 01/29/2017   US guided biopsy INVASIVE MAMMARY CARCINOMA  . JOINT REPLACEMENT Right 2003   total hip replacement  . LITHOTRIPSY  1997   kidney stone  . MASTECTOMY W/ SENTINEL NODE BIOPSY Left 05/18/2017   Procedure: MASTECTOMY WITH SENTINEL LYMPH NODE BIOPSY;  Surgeon: Robert Bellow, MD;  Location: [facility] ORS;  Service: General;  Laterality: Left;  . PORTACATH PLACEMENT Right 02/15/2017   Procedure: INSERTION PORT-A-CATH;  Surgeon: Robert Bellow, MD;  Location: [facility] ORS;  Service: General;  Laterality: Right;  . TONSILLECTOMY  1970    FAMILY HISTORY: Family History  Problem Relation Age of Onset  . Alcohol abuse Father   . Diabetes Father   . Stroke Father   . Hypertension Mother   . Endometrial cancer Mother   . Osteoarthritis Mother   . Breast cancer Paternal Aunt 67  . Liver disease Sister   . Breast cancer Maternal Aunt   . Rheum arthritis Maternal Grandmother   . Diabetes Paternal Grandmother   . Parkinson's disease Paternal Grandfather     ADVANCED DIRECTIVES (Y/N):  N  HEALTH MAINTENANCE: Social History  Substance Use Topics  . Smoking status: Former Smoker    Packs/day: 1.50    Years: 25.00    Types: Cigarettes    Quit date: 10/20/1983  .  Smokeless tobacco: Never Used  . Alcohol use No     Colonoscopy:  PAP:  Bone density:  Lipid panel:  Allergies  Allergen Reactions  . Adhesive [Tape] Other (See Comments)    Skin blistered (PAPER TAPE OK)  . Hydroxychloroquine Sulfate Rash  . Ibandronate Sodium Rash  . Risedronate Sodium Rash    Current Outpatient Prescriptions  Medication Sig Dispense Refill  . ALPRAZolam (XANAX) 0.25 MG tablet Take 1 tablet (0.25 mg  total) by mouth 2 (two) times daily as needed for anxiety. 60 tablet 0  . cholecalciferol (VITAMIN D) 1000 units tablet Take 1,000 Units by mouth daily.    Marland Kitchen aspirin EC 81 MG tablet Take 81 mg by mouth daily.    . celecoxib (CELEBREX) 200 MG capsule 1 capsule by mouth twice a day when necessary with meals (Patient not taking: Reported on 05/26/2017) 135 capsule 0  . HYDROcodone-acetaminophen (NORCO) 7.5-325 MG tablet     . ibuprofen (ADVIL,MOTRIN) 200 MG tablet Take 400 mg by mouth every 8 (eight) hours as needed (for pain.).    Marland Kitchen levothyroxine (SYNTHROID, LEVOTHROID) 100 MCG tablet Take 1 tablet (100 mcg total) by mouth daily. 30 tablet 3   No current facility-administered medications for this visit.     OBJECTIVE: Vitals:   05/26/17 1144  BP: (!) 148/79  Pulse: 89  Temp: 98.7 F (37.1 C)     Body mass index is 29.61 kg/m.    ECOG FS:0 - Asymptomatic  General: Ill-appearing, no acute distress. Eyes: Pink conjunctiva, anicteric sclera. Breasts: Left mastectomy with JP drain in place. Lungs: Clear to auscultation bilaterally. Heart: Regular rate and rhythm. No rubs, murmurs, or gallops. Abdomen: Soft, nontender, nondistended. No organomegaly noted, normoactive bowel sounds. Musculoskeletal: No edema, cyanosis, or clubbing. Neuro: Alert, answering all questions appropriately. Cranial nerves grossly intact. Skin: No rashes or petechiae noted. Psych: Normal affect.   LAB RESULTS:  Lab Results  Component Value Date   NA 140 05/25/2017   K 4.0 05/25/2017   CL 105 05/25/2017   CO2 29 05/25/2017   GLUCOSE 97 05/25/2017   BUN 17 05/25/2017   CREATININE 0.76 05/25/2017   CALCIUM 9.2 05/25/2017   PROT 6.9 05/25/2017   ALBUMIN 3.7 05/25/2017   AST 14 05/25/2017   ALT 11 05/25/2017   ALKPHOS 92 05/25/2017   BILITOT 0.3 05/25/2017   GFRNONAA >60 04/14/2017   GFRAA >60 04/14/2017    Lab Results  Component Value Date   WBC 6.8 05/25/2017   NEUTROABS 4.6 05/25/2017   HGB 13.2  05/25/2017   HCT 40.2 05/25/2017   MCV 98.0 05/25/2017   PLT 241.0 05/25/2017     STUDIES: Dg Chest 2 View  Result Date: 05/26/2017 CLINICAL DATA:  Tachycardia. One week postop from left mastectomy for breast carcinoma. EXAM: CHEST  2 VIEW COMPARISON:  03/10/2017 FINDINGS: Right-sided Port-A-Cath remains in appropriate position. Surgical drain seen in the left chest wall soft tissues. Heart size is normal. Stable ectasia of thoracic aorta. Both lungs are clear. No pneumothorax visualized. IMPRESSION: No active cardiopulmonary disease. Electronically Signed   By: Earle Gell M.D.   On: 05/26/2017 09:20   Nm Sentinel Node Injection  Result Date: 05/18/2017 CLINICAL DATA:  Left breast cancer. EXAM: NUCLEAR MEDICINE BREAST LYMPHOSCINTIGRAPHY TECHNIQUE: Intradermal injection of radiopharmaceutical was performed at the 12 o'clock, 3 o'clock, 6 o'clock, and 9 o'clock positions around the left nipple. The patient was then sent to the operating room where the sentinel node(s) were identified and  removed by the surgeon. RADIOPHARMACEUTICALS:  Total of 1 mCi Millipore-filtered Technetium-11msulfur colloid, injected in four aliquots of 0.25 mCi each. IMPRESSION: Uncomplicated intradermal injection of a total of 1 mCi Technetium-972mulfur colloid for purposes of sentinel node identification. Electronically Signed   By: GlAletta Edouard.D.   On: 05/18/2017 10:10   Dg Bone Density  Result Date: 05/06/2017 EXAM: DUAL X-RAY ABSORPTIOMETRY (DXA) FOR BONE MINERAL DENSITY IMPRESSION: Dear DR. MaTurbeville Correctional Institution InfirmaryYour patient JoAllexa Acoffompleted a BMD test on 05/06/2017 using the LuEast Hopeanalysis version: 14.10) manufactured by GEEMCORThe following summarizes the results of our evaluation. PATIENT BIOGRAPHICAL: Name: IsAlyzah, Pellyatient ID: [id number] Date: 0125-Sep-1947eight: 66.0 in. Gender: Female Exam Date: 05/06/2017 Weight: 173.3 lbs. Indications: Breast CA, Caucasian, Family Hx of  Osteoporosis, Height Loss, High Risk Meds, History of Chemo, History of Fracture (Adult), Hypothyroid, Osteoporotic, Postmenopausal, Previous Smoker, Rheumatoid Arthritis, right hip replacement, Secondary Osteoporosis, Vitamin D Deficiency Fractures: Ribs, Right foot Treatments: ASPRIN 81 MG, Calcium, levothyroxin, Vitamin D ASSESSMENT: The BMD measured at Femur Neck is 0.711 g/cm2 with a T-score of -2.4. This patient is considered OSTEOPENIC according to WoCedar HillsWInsp[facility] Bridgetoncriteria. Lumbar spine was not utilized due to advanced degenerative changes. Site Region Measured Measured WHO Young Adult BMD Date       Age      Classification T-score Left Femur Neck 05/06/2017 69.4 Osteopenia -2.4 0.711 g/cm2 Left Forearm Radius 33% 05/06/2017 69.4 Osteopenia -2.0 0.705 g/cm2 World Health Organization (W[facility] Inccriteria for post-menopausal, Caucasian Women: Normal:       T-score at or above -1 SD Osteopenia:   T-score between -1 and -2.5 SD Osteoporosis: T-score at or below -2.5 SD RECOMMENDATIONS: NaWindsorecommends that FDA-approved medical therapies be considered in postmenopausal women and men age 3541r older with a: 1. Hip or vertebral (clinical or morphometric) fracture. 2. T-score of < -2.5 at the spine or hip. 3. Ten-year fracture probability by FRAX of 3% or greater for hip fracture or 20% or greater for major osteoporotic fracture. All treatment decisions require clinical judgment and consideration of individual patient factors, including patient preferences, co-morbidities, previous drug use, risk factors not captured in the FRAX model (e.g. falls, vitamin D deficiency, increased bone turnover, interval significant decline in bone density) and possible under - or over-estimation of fracture risk by FRAX. All patients should ensure an adequate intake of dietary calcium (1200 mg/d) and vitamin D (800 IU daily) unless contraindicated. FOLLOW-UP: People with diagnosed cases of  osteoporosis or at high risk for fracture should have regular bone mineral density tests. For patients eligible for Medicare, routine testing is allowed once every 2 years. The testing frequency can be increased to one year for patients who have rapidly progressing disease, those who are receiving or discontinuing medical therapy to restore bone mass, or have additional risk factors. I have reviewed this report, and agree with the above findings. Mark A. BoThornton PapasM.D. [facility] Pcadiology Dear DR. MaTriad HospitalsYour patient JoLISSET KETCHEMompleted a FRAX assessment on 05/06/2017 using the LuGlen Ridgeanalysis version: 14.10) manufactured by GEEMCORThe following summarizes the results of our evaluation. PATIENT BIOGRAPHICAL: Name: IsKhalil, Beloteatient ID: [id number] Date: [birth year]eight:    66.0 in. Gender:     Female    [age]       Weight:    173.3 lbs. Ethnicity:  White                Exam Date: 05/06/2017 FRAX* RESULTS:  (version: 3.5) 10-year Probability of Fracture1 Major Osteoporotic Fracture2 Hip Fracture 26.7% 6.6% Population: [place] (Caucasian) Risk Factors: History of Fracture (Adult), Rheumatoid Arthritis, Secondary Osteoporosis Based on Femur (Left) Neck BMD 1 -The 10-year probability of fracture may be lower than reported if the patient has received treatment. 2 -Major Osteoporotic Fracture: Clinical Spine, Forearm, Hip or Shoulder *FRAX is a Materials engineer of the [employer] for Metabolic Bone Disease, a [place] (WHO) [facility]. ASSESSMENT: The probability of a major osteoporotic fracture is 26.7% within the next ten years. The probability of a hip fracture is 6.6% within the next ten years. I have reviewed this report and agree with the above findings. Mark A. Thornton Papas, M.D. [facility] Electronically Signed   By: Lavonia Dana M.D.   On: 05/06/2017 16:14   US Breast Complete Uni Left Inc  Axilla  Result Date: 05/13/2017 Ultrasound examination of the left breast showed a significant decrease in size of the dominant mass to 1.5 cm in maximum dimension. This is about a 50-60% reduction in volume compared to pre-chemotherapy measurements. Examination of the left axilla shows a single prominent node in the lower aspect of the axilla measuring up to 1.03 cm. Cortical thickening is modest to 0.2 mm, previously 5 mm suggesting this was a node involved with malignancy. BI-RADS-6.   ASSESSMENT: Pathologic stage IA ER/PR positive, HER-2 negative invasive carcinoma of the upper-outer quadrant of the left breast  PLAN:    1. Pathologic stage IA ER/PR positive, HER-2 negative invasive carcinoma of the upper-outer quadrant of the left breast: Patient completed only 3 cycles of Adriamycin and Cytoxan on March 24, 2017. She declined any further neoadjuvant chemotherapy. Because she had a full mastectomy, XRT is not necessary. After lengthy discussion with the patient she has agreed to initiate letrozole which she will take for 5 years completing in August 2023.  She has expressed understanding that by not completing treatment as recommended, she is at higher risk for recurrent disease. No further intervention is needed. Return to clinic in 3 months for further evaluation.   2. Rheumatoid arthritis: Patient has discontinued Enbrel since the diagnosis of her malignancy. Since this is a solid tumor, okay to restart from an oncology standpoint once chemotherapy has been completed.  3. Osteopenia: Patient had a bone mineral density on May 06, 2017 revealed a T score of -2.4. Continue monitoring treatment per rheumatology.  Approximately 30 minutes spent in discussion of which greater than 50% was consultation.  Patient expressed understanding and was in agreement with this plan. She also understands that She can call clinic at any time with any questions, concerns, or complaints.   Cancer  Staging Malignant neoplasm of upper-outer quadrant of left breast in female, estrogen receptor negative ([place]) Staging form: Breast, AJCC 8th Edition - Clinical stage from 02/04/2017: Stage IB (cT2, cN0, cM0, G2, ER: Positive, PR: Positive, HER2: Negative) - Signed by Lloyd Huger, MD on 02/15/2017 - Pathologic stage from 05/29/2017: Stage IA (pT2, pN0, cM0, G2, ER: Positive, PR: Positive, HER2: Negative) - Signed by Lloyd Huger, MD on 05/29/2017   Lloyd Huger, MD   05/29/2017 4:07 PM

## 2017-05-27 ENCOUNTER — Telehealth: Payer: Self-pay | Admitting: *Deleted

## 2017-05-27 MED ORDER — LEVOTHYROXINE SODIUM 100 MCG PO TABS: 100.0000 ug | ORAL_TABLET | Freq: Every day | ORAL | 3 refills | Status: DC

## 2017-05-27 NOTE — Telephone Encounter (Signed)
-----   Message from Abner Greenspan, MD sent at 05/27/2017  8:38 AM EDT ----- Please let her know TSH is low and that may explain the high heart rate  She takes 137 mcg daily of levothyroxine  Hold it for one day then Then take 100 mcg daily  Please call in levothyroxine 100 mcg 1 po qd #30 3 ref  F/u in 3-4 weeks- we will check her pulse and check tsh    Please ask how she is feeling also, thanks

## 2017-05-27 NOTE — Telephone Encounter (Signed)
Pt notified of lab results and Dr. Marliss Coots comments/ instructions. Pt will hold levothyroxine today and new Rx sent to pharmacy. F/u appt scheduled with pt. Pt said she has been having body aches and HA but she is taking it easy today and resting at home. Pt said hopefully getting her TSH back in normal range will help her feel a little better

## 2017-05-31 ENCOUNTER — Encounter: Payer: Self-pay | Admitting: General Surgery

## 2017-05-31 ENCOUNTER — Ambulatory Visit (INDEPENDENT_AMBULATORY_CARE_PROVIDER_SITE_OTHER): Payer: PPO | Admitting: General Surgery

## 2017-05-31 VITALS — BP 170/90 | HR 93 | Resp 12 | Ht 63.0 in | Wt 161.0 lb

## 2017-05-31 DIAGNOSIS — Z171 Estrogen receptor negative status [ER-]: Secondary | ICD-10-CM

## 2017-05-31 DIAGNOSIS — C50412 Malignant neoplasm of upper-outer quadrant of left female breast: Secondary | ICD-10-CM

## 2017-05-31 NOTE — Patient Instructions (Signed)
Patient to return on Friday.

## 2017-05-31 NOTE — Progress Notes (Signed)
Patient ID: Samantha Valentine, female   DOB: [birth year], 71 y.o.   MRN: [id number]  No chief complaint on file.   HPI Samantha Valentine is a 71 y.o. female here today for her follow up left mastectomy done on 05/18/2017. Patient states the drain site is red, swollen and a bad smell. Drain sheet present.  She is here with her friend Harden Mo. HPI  Past Medical History:  Diagnosis Date  . Arthritis    RA  . Breast mass 1 year   . Cancer ([place]) 01/29/2017   left breast INVASIVE MAMMARY CARCINOMA, ER/PR positive  . Collagen vascular disease (HCC)    Rhematoid Arthritis  . DDD (degenerative disc disease)    in neck  . Depression   . Fibromyalgia   . GERD (gastroesophageal reflux disease)    NO MEDS  . History of kidney stones    MULTIPLE KIDNEY STONES BIL  . Hypothyroidism   . Migraine   . Osteoporosis   . Rheumatoid arthritis [facility])     Past Surgical History:  Procedure Laterality Date  . BREAST BIOPSY Left 01/29/2017   US guided biopsy INVASIVE MAMMARY CARCINOMA  . JOINT REPLACEMENT Right 2003   total hip replacement  . LITHOTRIPSY  1997   kidney stone  . MASTECTOMY W/ SENTINEL NODE BIOPSY Left 05/18/2017   Procedure: MASTECTOMY WITH SENTINEL LYMPH NODE BIOPSY;  Surgeon: Robert Bellow, MD;  Location: [facility] ORS;  Service: General;  Laterality: Left;  . PORTACATH PLACEMENT Right 02/15/2017   Procedure: INSERTION PORT-A-CATH;  Surgeon: Robert Bellow, MD;  Location: [facility] ORS;  Service: General;  Laterality: Right;  . TONSILLECTOMY  1970    Family History  Problem Relation Age of Onset  . Alcohol abuse Father   . Diabetes Father   . Stroke Father   . Hypertension Mother   . Endometrial cancer Mother   . Osteoarthritis Mother   . Breast cancer Paternal Aunt 48  . Liver disease Sister   . Breast cancer Maternal Aunt   . Rheum arthritis Maternal Grandmother   . Diabetes Paternal Grandmother   . Parkinson's disease Paternal Grandfather     Social  History Social History  Substance Use Topics  . Smoking status: Former Smoker    Packs/day: 1.50    Years: 25.00    Types: Cigarettes    Quit date: 10/20/1983  . Smokeless tobacco: Never Used  . Alcohol use No    Allergies  Allergen Reactions  . Adhesive [Tape] Other (See Comments)    Skin blistered (PAPER TAPE OK)  . Hydroxychloroquine Sulfate Rash  . Ibandronate Sodium Rash  . Risedronate Sodium Rash    Current Outpatient Prescriptions  Medication Sig Dispense Refill  . ALPRAZolam (XANAX) 0.25 MG tablet Take 1 tablet (0.25 mg total) by mouth 2 (two) times daily as needed for anxiety. 60 tablet 0  . aspirin EC 81 MG tablet Take 81 mg by mouth daily.    . celecoxib (CELEBREX) 200 MG capsule 1 capsule by mouth twice a day when necessary with meals (Patient not taking: Reported on 05/26/2017) 135 capsule 0  . cholecalciferol (VITAMIN D) 1000 units tablet Take 1,000 Units by mouth daily.    Marland Kitchen HYDROcodone-acetaminophen (NORCO) 7.5-325 MG tablet     . ibuprofen (ADVIL,MOTRIN) 200 MG tablet Take 400 mg by mouth every 8 (eight) hours as needed (for pain.).    Marland Kitchen levothyroxine (SYNTHROID, LEVOTHROID) 100 MCG tablet Take 1 tablet (100 mcg total) by mouth daily.  30 tablet 3   No current facility-administered medications for this visit.     Review of Systems Review of Systems  Constitutional: Negative.   Cardiovascular: Negative.     Blood pressure (!) 170/90, pulse 93, resp. rate 12, height 5\' 3"  (1.6 m), weight 161 lb (73 kg).  Physical Exam Physical Exam  Constitutional: She is oriented to person, place, and time. She appears well-developed.  Pulmonary/Chest:    Neurological: She is alert and oriented to person, place, and time.  Skin: Skin is warm and dry.    Data Reviewed Drain volumes steadily decreasing, still about 50 mL per day.  Assessment    Good progress post mastectomy.    Plan    Culture obtained from drain site, we'll not start antibiotics based on today's  exam.    Patient to return in 4 days on Friday, earlier of increasing redness or pain noted.  HPI, Physical Exam, Assessment and Plan have been scribed under the direction and in the presence of Hervey Ard, MD.  Gaspar Cola, CMA  I have completed the exam and reviewed the above documentation for accuracy and completeness.  I agree with the above.  Haematologist has been used and any errors in dictation or transcription are unintentional.  Hervey Ard, M.D., F.A.C.S.  Robert Bellow 06/01/2017, 6:49 AM

## 2017-06-03 ENCOUNTER — Telehealth: Payer: Self-pay

## 2017-06-03 MED ORDER — SULFAMETHOXAZOLE-TRIMETHOPRIM 800-160 MG PO TABS: 1.0000 | ORAL_TABLET | Freq: Two times a day (BID) | ORAL | 0 refills | Status: DC

## 2017-06-03 NOTE — Telephone Encounter (Signed)
Notified patient as instructed, patient pleased. Patient following up tomorrow. Will start her antibiotics today.

## 2017-06-03 NOTE — Telephone Encounter (Signed)
-----   Message from Robert Bellow, MD sent at 06/03/2017  8:48 AM EDT ----- Please send an RX for Septra DS, one po BID for ten days. Notify patient to start today. Thanks.  ----- Message ----- From: Lavone Neri Lab Results In Sent: 06/02/2017   7:38 AM To: Robert Bellow, MD

## 2017-06-04 ENCOUNTER — Encounter: Payer: Self-pay | Admitting: General Surgery

## 2017-06-04 ENCOUNTER — Ambulatory Visit (INDEPENDENT_AMBULATORY_CARE_PROVIDER_SITE_OTHER): Payer: PPO | Admitting: General Surgery

## 2017-06-04 VITALS — BP 130/78 | HR 78 | Temp 98.5°F | Resp 14 | Ht 63.0 in | Wt 172.0 lb

## 2017-06-04 DIAGNOSIS — Z171 Estrogen receptor negative status [ER-]: Secondary | ICD-10-CM

## 2017-06-04 DIAGNOSIS — C50412 Malignant neoplasm of upper-outer quadrant of left female breast: Secondary | ICD-10-CM

## 2017-06-04 LAB — ANAEROBIC AND AEROBIC CULTURE

## 2017-06-04 MED ORDER — LETROZOLE 2.5 MG PO TABS: 2.5000 mg | ORAL_TABLET | Freq: Every day | ORAL | 11 refills | Status: DC

## 2017-06-04 NOTE — Patient Instructions (Addendum)
-  Follow up on Tuesday -Finish you antibiotics OK to speak with you Rheumatologist about starting your biologic.   * Call in one month with update on how you are doing with the Letrozol.  You may shower. You can use a warm/hot compress/heating pad to area for comfort. Be sure to check the temperature.

## 2017-06-04 NOTE — Progress Notes (Signed)
Patient ID: Samantha Valentine, female   DOB: [birth year], 71 y.o.   MRN: [id number]  Chief Complaint  Patient presents with  . Follow-up    HPI Samantha Valentine is a 71 y.o. female Samantha Valentine is a 71 y.o. female here today for her follow up left mastectomy done on 05/18/2017. Drain sheet present. She has started her Septra DS for infection at her drain site. She reports that she is feeling well.  She is here with her friend Harden Mo HPI  Past Medical History:  Diagnosis Date  . Arthritis    RA  . Breast mass 1 year   . Cancer ([place]) 01/29/2017   left breast INVASIVE MAMMARY CARCINOMA, ER/PR positive  . Collagen vascular disease (HCC)    Rhematoid Arthritis  . DDD (degenerative disc disease)    in neck  . Depression   . Fibromyalgia   . GERD (gastroesophageal reflux disease)    NO MEDS  . History of kidney stones    MULTIPLE KIDNEY STONES BIL  . Hypothyroidism   . Migraine   . Osteoporosis   . Rheumatoid arthritis [facility])     Past Surgical History:  Procedure Laterality Date  . BREAST BIOPSY Left 01/29/2017   US guided biopsy INVASIVE MAMMARY CARCINOMA  . JOINT REPLACEMENT Right 2003   total hip replacement  . LITHOTRIPSY  1997   kidney stone  . MASTECTOMY W/ SENTINEL NODE BIOPSY Left 05/18/2017   Procedure: MASTECTOMY WITH SENTINEL LYMPH NODE BIOPSY;  Surgeon: Robert Bellow, MD;  Location: [facility] ORS;  Service: General;  Laterality: Left;  . PORTACATH PLACEMENT Right 02/15/2017   Procedure: INSERTION PORT-A-CATH;  Surgeon: Robert Bellow, MD;  Location: [facility] ORS;  Service: General;  Laterality: Right;  . TONSILLECTOMY  1970    Family History  Problem Relation Age of Onset  . Alcohol abuse Father   . Diabetes Father   . Stroke Father   . Hypertension Mother   . Endometrial cancer Mother   . Osteoarthritis Mother   . Breast cancer Paternal Aunt 49  . Liver disease Sister   . Breast cancer Maternal Aunt   . Rheum arthritis Maternal Grandmother    . Diabetes Paternal Grandmother   . Parkinson's disease Paternal Grandfather     Social History Social History  Substance Use Topics  . Smoking status: Former Smoker    Packs/day: 1.50    Years: 25.00    Types: Cigarettes    Quit date: 10/20/1983  . Smokeless tobacco: Never Used  . Alcohol use No    Allergies  Allergen Reactions  . Adhesive [Tape] Other (See Comments)    Skin blistered (PAPER TAPE OK)  . Hydroxychloroquine Sulfate Rash  . Ibandronate Sodium Rash  . Risedronate Sodium Rash    Current Outpatient Prescriptions  Medication Sig Dispense Refill  . ALPRAZolam (XANAX) 0.25 MG tablet Take 1 tablet (0.25 mg total) by mouth 2 (two) times daily as needed for anxiety. 60 tablet 0  . aspirin EC 81 MG tablet Take 81 mg by mouth daily.    . celecoxib (CELEBREX) 200 MG capsule 1 capsule by mouth twice a day when necessary with meals 135 capsule 0  . cholecalciferol (VITAMIN D) 1000 units tablet Take 1,000 Units by mouth daily.    Marland Kitchen HYDROcodone-acetaminophen (NORCO) 7.5-325 MG tablet     . ibuprofen (ADVIL,MOTRIN) 200 MG tablet Take 400 mg by mouth every 8 (eight) hours as needed (for pain.).    Marland Kitchen levothyroxine (  SYNTHROID, LEVOTHROID) 100 MCG tablet Take 1 tablet (100 mcg total) by mouth daily. 30 tablet 3  . sulfamethoxazole-trimethoprim (BACTRIM DS,SEPTRA DS) 800-160 MG tablet Take 1 tablet by mouth 2 (two) times daily. 20 tablet 0  . letrozole (FEMARA) 2.5 MG tablet Take 1 tablet (2.5 mg total) by mouth daily. 30 tablet 11   No current facility-administered medications for this visit.     Review of Systems Review of Systems  Constitutional: Negative.   Respiratory: Negative.   Cardiovascular: Negative.     Blood pressure 130/78, pulse 78, temperature 98.5 F (36.9 C), resp. rate 14, height 5\' 3"  (1.6 m), weight 172 lb (78 kg).  Physical Exam Physical Exam  Pulmonary/Chest:      Data Reviewed  Drainage sheet record shows volumes of fallen to about 30 mL per  day. Drain was removed without incident.  Culture from last visit: Aerobic Culture Final report    Result 1 Staphylococcus aureus       Antimicrobial Susceptibility Comment   Comment:   ** S = Susceptible; I = Intermediate; R = Resistant **           P = Positive; N = Negative        MICS are expressed in micrograms per mL   Antibiotic         RSLT#1  RSLT#2  RSLT#3  RSLT#4  Ciprofloxacin         S  Clindamycin          S  Erythromycin          S  Gentamicin           S  Levofloxacin          S  Linezolid           S  Moxifloxacin          S  Oxacillin           S  Quinupristin/Dalfopristin   S  Rifampin            S  Tetracycline          S  Trimethoprim/Sulfa       S  Vancomycin           S      Assessment    Doing well post left mastectomy area  Drain site superficial infection, on appropriate antibiotic.  Candidate for antiestrogen therapy.    Plan    Review of Dr. Gary Fleet notes from 05/26/2017 reported a plan to begin Femara 2.5 mg daily. Prescription for same has been sent to her pharmacy.  We'll plan on rechecking the wound in 4 days she has been asked to complete her course of Bactrim.  She's been encouraged to contact her rheumatologist to reinitiate biologic therapy for her rheumatoid arthritis the week of August 27.    HPI, Physical Exam, Assessment and Plan have been scribed under the direction and in the presence of Robert Bellow, MD  Concepcion Living, LPN  I have completed the exam and reviewed the above documentation for accuracy and completeness.  I agree with the above.  Haematologist has been used and any errors in dictation or transcription are unintentional.  Hervey Ard, M.D., F.A.C.S.   Robert Bellow 06/04/2017, 9:45 AM

## 2017-06-07 ENCOUNTER — Telehealth: Payer: Self-pay | Admitting: Rheumatology

## 2017-06-07 NOTE — Telephone Encounter (Signed)
Patient has gone thru surgery post 3 weeks for cancer. Patient thinks she is ready to start back on something for RA. Please call patient to discuss.

## 2017-06-08 ENCOUNTER — Ambulatory Visit (INDEPENDENT_AMBULATORY_CARE_PROVIDER_SITE_OTHER): Payer: PPO | Admitting: General Surgery

## 2017-06-08 ENCOUNTER — Encounter: Payer: Self-pay | Admitting: General Surgery

## 2017-06-08 VITALS — BP 130/80 | HR 88 | Resp 14 | Ht 62.0 in | Wt 171.0 lb

## 2017-06-08 DIAGNOSIS — C50412 Malignant neoplasm of upper-outer quadrant of left female breast: Secondary | ICD-10-CM

## 2017-06-08 DIAGNOSIS — Z171 Estrogen receptor negative status [ER-]: Secondary | ICD-10-CM

## 2017-06-08 NOTE — Telephone Encounter (Signed)
Patient has been scheduled for 06/11/17 @ 8:15 am

## 2017-06-08 NOTE — Telephone Encounter (Signed)
She will need appointment to discuss new medication.

## 2017-06-08 NOTE — Telephone Encounter (Signed)
Patient states she has not been on Enbrel since March 2018. Patient states she has been on Chemotherapy which kept the RA at [place]. Patient states she is no longer on chemotherapy and last chemotherapy was 03/24/17. Patient had a left mastectomy and 3 lymph nodes. Patient states she has been put on Letrozole. Patient states she is needing to be put back on something for the RA. Patient states she has been given clearance from her surgeon to start a new medication. Please advise. Patient's follow up appointment is not until October 2018.

## 2017-06-08 NOTE — Progress Notes (Signed)
Patient ID: Samantha Valentine, female   DOB: 01/10/1946, 71 y.o.   MRN: [id number]  Chief Complaint  Patient presents with  . Routine Post Op    left mastectomy    HPI Samantha Valentine is a 72 y.o. female here today for her follow up left mastectomy done on 05/18/2017. Patient states she is doing well. She is here with her friend Harden Mo HPI  Past Medical History:  Diagnosis Date  . Arthritis    RA  . Breast mass 1 year   . Cancer ([place]) 01/29/2017   left breast INVASIVE MAMMARY CARCINOMA, ER/PR positive  . Collagen vascular disease (HCC)    Rhematoid Arthritis  . DDD (degenerative disc disease)    in neck  . Depression   . Fibromyalgia   . GERD (gastroesophageal reflux disease)    NO MEDS  . History of kidney stones    MULTIPLE KIDNEY STONES BIL  . Hypothyroidism   . Migraine   . Osteoporosis   . Rheumatoid arthritis [facility])     Past Surgical History:  Procedure Laterality Date  . BREAST BIOPSY Left 01/29/2017   US guided biopsy INVASIVE MAMMARY CARCINOMA  . JOINT REPLACEMENT Right 2003   total hip replacement  . LITHOTRIPSY  1997   kidney stone  . MASTECTOMY W/ SENTINEL NODE BIOPSY Left 05/18/2017   Procedure: MASTECTOMY WITH SENTINEL LYMPH NODE BIOPSY;  Surgeon: Robert Bellow, MD;  Location: [facility] ORS;  Service: General;  Laterality: Left;  . PORTACATH PLACEMENT Right 02/15/2017   Procedure: INSERTION PORT-A-CATH;  Surgeon: Robert Bellow, MD;  Location: [facility] ORS;  Service: General;  Laterality: Right;  . TONSILLECTOMY  1970    Family History  Problem Relation Age of Onset  . Alcohol abuse Father   . Diabetes Father   . Stroke Father   . Hypertension Mother   . Endometrial cancer Mother   . Osteoarthritis Mother   . Breast cancer Paternal Aunt 46  . Liver disease Sister   . Breast cancer Maternal Aunt   . Rheum arthritis Maternal Grandmother   . Diabetes Paternal Grandmother   . Parkinson's disease Paternal Grandfather     Social  History Social History  Substance Use Topics  . Smoking status: Former Smoker    Packs/day: 1.50    Years: 25.00    Types: Cigarettes    Quit date: 10/20/1983  . Smokeless tobacco: Never Used  . Alcohol use No    Allergies  Allergen Reactions  . Adhesive [Tape] Other (See Comments)    Skin blistered (PAPER TAPE OK)  . Hydroxychloroquine Sulfate Rash  . Ibandronate Sodium Rash  . Risedronate Sodium Rash    Current Outpatient Prescriptions  Medication Sig Dispense Refill  . ALPRAZolam (XANAX) 0.25 MG tablet Take 1 tablet (0.25 mg total) by mouth 2 (two) times daily as needed for anxiety. 60 tablet 0  . aspirin EC 81 MG tablet Take 81 mg by mouth daily.    . celecoxib (CELEBREX) 200 MG capsule 1 capsule by mouth twice a day when necessary with meals 135 capsule 0  . cholecalciferol (VITAMIN D) 1000 units tablet Take 1,000 Units by mouth daily.    Marland Kitchen HYDROcodone-acetaminophen (NORCO) 7.5-325 MG tablet     . ibuprofen (ADVIL,MOTRIN) 200 MG tablet Take 400 mg by mouth every 8 (eight) hours as needed (for pain.).    Marland Kitchen letrozole (FEMARA) 2.5 MG tablet Take 1 tablet (2.5 mg total) by mouth daily. 30 tablet 11  .  levothyroxine (SYNTHROID, LEVOTHROID) 100 MCG tablet Take 1 tablet (100 mcg total) by mouth daily. 30 tablet 3  . sulfamethoxazole-trimethoprim (BACTRIM DS,SEPTRA DS) 800-160 MG tablet Take 1 tablet by mouth 2 (two) times daily. 20 tablet 0   No current facility-administered medications for this visit.     Review of Systems Review of Systems  Constitutional: Negative.   Respiratory: Negative.   Cardiovascular: Negative.     Blood pressure 130/80, pulse 88, resp. rate 14, height 5\' 2"  (1.575 m), weight 171 lb (77.6 kg).  Physical Exam Physical Exam  Constitutional: She is oriented to person, place, and time. She appears well-developed and well-nourished.  Pulmonary/Chest:    Neurological: She is alert and oriented to person, place, and time.  Skin: Skin is warm and dry.       Data Reviewed Examination showed slight fullness in the lateral aspect of the mastectomy. ChloraPrep, 1 mL 1% plain Xylocaine, aspiration completed without incident, 38 mL clear fluid.  Assessment    Excellent shoulder mobility status post mastectomy. Modest fluid collection now one week post drain removal.    Plan         Patient to return in one week.  The patient is aware to call back for any questions or concerns.  Patient is arranging follow up with rheumatology to restart her biologic agent.   HPI, Physical Exam, Assessment and Plan have been scribed under the direction and in the presence of Hervey Ard, MD.  Gaspar Cola, CMA  I have completed the exam and reviewed the above documentation for accuracy and completeness.  I agree with the above.  Haematologist has been used and any errors in dictation or transcription are unintentional.  Hervey Ard, M.D., F.A.C.S.   Robert Bellow 06/08/2017, 8:25 PM

## 2017-06-08 NOTE — Patient Instructions (Signed)
  Patient to return in one week. The patient is aware to call back for any questions or concerns. 

## 2017-06-11 ENCOUNTER — Telehealth: Payer: Self-pay | Admitting: Radiology

## 2017-06-11 ENCOUNTER — Ambulatory Visit (INDEPENDENT_AMBULATORY_CARE_PROVIDER_SITE_OTHER): Payer: PPO | Admitting: Rheumatology

## 2017-06-11 ENCOUNTER — Encounter: Payer: Self-pay | Admitting: Rheumatology

## 2017-06-11 VITALS — BP 142/80 | HR 88 | Resp 14 | Ht 64.0 in | Wt 172.0 lb

## 2017-06-11 DIAGNOSIS — M797 Fibromyalgia: Secondary | ICD-10-CM | POA: Diagnosis not present

## 2017-06-11 DIAGNOSIS — M0579 Rheumatoid arthritis with rheumatoid factor of multiple sites without organ or systems involvement: Secondary | ICD-10-CM

## 2017-06-11 DIAGNOSIS — Z79899 Other long term (current) drug therapy: Secondary | ICD-10-CM | POA: Diagnosis not present

## 2017-06-11 DIAGNOSIS — Z853 Personal history of malignant neoplasm of breast: Secondary | ICD-10-CM

## 2017-06-11 MED ORDER — PREDNISONE 5 MG PO TABS: ORAL_TABLET | ORAL | 0 refills | Status: DC

## 2017-06-11 NOTE — Patient Instructions (Addendum)
Standing Labs We placed an order today for your standing lab work.    Please come back and get your standing labs in one month after starting Sulfasalazine  We have open lab Monday through Friday from 8:30-11:30 AM and 1:30-4 PM at the office of Dr. Bo Merino.   The office is located at [address], [place], [place], [place] [address] No appointment is necessary.   Labs are drawn by [facility].  You may receive a bill from [facility] for your lab work. If you have any questions regarding directions or hours of operation,  please call [phone].     We will call you with the G6PD lab results, when we call you with the results, we will send in the Sulfasalazine   Sulfasalazine delayed release tablets What is this medicine? SULFASALAZINE (sul fa SAL a zeen) is for ulcerative colitis and certain types of rheumatoid arthritis. This medicine may be used for other purposes; ask your health care provider or pharmacist if you have questions. COMMON BRAND NAME(S): Azulfidine En-Tabs, Sulfazine EC What should I tell my health care provider before I take this medicine? They need to know if you have any of these conditions: -asthma -blood disorders or anemia -glucose-6-phosphate dehydrogenase (G6PD) deficiency -intestinal obstruction -kidney disease -liver disease -porphyria -urinary tract obstruction -an unusual reaction to sulfasalazine, sulfa drugs, salicylates, other medicines, foods, dyes, or preservatives -pregnant or trying to get pregnant -breast-feeding How should I use this medicine? Take this medicine by mouth with a full glass of water. Follow the directions on the prescription label. If the medicine upsets your stomach, take it with food or milk. Do not crush or chew the tablets. Swallow the tablets whole. Take your medicine at regular intervals. Do not take your medicine more often than directed. Do not stop taking except on your doctor's advice. Talk to your pediatrician  regarding the use of this medicine in children. Special care may be needed. While this drug may be prescribed for children as young as 6 years for selected conditions, precautions do apply. Patients over 56 years old may have a stronger reaction and need a smaller dose. Overdosage: If you think you have taken too much of this medicine contact a poison control center or emergency room at once. NOTE: This medicine is only for you. Do not share this medicine with others. What if I miss a dose? If you miss a dose, take it as soon as you can. If it is almost time for your next dose, take only that dose. Do not take double or extra doses. What may interact with this medicine? -digoxin -folic acid This list may not describe all possible interactions. Give your health care provider a list of all the medicines, herbs, non-prescription drugs, or dietary supplements you use. Also tell them if you smoke, drink alcohol, or use illegal drugs. Some items may interact with your medicine. What should I watch for while using this medicine? Visit your doctor or health care professional for regular checks on your progress. You will need frequent blood and urine checks. This medicine can make you more sensitive to the sun. Keep out of the sun. If you cannot avoid being in the sun, wear protective clothing and use sunscreen. Do not use sun lamps or tanning beds/booths. Drink plenty of water while taking this medicine. Tell your doctor if you see the tablet in your stools. Your body may not be absorbing the medicine. What side effects may I notice from receiving this  medicine? Side effects that you should report to your doctor or health care professional as soon as possible: -allergic reactions like skin rash, itching or hives, swelling of the face, lips, or tongue -fever, chills, or any other sign of infection -painful, difficult, or reduced urination -redness, blistering, peeling or loosening of the skin, including  inside the mouth -severe stomach pain -unusual bleeding or bruising -unusually weak or tired -yellowing of the skin or eyes Side effects that usually do not require medical attention (report to your doctor or health care professional if they continue or are bothersome): -headache -loss of appetite -nausea, vomiting -orange color to the urine -reduced sperm count This list may not describe all possible side effects. Call your doctor for medical advice about side effects. You may report side effects to FDA at 1-800-FDA-1088. Where should I keep my medicine? Keep out of the reach of children. Store at room temperature between 15 and 30 degrees C (59 and 86 degrees F). Keep container tightly closed. Throw away any unused medicine after the expiration date. NOTE: This sheet is a summary. It may not cover all possible information. If you have questions about this medicine, talk to your doctor, pharmacist, or health care provider.  2018 [employer]vier/[employer] Standard (2008-06-08 13:02:26)

## 2017-06-11 NOTE — Telephone Encounter (Signed)
If G6PD normal/ negative, plan is to start Sulfasalazine

## 2017-06-11 NOTE — Progress Notes (Signed)
Office Visit Note  Patient: Samantha Valentine             Date of Birth: Jan 15, 1946           MRN: [id number]             PCP: Abner Greenspan, MD Referring: Tower, Wynelle Fanny, MD Visit Date: 06/11/2017 Occupation: _0 @    Subjective:  Pain and swelling in multiple joints.   History of Present Illness: Leighann Norissa Bartee is a 71 y.o. female  Last seen July 214, 2018. Left breast cancer Left masectomy July 6195 at [facility] Complication of infection Had to have a drain (removed mid august 2018). Has some weakness.  Underwent 3 rounds of chemotheray Last dose June 6th.  3 courses of Adriamycin & cytoxin and will not need any additional rounds since mastectomy  Successfully cleared the cancer and margins are clear. F-U w/ surgeon (dr. Lynnette Caffey)  Now on letrozole since mid august and will continue x 5 years.  Stopped enbrel in march 2018.  Allergic to plq (had rash) mid 1990's MTX was ineffective Wants to try ssz    Activities of Daily Living:  Patient reports morning stiffness for 60 minutes.   Patient Reports nocturnal pain.  Difficulty dressing/grooming: Reports Difficulty climbing stairs: Reports Difficulty getting out of chair: Reports Difficulty using hands for taps, buttons, cutlery, and/or writing: Reports   Review of Systems  Constitutional: Negative for fatigue.  HENT: Negative for mouth sores and mouth dryness.   Eyes: Negative for dryness.  Respiratory: Negative for shortness of breath.   Gastrointestinal: Negative for constipation and diarrhea.  Musculoskeletal: Negative for myalgias and myalgias.  Skin: Negative for sensitivity to sunlight.  Psychiatric/Behavioral: Negative for decreased concentration and sleep disturbance.    PMFS History:  Patient Active Problem List   Diagnosis Date Noted  . Tachycardia 05/25/2017  . Dysuria 05/25/2017  . Estrogen deficiency 03/22/2017  . Malignant neoplasm of upper-outer quadrant of left breast in  female, estrogen receptor negative ([place]) 02/04/2017  . Tendinopathy of right shoulder 01/25/2017  . High risk medication use 09/29/2016  . DJD (degenerative joint disease), cervical 09/29/2016  . History of right hip replacement 09/29/2016  . Eczema 07/13/2014  . Adverse effect of immunosuppressive drug 04/27/2012  . ANXIETY DEPRESSION 10/28/2008  . Spinal stenosis 12/29/2007  . Hypothyroidism 12/28/2007  . Vitamin D deficiency 12/28/2007  . HEARING LOSS 12/28/2007  . Seropositive rheumatoid arthritis of multiple sites ([place]) 12/28/2007  . DDD (degenerative disc disease), lumbar 12/28/2007  . Fibromyalgia 12/28/2007  . Osteoporosis 12/28/2007  . MIGRAINES, HX OF 12/28/2007    Past Medical History:  Diagnosis Date  . Arthritis    RA  . Breast mass 1 year   . Cancer ([place]) 01/29/2017   left breast INVASIVE MAMMARY CARCINOMA, ER/PR positive  . Collagen vascular disease (HCC)    Rhematoid Arthritis  . DDD (degenerative disc disease)    in neck  . Depression   . Fibromyalgia   . GERD (gastroesophageal reflux disease)    NO MEDS  . History of kidney stones    MULTIPLE KIDNEY STONES BIL  . Hypothyroidism   . Migraine   . Osteoporosis   . Rheumatoid arthritis ([place])     Family History  Problem Relation Age of Onset  . Alcohol abuse Father   . Diabetes Father   . Stroke Father   . Hypertension Mother   . Endometrial cancer Mother   . Osteoarthritis Mother   .  Breast cancer Paternal Aunt 66  . Liver disease Sister   . Breast cancer Maternal Aunt   . Rheum arthritis Maternal Grandmother   . Diabetes Paternal Grandmother   . Parkinson's disease Paternal Grandfather    Past Surgical History:  Procedure Laterality Date  . BREAST BIOPSY Left 01/29/2017   US guided biopsy INVASIVE MAMMARY CARCINOMA  . JOINT REPLACEMENT Right 2003   total hip replacement  . LITHOTRIPSY  1997   kidney stone  . MASTECTOMY W/ SENTINEL NODE BIOPSY Left 05/18/2017   Procedure: MASTECTOMY WITH  SENTINEL LYMPH NODE BIOPSY;  Surgeon: Robert Bellow, MD;  Location: [facility] ORS;  Service: General;  Laterality: Left;  . PORTACATH PLACEMENT Right 02/15/2017   Procedure: INSERTION PORT-A-CATH;  Surgeon: Robert Bellow, MD;  Location: [facility] ORS;  Service: General;  Laterality: Right;  . TONSILLECTOMY  1970   Social History   Social History Narrative  . No narrative on file     Objective: Vital Signs: BP (!) 142/80   Pulse 88   Resp 14   Ht 5' 4" (1.626 m)   Wt 172 lb (78 kg)   BMI 29.52 kg/m    Physical Exam  Constitutional: She is oriented to person, place, and time. She appears well-developed and well-nourished.  HENT:  Head: Normocephalic and atraumatic.  Eyes: Pupils are equal, round, and reactive to light. EOM are normal.  Cardiovascular: Normal rate, regular rhythm and normal heart sounds.  Exam reveals no gallop and no friction rub.   No murmur heard. Pulmonary/Chest: Effort normal and breath sounds normal. She has no wheezes. She has no rales.  Abdominal: Soft. Bowel sounds are normal. She exhibits no distension. There is no tenderness. There is no guarding. No hernia.  Musculoskeletal: Normal range of motion. She exhibits no edema, tenderness or deformity.  Lymphadenopathy:    She has no cervical adenopathy.  Neurological: She is alert and oriented to person, place, and time. Coordination normal.  Skin: Skin is warm and dry. Capillary refill takes less than 2 seconds. No rash noted.  Psychiatric: She has a normal mood and affect. Her behavior is normal.  Nursing note and vitals reviewed.    Musculoskeletal Exam:  Full range of motion of all joints Grip strength is equal and strong bilaterally Fiber myalgia tender points are absent  CDAI Exam: CDAI Homunculus Exam:   Tenderness:  RUE: wrist LUE: wrist Right hand: 1st MCP, 2nd MCP and 3rd PIP Left hand: 1st MCP  Swelling:  RUE: wrist LUE: wrist Right hand: 1st MCP, 2nd MCP and 3rd PIP Left hand: 1st  MCP  Joint Counts:  CDAI Tender Joint count: 6 CDAI Swollen Joint count: 6  Global Assessments:  Patient Global Assessment: 8 Provider Global Assessment: 8  CDAI Calculated Score: 28    Investigation: No additional findings. Office Visit on 05/31/2017  Component Date Value Ref Range Status  . Anaerobic Culture 05/31/2017 Final report   Final  . Result 1 05/31/2017 Comment   Final   No anaerobic growth in 72 hours.  . Aerobic Culture 05/31/2017 Final report*  Final  . Result 1 05/31/2017 Staphylococcus aureus*  Final   Comment: Heavy growth Most isolates of Staphylococcus sp. produce a beta-lactamase enzyme rendering them resistant to penicillin. Please contact the laboratory if penicillin is being considered for therapy. Based on susceptibility to oxacillin this isolate would be susceptible to: * Beta-lactam/beta-lactamase inhibitor combinations; such as:     Amoxicillin-clavulanic acid     Ampicillin-sulbactam *  Antistaphylococcal cephems; such as:     Cefaclor     Cefuroxime * Antistaphylococcal carbapenems; such as:     Imipenem     Meropenem   . Antimicrobial Susceptibility 05/31/2017 Comment   Final   Comment:       ** S = Susceptible; I = Intermediate; R = Resistant **                    P = Positive; N = Negative             MICS are expressed in micrograms per mL    Antibiotic                 RSLT#1    RSLT#2    RSLT#3    RSLT#4 Ciprofloxacin                  S Clindamycin                    S Erythromycin                   S Gentamicin                     S Levofloxacin                   S Linezolid                      S Moxifloxacin                   S Oxacillin                      S Quinupristin/Dalfopristin      S Rifampin                       S Tetracycline                   S Trimethoprim/Sulfa             S Vancomycin                     S   Office Visit on 05/25/2017  Component Date Value Ref Range Status  . WBC 05/25/2017 6.8  4.0 - 10.5  K/uL Final  . RBC 05/25/2017 4.11  3.87 - 5.11 Mil/uL Final  . Hemoglobin 05/25/2017 13.2  12.0 - 15.0 g/dL Final  . HCT 05/25/2017 40.2  36.0 - 46.0 % Final  . MCV 05/25/2017 98.0  78.0 - 100.0 fl Final  . MCHC 05/25/2017 32.8  30.0 - 36.0 g/dL Final  . RDW 05/25/2017 15.1  11.5 - 15.5 % Final  . Platelets 05/25/2017 241.0  150.0 - 400.0 K/uL Final  . Neutrophils Relative % 05/25/2017 68.4  43.0 - 77.0 % Final  . Lymphocytes Relative 05/25/2017 16.9  12.0 - 46.0 % Final  . Monocytes Relative 05/25/2017 9.9  3.0 - 12.0 % Final  . Eosinophils Relative 05/25/2017 4.2  0.0 - 5.0 % Final  . Basophils Relative 05/25/2017 0.6  0.0 - 3.0 % Final  . Neutro Abs 05/25/2017 4.6  1.4 - 7.7 K/uL Final  . Lymphs Abs 05/25/2017 1.1  0.7 - 4.0 K/uL Final  . Monocytes Absolute 05/25/2017 0.7  0.1 - 1.0 K/uL Final  . Eosinophils Absolute 05/25/2017 0.3  0.0 - 0.7  K/uL Final  . Basophils Absolute 05/25/2017 0.0  0.0 - 0.1 K/uL Final  . Sodium 05/25/2017 140  135 - 145 mEq/L Final  . Potassium 05/25/2017 4.0  3.5 - 5.1 mEq/L Final  . Chloride 05/25/2017 105  96 - 112 mEq/L Final  . CO2 05/25/2017 29  19 - 32 mEq/L Final  . Glucose, Bld 05/25/2017 97  70 - 99 mg/dL Final  . BUN 05/25/2017 17  6 - 23 mg/dL Final  . Creatinine, Ser 05/25/2017 0.76  0.40 - 1.20 mg/dL Final  . Total Bilirubin 05/25/2017 0.3  0.2 - 1.2 mg/dL Final  . Alkaline Phosphatase 05/25/2017 92  39 - 117 U/L Final  . AST 05/25/2017 14  0 - 37 U/L Final  . ALT 05/25/2017 11  0 - 35 U/L Final  . Total Protein 05/25/2017 6.9  6.0 - 8.3 g/dL Final  . Albumin 05/25/2017 3.7  3.5 - 5.2 g/dL Final  . Calcium 05/25/2017 9.2  8.4 - 10.5 mg/dL Final  . GFR 05/25/2017 80.07  >60.00 mL/min Final  . Cholesterol 05/25/2017 170  0 - 200 mg/dL Final   ATP III Classification       Desirable:  < 200 mg/dL               Borderline High:  200 - 239 mg/dL          High:  > = 240 mg/dL  . Triglycerides 05/25/2017 86.0  0.0 - 149.0 mg/dL Final   Normal:   <150 mg/dLBorderline High:  150 - 199 mg/dL  . HDL 05/25/2017 56.90  >39.00 mg/dL Final  . VLDL 05/25/2017 17.2  0.0 - 40.0 mg/dL Final  . LDL Cholesterol 05/25/2017 96  0 - 99 mg/dL Final  . Total CHOL/HDL Ratio 05/25/2017 3   Final                  Men          Women1/2 Average Risk     3.4          3.3Average Risk          5.0          4.42X Average Risk          9.6          7.13X Average Risk          15.0          11.0                      . NonHDL 05/25/2017 113.57   Final   NOTE:  Non-HDL goal should be 30 mg/dL higher than patient's LDL goal (i.e. LDL goal of < 70 mg/dL, would have non-HDL goal of < 100 mg/dL)  . TSH 05/25/2017 0.18* 0.35 - 4.50 uIU/mL Final  . Color, UA 05/25/2017 Yellow   Final  . Clarity, UA 05/25/2017 Clear   Final  . Glucose, UA 05/25/2017 Negative   Final  . Bilirubin, UA 05/25/2017 Negative   Final  . Ketones, UA 05/25/2017 Negative   Final  . Spec Grav, UA 05/25/2017 >=1.030* 1.010 - 1.025 Final  . Blood, UA 05/25/2017 Negative   Final  . pH, UA 05/25/2017 5.5  5.0 - 8.0 Final  . Protein, UA 05/25/2017 Negative   Final  . Urobilinogen, UA 05/25/2017 0.2  0.2 or 1.0 E.U./dL Final  . Nitrite, UA 05/25/2017 Negative   Final  . Leukocytes, UA 05/25/2017  Negative  Negative Final  Admission on 05/18/2017, Discharged on 05/18/2017  Component Date Value Ref Range Status  . SURGICAL PATHOLOGY 05/18/2017    Final                   Value:Surgical Pathology CASE: [id number] PATIENT: Luvenia Starch Surgical Pathology Report     SPECIMEN SUBMITTED: A. Sentinel node 1, 2, 3 B. Breast, left  CLINICAL HISTORY: None provided  PRE-OPERATIVE DIAGNOSIS: Left breast cancer  POST-OPERATIVE DIAGNOSIS: Same as pre-op     DIAGNOSIS: A. SENTINEL LYMPH NODE 1, 2, AND 3; EXCISION: - THREE LYMPH NODES NEGATIVE FOR MALIGNANCY (0/3).  B. BREAST, LEFT; SIMPLE MASTECTOMY: - INVASIVE MAMMARY CARCINOMA. - ONE BENIGN INTRAMAMMARY LYMPH NODE (0/1). - SEE CANCER  SUMMARY BELOW. - UNREMARKABLE SKIN AND NIPPLE.  Surgical Pathology Cancer Case Summary  INVASIVE CARCINOMA OF THE BREAST: Procedure: Simple mastectomy Specimen Laterality: Left Histologic Type: Invasive mammary carcinoma, no special type Histologic Grade ([place] Histologic Score)           Glandular (Acinar)/Tubular Differentiation: 3           Nuclear Pleomorphism: 2           Mitotic Rate: 2           Overall Grade: 2 Tumor Size: 21 mm D                         uctal Carcinoma In Situ (DCIS): Negative for DCIS Margins:      Invasive Carcinoma: Negative for invasive carcinoma           Distance from closest margin: 5 mm from deep margin Regional Lymph nodes:      Total # lymph nodes examined: 4      # Sentinel lymph nodes examined: 3      # Lymph nodes with macrometastasis (>2.0 mm): 0      # Lymph nodes with isolated tumor cells (<0.2 mm): 0      # Lymph nodes with micrometastasis (> 0.2 mm and < 2.0 mm): 0      Extranodal extension: Not applicable Treatment Effect:           In the breast: Probable or definite response to presurgical therapy in the invasive carcinoma           In the lymph nodes: No lymph nodes metastasis. Fibrous scarring, possibly related to prior lymph node metastasis with pathologic complete response Residual Cancer Burden (RCB):      Primary Tumor Bed Primary tumor bed: 21 mm x 15 mm      Overall cancer cellularity: 60 %      Percentage of cancer that is in situ: 0 %       Lymph nodes      # of ly                         mph nodes positive for metastasis: 0      Diameter of largest metastasis: 0 mm      Residual Cancer Burden ([email or web address] <[email or web address]>): 2.093           Residual Cancer Burden Class: RCB-ll Lymphovascular Invasion: Not identified Pathologic Stage Classification (pTNM, AJCC 8th Edition): ypT2 ypN0 (sn) TNM Descriptors: y post treatment  BREAST BIOMARKER TESTS - performed  on prior biopsy Estrogen Receptor (ER) Status: POSITIVE, >90% nuclear staining Progesterone Receptor (PgR) Status: POSITIVE, >90% nuclear staining HER2 (by  immunohistochemistry): Equivocal (Score 2+) HER2 (ERBB2) (by in situ hybridization): Negative   GROSS DESCRIPTION:  A. Intraoperative Consultation:     Received: fresh     Specimen: Sentinel node #1 #2 and #3     Pathologic Evaluation: frozen section     Diagnosis: FS A1, A2, A3: 3 lymph nodes; negative for macro metastasis     Communicated to: Dr. Bary Castilla at 11:43 AM on 05/18/2017, Dellia Nims                         binas M.D.     Tissue submitted: A1, A2 and A3  A. Labeled: sentinel node #1 and #2 #3  Tissue fragment(s): 3  Size: aggregate, 3.5 x 2.5 x 0.4 cm  Description: yellow lobulated fibrofatty tissue with 3 lymph nodes candidates the first pink-tan, 0.9 x 0.6 x 0.3 cm, the second 0.8 x 0.5 x 0.3 cm and the third 2.0 x 0.5 x 0.5 cm, each bisected and frozen, following frozen section is formalin fixed  Block summary 1-bisected first frozen section remnant 2-bisected second frozen section remnant 3-bisected third frozen section remnant  B. Labeled: left breast  Time in fixative: 11:55 AM  Cold ischemic time: 10 minutes  Total fixation time: 8.5 hours  Type of mastectomy: simple  Laterality: left  Weight of specimen: 690 grams  Size of specimen: 18.5 x 20.5 x 3.7 cm  Orientation of specimen: M and L marked on skin as presumed medial and lateral Lateral-orange Deep-black Remaining margin-blue  Skin ellipse dimensions  description: 15 x 8.4 cm  Nipple/                          areola: nipple 1.0 cm in diameter; areola 2.2 x 2.9 cm  Axillary tail: no appreciable axillary tail  Biopsy site(s): present  Number of discrete masses: 1  Location of mass(es): approximately 2:00  Distance between masses: not applicable  Size of mass(es)/biopsy site(s): 2.1 x 1.5 x 1.5 cm  Description of  mass(es)/biopsy site(s): firm ill-defined tan surrounding fibrosis  Margins: 1.0 cm from deep  Gross involvement of skin/fascia/muscle by tumor: no identified  Description of remaining breast: yellow lobulated fibrofatty with central blue surgical dye  Lymph nodes: 1 intraparenchymal 0.6 x 0.4 x 0.3 cm  Block Summary: 1-9-entire mass/tumor bed from lateral towards medial respectively (1-2, 3-4, 5-7, 8-9-is 1 full-thickness section divided) 10-intraparenchymal lymph node bisected 11-representative upper outer quadrant 12-representative lower outer quadrant 13-representative upper inner quadrant 14-representative lower inner quadrant 15-representative                          nipple and areola Final Diagnosis performed by Quay Burow, MD.  Electronically signed 05/19/2017 2:09:55PM    The electronic signature indicates that the named Attending Pathologist has evaluated the specimen  Technical component performed at [place], [address] [place], [place] [address] Lab: [phone] Dir: Darrick Penna. Evette Doffing, MD  Professional component performed at [facility], [facility], [place], [place], [place] [address] Lab: [phone] Dir: Dellia Nims. Reuel Derby, MD    Appointment on 04/14/2017  Component Date Value Ref Range Status  . WBC 04/14/2017 4.6  3.6 - 11.0 K/uL Final  . RBC 04/14/2017 3.54* 3.80 - 5.20 MIL/uL Final  . Hemoglobin 04/14/2017 11.1* 12.0 - 16.0 g/dL Final  . HCT 04/14/2017 32.3* 35.0 - 47.0 % Final  . MCV 04/14/2017 91.2  80.0 - 100.0 fL Final  . MCH 04/14/2017 31.3  26.0 - 34.0 pg Final  . MCHC 04/14/2017 34.3  32.0 - 36.0 g/dL Final  . RDW 04/14/2017 19.7* 11.5 - 14.5 % Final  . Platelets 04/14/2017 323  150 - 440 K/uL Final  . Neutrophils Relative % 04/14/2017 63  % Final  . Neutro Abs 04/14/2017 2.9  1.4 - 6.5 K/uL Final  . Lymphocytes Relative 04/14/2017 18  % Final  . Lymphs Abs 04/14/2017 0.8* 1.0 - 3.6 K/uL Final  . Monocytes  Relative 04/14/2017 17  % Final  . Monocytes Absolute 04/14/2017 0.8  0.2 - 0.9 K/uL Final  . Eosinophils Relative 04/14/2017 1  % Final  . Eosinophils Absolute 04/14/2017 0.0  0 - 0.7 K/uL Final  . Basophils Relative 04/14/2017 1  % Final  . Basophils Absolute 04/14/2017 0.1  0 - 0.1 K/uL Final  . Sodium 04/14/2017 139  135 - 145 mmol/L Final  . Potassium 04/14/2017 3.5  3.5 - 5.1 mmol/L Final  . Chloride 04/14/2017 108  101 - 111 mmol/L Final  . CO2 04/14/2017 25  22 - 32 mmol/L Final  . Glucose, Bld 04/14/2017 97  65 - 99 mg/dL Final  . BUN 04/14/2017 13  6 - 20 mg/dL Final  . Creatinine, Ser 04/14/2017 0.47  0.44 - 1.00 mg/dL Final  . Calcium 04/14/2017 8.6* 8.9 - 10.3 mg/dL Final  . Total Protein 04/14/2017 6.3* 6.5 - 8.1 g/dL Final  . Albumin 04/14/2017 3.3* 3.5 - 5.0 g/dL Final  . AST 04/14/2017 26  15 - 41 U/L Final  . ALT 04/14/2017 15  14 - 54 U/L Final  . Alkaline Phosphatase 04/14/2017 77  38 - 126 U/L Final  . Total Bilirubin 04/14/2017 0.4  0.3 - 1.2 mg/dL Final  . GFR calc non Af Amer 04/14/2017 >60  >60 mL/min Final  . GFR calc Af Amer 04/14/2017 >60  >60 mL/min Final   Comment: (NOTE) The eGFR has been calculated using the CKD EPI equation. This calculation has not been validated in all clinical situations. eGFR's persistently <60 mL/min signify possible Chronic Kidney Disease.   . Anion gap 04/14/2017 6  5 - 15 Final  Appointment on 03/24/2017  Component Date Value Ref Range Status  . WBC 03/24/2017 10.0  3.6 - 11.0 K/uL Final  . RBC 03/24/2017 3.91  3.80 - 5.20 MIL/uL Final  . Hemoglobin 03/24/2017 11.9* 12.0 - 16.0 g/dL Final  . HCT 03/24/2017 34.7* 35.0 - 47.0 % Final  . MCV 03/24/2017 88.7  80.0 - 100.0 fL Final  . MCH 03/24/2017 30.5  26.0 - 34.0 pg Final  . MCHC 03/24/2017 34.4  32.0 - 36.0 g/dL Final  . RDW 03/24/2017 16.5* 11.5 - 14.5 % Final  . Platelets 03/24/2017 391  150 - 440 K/uL Final  . Neutrophils Relative % 03/24/2017 74  % Final  . Neutro  Abs 03/24/2017 7.5* 1.4 - 6.5 K/uL Final  . Lymphocytes Relative 03/24/2017 12  % Final  . Lymphs Abs 03/24/2017 1.2  1.0 - 3.6 K/uL Final  . Monocytes Relative 03/24/2017 12  % Final  . Monocytes Absolute 03/24/2017 1.2* 0.2 - 0.9 K/uL Final  . Eosinophils Relative 03/24/2017 0  % Final  . Eosinophils Absolute 03/24/2017 0.0  0 - 0.7 K/uL Final  . Basophils Relative 03/24/2017 2  % Final  . Basophils Absolute 03/24/2017 0.1  0 - 0.1 K/uL Final  . Sodium 03/24/2017 135  135 -  145 mmol/L Final  . Potassium 03/24/2017 4.1  3.5 - 5.1 mmol/L Final  . Chloride 03/24/2017 104  101 - 111 mmol/L Final  . CO2 03/24/2017 24  22 - 32 mmol/L Final  . Glucose, Bld 03/24/2017 95  65 - 99 mg/dL Final  . BUN 03/24/2017 18  6 - 20 mg/dL Final  . Creatinine, Ser 03/24/2017 0.65  0.44 - 1.00 mg/dL Final  . Calcium 03/24/2017 8.9  8.9 - 10.3 mg/dL Final  . Total Protein 03/24/2017 6.7  6.5 - 8.1 g/dL Final  . Albumin 03/24/2017 3.5  3.5 - 5.0 g/dL Final  . AST 03/24/2017 44* 15 - 41 U/L Final  . ALT 03/24/2017 17  14 - 54 U/L Final  . Alkaline Phosphatase 03/24/2017 75  38 - 126 U/L Final  . Total Bilirubin 03/24/2017 0.4  0.3 - 1.2 mg/dL Final  . GFR calc non Af Amer 03/24/2017 >60  >60 mL/min Final  . GFR calc Af Amer 03/24/2017 >60  >60 mL/min Final   Comment: (NOTE) The eGFR has been calculated using the CKD EPI equation. This calculation has not been validated in all clinical situations. eGFR's persistently <60 mL/min signify possible Chronic Kidney Disease.   . Anion gap 03/24/2017 7  5 - 15 Final  Appointment on 03/17/2017  Component Date Value Ref Range Status  . WBC 03/17/2017 7.9  3.6 - 11.0 K/uL Final  . RBC 03/17/2017 3.84  3.80 - 5.20 MIL/uL Final  . Hemoglobin 03/17/2017 11.4* 12.0 - 16.0 g/dL Final  . HCT 03/17/2017 33.5* 35.0 - 47.0 % Final  . MCV 03/17/2017 87.1  80.0 - 100.0 fL Final  . MCH 03/17/2017 29.7  26.0 - 34.0 pg Final  . MCHC 03/17/2017 34.1  32.0 - 36.0 g/dL Final  .  RDW 03/17/2017 14.2  11.5 - 14.5 % Final  . Platelets 03/17/2017 315  150 - 440 K/uL Final  . Neutrophils Relative % 03/17/2017 79  % Final  . Neutro Abs 03/17/2017 6.2  1.4 - 6.5 K/uL Final  . Lymphocytes Relative 03/17/2017 12  % Final  . Lymphs Abs 03/17/2017 0.9* 1.0 - 3.6 K/uL Final  . Monocytes Relative 03/17/2017 9  % Final  . Monocytes Absolute 03/17/2017 0.7  0.2 - 0.9 K/uL Final  . Eosinophils Relative 03/17/2017 0  % Final  . Eosinophils Absolute 03/17/2017 0.0  0 - 0.7 K/uL Final  . Basophils Relative 03/17/2017 0  % Final  . Basophils Absolute 03/17/2017 0.0  0 - 0.1 K/uL Final  . Sodium 03/17/2017 138  135 - 145 mmol/L Final  . Potassium 03/17/2017 3.4* 3.5 - 5.1 mmol/L Final  . Chloride 03/17/2017 108  101 - 111 mmol/L Final  . CO2 03/17/2017 22  22 - 32 mmol/L Final  . Glucose, Bld 03/17/2017 139* 65 - 99 mg/dL Final  . BUN 03/17/2017 12  6 - 20 mg/dL Final  . Creatinine, Ser 03/17/2017 0.60  0.44 - 1.00 mg/dL Final  . Calcium 03/17/2017 8.5* 8.9 - 10.3 mg/dL Final  . Total Protein 03/17/2017 6.2* 6.5 - 8.1 g/dL Final  . Albumin 03/17/2017 3.2* 3.5 - 5.0 g/dL Final  . AST 03/17/2017 30  15 - 41 U/L Final  . ALT 03/17/2017 19  14 - 54 U/L Final  . Alkaline Phosphatase 03/17/2017 83  38 - 126 U/L Final  . Total Bilirubin 03/17/2017 0.3  0.3 - 1.2 mg/dL Final  . GFR calc non Af Amer 03/17/2017 >60  >60 mL/min  Final  . GFR calc Af Amer 03/17/2017 >60  >60 mL/min Final   Comment: (NOTE) The eGFR has been calculated using the CKD EPI equation. This calculation has not been validated in all clinical situations. eGFR's persistently <60 mL/min signify possible Chronic Kidney Disease.   . Anion gap 03/17/2017 8  5 - 15 Final  Admission on 03/10/2017, Discharged on 03/13/2017  Component Date Value Ref Range Status  . Sodium 03/10/2017 134* 135 - 145 mmol/L Final  . Potassium 03/10/2017 3.9  3.5 - 5.1 mmol/L Final  . Chloride 03/10/2017 102  101 - 111 mmol/L Final  . CO2  03/10/2017 24  22 - 32 mmol/L Final  . Glucose, Bld 03/10/2017 128* 65 - 99 mg/dL Final  . BUN 03/10/2017 18  6 - 20 mg/dL Final  . Creatinine, Ser 03/10/2017 0.67  0.44 - 1.00 mg/dL Final  . Calcium 03/10/2017 8.7* 8.9 - 10.3 mg/dL Final  . Total Protein 03/10/2017 6.3* 6.5 - 8.1 g/dL Final  . Albumin 03/10/2017 3.1* 3.5 - 5.0 g/dL Final  . AST 03/10/2017 22  15 - 41 U/L Final  . ALT 03/10/2017 20  14 - 54 U/L Final  . Alkaline Phosphatase 03/10/2017 88  38 - 126 U/L Final  . Total Bilirubin 03/10/2017 0.6  0.3 - 1.2 mg/dL Final  . GFR calc non Af Amer 03/10/2017 >60  >60 mL/min Final  . GFR calc Af Amer 03/10/2017 >60  >60 mL/min Final   Comment: (NOTE) The eGFR has been calculated using the CKD EPI equation. This calculation has not been validated in all clinical situations. eGFR's persistently <60 mL/min signify possible Chronic Kidney Disease.   . Anion gap 03/10/2017 8  5 - 15 Final  . WBC 03/10/2017 0.4* 3.6 - 11.0 K/uL Final   Comment: CRITICAL RESULT CALLED TO, READ BACK BY AND VERIFIED WITH: KIM MAIN AT 1603 ON 03/10/2017 JJB   . RBC 03/10/2017 3.95  3.80 - 5.20 MIL/uL Final  . Hemoglobin 03/10/2017 11.7* 12.0 - 16.0 g/dL Final  . HCT 03/10/2017 34.4* 35.0 - 47.0 % Final  . MCV 03/10/2017 87.0  80.0 - 100.0 fL Final  . MCH 03/10/2017 29.7  26.0 - 34.0 pg Final  . MCHC 03/10/2017 34.1  32.0 - 36.0 g/dL Final  . RDW 03/10/2017 13.9  11.5 - 14.5 % Final  . Platelets 03/10/2017 74* 150 - 440 K/uL Final  . Neutrophils Relative % 03/10/2017 2  % Final  . Lymphocytes Relative 03/10/2017 81  % Final  . Monocytes Relative 03/10/2017 15  % Final  . Eosinophils Relative 03/10/2017 2  % Final  . Basophils Relative 03/10/2017 0  % Final  . Neutro Abs 03/10/2017 0.0* 1.4 - 6.5 K/uL Final  . Lymphs Abs 03/10/2017 0.3* 1.0 - 3.6 K/uL Final  . Monocytes Absolute 03/10/2017 0.1* 0.2 - 0.9 K/uL Final  . Eosinophils Absolute 03/10/2017 0.0  0 - 0.7 K/uL Final  . Basophils Absolute  03/10/2017 0.0  0 - 0.1 K/uL Final  . Specimen Description 03/10/2017 BLOOD PORT   Final  . Special Requests 03/10/2017 Blood Culture adequate volume   Final  . Culture 03/10/2017 NO GROWTH 5 DAYS   Final  . Report Status 03/10/2017 03/15/2017 FINAL   Final  . Specimen Description 03/10/2017 BLOOD RIGHT AC   Final  . Special Requests 03/10/2017 Blood Culture results may not be optimal due to an excessive volume of blood received in culture bottles   Final  . Culture  03/10/2017 NO GROWTH 5 DAYS   Final  . Report Status 03/10/2017 03/15/2017 FINAL   Final  . Color, Urine 03/10/2017 YELLOW* YELLOW Final  . APPearance 03/10/2017 HAZY* CLEAR Final  . Specific Gravity, Urine 03/10/2017 1.020  1.005 - 1.030 Final  . pH 03/10/2017 5.0  5.0 - 8.0 Final  . Glucose, UA 03/10/2017 NEGATIVE  NEGATIVE mg/dL Final  . Hgb urine dipstick 03/10/2017 LARGE* NEGATIVE Final  . Bilirubin Urine 03/10/2017 NEGATIVE  NEGATIVE Final  . Ketones, ur 03/10/2017 5* NEGATIVE mg/dL Final  . Protein, ur 03/10/2017 100* NEGATIVE mg/dL Final  . Nitrite 03/10/2017 NEGATIVE  NEGATIVE Final  . Leukocytes, UA 03/10/2017 NEGATIVE  NEGATIVE Final  . RBC / HPF 03/10/2017 TOO NUMEROUS TO COUNT  0 - 5 RBC/hpf Final  . WBC, UA 03/10/2017 0-5  0 - 5 WBC/hpf Final  . Bacteria, UA 03/10/2017 NONE SEEN  NONE SEEN Final  . Squamous Epithelial / LPF 03/10/2017 6-30* NONE SEEN Final  . Mucus 03/10/2017 PRESENT   Final  . Lactic Acid, Venous 03/10/2017 1.8  0.5 - 1.9 mmol/L Final  . Troponin I 03/10/2017 0.05* <0.03 ng/mL Final   Comment: CRITICAL RESULT CALLED TO, READ BACK BY AND VERIFIED WITH KIM MAIN AT 1610 ON 03/10/17 BY SNJ   . Sodium 03/11/2017 136  135 - 145 mmol/L Final  . Potassium 03/11/2017 3.7  3.5 - 5.1 mmol/L Final  . Chloride 03/11/2017 107  101 - 111 mmol/L Final  . CO2 03/11/2017 24  22 - 32 mmol/L Final  . Glucose, Bld 03/11/2017 103* 65 - 99 mg/dL Final  . BUN 03/11/2017 10  6 - 20 mg/dL Final  . Creatinine, Ser  03/11/2017 0.49  0.44 - 1.00 mg/dL Final  . Calcium 03/11/2017 7.9* 8.9 - 10.3 mg/dL Final  . GFR calc non Af Amer 03/11/2017 >60  >60 mL/min Final  . GFR calc Af Amer 03/11/2017 >60  >60 mL/min Final   Comment: (NOTE) The eGFR has been calculated using the CKD EPI equation. This calculation has not been validated in all clinical situations. eGFR's persistently <60 mL/min signify possible Chronic Kidney Disease.   . Anion gap 03/11/2017 5  5 - 15 Final  . WBC 03/11/2017 0.6* 3.6 - 11.0 K/uL Final   Comment: RESULT REPEATED AND VERIFIED CRITICAL VALUE NOTED.  VALUE IS CONSISTENT WITH PREVIOUSLY REPORTED AND CALLED VALUE.   Marland Kitchen RBC 03/11/2017 3.59* 3.80 - 5.20 MIL/uL Final  . Hemoglobin 03/11/2017 10.3* 12.0 - 16.0 g/dL Final  . HCT 03/11/2017 31.0* 35.0 - 47.0 % Final  . MCV 03/11/2017 86.3  80.0 - 100.0 fL Final  . MCH 03/11/2017 28.7  26.0 - 34.0 pg Final  . MCHC 03/11/2017 33.2  32.0 - 36.0 g/dL Final  . RDW 03/11/2017 13.6  11.5 - 14.5 % Final  . Platelets 03/11/2017 61* 150 - 440 K/uL Final  . WBC 03/12/2017 2.8* 3.6 - 11.0 K/uL Final  . RBC 03/12/2017 3.30* 3.80 - 5.20 MIL/uL Final  . Hemoglobin 03/12/2017 9.5* 12.0 - 16.0 g/dL Final  . HCT 03/12/2017 28.5* 35.0 - 47.0 % Final  . MCV 03/12/2017 86.4  80.0 - 100.0 fL Final  . MCH 03/12/2017 28.8  26.0 - 34.0 pg Final  . MCHC 03/12/2017 33.3  32.0 - 36.0 g/dL Final  . RDW 03/12/2017 13.5  11.5 - 14.5 % Final  . Platelets 03/12/2017 76* 150 - 440 K/uL Final  . Neutrophils Relative % 03/12/2017 71  % Final  .  Lymphocytes Relative 03/12/2017 18  % Final  . Monocytes Relative 03/12/2017 8  % Final  . Eosinophils Relative 03/12/2017 1  % Final  . Basophils Relative 03/12/2017 2  % Final  . Neutro Abs 03/12/2017 2.0  1.4 - 6.5 K/uL Final  . Lymphs Abs 03/12/2017 0.5* 1.0 - 3.6 K/uL Final  . Monocytes Absolute 03/12/2017 0.2  0.2 - 0.9 K/uL Final  . Eosinophils Absolute 03/12/2017 0.0  0 - 0.7 K/uL Final  . Basophils Absolute  03/12/2017 0.1  0 - 0.1 K/uL Final  . Smear Review 03/12/2017 MORPHOLOGY UNREMARKABLE   Final  . WBC 03/13/2017 5.6  3.6 - 11.0 K/uL Final  . RBC 03/13/2017 3.64* 3.80 - 5.20 MIL/uL Final  . Hemoglobin 03/13/2017 10.8* 12.0 - 16.0 g/dL Final  . HCT 03/13/2017 31.7* 35.0 - 47.0 % Final  . MCV 03/13/2017 87.3  80.0 - 100.0 fL Final  . MCH 03/13/2017 29.7  26.0 - 34.0 pg Final  . MCHC 03/13/2017 34.0  32.0 - 36.0 g/dL Final  . RDW 03/13/2017 13.9  11.5 - 14.5 % Final  . Platelets 03/13/2017 122* 150 - 440 K/uL Final  . Neutrophils Relative % 03/13/2017 70  % Final  . Lymphocytes Relative 03/13/2017 16  % Final  . Monocytes Relative 03/13/2017 11  % Final  . Eosinophils Relative 03/13/2017 2  % Final  . Basophils Relative 03/13/2017 0  % Final  . Band Neutrophils 03/13/2017 1  % Final  . Metamyelocytes Relative 03/13/2017 0  % Final  . Myelocytes 03/13/2017 0  % Final  . Promyelocytes Absolute 03/13/2017 0  % Final  . Blasts 03/13/2017 0  % Final  . nRBC 03/13/2017 0  0 /100 WBC Final  . Other 03/13/2017 0  % Final  . Neutro Abs 03/13/2017 4.0  1.4 - 6.5 K/uL Final  . Lymphs Abs 03/13/2017 0.9* 1.0 - 3.6 K/uL Final  . Monocytes Absolute 03/13/2017 0.6  0.2 - 0.9 K/uL Final  . Eosinophils Absolute 03/13/2017 0.1  0 - 0.7 K/uL Final  . Basophils Absolute 03/13/2017 0.0  0 - 0.1 K/uL Final  . RBC Morphology 03/13/2017 MIXED RBC POPULATION   Final  . WBC Morphology 03/13/2017 TOXIC GRANULATION   Final  Appointment on 03/03/2017  Component Date Value Ref Range Status  . WBC 03/03/2017 9.6  3.6 - 11.0 K/uL Final  . RBC 03/03/2017 4.28  3.80 - 5.20 MIL/uL Final  . Hemoglobin 03/03/2017 12.6  12.0 - 16.0 g/dL Final  . HCT 03/03/2017 37.0  35.0 - 47.0 % Final  . MCV 03/03/2017 86.6  80.0 - 100.0 fL Final  . MCH 03/03/2017 29.5  26.0 - 34.0 pg Final  . MCHC 03/03/2017 34.1  32.0 - 36.0 g/dL Final  . RDW 03/03/2017 13.4  11.5 - 14.5 % Final  . Platelets 03/03/2017 378  150 - 440 K/uL Final    . Neutrophils Relative % 03/03/2017 74  % Final  . Neutro Abs 03/03/2017 7.2* 1.4 - 6.5 K/uL Final  . Lymphocytes Relative 03/03/2017 16  % Final  . Lymphs Abs 03/03/2017 1.5  1.0 - 3.6 K/uL Final  . Monocytes Relative 03/03/2017 9  % Final  . Monocytes Absolute 03/03/2017 0.8  0.2 - 0.9 K/uL Final  . Eosinophils Relative 03/03/2017 0  % Final  . Eosinophils Absolute 03/03/2017 0.0  0 - 0.7 K/uL Final  . Basophils Relative 03/03/2017 1  % Final  . Basophils Absolute 03/03/2017 0.1  0 -  0.1 K/uL Final  . Sodium 03/03/2017 136  135 - 145 mmol/L Final  . Potassium 03/03/2017 4.1  3.5 - 5.1 mmol/L Final  . Chloride 03/03/2017 105  101 - 111 mmol/L Final  . CO2 03/03/2017 24  22 - 32 mmol/L Final  . Glucose, Bld 03/03/2017 98  65 - 99 mg/dL Final  . BUN 03/03/2017 13  6 - 20 mg/dL Final  . Creatinine, Ser 03/03/2017 0.72  0.44 - 1.00 mg/dL Final  . Calcium 03/03/2017 8.7* 8.9 - 10.3 mg/dL Final  . Total Protein 03/03/2017 6.6  6.5 - 8.1 g/dL Final  . Albumin 03/03/2017 3.2* 3.5 - 5.0 g/dL Final  . AST 03/03/2017 35  15 - 41 U/L Final  . ALT 03/03/2017 17  14 - 54 U/L Final  . Alkaline Phosphatase 03/03/2017 87  38 - 126 U/L Final  . Total Bilirubin 03/03/2017 0.3  0.3 - 1.2 mg/dL Final  . GFR calc non Af Amer 03/03/2017 >60  >60 mL/min Final  . GFR calc Af Amer 03/03/2017 >60  >60 mL/min Final   Comment: (NOTE) The eGFR has been calculated using the CKD EPI equation. This calculation has not been validated in all clinical situations. eGFR's persistently <60 mL/min signify possible Chronic Kidney Disease.   . Anion gap 03/03/2017 7  5 - 15 Final  Appointment on 02/24/2017  Component Date Value Ref Range Status  . WBC 02/24/2017 0.5* 3.6 - 11.0 K/uL Final   Comment: RESULT REPEATED AND VERIFIED [facility] CRITICAL VALUE PROTOCOL   . RBC 02/24/2017 4.10  3.80 - 5.20 MIL/uL Final  . Hemoglobin 02/24/2017 12.1  12.0 - 16.0 g/dL Final  . HCT 02/24/2017 36.0  35.0 - 47.0 % Final  .  MCV 02/24/2017 87.8  80.0 - 100.0 fL Final  . MCH 02/24/2017 29.6  26.0 - 34.0 pg Final  . MCHC 02/24/2017 33.7  32.0 - 36.0 g/dL Final  . RDW 02/24/2017 13.5  11.5 - 14.5 % Final  . Platelets 02/24/2017 51* 150 - 440 K/uL Final  . Neutrophils Relative % 02/24/2017 12  % Final  . Neutro Abs 02/24/2017 0.1* 1.4 - 6.5 K/uL Final   Comment: RESULT REPEATED AND VERIFIED CRITICAL RESULT CALLED TO, READ BACK BY AND VERIFIED WITH: BRITTANY PEARCE AT 6195 02/24/2017 KMR   . Lymphocytes Relative 02/24/2017 69  % Final  . Lymphs Abs 02/24/2017 0.4* 1.0 - 3.6 K/uL Final  . Monocytes Relative 02/24/2017 12  % Final  . Monocytes Absolute 02/24/2017 0.1* 0.2 - 0.9 K/uL Final  . Eosinophils Relative 02/24/2017 6  % Final  . Eosinophils Absolute 02/24/2017 0.0  0 - 0.7 K/uL Final  . Basophils Relative 02/24/2017 1  % Final  . Basophils Absolute 02/24/2017 0.0  0 - 0.1 K/uL Final  . Sodium 02/24/2017 130* 135 - 145 mmol/L Final  . Potassium 02/24/2017 4.0  3.5 - 5.1 mmol/L Final  . Chloride 02/24/2017 101  101 - 111 mmol/L Final  . CO2 02/24/2017 22  22 - 32 mmol/L Final  . Glucose, Bld 02/24/2017 160* 65 - 99 mg/dL Final  . BUN 02/24/2017 17  6 - 20 mg/dL Final  . Creatinine, Ser 02/24/2017 0.88  0.44 - 1.00 mg/dL Final  . Calcium 02/24/2017 8.4* 8.9 - 10.3 mg/dL Final  . Total Protein 02/24/2017 6.6  6.5 - 8.1 g/dL Final  . Albumin 02/24/2017 3.2* 3.5 - 5.0 g/dL Final  . AST 02/24/2017 42* 15 - 41 U/L Final  .  ALT 02/24/2017 15  14 - 54 U/L Final  . Alkaline Phosphatase 02/24/2017 85  38 - 126 U/L Final  . Total Bilirubin 02/24/2017 0.6  0.3 - 1.2 mg/dL Final  . GFR calc non Af Amer 02/24/2017 >60  >60 mL/min Final  . GFR calc Af Amer 02/24/2017 >60  >60 mL/min Final   Comment: (NOTE) The eGFR has been calculated using the CKD EPI equation. This calculation has not been validated in all clinical situations. eGFR's persistently <60 mL/min signify possible Chronic Kidney Disease.   . Anion gap  02/24/2017 7  5 - 15 Final  Infusion on 02/16/2017  Component Date Value Ref Range Status  . WBC 02/16/2017 8.7  3.6 - 11.0 K/uL Final  . RBC 02/16/2017 4.63  3.80 - 5.20 MIL/uL Final  . Hemoglobin 02/16/2017 13.9  12.0 - 16.0 g/dL Final  . HCT 02/16/2017 40.7  35.0 - 47.0 % Final  . MCV 02/16/2017 87.8  80.0 - 100.0 fL Final  . MCH 02/16/2017 30.1  26.0 - 34.0 pg Final  . MCHC 02/16/2017 34.3  32.0 - 36.0 g/dL Final  . RDW 02/16/2017 14.0  11.5 - 14.5 % Final  . Platelets 02/16/2017 276  150 - 440 K/uL Final  . Neutrophils Relative % 02/16/2017 69  % Final  . Neutro Abs 02/16/2017 6.0  1.4 - 6.5 K/uL Final  . Lymphocytes Relative 02/16/2017 19  % Final  . Lymphs Abs 02/16/2017 1.6  1.0 - 3.6 K/uL Final  . Monocytes Relative 02/16/2017 8  % Final  . Monocytes Absolute 02/16/2017 0.7  0.2 - 0.9 K/uL Final  . Eosinophils Relative 02/16/2017 3  % Final  . Eosinophils Absolute 02/16/2017 0.3  0 - 0.7 K/uL Final  . Basophils Relative 02/16/2017 1  % Final  . Basophils Absolute 02/16/2017 0.1  0 - 0.1 K/uL Final  . Sodium 02/16/2017 137  135 - 145 mmol/L Final  . Potassium 02/16/2017 3.9  3.5 - 5.1 mmol/L Final  . Chloride 02/16/2017 106  101 - 111 mmol/L Final  . CO2 02/16/2017 25  22 - 32 mmol/L Final  . Glucose, Bld 02/16/2017 116* 65 - 99 mg/dL Final  . BUN 02/16/2017 16  6 - 20 mg/dL Final  . Creatinine, Ser 02/16/2017 0.60  0.44 - 1.00 mg/dL Final  . Calcium 02/16/2017 8.8* 8.9 - 10.3 mg/dL Final  . Total Protein 02/16/2017 6.9  6.5 - 8.1 g/dL Final  . Albumin 02/16/2017 3.3* 3.5 - 5.0 g/dL Final  . AST 02/16/2017 23  15 - 41 U/L Final  . ALT 02/16/2017 15  14 - 54 U/L Final  . Alkaline Phosphatase 02/16/2017 83  38 - 126 U/L Final  . Total Bilirubin 02/16/2017 0.6  0.3 - 1.2 mg/dL Final  . GFR calc non Af Amer 02/16/2017 >60  >60 mL/min Final  . GFR calc Af Amer 02/16/2017 >60  >60 mL/min Final   Comment: (NOTE) The eGFR has been calculated using the CKD EPI equation. This  calculation has not been validated in all clinical situations. eGFR's persistently <60 mL/min signify possible Chronic Kidney Disease.   . Anion gap 02/16/2017 6  5 - 15 Final  There may be more visits with results that are not included.     Imaging: Dg Chest 2 View  Result Date: 05/26/2017 CLINICAL DATA:  Tachycardia. One week postop from left mastectomy for breast carcinoma. EXAM: CHEST  2 VIEW COMPARISON:  03/10/2017 FINDINGS: Right-sided Port-A-Cath remains in appropriate position.  Surgical drain seen in the left chest wall soft tissues. Heart size is normal. Stable ectasia of thoracic aorta. Both lungs are clear. No pneumothorax visualized. IMPRESSION: No active cardiopulmonary disease. Electronically Signed   By: Earle Gell M.D.   On: 05/26/2017 09:20   Nm Sentinel Node Injection  Result Date: 05/18/2017 CLINICAL DATA:  Left breast cancer. EXAM: NUCLEAR MEDICINE BREAST LYMPHOSCINTIGRAPHY TECHNIQUE: Intradermal injection of radiopharmaceutical was performed at the 12 o'clock, 3 o'clock, 6 o'clock, and 9 o'clock positions around the left nipple. The patient was then sent to the operating room where the sentinel node(s) were identified and removed by the surgeon. RADIOPHARMACEUTICALS:  Total of 1 mCi Millipore-filtered Technetium-55msulfur colloid, injected in four aliquots of 0.25 mCi each. IMPRESSION: Uncomplicated intradermal injection of a total of 1 mCi Technetium-974mulfur colloid for purposes of sentinel node identification. Electronically Signed   By: GlAletta Edouard.D.   On: 05/18/2017 10:10   UsKoreareast Complete Uni Left Inc Axilla  Result Date: 05/13/2017 Ultrasound examination of the left breast showed a significant decrease in size of the dominant mass to 1.5 cm in maximum dimension. This is about a 50-60% reduction in volume compared to pre-chemotherapy measurements. Examination of the left axilla shows a single prominent node in the lower aspect of the axilla measuring up to  1.03 cm. Cortical thickening is modest to 0.2 mm, previously 5 mm suggesting this was a node involved with malignancy. BI-RADS-6.   Speciality Comments: No specialty comments available.    Procedures:  No procedures performed Allergies: Adhesive [tape]; Hydroxychloroquine sulfate; Ibandronate sodium; and Risedronate sodium   Assessment / Plan:     Visit Diagnoses: Seropositive rheumatoid arthritis of multiple sites [place] Plan: Glucose 6 phosphate dehydrogenase, CBC with Differential/Platelet, COMPLETE METABOLIC PANEL WITH GFR, CBC with Differential/Platelet, COMPLETE METABOLIC PANEL WITH GFR  High risk medication use - Plan: Glucose 6 phosphate dehydrogenase, CBC with Differential/Platelet, COMPLETE METABOLIC PANEL WITH GFR, CBC with Differential/Platelet, COMPLETE METABOLIC PANEL WITH GFR  History of breast cancer  Fibromyalgia   Plan: #1: Rheumatoid arthritis, seropositive Flaring at the moment. Patient has been off of Enbrel since March 2018. Has left breast cancer and the left breast mastectomy. See epic for full details of surgery and progress. Has had 3 rounds of chemotherapy. Because the surgery was successful, patient does not need any more rounds of chemotherapy at this time.  Patient presents today to start on immunosuppressive therapy. After full discussion, patient would like to start with sulfasalazine. We discussed side effects of sulfasalazine.  g6pd today  Ok to use orencia in future if surgeon says it is ok.  ============================= Reviewing Dr. JeDellis Filberturnette's note from 06/04/2017, his office right's the following plan where it is noted that reinitiation of biologic therapy is fine with his office.====> Plan    Review of Dr. FiGary Fleetotes from 05/26/2017 reported a plan to begin Femara 2.5 mg daily. Prescription for same has been sent to her pharmacy.  We'll plan on rechecking the wound in 4 days she has been asked to complete her course of  Bactrim.  She's been encouraged to contact her rheumatologist to reinitiate biologic therapy for her rheumatoid arthritis the week of August 27.    HPI, Physical Exam, Assessment and Plan have been scribed under the direction and in the presence of JeRobert BellowMD  CaConcepcion LivingLPN  I have completed the exam and reviewed the above documentation for accuracy and completeness.  I agree with the above.  Haematologist has been used and any errors in dictation or transcription are unintentional.  Hervey Ard, M.D., F.A.C.S. =============================  #2: High risk prescription Off of Enbrel due to failure as well as new history of left breast cancer with mastectomy spring/summer of 2018  We'll try sulfasalazine EN 500 mg; 2 pills twice a day Dispense 120 pills with 2 refills  3: Consent on sulfasalazine; patient already received handout on last visit  #4: CBC with differential and CMP with GFR in 4 weeks.  #5: G6PD today.  #6: Prednisone taper Is on 5 mg, 4 pills every morning for 7 days, 3 pills for 7 days, 2 pills for 7 days, 1 pill for 7 days, half pill for 7 days, stop  #7: Return to clinic in 3 months  Orders: Orders Placed This Encounter  Procedures  . Glucose 6 phosphate dehydrogenase  . CBC with Differential/Platelet  . COMPLETE METABOLIC PANEL WITH GFR  . CBC with Differential/Platelet  . COMPLETE METABOLIC PANEL WITH GFR   Meds ordered this encounter  Medications  . predniSONE (DELTASONE) 5 MG tablet    Sig: 4 pills every morning for 7 days, 3 pills for 7 days, 2 pills for 7 days, 1 pill for 7 days, half pill for 7 days then stop.    Dispense:  74 tablet    Refill:  0    Order Specific Question:   Supervising Provider    Answer:   Bo Merino [phone]    Face-to-face time spent with patient was 30 minutes. 50% of time was spent in counseling and coordination of care.  Follow-Up Instructions: Return in about 3 months (around  09/11/2017) for RA,cancer,new start ssz,flare of ra --> pred taper.   Eliezer Lofts, PA-C  I examined and evaluated the patient with Eliezer Lofts PA. Pt had active synovitis in multiple joints today on my exam. She has been off Enbrel due to dx of [place] cancer. She has very argressive RA. We will try starting on SSZ after her labs. We discussed option of combination therapy as well.She will need approval from Oncology in case we have to try [facility]  The plan of care was discussed as noted above.  Bo Merino, MD Note - This record has been created using Editor, commissioning.  Chart creation errors have been sought, but may not always  have been located. Such creation errors do not reflect on  the standard of medical care.

## 2017-06-11 NOTE — Telephone Encounter (Signed)
Sticky note made in patient's chart 

## 2017-06-14 ENCOUNTER — Ambulatory Visit (INDEPENDENT_AMBULATORY_CARE_PROVIDER_SITE_OTHER): Payer: PPO | Admitting: General Surgery

## 2017-06-14 ENCOUNTER — Encounter: Payer: Self-pay | Admitting: General Surgery

## 2017-06-14 VITALS — BP 142/80 | HR 82 | Resp 14 | Ht 64.0 in | Wt 172.0 lb

## 2017-06-14 DIAGNOSIS — C50412 Malignant neoplasm of upper-outer quadrant of left female breast: Secondary | ICD-10-CM

## 2017-06-14 DIAGNOSIS — Z171 Estrogen receptor negative status [ER-]: Secondary | ICD-10-CM

## 2017-06-14 LAB — GLUCOSE 6 PHOSPHATE DEHYDROGENASE: G-6PDH: 17.4 U/g{Hb} (ref 7.0–20.5)

## 2017-06-14 NOTE — Progress Notes (Signed)
Patient ID: Samantha Valentine, female   DOB: 1946-06-24, 71 y.o.   MRN: [id number]  Chief Complaint  Patient presents with  . Routine Post Op    HPI Samantha Valentine is a 71 y.o. female here today for her follow up left mastectomy done on 05/18/2017. Patient states she is doing well. She finished her Septra. She was placed back on Prednisone by her Rheumatologist so she has not been sleeping well.  HPI  Past Medical History:  Diagnosis Date  . Arthritis    RA  . Breast mass 1 year   . Cancer ([place]) 01/29/2017   left breast INVASIVE MAMMARY CARCINOMA, ER/PR positive  . Collagen vascular disease (HCC)    Rhematoid Arthritis  . DDD (degenerative disc disease)    in neck  . Depression   . Fibromyalgia   . GERD (gastroesophageal reflux disease)    NO MEDS  . History of kidney stones    MULTIPLE KIDNEY STONES BIL  . Hypothyroidism   . Migraine   . Osteoporosis   . Rheumatoid arthritis [facility])     Past Surgical History:  Procedure Laterality Date  . BREAST BIOPSY Left 01/29/2017   US guided biopsy INVASIVE MAMMARY CARCINOMA  . JOINT REPLACEMENT Right 2003   total hip replacement  . LITHOTRIPSY  1997   kidney stone  . MASTECTOMY W/ SENTINEL NODE BIOPSY Left 05/18/2017   Procedure: MASTECTOMY WITH SENTINEL LYMPH NODE BIOPSY;  Surgeon: Robert Bellow, MD;  Location: [facility] ORS;  Service: General;  Laterality: Left;  . PORTACATH PLACEMENT Right 02/15/2017   Procedure: INSERTION PORT-A-CATH;  Surgeon: Robert Bellow, MD;  Location: [facility] ORS;  Service: General;  Laterality: Right;  . TONSILLECTOMY  1970    Family History  Problem Relation Age of Onset  . Alcohol abuse Father   . Diabetes Father   . Stroke Father   . Hypertension Mother   . Endometrial cancer Mother   . Osteoarthritis Mother   . Breast cancer Paternal Aunt 54  . Liver disease Sister   . Breast cancer Maternal Aunt   . Rheum arthritis Maternal Grandmother   . Diabetes Paternal Grandmother   .  Parkinson's disease Paternal Grandfather     Social History Social History  Substance Use Topics  . Smoking status: Former Smoker    Packs/day: 1.50    Years: 25.00    Types: Cigarettes    Quit date: 10/20/1983  . Smokeless tobacco: Never Used  . Alcohol use No    Allergies  Allergen Reactions  . Adhesive [Tape] Other (See Comments)    Skin blistered (PAPER TAPE OK)  . Hydroxychloroquine Sulfate Rash  . Ibandronate Sodium Rash  . Risedronate Sodium Rash    Current Outpatient Prescriptions  Medication Sig Dispense Refill  . ALPRAZolam (XANAX) 0.25 MG tablet Take 1 tablet (0.25 mg total) by mouth 2 (two) times daily as needed for anxiety. 60 tablet 0  . aspirin EC 81 MG tablet Take 81 mg by mouth daily.    . celecoxib (CELEBREX) 200 MG capsule 1 capsule by mouth twice a day when necessary with meals 135 capsule 0  . cholecalciferol (VITAMIN D) 1000 units tablet Take 1,000 Units by mouth daily.    Marland Kitchen HYDROcodone-acetaminophen (NORCO) 7.5-325 MG tablet     . ibuprofen (ADVIL,MOTRIN) 200 MG tablet Take 400 mg by mouth every 8 (eight) hours as needed (for pain.).    Marland Kitchen letrozole (FEMARA) 2.5 MG tablet Take 1 tablet (2.5 mg total)  by mouth daily. 30 tablet 11  . levothyroxine (SYNTHROID, LEVOTHROID) 100 MCG tablet Take 1 tablet (100 mcg total) by mouth daily. 30 tablet 3  . predniSONE (DELTASONE) 5 MG tablet 4 pills every morning for 7 days, 3 pills for 7 days, 2 pills for 7 days, 1 pill for 7 days, half pill for 7 days then stop. 74 tablet 0   No current facility-administered medications for this visit.     Review of Systems Review of Systems  Constitutional: Negative.   Respiratory: Negative.   Cardiovascular: Negative.     Blood pressure (!) 142/80, pulse 82, resp. rate 14, height 5\' 4"  (1.626 m), weight 172 lb (78 kg).  Physical Exam Physical Exam  Constitutional: She is oriented to person, place, and time. She appears well-developed and well-nourished.  Cardiovascular:  Normal rate, regular rhythm and normal heart sounds.   Pulmonary/Chest: Effort normal and breath sounds normal.    Left mastectomy is clean and healing well.   Neurological: She is alert and oriented to person, place, and time.  Skin: Skin is dry.      Assessment    Doing well post drain removal.  Improvement in arthritis symptoms.      The patient asked about when the port could be removed. She's been encouraged to discuss this with Dr. Grayland Ormond at her appointment in November.   She is tolerating her Femara well at this time.  The patient is presently taking prednisone 20 mg per day. Marked improvement in arthritis symptoms. Biologic will be restarted after the first of the ear.  Patient to return in one month. The patient is aware to call back for any questions or concerns.   HPI, Physical Exam, Assessment and Plan have been scribed under the direction and in the presence of Robert Bellow, MD  Concepcion Living, LPN  I have completed the exam and reviewed the above documentation for accuracy and completeness.  I agree with the above.  Haematologist has been used and any errors in dictation or transcription are unintentional.  Hervey Ard, M.D., F.A.C.S.  Robert Bellow 06/14/2017, 12:57 PM

## 2017-06-14 NOTE — Patient Instructions (Signed)
Patient to return in one month. The patient is aware to call back for any questions or concerns. 

## 2017-06-15 NOTE — Progress Notes (Signed)
G6PD is normal. Okay to call and sulfasalazine at things nicely we are trying to sulfasalazine

## 2017-06-15 NOTE — Progress Notes (Signed)
Correction: G6PD is normal. Okay to: Sulfasalazine.

## 2017-06-17 ENCOUNTER — Encounter: Payer: Self-pay | Admitting: Rheumatology

## 2017-06-17 ENCOUNTER — Telehealth: Payer: Self-pay | Admitting: Radiology

## 2017-06-17 MED ORDER — SULFASALAZINE 500 MG PO TBEC: 1000.0000 mg | DELAYED_RELEASE_TABLET | Freq: Two times a day (BID) | ORAL | 0 refills | Status: DC

## 2017-06-17 NOTE — Telephone Encounter (Signed)
-----   Message from Bo Merino, MD sent at 06/15/2017 12:40 PM EDT ----- Correction: G6PD is normal. Okay to: Sulfasalazine.

## 2017-06-18 ENCOUNTER — Encounter: Payer: Self-pay | Admitting: Family Medicine

## 2017-06-18 ENCOUNTER — Ambulatory Visit (INDEPENDENT_AMBULATORY_CARE_PROVIDER_SITE_OTHER): Payer: PPO | Admitting: Family Medicine

## 2017-06-18 VITALS — BP 130/70 | HR 78 | Temp 98.1°F | Ht 64.0 in | Wt 173.5 lb

## 2017-06-18 DIAGNOSIS — M0579 Rheumatoid arthritis with rheumatoid factor of multiple sites without organ or systems involvement: Secondary | ICD-10-CM | POA: Diagnosis not present

## 2017-06-18 DIAGNOSIS — G479 Sleep disorder, unspecified: Secondary | ICD-10-CM | POA: Diagnosis not present

## 2017-06-18 DIAGNOSIS — R000 Tachycardia, unspecified: Secondary | ICD-10-CM

## 2017-06-18 DIAGNOSIS — C50412 Malignant neoplasm of upper-outer quadrant of left female breast: Secondary | ICD-10-CM | POA: Diagnosis not present

## 2017-06-18 DIAGNOSIS — Z171 Estrogen receptor negative status [ER-]: Secondary | ICD-10-CM

## 2017-06-18 DIAGNOSIS — M810 Age-related osteoporosis without current pathological fracture: Secondary | ICD-10-CM | POA: Diagnosis not present

## 2017-06-18 DIAGNOSIS — E039 Hypothyroidism, unspecified: Secondary | ICD-10-CM | POA: Diagnosis not present

## 2017-06-18 MED ORDER — LORAZEPAM 1 MG PO TABS: 1.0000 mg | ORAL_TABLET | Freq: Every evening | ORAL | 1 refills | Status: DC | PRN

## 2017-06-18 NOTE — Assessment & Plan Note (Signed)
Sees Dr Garen Grams On prednisone currently after a bad flare  Plans to start sulfasalazine (and will then stop nsaids)  Labs a month after   Clinically improved today on 20 mg of prednisone

## 2017-06-18 NOTE — Progress Notes (Signed)
Subjective:    Patient ID: Samantha Valentine, female    DOB: [birth year], 71 y.o.   MRN: [id number]  HPI Here for f/u of last visit with tachycardia   Overall feeling much better  Surgical site is ok  Finished 10 d of septra - saw surgeon on Monday  Prednisone is really helping her RA - plans to start sulfasalazine   Putting prolia off until after the first of the year due to cost  Last dexa was stable     At that time ekg and cxr and labs were re assuring  TSH was low - so levothyroxine dose was dec from 137 to 100   Lab Results  Component Value Date   WBC 6.8 05/25/2017   HGB 13.2 05/25/2017   HCT 40.2 05/25/2017   MCV 98.0 05/25/2017   PLT 241.0 05/25/2017   Lab Results  Component Value Date   CREATININE 0.76 05/25/2017   BUN 17 05/25/2017   NA 140 05/25/2017   K 4.0 05/25/2017   CL 105 05/25/2017   CO2 29 05/25/2017   Lab Results  Component Value Date   ALT 11 05/25/2017   AST 14 05/25/2017   ALKPHOS 92 05/25/2017   BILITOT 0.3 05/25/2017   Lab Results  Component Value Date   TSH 0.18 (L) 05/25/2017    Followed by surgeon for mastectomy status  She was tx for staph as well   Wt Readings from Last 3 Encounters:  06/18/17 173 lb 8 oz (78.7 kg)  06/14/17 172 lb (78 kg)  06/11/17 172 lb (78 kg)   Pulse Readings from Last 3 Encounters:  06/18/17 78  06/14/17 82  06/11/17 88   BP Readings from Last 3 Encounters:  06/18/17 140/84  06/14/17 (!) 142/80  06/11/17 (!) 142/80  better on 2nd check BP: 130/70    Pulse is great now on the lower hypothyroid  Will re check in a month or so    Good cholesterol screen panel Lab Results  Component Value Date   CHOL 170 05/25/2017   HDL 56.90 05/25/2017   LDLCALC 96 05/25/2017   TRIG 86.0 05/25/2017   CHOLHDL 3 05/25/2017   Ativan works better than xanax  She uses it for sleep as needed (problems since her chemo) Needs refill of 1 mg dose Does not need every day     Patient Active Problem List     Diagnosis Date Noted  . Sleep disturbance 06/18/2017  . Tachycardia 05/25/2017  . Dysuria 05/25/2017  . Estrogen deficiency 03/22/2017  . Malignant neoplasm of upper-outer quadrant of left breast in female, estrogen receptor negative ([place]) 02/04/2017  . Tendinopathy of right shoulder 01/25/2017  . High risk medication use 09/29/2016  . DJD (degenerative joint disease), cervical 09/29/2016  . History of right hip replacement 09/29/2016  . Eczema 07/13/2014  . Adverse effect of immunosuppressive drug 04/27/2012  . ANXIETY DEPRESSION 10/28/2008  . Spinal stenosis 12/29/2007  . Hypothyroidism 12/28/2007  . Vitamin D deficiency 12/28/2007  . HEARING LOSS 12/28/2007  . Rheumatoid arthritis ([place]) 12/28/2007  . DDD (degenerative disc disease), lumbar 12/28/2007  . Fibromyalgia 12/28/2007  . Osteoporosis 12/28/2007  . MIGRAINES, HX OF 12/28/2007   Past Medical History:  Diagnosis Date  . Arthritis    RA  . Breast mass 1 year   . Cancer ([place]) 01/29/2017   left breast INVASIVE MAMMARY CARCINOMA, ER/PR positive  . Collagen vascular disease ([facility])    Rhematoid Arthritis  . DDD (degenerative  disc disease)    in neck  . Depression   . Fibromyalgia   . GERD (gastroesophageal reflux disease)    NO MEDS  . History of kidney stones    MULTIPLE KIDNEY STONES BIL  . Hypothyroidism   . Migraine   . Osteoporosis   . Rheumatoid arthritis [facility])    Past Surgical History:  Procedure Laterality Date  . BREAST BIOPSY Left 01/29/2017   US guided biopsy INVASIVE MAMMARY CARCINOMA  . JOINT REPLACEMENT Right 2003   total hip replacement  . LITHOTRIPSY  1997   kidney stone  . MASTECTOMY W/ SENTINEL NODE BIOPSY Left 05/18/2017   Procedure: MASTECTOMY WITH SENTINEL LYMPH NODE BIOPSY;  Surgeon: Robert Bellow, MD;  Location: [facility] ORS;  Service: General;  Laterality: Left;  . PORTACATH PLACEMENT Right 02/15/2017   Procedure: INSERTION PORT-A-CATH;  Surgeon: Robert Bellow, MD;  Location:  [facility] ORS;  Service: General;  Laterality: Right;  . TONSILLECTOMY  1970   Social History  Substance Use Topics  . Smoking status: Former Smoker    Packs/day: 1.50    Years: 25.00    Types: Cigarettes    Quit date: 10/20/1983  . Smokeless tobacco: Never Used  . Alcohol use No   Family History  Problem Relation Age of Onset  . Alcohol abuse Father   . Diabetes Father   . Stroke Father   . Hypertension Mother   . Endometrial cancer Mother   . Osteoarthritis Mother   . Breast cancer Paternal Aunt 37  . Liver disease Sister   . Breast cancer Maternal Aunt   . Rheum arthritis Maternal Grandmother   . Diabetes Paternal Grandmother   . Parkinson's disease Paternal Grandfather    Allergies  Allergen Reactions  . Adhesive [Tape] Other (See Comments)    Skin blistered (PAPER TAPE OK)  . Hydroxychloroquine Sulfate Rash  . Ibandronate Sodium Rash  . Risedronate Sodium Rash   Current Outpatient Prescriptions on File Prior to Visit  Medication Sig Dispense Refill  . aspirin EC 81 MG tablet Take 81 mg by mouth daily.    . celecoxib (CELEBREX) 200 MG capsule 1 capsule by mouth twice a day when necessary with meals 135 capsule 0  . cholecalciferol (VITAMIN D) 1000 units tablet Take 1,000 Units by mouth daily.    Marland Kitchen ibuprofen (ADVIL,MOTRIN) 200 MG tablet Take 400 mg by mouth every 8 (eight) hours as needed (for pain.).    Marland Kitchen letrozole (FEMARA) 2.5 MG tablet Take 1 tablet (2.5 mg total) by mouth daily. 30 tablet 11  . levothyroxine (SYNTHROID, LEVOTHROID) 100 MCG tablet Take 1 tablet (100 mcg total) by mouth daily. 30 tablet 3  . predniSONE (DELTASONE) 5 MG tablet 4 pills every morning for 7 days, 3 pills for 7 days, 2 pills for 7 days, 1 pill for 7 days, half pill for 7 days then stop. 74 tablet 0  . sulfaSALAzine (AZULFIDINE) 500 MG EC tablet Take 2 tablets (1,000 mg total) by mouth 2 (two) times daily. (Patient not taking: Reported on 06/18/2017) 360 tablet 0   No current  facility-administered medications on file prior to visit.      Review of Systems    Review of Systems  Constitutional: Negative for fever, appetite change, fatigue and unexpected weight change. pos for hunger on prednisone  Eyes: Negative for pain and visual disturbance.  Respiratory: Negative for cough and shortness of breath.   Cardiovascular: Negative for cp or palpitations    Gastrointestinal: Negative  for nausea, diarrhea and constipation.  Genitourinary: Negative for urgency and frequency. pos for occ mild hot flash on femara  Skin: Negative for pallor or rash  pos for mastectomy wound that is healing  MSK pos for joint pain from RA that is improved on prednisone  Neurological: Negative for weakness, light-headedness, numbness and headaches.  Hematological: Negative for adenopathy. Does not bruise/bleed easily.  Psychiatric/Behavioral: Negative for dysphoric mood. The patient is not nervous/anxious.  pos for trouble sleeping on and off since her chemotherapy     Objective:   Physical Exam  Constitutional: She appears well-developed and well-nourished. No distress.  overwt and well app  HENT:  Head: Normocephalic and atraumatic.  Mouth/Throat: Oropharynx is clear and moist.  Eyes: Pupils are equal, round, and reactive to light. Conjunctivae and EOM are normal.  Neck: Normal range of motion. Neck supple. No JVD present. Carotid bruit is not present. No thyromegaly present.  Cardiovascular: Normal rate, regular rhythm, normal heart sounds and intact distal pulses.  Exam reveals no gallop.   No murmur heard. Pulmonary/Chest: Effort normal and breath sounds normal. No respiratory distress. She has no wheezes. She has no rales.  No crackles  Abdominal: She exhibits no abdominal bruit.  Musculoskeletal: She exhibits no edema.  Limited rom of UE joints  Some deformity of RA  No kyphosis     Lymphadenopathy:    She has no cervical adenopathy.  Neurological: She is alert. She  has normal reflexes. She displays no tremor. No cranial nerve deficit. She exhibits normal muscle tone. Coordination normal.  Skin: Skin is warm and dry. No rash noted.  Hair loss from Ca tx  Psychiatric: She has a normal mood and affect. Her speech is normal and behavior is normal. Thought content normal. Her mood appears not anxious. Her affect is not blunt and not labile. Cognition and memory are normal. She does not exhibit a depressed mood.  Pleasant and talkative           Assessment & Plan:   Problem List Items Addressed This Visit      Endocrine   Hypothyroidism    Improved tachycardia since reducing dose of levothyroxine from 137 to 100 mcg  Pulse is in 70s  Feeling ok  Re check tsh in 1 mo         Musculoskeletal and Integument   Osteoporosis - Primary    Pt has a plan to start prolia  Will wait until after the first of the year in light of so many new medicines (oncol/ rheum) at one time Rev dexa  No falls or fx       Rheumatoid arthritis ([place])    Sees Dr Garen Grams On prednisone currently after a bad flare  Plans to start sulfasalazine (and will then stop nsaids)  Labs a month after   Clinically improved today on 20 mg of prednisone        Other   Malignant neoplasm of upper-outer quadrant of left breast in female, estrogen receptor negative ([place])    Done with chemo  S/p L mastectomy- healing drain site had infx and improving Continues oncol and surg f/u  On femara and tolerating well so far      Relevant Medications   LORazepam (ATIVAN) 1 MG tablet   Sleep disturbance    Problems intermittently ever since chemotx  Px for ativan 1 mg to take prn with caution of sedation and habit  Pt voiced understanding Does not need every night  Currently on femara also - may impact sleep Continue to monitor       Tachycardia    Much improved with lower levothyroxine dose  (and currently on prednsione- HR is still in the 70s) Continue to watch this

## 2017-06-18 NOTE — Patient Instructions (Addendum)
Keep up the good work with self care  Let's check thyroid in 1 month  Continue current medicines and continue f/u with surgeon/oncologist and rheumatologist  Start prolia after first of the year

## 2017-06-18 NOTE — Assessment & Plan Note (Signed)
Problems intermittently ever since chemotx  Px for ativan 1 mg to take prn with caution of sedation and habit  Pt voiced understanding Does not need every night  Currently on femara also - may impact sleep Continue to monitor

## 2017-06-18 NOTE — Assessment & Plan Note (Signed)
Pt has a plan to start prolia  Will wait until after the first of the year in light of so many new medicines (oncol/ rheum) at one time Rev dexa  No falls or fx

## 2017-06-18 NOTE — Assessment & Plan Note (Signed)
Done with chemo  S/p L mastectomy- healing drain site had infx and improving Continues oncol and surg f/u  On femara and tolerating well so far

## 2017-06-18 NOTE — Assessment & Plan Note (Signed)
Much improved with lower levothyroxine dose  (and currently on prednsione- HR is still in the 70s) Continue to watch this

## 2017-06-18 NOTE — Assessment & Plan Note (Signed)
Improved tachycardia since reducing dose of levothyroxine from 137 to 100 mcg  Pulse is in 70s  Feeling ok  Re check tsh in 1 mo

## 2017-07-15 ENCOUNTER — Encounter: Payer: Self-pay | Admitting: General Surgery

## 2017-07-15 ENCOUNTER — Ambulatory Visit (INDEPENDENT_AMBULATORY_CARE_PROVIDER_SITE_OTHER): Payer: PPO | Admitting: General Surgery

## 2017-07-15 VITALS — BP 128/70 | HR 94 | Resp 14 | Ht 64.0 in | Wt 179.0 lb

## 2017-07-15 DIAGNOSIS — Z171 Estrogen receptor negative status [ER-]: Secondary | ICD-10-CM

## 2017-07-15 DIAGNOSIS — C50412 Malignant neoplasm of upper-outer quadrant of left female breast: Secondary | ICD-10-CM

## 2017-07-15 NOTE — Patient Instructions (Signed)
Patient to return on three months.

## 2017-07-15 NOTE — Progress Notes (Signed)
Patient ID: Samantha Valentine, female   DOB: [birth year], 71 y.o.   MRN: [id number]  Chief Complaint  Patient presents with  . Breast Cancer    HPI Samantha Valentine is a 71 y.o. female here today for her one month follow up left mastectomy done on 05/18/2017.  Patient states she is doing much better on the Femara Over the last few weeks. Remains off her biologic treatment secondary to the "doughnut hole" in her drug coverage. HPI  Past Medical History:  Diagnosis Date  . Arthritis    RA  . Breast mass 1 year   . Cancer ([place]) 01/29/2017   left breast INVASIVE MAMMARY CARCINOMA, ER/PR positive  . Collagen vascular disease ([facility])    Rhematoid Arthritis  . DDD (degenerative disc disease)    in neck  . Depression   . Fibromyalgia   . GERD (gastroesophageal reflux disease)    NO MEDS  . History of kidney stones    MULTIPLE KIDNEY STONES BIL  . Hypothyroidism   . Migraine   . Osteoporosis   . Rheumatoid arthritis [facility])     Past Surgical History:  Procedure Laterality Date  . BREAST BIOPSY Left 01/29/2017   US guided biopsy INVASIVE MAMMARY CARCINOMA  . JOINT REPLACEMENT Right 2003   total hip replacement  . LITHOTRIPSY  1997   kidney stone  . MASTECTOMY W/ SENTINEL NODE BIOPSY Left 05/18/2017   Procedure: MASTECTOMY WITH SENTINEL LYMPH NODE BIOPSY;  Surgeon: Robert Bellow, MD;  Location: [facility] ORS;  Service: General;  Laterality: Left;  . PORTACATH PLACEMENT Right 02/15/2017   Procedure: INSERTION PORT-A-CATH;  Surgeon: Robert Bellow, MD;  Location: [facility] ORS;  Service: General;  Laterality: Right;  . TONSILLECTOMY  1970    Family History  Problem Relation Age of Onset  . Alcohol abuse Father   . Diabetes Father   . Stroke Father   . Hypertension Mother   . Endometrial cancer Mother   . Osteoarthritis Mother   . Breast cancer Paternal Aunt 18  . Liver disease Sister   . Breast cancer Maternal Aunt   . Rheum arthritis Maternal Grandmother   . Diabetes  Paternal Grandmother   . Parkinson's disease Paternal Grandfather     Social History Social History  Substance Use Topics  . Smoking status: Former Smoker    Packs/day: 1.50    Years: 25.00    Types: Cigarettes    Quit date: 10/20/1983  . Smokeless tobacco: Never Used  . Alcohol use No    Allergies  Allergen Reactions  . Adhesive [Tape] Other (See Comments)    Skin blistered (PAPER TAPE OK)  . Hydroxychloroquine Sulfate Rash  . Ibandronate Sodium Rash  . Risedronate Sodium Rash    Current Outpatient Prescriptions  Medication Sig Dispense Refill  . aspirin EC 81 MG tablet Take 81 mg by mouth daily.    . celecoxib (CELEBREX) 200 MG capsule 1 capsule by mouth twice a day when necessary with meals 135 capsule 0  . cholecalciferol (VITAMIN D) 1000 units tablet Take 1,000 Units by mouth daily.    Marland Kitchen ibuprofen (ADVIL,MOTRIN) 200 MG tablet Take 400 mg by mouth every 8 (eight) hours as needed (for pain.).    Marland Kitchen letrozole (FEMARA) 2.5 MG tablet Take 1 tablet (2.5 mg total) by mouth daily. 30 tablet 11  . levothyroxine (SYNTHROID, LEVOTHROID) 100 MCG tablet Take 1 tablet (100 mcg total) by mouth daily. 30 tablet 3  . LORazepam (ATIVAN) 1 MG  tablet Take 1 tablet (1 mg total) by mouth at bedtime as needed for anxiety. 30 tablet 1  . sulfaSALAzine (AZULFIDINE) 500 MG EC tablet Take 2 tablets (1,000 mg total) by mouth 2 (two) times daily. 360 tablet 0   No current facility-administered medications for this visit.     Review of Systems Review of Systems  Constitutional: Negative.   Respiratory: Negative.   Cardiovascular: Negative.     Blood pressure 128/70, pulse 94, resp. rate 14, height 5\' 4"  (1.626 m), weight 179 lb (81.2 kg).  Physical Exam Physical Exam  Constitutional: She is oriented to person, place, and time. She appears well-developed and well-nourished.  Pulmonary/Chest:    Left mastectomy site is clean and healing well.   Neurological: She is alert and oriented to  person, place, and time.  Skin: Skin is dry.  Vitals reviewed.     Assessment    Doing well status post left mastectomy.    Plan    The patient will be meeting with Dr. Grayland Ormond in November. She reports she will press him on having her PowerPort removed at that time. If he gets approval, it will be done at the December appointment.    Patient to return in three months. Port removal . The patient is aware to call back for any questions or concerns.   HPI, Physical Exam, Assessment and Plan have been scribed under the direction and in the presence of Hervey Ard, MD.  Gaspar Cola, CMA  I have completed the exam and reviewed the above documentation for accuracy and completeness.  I agree with the above.  Haematologist has been used and any errors in dictation or transcription are unintentional.  Hervey Ard, M.D., F.A.C.S.  Robert Bellow 07/15/2017, 10:22 AM

## 2017-07-22 ENCOUNTER — Other Ambulatory Visit (INDEPENDENT_AMBULATORY_CARE_PROVIDER_SITE_OTHER): Payer: PPO

## 2017-07-22 DIAGNOSIS — M0579 Rheumatoid arthritis with rheumatoid factor of multiple sites without organ or systems involvement: Secondary | ICD-10-CM | POA: Diagnosis not present

## 2017-07-22 DIAGNOSIS — E039 Hypothyroidism, unspecified: Secondary | ICD-10-CM

## 2017-07-22 DIAGNOSIS — Z79899 Other long term (current) drug therapy: Secondary | ICD-10-CM

## 2017-07-22 LAB — COMPREHENSIVE METABOLIC PANEL
ALK PHOS: 80 U/L (ref 39–117)
ALT: 9 U/L (ref 0–35)
AST: 13 U/L (ref 0–37)
Albumin: 3.7 g/dL (ref 3.5–5.2)
BUN: 22 mg/dL (ref 6–23)
CHLORIDE: 102 meq/L (ref 96–112)
CO2: 26 meq/L (ref 19–32)
Calcium: 9.2 mg/dL (ref 8.4–10.5)
Creatinine, Ser: 0.8 mg/dL (ref 0.40–1.20)
GFR: 75.43 mL/min (ref >60.00)
GLUCOSE: 97 mg/dL (ref 70–99)
POTASSIUM: 3.5 meq/L (ref 3.5–5.1)
Sodium: 138 mEq/L (ref 135–145)
Total Bilirubin: 0.3 mg/dL (ref 0.2–1.2)
Total Protein: 6.9 g/dL (ref 6.0–8.3)

## 2017-07-22 LAB — CBC WITH DIFFERENTIAL/PLATELET
BASOS PCT: 0.7 % (ref 0.0–3.0)
Basophils Absolute: 0 10*3/uL (ref 0.0–0.1)
EOS PCT: 2.7 % (ref 0.0–5.0)
Eosinophils Absolute: 0.2 10*3/uL (ref 0.0–0.7)
HCT: 39.9 % (ref 36.0–46.0)
Hemoglobin: 13 g/dL (ref 12.0–15.0)
LYMPHS ABS: 1 10*3/uL (ref 0.7–4.0)
Lymphocytes Relative: 17.2 % (ref 12.0–46.0)
MCHC: 32.7 g/dL (ref 30.0–36.0)
MCV: 93.9 fl (ref 78.0–100.0)
MONO ABS: 0.6 10*3/uL (ref 0.1–1.0)
MONOS PCT: 9.6 % (ref 3.0–12.0)
NEUTROS ABS: 4.1 10*3/uL (ref 1.4–7.7)
NEUTROS PCT: 69.8 % (ref 43.0–77.0)
Platelets: 205 10*3/uL (ref 150.0–400.0)
RBC: 4.25 Mil/uL (ref 3.87–5.11)
RDW: 14.3 % (ref 11.5–15.5)
WBC: 5.8 10*3/uL (ref 4.0–10.5)

## 2017-07-22 LAB — TSH: TSH: 21.61 u[IU]/mL — ABNORMAL HIGH (ref 0.35–4.50)

## 2017-07-22 NOTE — Addendum Note (Signed)
Addended by: Mady Haagensen on: 07/22/2017 09:24 AM   Modules accepted: Orders

## 2017-07-22 NOTE — Addendum Note (Signed)
Addended by: Mady Haagensen on: 07/22/2017 10:09 AM   Modules accepted: Orders

## 2017-07-22 NOTE — Addendum Note (Signed)
Addended by: Mady Haagensen on: 07/22/2017 08:14 AM   Modules accepted: Orders

## 2017-07-23 ENCOUNTER — Telehealth: Payer: Self-pay | Admitting: *Deleted

## 2017-07-23 MED ORDER — LEVOTHYROXINE SODIUM 125 MCG PO TABS: 125.0000 ug | ORAL_TABLET | Freq: Every day | ORAL | 3 refills | Status: DC

## 2017-07-23 NOTE — Telephone Encounter (Signed)
Rx sent to pharmacy   

## 2017-07-23 NOTE — Telephone Encounter (Signed)
-----   Message from Abner Greenspan, MD sent at 07/22/2017  2:08 PM EDT ----- She is on 100 mcg of levothyroxine Need to raise to 125 mcg daily -please call in #[phone] refils Re check TSH in 1 mo approx for hypothyroid  thanks

## 2017-07-28 NOTE — Progress Notes (Signed)
Office Visit Note  Patient: Samantha Valentine             Date of Birth: 1946-06-03           MRN: [id number]             PCP: Abner Greenspan, MD Referring: Tower, Wynelle Fanny, MD Visit Date: 08/06/2017 Occupation: @GUAROCC @    Subjective:  Pain and swelling in hands.   History of Present Illness: Samantha Valentine is a 71 y.o. female with history of sero positive rheumatoid arthritis. She's been off biologic since her diagnosis of breast cancer. She was started on sulfasalazine during her last visit. She states she continues to have pain and discomfort in her multiple joints. She's also noticed swelling in her hands. She states she was started on anastrozole but due to increased muscle and bony pain she decided to [facility] the medication this week. She noticed some improvement in her symptoms.   Activities of Daily Living:  Patient reports morning stiffness for 1 hour.   Patient Reports nocturnal pain.  Difficulty dressing/grooming: Reports Difficulty climbing stairs: Reports Difficulty getting out of chair: Reports Difficulty using hands for taps, buttons, cutlery, and/or writing: Reports   Review of Systems  Constitutional: Positive for activity change, fatigue and night sweats. Negative for appetite change, weight gain, weight loss and weakness.  HENT: Positive for mouth dryness. Negative for mouth sores, trouble swallowing, trouble swallowing and nose dryness.   Eyes: Positive for itching and dryness. Negative for pain, redness and visual disturbance.  Respiratory: Positive for shortness of breath. Negative for cough and difficulty breathing.   Cardiovascular: Negative for chest pain, palpitations, hypertension, irregular heartbeat and swelling in legs/feet.  Gastrointestinal: Positive for constipation. Negative for blood in stool and diarrhea.  Endocrine: Negative for cold intolerance, heat intolerance and increased urination.  Genitourinary: Negative for painful urination,  involuntary urination and vaginal dryness.  Musculoskeletal: Positive for arthralgias, joint pain, joint swelling, muscle weakness, morning stiffness and muscle tenderness. Negative for gait problem, myalgias and myalgias.  Skin: Negative for color change, rash, hair loss, skin tightness, ulcers and sensitivity to sunlight.  Allergic/Immunologic: Positive for susceptible to infections.  Neurological: Positive for night sweats. Negative for dizziness and memory loss.  Hematological: Negative for bruising/bleeding tendency and swollen glands.  Psychiatric/Behavioral: Positive for sleep disturbance. Negative for depressed mood. The patient is not nervous/anxious.     PMFS History:  Patient Active Problem List   Diagnosis Date Noted  . Sleep disturbance 06/18/2017  . Tachycardia 05/25/2017  . Dysuria 05/25/2017  . Estrogen deficiency 03/22/2017  . Malignant neoplasm of upper-outer quadrant of left breast in female, estrogen receptor negative ([place]) 02/04/2017  . Tendinopathy of right shoulder 01/25/2017  . High risk medication use 09/29/2016  . DJD (degenerative joint disease), cervical 09/29/2016  . History of right hip replacement 09/29/2016  . Eczema 07/13/2014  . Adverse effect of immunosuppressive drug 04/27/2012  . ANXIETY DEPRESSION 10/28/2008  . Spinal stenosis 12/29/2007  . Hypothyroidism 12/28/2007  . Vitamin D deficiency 12/28/2007  . HEARING LOSS 12/28/2007  . Rheumatoid arthritis ([place]) 12/28/2007  . DDD (degenerative disc disease), lumbar 12/28/2007  . Fibromyalgia 12/28/2007  . Osteoporosis 12/28/2007  . MIGRAINES, HX OF 12/28/2007    Past Medical History:  Diagnosis Date  . Arthritis    RA  . Breast mass 1 year   . Cancer ([place]) 01/29/2017   left breast INVASIVE MAMMARY CARCINOMA, ER/PR positive  . Collagen vascular disease ([place])  Rhematoid Arthritis  . DDD (degenerative disc disease)    in neck  . Depression   . Fibromyalgia   . GERD (gastroesophageal reflux  disease)    NO MEDS  . History of kidney stones    MULTIPLE KIDNEY STONES BIL  . Hypothyroidism   . Migraine   . Osteoporosis   . Rheumatoid arthritis ([place])     Family History  Problem Relation Age of Onset  . Alcohol abuse Father   . Diabetes Father   . Stroke Father   . Hypertension Mother   . Endometrial cancer Mother   . Osteoarthritis Mother   . Breast cancer Paternal Aunt 51  . Liver disease Sister   . Breast cancer Maternal Aunt   . Rheum arthritis Maternal Grandmother   . Diabetes Paternal Grandmother   . Parkinson's disease Paternal Grandfather    Past Surgical History:  Procedure Laterality Date  . BREAST BIOPSY Left 01/29/2017   US guided biopsy INVASIVE MAMMARY CARCINOMA  . HIP ARTHROPLASTY    . JOINT REPLACEMENT Right 2003   total hip replacement  . LITHOTRIPSY  1997   kidney stone  . MASTECTOMY W/ SENTINEL NODE BIOPSY Left 05/18/2017   Procedure: MASTECTOMY WITH SENTINEL LYMPH NODE BIOPSY;  Surgeon: Robert Bellow, MD;  Location: [facility] ORS;  Service: General;  Laterality: Left;  . PORTACATH PLACEMENT Right 02/15/2017   Procedure: INSERTION PORT-A-CATH;  Surgeon: Robert Bellow, MD;  Location: [facility] ORS;  Service: General;  Laterality: Right;  . TONSILLECTOMY  1970   Social History   Social History Narrative  . No narrative on file     Objective: Vital Signs: BP (!) 157/86 (BP Location: Right Arm, Patient Position: Sitting, Cuff Size: Normal)   Pulse 89   Resp 17   Ht 5\' 4"  (1.626 m)   Wt 179 lb (81.2 kg)   BMI 30.73 kg/m    Physical Exam  Constitutional: She is oriented to person, place, and time. She appears well-developed and well-nourished.  HENT:  Head: Normocephalic and atraumatic.  Eyes: Conjunctivae and EOM are normal.  Neck: Normal range of motion.  Cardiovascular: Normal rate, regular rhythm, normal heart sounds and intact distal pulses.   Pulmonary/Chest: Effort normal and breath sounds normal.  Abdominal: Soft. Bowel sounds  are normal.  Lymphadenopathy:    She has no cervical adenopathy.  Neurological: She is alert and oriented to person, place, and time.  Skin: Skin is warm and dry. Capillary refill takes less than 2 seconds.  Psychiatric: She has a normal mood and affect. Her behavior is normal.  Nursing note and vitals reviewed.    Musculoskeletal Exam: C-spine is stiffness in range of motion shoulder joints elbow joints are good range of motion. She is very limited range of motion of her wrist joints with synovitis. She synovitis over MCPs PIPs as described below. She painful range of motion of her hip joints and knee joints swelling or ankle joints. Tenderness across MTP joints.  CDAI Exam: CDAI Homunculus Exam:   Tenderness:  RUE: wrist LUE: wrist Right hand: 2nd MCP and 3rd PIP Left hand: 2nd MCP RLE: tibiofemoral and tibiotalar LLE: tibiofemoral and tibiotalar  Swelling:  RUE: wrist LUE: wrist Right hand: 2nd MCP and 3rd PIP Left hand: 2nd MCP RLE: tibiotalar LLE: tibiotalar  Joint Counts:  CDAI Tender Joint count: 7 CDAI Swollen Joint count: 5  Global Assessments:  Patient Global Assessment: 8 Provider Global Assessment: 8  CDAI Calculated Score: 28  Investigation: No additional findings.TB Gold: Negative in 06/2016 CBC Latest Ref Rng & Units 07/22/2017 05/25/2017 04/14/2017  WBC 4.0 - 10.5 K/uL 5.8 6.8 4.6  Hemoglobin 12.0 - 15.0 g/dL 13.0 13.2 11.1(L)  Hematocrit 36.0 - 46.0 % 39.9 40.2 32.3(L)  Platelets 150.0 - 400.0 K/uL 205.0 241.0 323   CMP Latest Ref Rng & Units 07/22/2017 05/25/2017 04/14/2017  Glucose 70 - 99 mg/dL 97 97 97  BUN 6 - 23 mg/dL 22 17 13   Creatinine 0.40 - 1.20 mg/dL 0.80 0.76 0.47  Sodium 135 - 145 mEq/L 138 140 139  Potassium 3.5 - 5.1 mEq/L 3.5 4.0 3.5  Chloride 96 - 112 mEq/L 102 105 108  CO2 19 - 32 mEq/L 26 29 25   Calcium 8.4 - 10.5 mg/dL 9.2 9.2 8.6(L)  Total Protein 6.0 - 8.3 g/dL 6.9 6.9 6.3(L)  Total Bilirubin 0.2 - 1.2 mg/dL 0.3 0.3 0.4    Alkaline Phos 39 - 117 U/L 80 92 77  AST 0 - 37 U/L 13 14 26   ALT 0 - 35 U/L 9 11 15     Imaging: No results found.  Speciality Comments: No specialty comments available.    Procedures:  No procedures performed Allergies: Adhesive [tape]; Hydroxychloroquine sulfate; Ibandronate sodium; and Risedronate sodium   Assessment / Plan:     Visit Diagnoses: Seropositive rheumatoid arthritis of multiple sites ([place]) - Positive RF with erosive disease.Patient is having a severe flare with swelling and discomfort in multiple joints as described above. She's having using her hands. We had detailed discussion regarding treatment options. Her oncologist is approved for her to go back on Enbrel. Indications side effects contraindications of Enbrel were reviewed again. She wants to go back on Enbrel. Although her insurance may not cover until the beginning of next year. I've given her some samples of Enbrel. She may be able to use it every other week until January. I will check her labs in a month and then every 3 months to monitor for drug toxicity after starting Enbrel. I will also check TB gold with her next lab in 1 month.  Medication counseling:    Does patient have diagnosis of heart failure?  No  Counseled patient that Enbrel is a TNF blocking agent.  Reviewed Enbrel dose of 50 mg once weekly.  Counseled patient on purpose, proper use, and adverse effects of Enbrel.  Reviewed the most common adverse effects including infections, headache, and injection site reactions. Discussed that there is the possibility of an increased risk of malignancy but it is not well understood if this increased risk is due to the medication or the disease state.  Advised patient to get yearly dermatology exams due to risk of skin cancer.  Reviewed the importance of regular labs while on Enbrel therapy.  Advised patient to get standing labs one month after starting Enbrel then every 2 months.  Provided patient with standing  lab orders.  Counseled patient that Enbrel should be held prior to scheduled surgery.  Counseled patient to avoid live vaccines while on Enbrel.  Advised patient to get annual influenza vaccine and the pneumococcal vaccine as needed.  Provided patient with medication education material and answered all questions.  Patient voiced understanding.  Patient consented to Enbrel.  Will upload consent into the media tab.  Reviewed storage instructions for Enbrel.  Advised initial injection must be administered in office.  Patient voiced understanding.    High risk medication use - SSZ 500 mg 2 tablets by mouth twice  a day. Her labs are stable. Although sulfasalazine is not effective. I will continue sulfasalazine and Enbrel combination for right now as she'll be on low-dose Enbrel.  History of right hip replacement: Doing well  DDD (degenerative disc disease), cervical: She is having increasing stiffness and pain which I believe is related to rheumatoid arthritis.  DDD (degenerative disc disease), lumbar: Chronic discomfort  Fibromyalgia: It causes generalized pain as well.  Vitamin D deficiency: She is on vitamin D.  Osteopenia of multiple sites - DEXA scan 2014   History of hypothyroidism  Malignant neoplasm of upper-outer quadrant of left breast in female, estrogen receptor negative ([place]): Patient recently stopped anastrozole due to increase myalgias and arthralgias. She also stopped her chemotherapy.  History of anxiety: Continues to be an issue.  History of depression: Her depression appears to be better compared to last visit.  History of hearing loss  History of migraine    Orders: Orders Placed This Encounter  Procedures  . CBC with Differential/Platelet  . COMPLETE METABOLIC PANEL WITH GFR  . Quantiferon tb gold assay   No orders of the defined types were placed in this encounter.   Face-to-face time spent with patient was 30 minutes. Greater than 50% of time was spent in  counseling and coordination of care.  Follow-Up Instructions: Return in about 3 months (around 11/06/2017) for Rheumatoid arthritis.   Bo Merino, MD  Note - This record has been created using Editor, commissioning.  Chart creation errors have been sought, but may not always  have been located. Such creation errors do not reflect on  the standard of medical care.

## 2017-08-06 ENCOUNTER — Encounter: Payer: Self-pay | Admitting: Rheumatology

## 2017-08-06 ENCOUNTER — Telehealth: Payer: Self-pay

## 2017-08-06 ENCOUNTER — Ambulatory Visit (INDEPENDENT_AMBULATORY_CARE_PROVIDER_SITE_OTHER): Payer: PPO | Admitting: Rheumatology

## 2017-08-06 VITALS — BP 157/86 | HR 89 | Resp 17 | Ht 64.0 in | Wt 179.0 lb

## 2017-08-06 DIAGNOSIS — Z171 Estrogen receptor negative status [ER-]: Secondary | ICD-10-CM

## 2017-08-06 DIAGNOSIS — M797 Fibromyalgia: Secondary | ICD-10-CM

## 2017-08-06 DIAGNOSIS — Z79899 Other long term (current) drug therapy: Secondary | ICD-10-CM

## 2017-08-06 DIAGNOSIS — M0579 Rheumatoid arthritis with rheumatoid factor of multiple sites without organ or systems involvement: Secondary | ICD-10-CM

## 2017-08-06 DIAGNOSIS — M5136 Other intervertebral disc degeneration, lumbar region: Secondary | ICD-10-CM | POA: Diagnosis not present

## 2017-08-06 DIAGNOSIS — E559 Vitamin D deficiency, unspecified: Secondary | ICD-10-CM

## 2017-08-06 DIAGNOSIS — Z8639 Personal history of other endocrine, nutritional and metabolic disease: Secondary | ICD-10-CM | POA: Diagnosis not present

## 2017-08-06 DIAGNOSIS — C50412 Malignant neoplasm of upper-outer quadrant of left female breast: Secondary | ICD-10-CM | POA: Diagnosis not present

## 2017-08-06 DIAGNOSIS — M67911 Unspecified disorder of synovium and tendon, right shoulder: Secondary | ICD-10-CM | POA: Diagnosis not present

## 2017-08-06 DIAGNOSIS — M8589 Other specified disorders of bone density and structure, multiple sites: Secondary | ICD-10-CM | POA: Diagnosis not present

## 2017-08-06 DIAGNOSIS — Z8659 Personal history of other mental and behavioral disorders: Secondary | ICD-10-CM | POA: Diagnosis not present

## 2017-08-06 DIAGNOSIS — Z9225 Personal history of immunosupression therapy: Secondary | ICD-10-CM

## 2017-08-06 DIAGNOSIS — Z8669 Personal history of other diseases of the nervous system and sense organs: Secondary | ICD-10-CM

## 2017-08-06 DIAGNOSIS — M5030 Other cervical disc degeneration, unspecified cervical region: Secondary | ICD-10-CM

## 2017-08-06 DIAGNOSIS — Z96641 Presence of right artificial hip joint: Secondary | ICD-10-CM

## 2017-08-06 NOTE — Telephone Encounter (Signed)
Filled out patient assistance application for Enbrel with patient while in clinic. Application was faxed to [employer]. Will update once we receive a response.   Phone: [phone] Fax: [phone]  Demetrios Loll, CPhT 11:09 AM

## 2017-08-06 NOTE — Patient Instructions (Signed)
Etanercept injection What is this medicine? ETANERCEPT (et a NER sept) is used for the treatment of rheumatoid arthritis in adults and children. The medicine is also used to treat psoriatic arthritis, ankylosing spondylitis, and psoriasis. This medicine may be used for other purposes; ask your health care provider or pharmacist if you have questions. COMMON BRAND NAME(S): Enbrel What should I tell my health care provider before I take this medicine? They need to know if you have any of these conditions: -blood disorders -cancer -congestive heart failure -diabetes -exposure to chickenpox -immune system problems -infection -multiple sclerosis -seizure disorder -tuberculosis, a positive skin test for tuberculosis or have recently been in close contact with someone who has tuberculosis -Wegener's granulomatosis -an unusual or allergic reaction to etanercept, latex, other medicines, foods, dyes, or preservatives -pregnant or trying to get pregnant -breast-feeding How should I use this medicine? The medicine is given by injection under the skin. You will be taught how to prepare and give this medicine. Use exactly as directed. Take your medicine at regular intervals. Do not take your medicine more often than directed. It is important that you put your used needles and syringes in a special sharps container. Do not put them in a trash can. If you do not have a sharps container, call your pharmacist or healthcare provider to get one. A special MedGuide will be given to you by the pharmacist with each prescription and refill. Be sure to read this information carefully each time. Talk to your pediatrician regarding the use of this medicine in children. While this drug may be prescribed for children as young as 4 years of age for selected conditions, precautions do apply. Overdosage: If you think you have taken too much of this medicine contact a poison control center or emergency room at once. NOTE:  This medicine is only for you. Do not share this medicine with others. What if I miss a dose? If you miss a dose, contact your health care professional to find out when you should take your next dose. Do not take double or extra doses without advice. What may interact with this medicine? Do not take this medicine with any of the following medications: -anakinra This medicine may also interact with the following medications: -cyclophosphamide -sulfasalazine -vaccines This list may not describe all possible interactions. Give your health care provider a list of all the medicines, herbs, non-prescription drugs, or dietary supplements you use. Also tell them if you smoke, drink alcohol, or use illegal drugs. Some items may interact with your medicine. What should I watch for while using this medicine? Tell your doctor or healthcare professional if your symptoms do not start to get better or if they get worse. You will be tested for tuberculosis (TB) before you start this medicine. If your doctor prescribes any medicine for TB, you should start taking the TB medicine before starting this medicine. Make sure to finish the full course of TB medicine. Call your doctor or health care professional for advice if you get a fever, chills or sore throat, or other symptoms of a cold or flu. Do not treat yourself. This drug decreases your body's ability to fight infections. Try to avoid being around people who are sick. What side effects may I notice from receiving this medicine? Side effects that you should report to your doctor or health care professional as soon as possible: -allergic reactions like skin rash, itching or hives, swelling of the face, lips, or tongue -changes in vision -fever, chills   or any other sign of infection -numbness or tingling in legs or other parts of the body -red, scaly patches or raised bumps on the skin -shortness of breath or difficulty breathing -swollen lymph nodes in the  neck, underarm, or groin areas -unexplained weight loss -unusual bleeding or bruising -unusual swelling or fluid retention in the legs -unusually weak or tired Side effects that usually do not require medical attention (report to your doctor or health care professional if they continue or are bothersome): -dizziness -headache -nausea -redness, itching, or swelling at the injection site -vomiting This list may not describe all possible side effects. Call your doctor for medical advice about side effects. You may report side effects to FDA at 1-800-FDA-1088. Where should I keep my medicine? Keep out of the reach of children. Store between 2 and 8 degrees C (36 and 46 degrees F). Do not freeze or shake. Protect from light. Throw away any unused medicine after the expiration date. You will be instructed on how to store this medicine. NOTE: This sheet is a summary. It may not cover all possible information. If you have questions about this medicine, talk to your doctor, pharmacist, or health care provider.  2018 Elsevier/Gold Standard (2012-04-11 15:33:36)  

## 2017-08-18 NOTE — Telephone Encounter (Signed)
Called [employer] foundation to check the status of patient's application. Spoke with Shirlean Mylar who states that they have only received the first two pages of application for patient.   Re-faxed entire application to [employer]. Will update once we receive a response.   Mitchelle Goerner, [place], CPhT 11:21 AM

## 2017-08-23 NOTE — Progress Notes (Signed)
[place]  [phone] [phone]  ID: Samantha Valentine OB: 1946-04-15  MR#: [id number]  EEF#:[id number]  Patient Care Team: Samantha Greenspan, MD as PCP - [facility], Wynelle Fanny, MD as Consulting Physician (Family Medicine) Bary Castilla, Forest Gleason, MD (General Surgery)  CHIEF COMPLAINT: Pathologic stage IA ER/PR positive, HER-2 negative invasive carcinoma of the upper-outer quadrant of the left breast.  INTERVAL HISTORY: Patient returns to clinic today for further evaluation.  She was have significant joint pain from letrozole, which is now resolved since discontinuing treatment approximately 3 weeks ago.  She is now fully recovered from her mastectomy.  She currently feels well and is asymptomatic.  She has no neurologic complaints.  She denies any recent fevers or illnesses.  She has good appetite and denies weight loss.  She has no chest pain or shortness of breath.  She denies any nausea, vomiting, constipation, or diarrhea.  She has no urinary complaints.  Patient does not complain of joint pain today.  Patient offers no further specific complaints today.  REVIEW OF SYSTEMS:   Review of Systems  Constitutional: Negative for fever, malaise/fatigue and weight loss.  Respiratory: Negative.  Negative for cough and shortness of breath.   Cardiovascular: Negative.  Negative for chest pain and leg swelling.  Gastrointestinal: Negative for abdominal pain, diarrhea and nausea.  Genitourinary: Negative.   Musculoskeletal: Positive for joint pain.  Skin: Negative.  Negative for rash.  Neurological: Negative for sensory change and weakness.  Psychiatric/Behavioral: Negative.  The patient is not nervous/anxious.     As per HPI. Otherwise, a complete review of systems is negative.  PAST MEDICAL HISTORY: Past Medical History:  Diagnosis Date  . Arthritis    RA  . Breast mass 1 year   . Cancer ([place]) 01/29/2017   left breast INVASIVE MAMMARY CARCINOMA,  ER/PR positive  . Collagen vascular disease (HCC)    Rhematoid Arthritis  . DDD (degenerative disc disease)    in neck  . Depression   . Fibromyalgia   . GERD (gastroesophageal reflux disease)    NO MEDS  . History of kidney stones    MULTIPLE KIDNEY STONES BIL  . Hypothyroidism   . Migraine   . Osteoporosis   . Rheumatoid arthritis ([place])     PAST SURGICAL HISTORY: Past Surgical History:  Procedure Laterality Date  . BREAST BIOPSY Left 01/29/2017   US guided biopsy INVASIVE MAMMARY CARCINOMA  . HIP ARTHROPLASTY    . JOINT REPLACEMENT Right 2003   total hip replacement  . LITHOTRIPSY  1997   kidney stone  . TONSILLECTOMY  1970    FAMILY HISTORY: Family History  Problem Relation Age of Onset  . Alcohol abuse Father   . Diabetes Father   . Stroke Father   . Hypertension Mother   . Endometrial cancer Mother   . Osteoarthritis Mother   . Breast cancer Paternal Aunt 16  . Liver disease Sister   . Breast cancer Maternal Aunt   . Rheum arthritis Maternal Grandmother   . Diabetes Paternal Grandmother   . Parkinson's disease Paternal Grandfather     ADVANCED DIRECTIVES (Y/N):  N  HEALTH MAINTENANCE: Social History   Tobacco Use  . Smoking status: Former Smoker    Packs/day: 1.50    Years: 25.00    Pack years: 37.50    Types: Cigarettes    Last attempt to quit: 10/20/1983    Years since quitting: 33.8  . Smokeless tobacco: Never Used  Substance Use Topics  . Alcohol use: No    Alcohol/week: 0.0 oz  . Drug use: No     Colonoscopy:  PAP:  Bone density:  Lipid panel:  Allergies  Allergen Reactions  . Adhesive [Tape] Other (See Comments)    Skin blistered (PAPER TAPE OK)  . Hydroxychloroquine Sulfate Rash  . Ibandronate Sodium Rash  . Risedronate Sodium Rash    Current Outpatient Medications  Medication Sig Dispense Refill  . aspirin EC 81 MG tablet Take 81 mg by mouth daily.    . celecoxib (CELEBREX) 200 MG capsule 1 capsule by mouth twice a day  when necessary with meals 135 capsule 0  . cholecalciferol (VITAMIN D) 1000 units tablet Take 1,000 Units by mouth daily.    Marland Kitchen levothyroxine (SYNTHROID, LEVOTHROID) 125 MCG tablet Take 1 tablet (125 mcg total) by mouth daily. 30 tablet 3  . LORazepam (ATIVAN) 1 MG tablet Take 1 tablet (1 mg total) by mouth at bedtime as needed for anxiety. 30 tablet 1   No current facility-administered medications for this visit.     OBJECTIVE: Vitals:   08/25/17 1217  BP: (!) 155/98  Pulse: 99  Resp: 20  Temp: 98.9 F (37.2 C)     Body mass index is 30.42 kg/m.    ECOG FS:0 - Asymptomatic  General: Ill-appearing, no acute distress. Eyes: Pink conjunctiva, anicteric sclera. Breasts: Left mastectomy.  Patient declined exam today. Lungs: Clear to auscultation bilaterally. Heart: Regular rate and rhythm. No rubs, murmurs, or gallops. Abdomen: Soft, nontender, nondistended. No organomegaly noted, normoactive bowel sounds. Musculoskeletal: No edema, cyanosis, or clubbing. Neuro: Alert, answering all questions appropriately. Cranial nerves grossly intact. Skin: No rashes or petechiae noted. Psych: Normal affect.   LAB RESULTS:  Lab Results  Component Value Date   NA 138 07/22/2017   K 3.5 07/22/2017   CL 102 07/22/2017   CO2 26 07/22/2017   GLUCOSE 97 07/22/2017   BUN 22 07/22/2017   CREATININE 0.80 07/22/2017   CALCIUM 9.2 07/22/2017   PROT 6.9 07/22/2017   ALBUMIN 3.7 07/22/2017   AST 13 07/22/2017   ALT 9 07/22/2017   ALKPHOS 80 07/22/2017   BILITOT 0.3 07/22/2017   GFRNONAA >60 04/14/2017   GFRAA >60 04/14/2017    Lab Results  Component Value Date   WBC 5.8 07/22/2017   NEUTROABS 4.1 07/22/2017   HGB 13.0 07/22/2017   HCT 39.9 07/22/2017   MCV 93.9 07/22/2017   PLT 205.0 07/22/2017     STUDIES: No results found.  ASSESSMENT: Pathologic stage IA ER/PR positive, HER-2 negative invasive carcinoma of the upper-outer quadrant of the left breast  PLAN:    1. Pathologic  stage IA ER/PR positive, HER-2 negative invasive carcinoma of the upper-outer quadrant of the left breast: Patient completed only 3 cycles of Adriamycin and Cytoxan on March 24, 2017. She declined any further neoadjuvant chemotherapy. Because she had a full mastectomy, XRT is not necessary.  Patient was initially started on letrozole, but because of significant joint pain this is now been switched to anastrozole.  Continue treatment for a total of 5 years completing in August 2023.  She has expressed understanding that by not completing treatment as recommended, she is at higher risk for recurrent disease. No further intervention is needed. Return to clinic in 3 months for further evaluation.   2. Rheumatoid arthritis: Patient has discontinued Enbrel since the diagnosis of her malignancy. Since this is a solid tumor, okay to restart from an oncology  standpoint once chemotherapy has been completed.  3. Osteopenia: Patient had a bone mineral density on May 06, 2017 revealed a T score of -2.4. Continue monitoring treatment per rheumatology.  Approximately 30 minutes spent in discussion of which greater than 50% was consultation.  Patient expressed understanding and was in agreement with this plan. She also understands that She can call clinic at any time with any questions, concerns, or complaints.   Cancer Staging Malignant neoplasm of upper-outer quadrant of left breast in female, estrogen receptor negative ([place]) Staging form: Breast, AJCC 8th Edition - Clinical stage from 02/04/2017: Stage IB (cT2, cN0, cM0, G2, ER: Positive, PR: Positive, HER2: Negative) - Signed by Lloyd Huger, MD on 02/15/2017 - Pathologic stage from 05/29/2017: Stage IA (pT2, pN0, cM0, G2, ER: Positive, PR: Positive, HER2: Negative) - Signed by Lloyd Huger, MD on 05/29/2017   Lloyd Huger, MD   08/25/2017 2:42 PM

## 2017-08-25 ENCOUNTER — Inpatient Hospital Stay: Payer: PPO

## 2017-08-25 ENCOUNTER — Encounter: Payer: Self-pay | Admitting: Oncology

## 2017-08-25 ENCOUNTER — Inpatient Hospital Stay: Payer: PPO | Attending: Oncology | Admitting: Oncology

## 2017-08-25 ENCOUNTER — Other Ambulatory Visit: Payer: Self-pay

## 2017-08-25 VITALS — BP 155/98 | HR 99 | Temp 98.9°F | Resp 20 | Wt 177.2 lb

## 2017-08-25 DIAGNOSIS — E039 Hypothyroidism, unspecified: Secondary | ICD-10-CM | POA: Insufficient documentation

## 2017-08-25 DIAGNOSIS — Z79811 Long term (current) use of aromatase inhibitors: Secondary | ICD-10-CM | POA: Insufficient documentation

## 2017-08-25 DIAGNOSIS — Z803 Family history of malignant neoplasm of breast: Secondary | ICD-10-CM | POA: Insufficient documentation

## 2017-08-25 DIAGNOSIS — K219 Gastro-esophageal reflux disease without esophagitis: Secondary | ICD-10-CM | POA: Diagnosis not present

## 2017-08-25 DIAGNOSIS — Z87442 Personal history of urinary calculi: Secondary | ICD-10-CM | POA: Insufficient documentation

## 2017-08-25 DIAGNOSIS — Z87891 Personal history of nicotine dependence: Secondary | ICD-10-CM | POA: Diagnosis not present

## 2017-08-25 DIAGNOSIS — Z7982 Long term (current) use of aspirin: Secondary | ICD-10-CM | POA: Diagnosis not present

## 2017-08-25 DIAGNOSIS — C50412 Malignant neoplasm of upper-outer quadrant of left female breast: Secondary | ICD-10-CM

## 2017-08-25 DIAGNOSIS — Z79899 Other long term (current) drug therapy: Secondary | ICD-10-CM | POA: Diagnosis not present

## 2017-08-25 DIAGNOSIS — M069 Rheumatoid arthritis, unspecified: Secondary | ICD-10-CM

## 2017-08-25 DIAGNOSIS — Z9012 Acquired absence of left breast and nipple: Secondary | ICD-10-CM | POA: Insufficient documentation

## 2017-08-25 DIAGNOSIS — Z9221 Personal history of antineoplastic chemotherapy: Secondary | ICD-10-CM | POA: Diagnosis not present

## 2017-08-25 DIAGNOSIS — Z170 Estrogen receptor positive status [ER+]: Secondary | ICD-10-CM | POA: Diagnosis not present

## 2017-08-25 DIAGNOSIS — M8580 Other specified disorders of bone density and structure, unspecified site: Secondary | ICD-10-CM

## 2017-08-25 DIAGNOSIS — M797 Fibromyalgia: Secondary | ICD-10-CM | POA: Diagnosis not present

## 2017-08-25 DIAGNOSIS — Z171 Estrogen receptor negative status [ER-]: Secondary | ICD-10-CM

## 2017-08-25 NOTE — Progress Notes (Signed)
Patient here today for follow up regarding breast cancer. Patient reports she is not taking letrozole, stopped about 3 weeks ago due to muscle/joint pain.

## 2017-08-27 NOTE — Telephone Encounter (Signed)
[place] to check the status of patient's application. Spoke with Roselyn Reef who states that the patient's application has been received and is in the benefits investigation stage. We should receive a response from them via fax soon.   Will update once we receive a response.   Called patient to update. Patient voices understanding and denies any questions at this time.  Jaylise Peek, [place], CPhT 8:59 AM

## 2017-09-03 ENCOUNTER — Other Ambulatory Visit (INDEPENDENT_AMBULATORY_CARE_PROVIDER_SITE_OTHER): Payer: PPO

## 2017-09-03 ENCOUNTER — Telehealth: Payer: Self-pay | Admitting: Family Medicine

## 2017-09-03 DIAGNOSIS — E039 Hypothyroidism, unspecified: Secondary | ICD-10-CM | POA: Diagnosis not present

## 2017-09-03 DIAGNOSIS — Z9225 Personal history of immunosupression therapy: Secondary | ICD-10-CM

## 2017-09-03 LAB — TSH: TSH: 2.75 u[IU]/mL (ref 0.35–4.50)

## 2017-09-03 NOTE — Telephone Encounter (Signed)
Lab Results  Component Value Date   TSH 2.75 09/03/2017   TSH today is normal  Let her know that she should stay on the same dose  Re check 3 months

## 2017-09-06 DIAGNOSIS — H353132 Nonexudative age-related macular degeneration, bilateral, intermediate dry stage: Secondary | ICD-10-CM | POA: Diagnosis not present

## 2017-09-06 NOTE — Telephone Encounter (Signed)
Per DPR left voicemail letting pt know Dr. Marliss Coots comments and requesting pt call back to schedule lab appt in 3 months (CRM created)

## 2017-09-07 NOTE — Telephone Encounter (Signed)
Called foundation to check the status of patient's application. Spoke to [place] who states that the application is pending a benefits investigation. Will update once we receive a response.   Revella Shelton, [place], CPhT 2:33 PM

## 2017-09-08 LAB — QUANTIFERON TB GOLD ASSAY (BLOOD)
QUANTIFERON NIL VALUE: 0.07 [IU]/mL
QUANTIFERON TB AG MINUS NIL: 0.01 [IU]/mL
QUANTIFERON(R)-TB GOLD: NEGATIVE

## 2017-09-15 NOTE — Telephone Encounter (Signed)
Received a fax from [employer] stating that the patient has been approved to receive Enbrel completely free of charge from 09/15/2017 through 10/18/2018.   Please contact patient to update and schedule a nursing visit for restart of Enbrel. Thanks!  Will send document to scan center.   Crystian Frith, [place], CPhT 2:30 PM

## 2017-09-16 ENCOUNTER — Encounter: Payer: Self-pay | Admitting: *Deleted

## 2017-09-16 NOTE — Telephone Encounter (Signed)
Patient advised she has been approved for Enbrel through the [employer]. Patient provided with number to [employer] so she may set up delivery of medication. Patient has still been on her medication by samples provided to her from here in the office. Patient will not need a restart visit here in the office.

## 2017-09-18 ENCOUNTER — Encounter: Payer: Self-pay | Admitting: Family Medicine

## 2017-09-18 HISTORY — PX: PORT-A-CATH REMOVAL: SHX5289

## 2017-09-20 NOTE — Telephone Encounter (Signed)
Samantha Valentine- can you work on getting her set up for Prolia?  She has OP  Is on anti estrogen tx for breast cancer Let me know  Thanks!

## 2017-09-21 ENCOUNTER — Ambulatory Visit (INDEPENDENT_AMBULATORY_CARE_PROVIDER_SITE_OTHER): Payer: PPO | Admitting: General Surgery

## 2017-09-21 ENCOUNTER — Encounter: Payer: Self-pay | Admitting: General Surgery

## 2017-09-21 VITALS — BP 132/84 | HR 84 | Resp 14 | Ht 64.0 in | Wt 176.0 lb

## 2017-09-21 DIAGNOSIS — C50412 Malignant neoplasm of upper-outer quadrant of left female breast: Secondary | ICD-10-CM | POA: Diagnosis not present

## 2017-09-21 DIAGNOSIS — Z171 Estrogen receptor negative status [ER-]: Secondary | ICD-10-CM | POA: Diagnosis not present

## 2017-09-21 NOTE — Telephone Encounter (Signed)
Spoke to the prolia rep who states she is a perfect candidate. I will process her benefits today

## 2017-09-21 NOTE — Patient Instructions (Addendum)
May shower May remove dressing in 2-3 days Steri strips will gradually come off over 2-3 weeks May use an Ice pack as needed for comfort  Follow up in 3 months with right screening mammogram.

## 2017-09-21 NOTE — Progress Notes (Signed)
Patient ID: Samantha Valentine, female   DOB: 07-13-1946, 71 y.o.   MRN: [id number]  Chief Complaint  Patient presents with  . Follow-up  . Procedure    HPI Samantha Valentine is a 71 y.o. female.  Here for follow up breast cancer and port removal. No new breast issues. The patient reports she's feeling much better since she resumed her Enbrel therapy in early November. She is here with her friend, Harden Mo.  HPI  Past Medical History:  Diagnosis Date  . Arthritis    RA  . Breast mass 1 year   . Cancer ([place]) 01/29/2017   left breast INVASIVE MAMMARY CARCINOMA, ER/PR positive  . Collagen vascular disease (HCC)    Rhematoid Arthritis  . DDD (degenerative disc disease)    in neck  . Depression   . Fibromyalgia   . GERD (gastroesophageal reflux disease)    NO MEDS  . History of kidney stones    MULTIPLE KIDNEY STONES BIL  . Hypothyroidism   . Migraine   . Osteoporosis   . Rheumatoid arthritis [facility])     Past Surgical History:  Procedure Laterality Date  . BREAST BIOPSY Left 01/29/2017   US guided biopsy INVASIVE MAMMARY CARCINOMA  . HIP ARTHROPLASTY    . JOINT REPLACEMENT Right 2003   total hip replacement  . LITHOTRIPSY  1997   kidney stone  . MASTECTOMY W/ SENTINEL NODE BIOPSY Left 05/18/2017   Procedure: MASTECTOMY WITH SENTINEL LYMPH NODE BIOPSY;  Surgeon: Robert Bellow, MD;  Location: [facility] ORS;  Service: General;  Laterality: Left;  . PORTACATH PLACEMENT Right 02/15/2017   Procedure: INSERTION PORT-A-CATH;  Surgeon: Robert Bellow, MD;  Location: [facility] ORS;  Service: General;  Laterality: Right;  . TONSILLECTOMY  1970    Family History  Problem Relation Age of Onset  . Alcohol abuse Father   . Diabetes Father   . Stroke Father   . Hypertension Mother   . Endometrial cancer Mother   . Osteoarthritis Mother   . Breast cancer Paternal Aunt 68  . Liver disease Sister   . Breast cancer Maternal Aunt   . Rheum arthritis Maternal Grandmother   .  Diabetes Paternal Grandmother   . Parkinson's disease Paternal Grandfather     Social History Social History   Tobacco Use  . Smoking status: Former Smoker    Packs/day: 1.50    Years: 25.00    Pack years: 37.50    Types: Cigarettes    Last attempt to quit: 10/20/1983    Years since quitting: 33.9  . Smokeless tobacco: Never Used  Substance Use Topics  . Alcohol use: No    Alcohol/week: 0.0 oz  . Drug use: No    Allergies  Allergen Reactions  . Adhesive [Tape] Other (See Comments)    Skin blistered (PAPER TAPE OK)  . Hydroxychloroquine Sulfate Rash  . Ibandronate Sodium Rash  . Risedronate Sodium Rash    Current Outpatient Medications  Medication Sig Dispense Refill  . aspirin EC 81 MG tablet Take 81 mg by mouth daily.    . celecoxib (CELEBREX) 200 MG capsule 1 capsule by mouth twice a day when necessary with meals 135 capsule 0  . cholecalciferol (VITAMIN D) 1000 units tablet Take 1,000 Units by mouth daily.    Marland Kitchen etanercept (ENBREL) 50 MG/ML injection Inject 50 mg into the skin once a week.    . levothyroxine (SYNTHROID, LEVOTHROID) 125 MCG tablet Take 1 tablet (125 mcg total) by  mouth daily. 30 tablet 3  . LORazepam (ATIVAN) 1 MG tablet Take 1 tablet (1 mg total) by mouth at bedtime as needed for anxiety. 30 tablet 1   No current facility-administered medications for this visit.     Review of Systems Review of Systems  Constitutional: Negative.   Respiratory: Negative.   Cardiovascular: Negative.     Blood pressure 132/84, pulse 84, resp. rate 14, height 5\' 4"  (1.626 m), weight 176 lb (79.8 kg), SpO2 98 %.  Physical Exam Physical Exam  Constitutional: She is oriented to person, place, and time. She appears well-developed and well-nourished.  Pulmonary/Chest:    Good shoulder range of motion.  Neurological: She is alert and oriented to person, place, and time.  Skin: Skin is warm and dry.  Psychiatric: Her behavior is normal.    Data Reviewed The  procedure for port removal was reviewed. She was amenable to proceed. Alcohol is applied to the skin followed by 10 mL of 0.5% Xylocaine with 0.25% Marcaine with 1-200,000 units of epinephrine. ChloraPrep was applied to the skin. The original incision was opened and the port exposed. Transfixion sutures removed. The port was extracted with an intact tip. No bleeding noted. The wound was closed with a running 3-0 Vicryl layer to the adipose tissue followed by a running 3-0 Vicryl subcuticular suture. Benzoin, Steri-Strips Telfa and Tegaderm dressing applied.  Assessment    Good tolerance of port removal procedure.    Plan          Follow up in 3 months with right screening mammogram.  HPI, Physical Exam, Assessment and Plan have been scribed under the direction and in the presence of Robert Bellow, MD. Karie Fetch, RN  I have completed the exam and reviewed the above documentation for accuracy and completeness.  I agree with the above.  Haematologist has been used and any errors in dictation or transcription are unintentional.  Hervey Ard, M.D., F.A.C.S.  Robert Bellow 09/21/2017, 8:48 PM

## 2017-09-22 ENCOUNTER — Telehealth: Payer: Self-pay | Admitting: *Deleted

## 2017-09-22 NOTE — Telephone Encounter (Signed)
Thanks- that sounds great  Please let pt know we are in the process for her Prolia - waiting to hear from ins

## 2017-09-22 NOTE — Telephone Encounter (Signed)
Information has been submitted to pts insurance for verification of benefits. Awaiting response for coverage  

## 2017-09-23 NOTE — Telephone Encounter (Signed)
mychart message sent to pt to let her know

## 2017-10-19 DIAGNOSIS — H269 Unspecified cataract: Secondary | ICD-10-CM

## 2017-10-19 HISTORY — DX: Unspecified cataract: H26.9

## 2017-10-20 NOTE — Progress Notes (Signed)
Office Visit Note  Patient: Samantha Valentine             Date of Birth: 1945/11/05           MRN: [id number]             PCP: Abner Greenspan, MD Referring: Tower, Wynelle Fanny, MD Visit Date: 11/03/2017 Occupation: @GUAROCC @    Subjective:  Pain in multiple joints    History of Present Illness: Samantha Valentine is a 72 y.o. female with history of seropositive rheumatoid arthritis, DDD, and fibromyalgia.  Patient states she has had 3 doses of her Enbrel since restarting it after chemotherapy.  She discontinued SSZ due to severe constipation and not noticing any benefit.  She has pain in multiple joints.  She states her left shoulder has been causing discomfort, especially at night.  She states she has been taking Celebrex daily sometimes twice daily when the pain is severe.  She is also having increased pain in her right hand and bilateral knees.  She denies any knee swelling.  She states she is having more fatigue and more generalized pain.  She ranks her pain a 7 out of 10 on a daily basis.  She states last night her left shoulder pain was a 9 out of 10.  She is also having increased joint stiffness, especially in the morning and after sitting for prolonged periods of time.she has been having right trochanteric bursitis and increased groin pain on the left side.   Her last DEXA was on 05/06/17.   She continues to take vitamin D.          Activities of Daily Living:  Patient reports morning stiffness for all day.   Patient Reports nocturnal pain.  Difficulty dressing/grooming: Denies Difficulty climbing stairs: Reports Difficulty getting out of chair: Reports Difficulty using hands for taps, buttons, cutlery, and/or writing: Reports   Review of Systems  Constitutional: Positive for fatigue. Negative for weakness.  HENT: Positive for mouth dryness. Negative for mouth sores and nose dryness.   Eyes: Negative for redness, visual disturbance and dryness.  Respiratory: Negative for cough,  hemoptysis, shortness of breath and difficulty breathing.   Cardiovascular: Negative for chest pain, palpitations, hypertension, irregular heartbeat and swelling in legs/feet.  Gastrointestinal: Negative for blood in stool, constipation and diarrhea.  Endocrine: Negative for increased urination.  Genitourinary: Negative for painful urination.  Musculoskeletal: Positive for arthralgias, joint pain and morning stiffness. Negative for joint swelling, myalgias, muscle weakness, muscle tenderness and myalgias.  Skin: Positive for nodules/bumps. Negative for color change, pallor, rash, hair loss, redness, skin tightness, ulcers and sensitivity to sunlight.  Neurological: Negative for dizziness, numbness and headaches.  Hematological: Negative for swollen glands.  Psychiatric/Behavioral: Positive for sleep disturbance. Negative for depressed mood. The patient is nervous/anxious (Ativan PRN).     PMFS History:  Patient Active Problem List   Diagnosis Date Noted  . Sleep disturbance 06/18/2017  . Tachycardia 05/25/2017  . Dysuria 05/25/2017  . Estrogen deficiency 03/22/2017  . Malignant neoplasm of upper-outer quadrant of left breast in female, estrogen receptor negative ([place]) 02/04/2017  . Tendinopathy of right shoulder 01/25/2017  . High risk medication use 09/29/2016  . DJD (degenerative joint disease), cervical 09/29/2016  . History of right hip replacement 09/29/2016  . Eczema 07/13/2014  . Adverse effect of immunosuppressive drug 04/27/2012  . ANXIETY DEPRESSION 10/28/2008  . Spinal stenosis 12/29/2007  . Hypothyroidism 12/28/2007  . Vitamin D deficiency 12/28/2007  . HEARING LOSS  12/28/2007  . Rheumatoid arthritis ([place]) 12/28/2007  . DDD (degenerative disc disease), lumbar 12/28/2007  . Fibromyalgia 12/28/2007  . Osteoporosis 12/28/2007  . MIGRAINES, HX OF 12/28/2007    Past Medical History:  Diagnosis Date  . Arthritis    RA  . Breast mass 1 year   . Cancer ([place]) 01/29/2017     left breast INVASIVE MAMMARY CARCINOMA, ER/PR positive  . Collagen vascular disease ([facility])    Rhematoid Arthritis  . DDD (degenerative disc disease)    in neck  . Depression   . Fibromyalgia   . GERD (gastroesophageal reflux disease)    NO MEDS  . History of kidney stones    MULTIPLE KIDNEY STONES BIL  . Hypothyroidism   . Migraine   . Osteoporosis   . Rheumatoid arthritis ([place])     Family History  Problem Relation Age of Onset  . Alcohol abuse Father   . Diabetes Father   . Stroke Father   . Hypertension Mother   . Endometrial cancer Mother   . Osteoarthritis Mother   . Breast cancer Paternal Aunt 66  . Liver disease Sister   . Breast cancer Maternal Aunt   . Rheum arthritis Maternal Grandmother   . Diabetes Paternal Grandmother   . Parkinson's disease Paternal Grandfather    Past Surgical History:  Procedure Laterality Date  . BREAST BIOPSY Left 01/29/2017   US guided biopsy INVASIVE MAMMARY CARCINOMA  . HIP ARTHROPLASTY    . JOINT REPLACEMENT Right 2003   total hip replacement  . LITHOTRIPSY  1997   kidney stone  . MASTECTOMY W/ SENTINEL NODE BIOPSY Left 05/18/2017   Procedure: MASTECTOMY WITH SENTINEL LYMPH NODE BIOPSY;  Surgeon: Robert Bellow, MD;  Location: [facility] ORS;  Service: General;  Laterality: Left;  . PORTACATH PLACEMENT Right 02/15/2017   Procedure: INSERTION PORT-A-CATH;  Surgeon: Robert Bellow, MD;  Location: [facility] ORS;  Service: General;  Laterality: Right;  . TONSILLECTOMY  1970   Social History   Social History Narrative  . Not on file     Objective: Vital Signs: BP 130/82 (BP Location: Right Arm, Patient Position: Sitting, Cuff Size: Normal)   Pulse 66   Resp 17   Ht 5\' 4"  (1.626 m)   Wt 180 lb (81.6 kg)   BMI 30.90 kg/m    Physical Exam  Constitutional: She is oriented to person, place, and time. She appears well-developed and well-nourished.  HENT:  Head: Normocephalic and atraumatic.  Eyes: Conjunctivae and EOM are  normal.  Neck: Normal range of motion.  Cardiovascular: Normal rate, regular rhythm, normal heart sounds and intact distal pulses.  Pulmonary/Chest: Effort normal and breath sounds normal.  Abdominal: Soft. Bowel sounds are normal.  Lymphadenopathy:    She has no cervical adenopathy.  Neurological: She is alert and oriented to person, place, and time.  Skin: Skin is warm and dry. Capillary refill takes less than 2 seconds.  Psychiatric: She has a normal mood and affect. Her behavior is normal.  Nursing note and vitals reviewed.    Musculoskeletal Exam: C-spine limited ROM. Mild thoracic kyphosis. No midline spinal tenderness.  No SI joint tenderness. Lumbar spine good ROM.  Left shoulder slightly limited forward flexion with discomfort.  Right shoulder full ROM.  Elbow joints good ROM.  Limited wrist ROM. MCPs, PIPs, and DIPs good ROM with no synovitis.  Synovial thickening of MCPs. Hip joints good ROM with some discomfort of left hip.  Right trochanteric bursitis.  Knee joints  full ROM.  No warmth or effusion.  Ankle joints, MTPs, PIPs, and DIPs good ROM with no synovitis.    CDAI Exam: CDAI Homunculus Exam:   Joint Counts:  CDAI Tender Joint count: 0 CDAI Swollen Joint count: 0  Global Assessments:  Patient Global Assessment: 7 Provider Global Assessment: 3  CDAI Calculated Score: 10    Investigation: No additional findings.TB Gold: 09/03/2017 Negative  CBC Latest Ref Rng & Units 07/22/2017 05/25/2017 04/14/2017  WBC 4.0 - 10.5 K/uL 5.8 6.8 4.6  Hemoglobin 12.0 - 15.0 g/dL 13.0 13.2 11.1(L)  Hematocrit 36.0 - 46.0 % 39.9 40.2 32.3(L)  Platelets 150.0 - 400.0 K/uL 205.0 241.0 323   CMP Latest Ref Rng & Units 07/22/2017 05/25/2017 04/14/2017  Glucose 70 - 99 mg/dL 97 97 97  BUN 6 - 23 mg/dL 22 17 13   Creatinine 0.40 - 1.20 mg/dL 0.80 0.76 0.47  Sodium 135 - 145 mEq/L 138 140 139  Potassium 3.5 - 5.1 mEq/L 3.5 4.0 3.5  Chloride 96 - 112 mEq/L 102 105 108  CO2 19 - 32 mEq/L 26 29  25   Calcium 8.4 - 10.5 mg/dL 9.2 9.2 8.6(L)  Total Protein 6.0 - 8.3 g/dL 6.9 6.9 6.3(L)  Total Bilirubin 0.2 - 1.2 mg/dL 0.3 0.3 0.4  Alkaline Phos 39 - 117 U/L 80 92 77  AST 0 - 37 U/L 13 14 26   ALT 0 - 35 U/L 9 11 15     Imaging: No results found.  Speciality Comments: No specialty comments available.    Procedures:  No procedures performed Allergies: Adhesive [tape]; Hydroxychloroquine sulfate; Ibandronate sodium; and Risedronate sodium   Assessment / Plan:     Visit Diagnoses: Seropositive rheumatoid arthritis of multiple sites ([place]) - Positive RF with erosive disease: No synovitis.  She has synovial thickening over MCPs. She continues to have joint stiffness and discomfort in multiple joints. She will continue on Enbrel.  She will be started on Cymbalta 30 mg daily and was given a refill of Celebrex to manage her pain.     High risk medication use - Enbrel, Discontinued SSZ due constipation-A CBC and CMP were drawn today. - Plan: CBC with Differential/Platelet, COMPLETE METABOLIC PANEL WITH GFR  History of right hip replacement: Good ROM on exam.  She has right trochanteric bursitis.    Fibromyalgia: She is having increased generalized pain and fatigue.  She also continues to have insomnia and nocturnal pain. She ranks her pain on average at a 7 out of 10. She was given a prescription for Cymbalta 30 mg daily.  The indications, contraindications, and side effects were discussed.  She was also given a refill of Celebrex 200 mg PRN.   DDD (degenerative disc disease), cervical: She has limited ROM with lateral rotation.  She reports stiffness and discomfort.    DDD (degenerative disc disease), lumbar: Chronic pain.  No midline spinal tenderness.   Osteopenia of multiple sites - DEXA scan on 05/06/17.  She takes vitamin D supplements.  She cannot tolerate taking Calcium supplements due to the side effect of constipation.    History of vitamin D deficiency - She is on vitamin D  1,000 units daily.    Other medical conditions are listed as follows:  History of hypothyroidism  History of migraine  History of anxiety: She takes Ativan PRN.  History of hearing loss  History of depression  Malignant neoplasm of upper-outer quadrant of left breast in female, estrogen receptor negative ([place]) - Patient recently stopped anastrozole due  to increase myalgias and arthralgias. She also stopped her chemotherapy.    Orders: Orders Placed This Encounter  Procedures  . CBC with Differential/Platelet  . COMPLETE METABOLIC PANEL WITH GFR   Meds ordered this encounter  Medications  . celecoxib (CELEBREX) 200 MG capsule    Sig: 1 capsule by mouth twice a day when necessary with meals    Dispense:  135 capsule    Refill:  0  . DULoxetine (CYMBALTA) 30 MG capsule    Sig: Take 1 capsule (30 mg total) by mouth daily.    Dispense:  30 capsule    Refill:  2    Face-to-face time spent with patient was 30 minutes. Greater than 50% of time was spent in counseling and coordination of care.  Follow-Up Instructions: Return for Rheumatoid arthritis, DDD, fibromyalgia .   Bo Merino, MD  Note - This record has been created using Editor, commissioning.  Chart creation errors have been sought, but may not always  have been located. Such creation errors do not reflect on  the standard of medical care.

## 2017-10-21 ENCOUNTER — Other Ambulatory Visit: Payer: Self-pay | Admitting: *Deleted

## 2017-10-21 MED ORDER — LEVOTHYROXINE SODIUM 125 MCG PO TABS: 125.0000 ug | ORAL_TABLET | Freq: Every day | ORAL | 5 refills | Status: DC

## 2017-11-03 ENCOUNTER — Encounter: Payer: Self-pay | Admitting: Rheumatology

## 2017-11-03 ENCOUNTER — Ambulatory Visit: Payer: PPO | Admitting: Rheumatology

## 2017-11-03 VITALS — BP 130/82 | HR 66 | Resp 17 | Ht 64.0 in | Wt 180.0 lb

## 2017-11-03 DIAGNOSIS — M5136 Other intervertebral disc degeneration, lumbar region: Secondary | ICD-10-CM

## 2017-11-03 DIAGNOSIS — Z171 Estrogen receptor negative status [ER-]: Secondary | ICD-10-CM | POA: Diagnosis not present

## 2017-11-03 DIAGNOSIS — Z8659 Personal history of other mental and behavioral disorders: Secondary | ICD-10-CM

## 2017-11-03 DIAGNOSIS — Z8639 Personal history of other endocrine, nutritional and metabolic disease: Secondary | ICD-10-CM | POA: Diagnosis not present

## 2017-11-03 DIAGNOSIS — M797 Fibromyalgia: Secondary | ICD-10-CM

## 2017-11-03 DIAGNOSIS — Z79899 Other long term (current) drug therapy: Secondary | ICD-10-CM | POA: Diagnosis not present

## 2017-11-03 DIAGNOSIS — Z8669 Personal history of other diseases of the nervous system and sense organs: Secondary | ICD-10-CM | POA: Diagnosis not present

## 2017-11-03 DIAGNOSIS — M8589 Other specified disorders of bone density and structure, multiple sites: Secondary | ICD-10-CM

## 2017-11-03 DIAGNOSIS — C50412 Malignant neoplasm of upper-outer quadrant of left female breast: Secondary | ICD-10-CM

## 2017-11-03 DIAGNOSIS — Z96641 Presence of right artificial hip joint: Secondary | ICD-10-CM | POA: Diagnosis not present

## 2017-11-03 DIAGNOSIS — M5030 Other cervical disc degeneration, unspecified cervical region: Secondary | ICD-10-CM | POA: Diagnosis not present

## 2017-11-03 DIAGNOSIS — M0579 Rheumatoid arthritis with rheumatoid factor of multiple sites without organ or systems involvement: Secondary | ICD-10-CM | POA: Diagnosis not present

## 2017-11-03 MED ORDER — DULOXETINE HCL 30 MG PO CPEP: 30.0000 mg | ORAL_CAPSULE | Freq: Every day | ORAL | 2 refills | Status: DC

## 2017-11-03 MED ORDER — CELECOXIB 200 MG PO CAPS: ORAL_CAPSULE | ORAL | 0 refills | Status: DC

## 2017-11-03 NOTE — Patient Instructions (Signed)
Standing Labs We placed an order today for your standing lab work.    Please come back and get your standing labs in April and every 3 months  We have open lab Monday through Friday from 8:30-11:30 AM and 1:30-4 PM at the office of Dr. Shaili Deveshwar.   The office is located at [address], [address], [place], [place] [address] No appointment is necessary.   Labs are drawn by [facility].  You may receive a bill from [facility] for your lab work. If you have any questions regarding directions or hours of operation,  please call [phone].    

## 2017-11-04 LAB — CBC WITH DIFFERENTIAL/PLATELET
Basophils Absolute: 21 cells/uL (ref 0–200)
Basophils Relative: 0.3 %
EOS ABS: 133 {cells}/uL (ref 15–500)
Eosinophils Relative: 1.9 %
HCT: 41.9 % (ref 35.0–45.0)
Hemoglobin: 14.1 g/dL (ref 11.7–15.5)
LYMPHS ABS: 1358 {cells}/uL (ref 850–3900)
MCH: 30.8 pg (ref 27.0–33.0)
MCHC: 33.7 g/dL (ref 32.0–36.0)
MCV: 91.5 fL (ref 80.0–100.0)
MPV: 11.4 fL (ref 7.5–12.5)
Monocytes Relative: 9.9 %
NEUTROS ABS: 4795 {cells}/uL (ref 1500–7800)
Neutrophils Relative %: 68.5 %
PLATELETS: 206 10*3/uL (ref 140–400)
RBC: 4.58 10*6/uL (ref 3.80–5.10)
RDW: 11.8 % (ref 11.0–15.0)
TOTAL LYMPHOCYTE: 19.4 %
WBC: 7 10*3/uL (ref 3.8–10.8)
WBCMIX: 693 {cells}/uL (ref 200–950)

## 2017-11-04 LAB — COMPLETE METABOLIC PANEL WITH GFR
AG RATIO: 1.5 (calc) (ref 1.0–2.5)
ALKALINE PHOSPHATASE (APISO): 89 U/L (ref 33–130)
ALT: 11 U/L (ref 6–29)
AST: 13 U/L (ref 10–35)
Albumin: 3.9 g/dL (ref 3.6–5.1)
BILIRUBIN TOTAL: 0.5 mg/dL (ref 0.2–1.2)
BUN: 20 mg/dL (ref 7–25)
CHLORIDE: 102 mmol/L (ref 98–110)
CO2: 28 mmol/L (ref 20–32)
Calcium: 9.1 mg/dL (ref 8.6–10.4)
Creat: 0.79 mg/dL (ref 0.50–0.99)
GFR, Est African American: 89 mL/min/{1.73_m2} (ref >=60)
GFR, Est Non African American: 76 mL/min/{1.73_m2} (ref >=60)
Globulin: 2.6 g/dL (calc) (ref 1.9–3.7)
Glucose, Bld: 93 mg/dL (ref 65–99)
POTASSIUM: 4.5 mmol/L (ref 3.5–5.3)
Sodium: 139 mmol/L (ref 135–146)
Total Protein: 6.5 g/dL (ref 6.1–8.1)

## 2017-11-04 NOTE — Progress Notes (Signed)
All labs are WNL

## 2017-11-15 ENCOUNTER — Telehealth: Payer: Self-pay

## 2017-11-15 NOTE — Telephone Encounter (Signed)
Please set up lab for TSH for hypothyroidism non fasting  Thanks

## 2017-11-15 NOTE — Telephone Encounter (Signed)
Lab appt scheduled.

## 2017-11-15 NOTE — Telephone Encounter (Signed)
Copied from [place] [id number]. Topic: Inquiry >> Nov 15, 2017 11:09 AM Patrice Paradise wrote: Reason for CRM: Patient is requesting lab orders for TSH. Please call patient to schedule appt once the order has been put in she can be reached @ [phone].

## 2017-11-15 NOTE — Telephone Encounter (Signed)
Pt last seen for f/u 06/18/17; no future appt scheduled. Last TSH was 2.75 on 09/03/17; this was ordered by Dr Estanislado Pandy.Please advise.

## 2017-11-17 ENCOUNTER — Other Ambulatory Visit: Payer: Self-pay

## 2017-11-17 DIAGNOSIS — Z1231 Encounter for screening mammogram for malignant neoplasm of breast: Secondary | ICD-10-CM

## 2017-11-22 ENCOUNTER — Encounter: Payer: Self-pay | Admitting: *Deleted

## 2017-11-26 NOTE — Telephone Encounter (Signed)
Spoke to pt who agrees to out of pocket costs. prolia inj scheduled for 2/14

## 2017-11-26 NOTE — Telephone Encounter (Signed)
Verification of benefits have been processed and an approval has been received for pts prolia injection. Pts estimated cost are appx $225. This is only an estimate and cannot be confirmed until benefits are paid. Please advise pt and schedule if needed. If scheduled, once the injection is received, pls contact me back with the date it was received so that I am able to update prolia folder. thanks

## 2017-11-29 ENCOUNTER — Encounter: Payer: Self-pay | Admitting: Rheumatology

## 2017-11-29 NOTE — Telephone Encounter (Signed)
Last visit: 11/03/2017 Next visit: 01/25/2018 Labs: 11/03/2017 WNL  TB Gold: 09/03/2018 Negative   Okay to refill per Lovena Le.  Prescription refill request has been faxed to [facility].

## 2017-12-01 ENCOUNTER — Ambulatory Visit: Payer: PPO | Admitting: Oncology

## 2017-12-02 ENCOUNTER — Telehealth (INDEPENDENT_AMBULATORY_CARE_PROVIDER_SITE_OTHER): Payer: PPO | Admitting: Family Medicine

## 2017-12-02 ENCOUNTER — Ambulatory Visit (INDEPENDENT_AMBULATORY_CARE_PROVIDER_SITE_OTHER): Payer: PPO | Admitting: *Deleted

## 2017-12-02 ENCOUNTER — Other Ambulatory Visit (INDEPENDENT_AMBULATORY_CARE_PROVIDER_SITE_OTHER): Payer: PPO

## 2017-12-02 DIAGNOSIS — E039 Hypothyroidism, unspecified: Secondary | ICD-10-CM

## 2017-12-02 DIAGNOSIS — M810 Age-related osteoporosis without current pathological fracture: Secondary | ICD-10-CM | POA: Diagnosis not present

## 2017-12-02 LAB — TSH: TSH: 2.25 u[IU]/mL (ref 0.35–4.50)

## 2017-12-02 MED ORDER — DENOSUMAB 60 MG/ML ~~LOC~~ SOLN
60.0000 mg | Freq: Once | SUBCUTANEOUS | Status: AC
  Administered 2017-12-02: 60 mg via SUBCUTANEOUS

## 2017-12-02 NOTE — Telephone Encounter (Signed)
I think it is just TSH

## 2017-12-02 NOTE — Telephone Encounter (Signed)
-----   Message from Ellamae Sia sent at 12/02/2017 10:14 AM EST ----- Regarding: Lab orders for now Morning, Pt walked in a week early for labs, TSH ? Thanks, T

## 2017-12-08 ENCOUNTER — Other Ambulatory Visit: Payer: PPO

## 2017-12-15 NOTE — Progress Notes (Signed)
Chart reviewed. Prolia injection given. Webb Silversmith, NP

## 2017-12-17 ENCOUNTER — Encounter: Payer: Self-pay | Admitting: Rheumatology

## 2018-01-12 NOTE — Progress Notes (Signed)
Office Visit Note  Patient: Samantha Valentine             Date of Birth: 04/13/1946           MRN: [id number]             PCP: Abner Greenspan, MD Referring: Tower, Wynelle Fanny, MD Visit Date: 01/25/2018 Occupation: @GUAROCC @    Subjective:  Medication management.   History of Present Illness: Samantha Valentine is a 72 y.o. female history of rheumatoid arthritis and degenerative disc disease.  She resumed Enbrel few months back.  She denies any joint swelling.  Although she continues to have discomfort in some joints.  She describes discomfort in her right third PIP joint.  She has discomfort in her lower back, bilateral knee joints and bilateral ankles.  She started Cymbalta which has helped her with mood stabilization and discomfort.  Activities of Daily Living:  Patient reports morning stiffness for 30 minutes.   Patient Reports nocturnal pain.  Difficulty dressing/grooming: Denies Difficulty climbing stairs: Reports Difficulty getting out of chair: Reports Difficulty using hands for taps, buttons, cutlery, and/or writing: Reports   Review of Systems  Constitutional: Positive for fatigue. Negative for night sweats, weight gain and weight loss.  HENT: Positive for mouth dryness. Negative for mouth sores, trouble swallowing, trouble swallowing and nose dryness.   Eyes: Negative for pain, redness, visual disturbance and dryness.  Respiratory: Negative for cough, shortness of breath and difficulty breathing.   Cardiovascular: Negative for chest pain, palpitations, hypertension, irregular heartbeat and swelling in legs/feet.  Gastrointestinal: Negative for blood in stool, constipation and diarrhea.  Endocrine: Negative for increased urination.  Genitourinary: Negative for vaginal dryness.  Musculoskeletal: Positive for arthralgias, joint pain and morning stiffness. Negative for joint swelling, myalgias, muscle weakness, muscle tenderness and myalgias.  Skin: Negative for color  change, rash, hair loss, skin tightness, ulcers and sensitivity to sunlight.  Allergic/Immunologic: Negative for susceptible to infections.  Neurological: Negative for dizziness, memory loss, night sweats and weakness.  Hematological: Negative for swollen glands.  Psychiatric/Behavioral: Negative for depressed mood and sleep disturbance. The patient is nervous/anxious.     PMFS History:  Patient Active Problem List   Diagnosis Date Noted  . Sleep disturbance 06/18/2017  . Tachycardia 05/25/2017  . Dysuria 05/25/2017  . Estrogen deficiency 03/22/2017  . Malignant neoplasm of upper-outer quadrant of left breast in female, estrogen receptor negative ([place]) 02/04/2017  . Tendinopathy of right shoulder 01/25/2017  . High risk medication use 09/29/2016  . DJD (degenerative joint disease), cervical 09/29/2016  . History of right hip replacement 09/29/2016  . Eczema 07/13/2014  . Adverse effect of immunosuppressive drug 04/27/2012  . ANXIETY DEPRESSION 10/28/2008  . Spinal stenosis 12/29/2007  . Hypothyroidism 12/28/2007  . Vitamin D deficiency 12/28/2007  . HEARING LOSS 12/28/2007  . Rheumatoid arthritis ([place]) 12/28/2007  . DDD (degenerative disc disease), lumbar 12/28/2007  . Fibromyalgia 12/28/2007  . Osteoporosis 12/28/2007  . MIGRAINES, HX OF 12/28/2007    Past Medical History:  Diagnosis Date  . Arthritis    RA  . Breast cancer ([place]) 01/29/2017  . Breast mass 1 year   . Cancer ([place]) 01/29/2017   left breast INVASIVE MAMMARY CARCINOMA, ER/PR positive  . Collagen vascular disease ([facility])    Rhematoid Arthritis  . DDD (degenerative disc disease)    in neck  . Depression   . Fibromyalgia   . GERD (gastroesophageal reflux disease)    NO MEDS  . History of  kidney stones    MULTIPLE KIDNEY STONES BIL  . Hypothyroidism   . Migraine   . Osteoporosis   . Personal history of chemotherapy    prior to mastectomy  . Rheumatoid arthritis ([place])     Family History  Problem  Relation Age of Onset  . Alcohol abuse Father   . Diabetes Father   . Stroke Father   . Hypertension Mother   . Endometrial cancer Mother   . Osteoarthritis Mother   . Breast cancer Paternal Aunt 20  . Liver disease Sister   . Breast cancer Maternal Aunt   . Rheum arthritis Maternal Grandmother   . Diabetes Paternal Grandmother   . Parkinson's disease Paternal Grandfather    Past Surgical History:  Procedure Laterality Date  . BREAST BIOPSY Left 01/29/2017   US guided biopsy INVASIVE MAMMARY CARCINOMA  . HIP ARTHROPLASTY    . JOINT REPLACEMENT Right 2003   total hip replacement  . LITHOTRIPSY  1997   kidney stone  . MASTECTOMY Left 01/29/2017  . MASTECTOMY W/ SENTINEL NODE BIOPSY Left 05/18/2017   Procedure: MASTECTOMY WITH SENTINEL LYMPH NODE BIOPSY;  Surgeon: Robert Bellow, MD;  Location: [facility] ORS;  Service: General;  Laterality: Left;  . PORTACATH PLACEMENT Right 02/15/2017   Procedure: INSERTION PORT-A-CATH;  Surgeon: Robert Bellow, MD;  Location: [facility] ORS;  Service: General;  Laterality: Right;  . TONSILLECTOMY  1970   Social History   Social History Narrative  . Not on file     Objective: Vital Signs: BP (!) 143/84 (BP Location: Right Arm, Patient Position: Sitting, Cuff Size: Normal)   Pulse 91   Resp 16   Ht 5\' 4"  (1.626 m)   Wt 168 lb (76.2 kg)   BMI 28.84 kg/m    Physical Exam  Constitutional: She is oriented to person, place, and time. She appears well-developed and well-nourished.  HENT:  Head: Normocephalic and atraumatic.  Eyes: Conjunctivae and EOM are normal.  Neck: Normal range of motion.  Cardiovascular: Normal rate, regular rhythm, normal heart sounds and intact distal pulses.  Pulmonary/Chest: Effort normal and breath sounds normal.  Abdominal: Soft. Bowel sounds are normal.  Lymphadenopathy:    She has no cervical adenopathy.  Neurological: She is alert and oriented to person, place, and time.  Skin: Skin is warm and dry. Capillary  refill takes less than 2 seconds.  Psychiatric: She has a normal mood and affect. Her behavior is normal.  Nursing note and vitals reviewed.    Musculoskeletal Exam: C-spine thoracic lumbar spine good range of motion.  She has good range of motion of her shoulders elbows and wrist joints.  She has some synovial thickening but no synovitis over her wrist joints.  She has right third PIP thickening but no synovitis was noted.  She also has thickening of bilateral CMC joints.  She has good range of motion of her hip joints her right hip has been replaced.  She is crepitus in her knee joints without any warmth swelling or effusion.  She has no tenderness across the MTP joints.  CDAI Exam: CDAI Homunculus Exam:   Tenderness:  Right hand: 3rd PIP RLE: tibiofemoral and tibiotalar LLE: tibiofemoral and tibiotalar  Joint Counts:  CDAI Tender Joint count: 3 CDAI Swollen Joint count: 0  Global Assessments:  Patient Global Assessment: 5 Provider Global Assessment: 2  CDAI Calculated Score: 10    Investigation: No additional findings.TB Gold: 09/03/2017 Negative  CBC Latest Ref Rng & Units 11/03/2017  07/22/2017 05/25/2017  WBC 3.8 - 10.8 Thousand/uL 7.0 5.8 6.8  Hemoglobin 11.7 - 15.5 g/dL 14.1 13.0 13.2  Hematocrit 35.0 - 45.0 % 41.9 39.9 40.2  Platelets 140 - 400 Thousand/uL 206 205.0 241.0   CMP Latest Ref Rng & Units 11/03/2017 07/22/2017 05/25/2017  Glucose 65 - 99 mg/dL 93 97 97  BUN 7 - 25 mg/dL 20 22 17   Creatinine 0.50 - 0.99 mg/dL 0.79 0.80 0.76  Sodium 135 - 146 mmol/L 139 138 140  Potassium 3.5 - 5.3 mmol/L 4.5 3.5 4.0  Chloride 98 - 110 mmol/L 102 102 105  CO2 20 - 32 mmol/L 28 26 29   Calcium 8.6 - 10.4 mg/dL 9.1 9.2 9.2  Total Protein 6.1 - 8.1 g/dL 6.5 6.9 6.9  Total Bilirubin 0.2 - 1.2 mg/dL 0.5 0.3 0.3  Alkaline Phos 39 - 117 U/L - 80 92  AST 10 - 35 U/L 13 13 14   ALT 6 - 29 U/L 11 9 11     Imaging: Mm Digital Screening Unilat R  Result Date: 01/13/2018 CLINICAL DATA:   Screening. EXAM: DIGITAL SCREENING UNILATERAL RIGHT MAMMOGRAM WITH CAD COMPARISON:  Previous exam(s). ACR Breast Density Category c: The breast tissue is heterogeneously dense, which may obscure small masses. FINDINGS: The patient has had a left mastectomy. There are no findings suspicious for malignancy. Images were processed with CAD. IMPRESSION: No mammographic evidence of malignancy. A result letter of this screening mammogram will be mailed directly to the patient. RECOMMENDATION: Screening mammogram in one year.  (Code:SM-R-22M) BI-RADS CATEGORY  1: Negative. Electronically Signed   By: Ammie Ferrier M.D.   On: 01/13/2018 11:35    Speciality Comments: No specialty comments available.    Procedures:  No procedures performed Allergies: Adhesive [tape]; Hydroxychloroquine sulfate; Ibandronate sodium; and Risedronate sodium   Assessment / Plan:     Visit Diagnoses: Seropositive rheumatoid arthritis of multiple sites ([place]) - Positive RF with erosive disease.  Patient is clinically doing very well on Enbrel.  She has no active synovitis.  She continues to have arthralgias due to underlying osteoarthritis.  Osteoarthritis bilateral hands: She continues to have some discomfort in her PIP and CMC joints.  High risk medication use - Enbrel 50 mg sq q wk, Discontinued SSZ due constipation.  Her labs have been stable.  I will check her labs today and then every 3 months.  Fibromyalgia: She continues to have some generalized pain from fibromyalgia.  Her symptoms have improved since she has been taking Cymbalta.  We will refill her Cymbalta today.  History of right hip replacement  DDD (degenerative disc disease), cervical  DDD (degenerative disc disease), lumbar  Age-related osteoporosis without current pathological fracture - DEXA scan on 05/06/17 showed T score of -2.4.  She has been treated with Forteo in the past.  She is currently on Prolia which was a started by Dr. Glori Bickers.  She has had  only one injection so far.  Malignant neoplasm of upper-outer quadrant of left breast in female, estrogen receptor negative ([place]) - Patient recently stopped anastrozole due to increase myalgias and arthralgias. She also stopped her chemotherapy.   History of depression: Cymbalta has improved her mood as well.  History of vitamin D deficiency: She is on vitamin D supplements.  History of hearing loss  History of migraine  History of hypothyroidism  History of anxiety - She takes Ativan PRN.    Orders: No orders of the defined types were placed in this encounter.  Meds ordered  this encounter  Medications  . DULoxetine (CYMBALTA) 30 MG capsule    Sig: Take 1 capsule (30 mg total) by mouth daily.    Dispense:  90 capsule    Refill:  0    Face-to-face time spent with patient was 20 minutes.  Greater than 50% of time was spent in counseling and coordination of care.  Follow-Up Instructions: Return in about 5 months (around 06/27/2018) for Rheumatoid arthritis, Osteoarthritis.   Bo Merino, MD  Note - This record has been created using Editor, commissioning.  Chart creation errors have been sought, but may not always  have been located. Such creation errors do not reflect on  the standard of medical care.

## 2018-01-13 ENCOUNTER — Ambulatory Visit
Admission: RE | Admit: 2018-01-13 | Discharge: 2018-01-13 | Disposition: A | Discharge status: Home / Self Care | Payer: PPO | Source: Ambulatory Visit | Attending: General Surgery | Admitting: General Surgery

## 2018-01-13 DIAGNOSIS — Z1231 Encounter for screening mammogram for malignant neoplasm of breast: Secondary | ICD-10-CM | POA: Insufficient documentation

## 2018-01-13 HISTORY — DX: Personal history of antineoplastic chemotherapy: Z92.21

## 2018-01-13 HISTORY — DX: Malignant neoplasm of unspecified site of unspecified female breast: C50.919

## 2018-01-25 ENCOUNTER — Ambulatory Visit (INDEPENDENT_AMBULATORY_CARE_PROVIDER_SITE_OTHER): Payer: PPO | Admitting: General Surgery

## 2018-01-25 ENCOUNTER — Encounter: Payer: Self-pay | Admitting: General Surgery

## 2018-01-25 ENCOUNTER — Encounter: Payer: Self-pay | Admitting: Rheumatology

## 2018-01-25 ENCOUNTER — Ambulatory Visit: Payer: PPO | Admitting: Rheumatology

## 2018-01-25 VITALS — BP 130/80 | HR 68 | Resp 14 | Ht 64.0 in | Wt 167.0 lb

## 2018-01-25 VITALS — BP 143/84 | HR 91 | Resp 16 | Ht 64.0 in | Wt 168.0 lb

## 2018-01-25 DIAGNOSIS — Z96641 Presence of right artificial hip joint: Secondary | ICD-10-CM

## 2018-01-25 DIAGNOSIS — M19041 Primary osteoarthritis, right hand: Secondary | ICD-10-CM | POA: Diagnosis not present

## 2018-01-25 DIAGNOSIS — Z171 Estrogen receptor negative status [ER-]: Secondary | ICD-10-CM

## 2018-01-25 DIAGNOSIS — M797 Fibromyalgia: Secondary | ICD-10-CM

## 2018-01-25 DIAGNOSIS — M5030 Other cervical disc degeneration, unspecified cervical region: Secondary | ICD-10-CM

## 2018-01-25 DIAGNOSIS — C50412 Malignant neoplasm of upper-outer quadrant of left female breast: Secondary | ICD-10-CM

## 2018-01-25 DIAGNOSIS — Z8639 Personal history of other endocrine, nutritional and metabolic disease: Secondary | ICD-10-CM

## 2018-01-25 DIAGNOSIS — M810 Age-related osteoporosis without current pathological fracture: Secondary | ICD-10-CM

## 2018-01-25 DIAGNOSIS — Z8669 Personal history of other diseases of the nervous system and sense organs: Secondary | ICD-10-CM | POA: Diagnosis not present

## 2018-01-25 DIAGNOSIS — M5136 Other intervertebral disc degeneration, lumbar region: Secondary | ICD-10-CM | POA: Diagnosis not present

## 2018-01-25 DIAGNOSIS — Z8659 Personal history of other mental and behavioral disorders: Secondary | ICD-10-CM | POA: Diagnosis not present

## 2018-01-25 DIAGNOSIS — M0579 Rheumatoid arthritis with rheumatoid factor of multiple sites without organ or systems involvement: Secondary | ICD-10-CM

## 2018-01-25 DIAGNOSIS — M51369 Other intervertebral disc degeneration, lumbar region without mention of lumbar back pain or lower extremity pain: Secondary | ICD-10-CM

## 2018-01-25 DIAGNOSIS — M19042 Primary osteoarthritis, left hand: Secondary | ICD-10-CM

## 2018-01-25 DIAGNOSIS — Z79899 Other long term (current) drug therapy: Secondary | ICD-10-CM | POA: Diagnosis not present

## 2018-01-25 LAB — CBC WITH DIFFERENTIAL/PLATELET
BASOS PCT: 0.4 %
Basophils Absolute: 40 cells/uL (ref 0–200)
EOS ABS: 119 {cells}/uL (ref 15–500)
Eosinophils Relative: 1.2 %
HEMATOCRIT: 42.3 % (ref 35.0–45.0)
HEMOGLOBIN: 14.3 g/dL (ref 11.7–15.5)
LYMPHS ABS: 1841 {cells}/uL (ref 850–3900)
MCH: 30.3 pg (ref 27.0–33.0)
MCHC: 33.8 g/dL (ref 32.0–36.0)
MCV: 89.6 fL (ref 80.0–100.0)
MONOS PCT: 8.1 %
MPV: 11.9 fL (ref 7.5–12.5)
NEUTROS ABS: 7098 {cells}/uL (ref 1500–7800)
Neutrophils Relative %: 71.7 %
Platelets: 179 10*3/uL (ref 140–400)
RBC: 4.72 10*6/uL (ref 3.80–5.10)
RDW: 13.1 % (ref 11.0–15.0)
Total Lymphocyte: 18.6 %
WBC: 9.9 10*3/uL (ref 3.8–10.8)
WBCMIX: 802 {cells}/uL (ref 200–950)

## 2018-01-25 LAB — COMPLETE METABOLIC PANEL WITH GFR
AG Ratio: 1.8 (calc) (ref 1.0–2.5)
ALBUMIN MSPROF: 4 g/dL (ref 3.6–5.1)
ALT: 14 U/L (ref 6–29)
AST: 18 U/L (ref 10–35)
Alkaline phosphatase (APISO): 75 U/L (ref 33–130)
BILIRUBIN TOTAL: 0.4 mg/dL (ref 0.2–1.2)
BUN: 14 mg/dL (ref 7–25)
CO2: 30 mmol/L (ref 20–32)
CREATININE: 0.73 mg/dL (ref 0.60–0.93)
Calcium: 8.8 mg/dL (ref 8.6–10.4)
Chloride: 103 mmol/L (ref 98–110)
GFR, EST AFRICAN AMERICAN: 97 mL/min/{1.73_m2} (ref >=60)
GFR, Est Non African American: 83 mL/min/{1.73_m2} (ref >=60)
GLUCOSE: 95 mg/dL (ref 65–99)
Globulin: 2.2 g/dL (calc) (ref 1.9–3.7)
Potassium: 4 mmol/L (ref 3.5–5.3)
Sodium: 140 mmol/L (ref 135–146)
TOTAL PROTEIN: 6.2 g/dL (ref 6.1–8.1)

## 2018-01-25 MED ORDER — DULOXETINE HCL 30 MG PO CPEP: 30.0000 mg | ORAL_CAPSULE | Freq: Every day | ORAL | 0 refills | Status: DC

## 2018-01-25 NOTE — Patient Instructions (Signed)
Standing Labs We placed an order today for your standing lab work.    Please come back and get your standing labs in July and every 3 months   We have open lab Monday through Friday from 8:30-11:30 AM and 1:30-4:00 PM  at the office of Dr. Neveah Bang.   You may experience shorter wait times on Monday and Friday afternoons. The office is located at [address], [address], [place], [place] [address] No appointment is necessary.   Labs are drawn by [facility].  You may receive a bill from [facility] for your lab work. If you have any questions regarding directions or hours of operation,  please call [phone].    

## 2018-01-25 NOTE — Progress Notes (Signed)
Patient ID: Samantha Valentine, female   DOB: August 25, 1946, 72 y.o.   MRN: [id number]  Chief Complaint  Patient presents with  . Follow-up    HPI Samantha Valentine is a 72 y.o. female who presents for a breast evaluation. The most recent right breast mammogram was done on 01/13/2018. Bone density was done on 05/06/2017. Patient does perform regular self breast checks and gets regular mammograms done.    HPI  Past Medical History:  Diagnosis Date  . Arthritis    RA  . Breast cancer ([place]) 01/29/2017  . Breast mass 1 year   . Cancer ([place]) 01/29/2017   left breast INVASIVE MAMMARY CARCINOMA, ER/PR positive  . Collagen vascular disease ([facility])    Rhematoid Arthritis  . DDD (degenerative disc disease)    in neck  . Depression   . Fibromyalgia   . GERD (gastroesophageal reflux disease)    NO MEDS  . History of kidney stones    MULTIPLE KIDNEY STONES BIL  . Hypothyroidism   . Migraine   . Osteoporosis   . Personal history of chemotherapy    prior to mastectomy  . Rheumatoid arthritis [facility])     Past Surgical History:  Procedure Laterality Date  . BREAST BIOPSY Left 01/29/2017   US guided biopsy INVASIVE MAMMARY CARCINOMA  . HIP ARTHROPLASTY    . JOINT REPLACEMENT Right 2003   total hip replacement  . LITHOTRIPSY  1997   kidney stone  . MASTECTOMY Left 01/29/2017  . MASTECTOMY W/ SENTINEL NODE BIOPSY Left 05/18/2017   Procedure: MASTECTOMY WITH SENTINEL LYMPH NODE BIOPSY;  Surgeon: Robert Bellow, MD;  Location: [facility] ORS;  Service: General;  Laterality: Left;  . PORTACATH PLACEMENT Right 02/15/2017   Procedure: INSERTION PORT-A-CATH;  Surgeon: Robert Bellow, MD;  Location: [facility] ORS;  Service: General;  Laterality: Right;  . TONSILLECTOMY  1970    Family History  Problem Relation Age of Onset  . Alcohol abuse Father   . Diabetes Father   . Stroke Father   . Hypertension Mother   . Endometrial cancer Mother   . Osteoarthritis Mother   . Breast cancer Paternal Aunt  31  . Liver disease Sister   . Breast cancer Maternal Aunt   . Rheum arthritis Maternal Grandmother   . Diabetes Paternal Grandmother   . Parkinson's disease Paternal Grandfather     Social History Social History   Tobacco Use  . Smoking status: Former Smoker    Packs/day: 1.50    Years: 25.00    Pack years: 37.50    Types: Cigarettes    Last attempt to quit: 10/20/1983    Years since quitting: 34.2  . Smokeless tobacco: Never Used  Substance Use Topics  . Alcohol use: No  . Drug use: No    Allergies  Allergen Reactions  . Adhesive [Tape] Other (See Comments)    Skin blistered (PAPER TAPE OK)  . Hydroxychloroquine Sulfate Rash  . Ibandronate Sodium Rash  . Risedronate Sodium Rash    Current Outpatient Medications  Medication Sig Dispense Refill  . aspirin EC 81 MG tablet Take 81 mg by mouth daily.    . celecoxib (CELEBREX) 200 MG capsule 1 capsule by mouth twice a day when necessary with meals 135 capsule 0  . cholecalciferol (VITAMIN D) 1000 units tablet Take 1,000 Units by mouth daily.    Marland Kitchen etanercept (ENBREL) 50 MG/ML injection Inject 50 mg into the skin once a week.    . levothyroxine (  SYNTHROID, LEVOTHROID) 125 MCG tablet Take 1 tablet (125 mcg total) by mouth daily. 30 tablet 5  . LORazepam (ATIVAN) 1 MG tablet Take 1 tablet (1 mg total) by mouth at bedtime as needed for anxiety. 30 tablet 1  . DULoxetine (CYMBALTA) 30 MG capsule Take 1 capsule (30 mg total) by mouth daily. 90 capsule 0   No current facility-administered medications for this visit.     Review of Systems Review of Systems  Constitutional: Negative.   Respiratory: Negative.   Cardiovascular: Negative.     Blood pressure 130/80, pulse 68, resp. rate 14, height 5\' 4"  (1.626 m), weight 167 lb (75.8 kg).  Physical Exam Physical Exam  Constitutional: She is oriented to person, place, and time. She appears well-developed and well-nourished.  Eyes: Conjunctivae are normal. No scleral icterus.   Neck: Neck supple.  Cardiovascular: Normal rate, regular rhythm and normal heart sounds.  Pulmonary/Chest: Effort normal and breath sounds normal. Right breast exhibits no inverted nipple, no mass, no nipple discharge, no skin change and no tenderness.    Left mastectomy site is well healed.   Lymphadenopathy:    She has no cervical adenopathy.  Neurological: She is alert and oriented to person, place, and time.  Skin: Skin is warm and dry.    Data Reviewed January 13, 2018 right breast screening mammogram: BI-RADS-1.  Assessment    No evidence of recurrence of her left breast cancer.    Plan  Medication list reviewed after the patient left showed that it did not include anastrozole which was started by medical oncology at the time of her August 25, 2017 visit.  A message has been left on her home phone to call back to clarify if she is taking this medication.   (She had reported significant joint aches with letrozole prompting the change).  The patient has been asked to return to the office in one year with a right diagnostic mammogram. The patient is aware to call back for any questions or concerns.   HPI, Physical Exam, Assessment and Plan have been scribed under the direction and in the presence of Hervey Ard, MD.  Gaspar Cola, CMA  [facility] Alois Colgan 01/26/2018, 5:23 PM

## 2018-01-25 NOTE — Patient Instructions (Signed)
The patient has been asked to return to the office in one year with a right diagnostic mammogram. The patient is aware to call back for any questions or concerns.

## 2018-01-26 ENCOUNTER — Encounter: Payer: Self-pay | Admitting: General Surgery

## 2018-01-31 ENCOUNTER — Telehealth: Payer: Self-pay | Admitting: General Surgery

## 2018-01-31 NOTE — Telephone Encounter (Signed)
I spoke with the patient regarding the fact that she is not taking any antiestrogen.  She reports that when she saw Dr. Grayland Ormond in November he was going to send in a prescription for anastrozole, as she had unacceptable joint and muscle side effects with letrozole.  By her report, this did not happen and she took it as a "sign" that she should just not take any antiestrogen.  We briefly reviewed by phone the reasons for antiestrogen therapy, and the consideration of old line tamoxifen which should have no joint effects but certainly would help with her osteoporosis, recognizing it does have a risk of blood clots.  At this time, as she had significant vasomotor symptoms with an aromatase inhibitor, I would anticipate she to have vasomotor symptoms with tamoxifen and she just does not feel she has it in her to take new side effects on at this time (presently trying to pass kidney stones).  She is aware of the indications for antiestrogen therapy and this time declines.  We will plan for follow-up next spring as originally scheduled.

## 2018-03-07 DIAGNOSIS — H353132 Nonexudative age-related macular degeneration, bilateral, intermediate dry stage: Secondary | ICD-10-CM | POA: Diagnosis not present

## 2018-03-09 ENCOUNTER — Encounter: Payer: Self-pay | Admitting: Rheumatology

## 2018-03-11 ENCOUNTER — Ambulatory Visit (INDEPENDENT_AMBULATORY_CARE_PROVIDER_SITE_OTHER): Payer: PPO | Admitting: Family Medicine

## 2018-03-11 ENCOUNTER — Encounter: Payer: Self-pay | Admitting: Family Medicine

## 2018-03-11 VITALS — BP 118/68 | HR 90 | Temp 97.5°F | Ht 64.0 in | Wt 164.5 lb

## 2018-03-11 DIAGNOSIS — N390 Urinary tract infection, site not specified: Secondary | ICD-10-CM | POA: Insufficient documentation

## 2018-03-11 DIAGNOSIS — R350 Frequency of micturition: Secondary | ICD-10-CM | POA: Diagnosis not present

## 2018-03-11 DIAGNOSIS — N3000 Acute cystitis without hematuria: Secondary | ICD-10-CM | POA: Diagnosis not present

## 2018-03-11 DIAGNOSIS — I700 Atherosclerosis of aorta: Secondary | ICD-10-CM

## 2018-03-11 LAB — POC URINALSYSI DIPSTICK (AUTOMATED)
BILIRUBIN UA: NEGATIVE
Glucose, UA: NEGATIVE
Ketones, UA: NEGATIVE
NITRITE UA: NEGATIVE
PH UA: 6 (ref 5.0–8.0)
Protein, UA: NEGATIVE
UROBILINOGEN UA: 0.2 U/dL

## 2018-03-11 MED ORDER — SULFAMETHOXAZOLE-TRIMETHOPRIM 800-160 MG PO TABS: 1.0000 | ORAL_TABLET | Freq: Two times a day (BID) | ORAL | 0 refills | Status: DC

## 2018-03-11 NOTE — Assessment & Plan Note (Signed)
Frequency/urgency and bladder pressure since passing a stone Pos ua Send to cx tx with bactrim DS  (7 d course in light of flank pain and length of illness)  Update with cx result Enc water intake Update if not starting to improve in several days or if worsening

## 2018-03-11 NOTE — Assessment & Plan Note (Signed)
Monitor- no symptoms

## 2018-03-11 NOTE — Progress Notes (Signed)
Subjective:    Patient ID: Samantha Valentine, female    DOB: 1946/06/09, 72 y.o.   MRN: [id number]  HPI Here for urinary symptoms  4-6 weeks ago had symptoms of a kidney stone  Improved but kept pressure and frequency/urgency (unusual)  Cloudy urine and with bad odor She started inc her fluids   Still some pain in R flank - does not feel like a kidney stone    After she urinates -still feels like she has to go   Wt Readings from Last 3 Encounters:  03/11/18 164 lb 8 oz (74.6 kg)  01/25/18 168 lb (76.2 kg)  01/25/18 167 lb (75.8 kg)   28.24 kg/m   Results for orders placed or performed in visit on 03/11/18  POCT Urinalysis Dipstick (Automated)  Result Value Ref Range   Color, UA Light Yellow    Clarity, UA Cloudy    Glucose, UA Negative Negative   Bilirubin, UA Negative    Ketones, UA Negative    Spec Grav, UA <=1.005 (A) 1.010 - 1.025   Blood, UA 25 Ery/uL    pH, UA 6.0 5.0 - 8.0   Protein, UA Negative Negative   Urobilinogen, UA 0.2 0.2 or 1.0 E.U./dL   Nitrite, UA Negative    Leukocytes, UA Large (3+) (A) Negative     She takes Enbrel for RA  Ua: large leuk and small blood    Patient Active Problem List   Diagnosis Date Noted  . UTI (urinary tract infection) 03/11/2018  . Atherosclerosis of aorta ([place]) 03/11/2018  . Sleep disturbance 06/18/2017  . Tachycardia 05/25/2017  . Dysuria 05/25/2017  . Estrogen deficiency 03/22/2017  . Malignant neoplasm of upper-outer quadrant of left breast in female, estrogen receptor negative ([place]) 02/04/2017  . Tendinopathy of right shoulder 01/25/2017  . High risk medication use 09/29/2016  . DJD (degenerative joint disease), cervical 09/29/2016  . History of right hip replacement 09/29/2016  . Eczema 07/13/2014  . Adverse effect of immunosuppressive drug 04/27/2012  . ANXIETY DEPRESSION 10/28/2008  . Spinal stenosis 12/29/2007  . Hypothyroidism 12/28/2007  . Vitamin D deficiency 12/28/2007  . HEARING LOSS  12/28/2007  . Rheumatoid arthritis ([place]) 12/28/2007  . DDD (degenerative disc disease), lumbar 12/28/2007  . Fibromyalgia 12/28/2007  . Osteoporosis 12/28/2007  . MIGRAINES, HX OF 12/28/2007   Past Medical History:  Diagnosis Date  . Arthritis    RA  . Breast cancer ([place]) 01/29/2017  . Breast mass 1 year   . Cancer ([place]) 01/29/2017   left breast INVASIVE MAMMARY CARCINOMA, ER/PR positive  . Collagen vascular disease (HCC)    Rhematoid Arthritis  . DDD (degenerative disc disease)    in neck  . Depression   . Fibromyalgia   . GERD (gastroesophageal reflux disease)    NO MEDS  . History of kidney stones    MULTIPLE KIDNEY STONES BIL  . Hypothyroidism   . Migraine   . Osteoporosis   . Personal history of chemotherapy    prior to mastectomy  . Rheumatoid arthritis [facility])    Past Surgical History:  Procedure Laterality Date  . BREAST BIOPSY Left 01/29/2017   US guided biopsy INVASIVE MAMMARY CARCINOMA  . HIP ARTHROPLASTY    . JOINT REPLACEMENT Right 2003   total hip replacement  . LITHOTRIPSY  1997   kidney stone  . MASTECTOMY Left 01/29/2017  . MASTECTOMY W/ SENTINEL NODE BIOPSY Left 05/18/2017   Procedure: MASTECTOMY WITH SENTINEL LYMPH NODE BIOPSY;  Surgeon: Bary Castilla,  Forest Gleason, MD;  Location: [facility] ORS;  Service: General;  Laterality: Left;  . PORTACATH PLACEMENT Right 02/15/2017   Procedure: INSERTION PORT-A-CATH;  Surgeon: Robert Bellow, MD;  Location: [facility] ORS;  Service: General;  Laterality: Right;  . TONSILLECTOMY  1970   Social History   Tobacco Use  . Smoking status: Former Smoker    Packs/day: 1.50    Years: 25.00    Pack years: 37.50    Types: Cigarettes    Last attempt to quit: 10/20/1983    Years since quitting: 34.4  . Smokeless tobacco: Never Used  Substance Use Topics  . Alcohol use: No  . Drug use: No   Family History  Problem Relation Age of Onset  . Alcohol abuse Father   . Diabetes Father   . Stroke Father   . Hypertension Mother   .  Endometrial cancer Mother   . Osteoarthritis Mother   . Breast cancer Paternal Aunt 57  . Liver disease Sister   . Breast cancer Maternal Aunt   . Rheum arthritis Maternal Grandmother   . Diabetes Paternal Grandmother   . Parkinson's disease Paternal Grandfather    Allergies  Allergen Reactions  . Adhesive [Tape] Other (See Comments)    Skin blistered (PAPER TAPE OK)  . Hydroxychloroquine Sulfate Rash  . Ibandronate Sodium Rash  . Risedronate Sodium Rash   Current Outpatient Medications on File Prior to Visit  Medication Sig Dispense Refill  . aspirin EC 81 MG tablet Take 81 mg by mouth daily.    . celecoxib (CELEBREX) 200 MG capsule 1 capsule by mouth twice a day when necessary with meals 135 capsule 0  . cholecalciferol (VITAMIN D) 1000 units tablet Take 1,000 Units by mouth daily.    . DULoxetine (CYMBALTA) 30 MG capsule Take 1 capsule (30 mg total) by mouth daily. 90 capsule 0  . etanercept (ENBREL) 50 MG/ML injection Inject 50 mg into the skin once a week.    . levothyroxine (SYNTHROID, LEVOTHROID) 125 MCG tablet Take 1 tablet (125 mcg total) by mouth daily. 30 tablet 5  . LORazepam (ATIVAN) 1 MG tablet Take 1 tablet (1 mg total) by mouth at bedtime as needed for anxiety. 30 tablet 1   No current facility-administered medications on file prior to visit.     Review of Systems  Constitutional: Positive for fatigue. Negative for activity change, appetite change and fever.  HENT: Negative for congestion and sore throat.   Eyes: Negative for itching and visual disturbance.  Respiratory: Negative for cough and shortness of breath.   Cardiovascular: Negative for leg swelling.  Gastrointestinal: Negative for abdominal distention, abdominal pain, constipation, diarrhea and nausea.  Endocrine: Negative for cold intolerance and polydipsia.  Genitourinary: Positive for frequency and urgency. Negative for difficulty urinating, dysuria, flank pain, hematuria and vaginal discharge.        Nocturia   Musculoskeletal: Negative for myalgias.  Skin: Negative for rash.  Allergic/Immunologic: Negative for immunocompromised state.  Neurological: Negative for dizziness and weakness.  Hematological: Negative for adenopathy.       Objective:   Physical Exam  Constitutional: She appears well-developed and well-nourished. No distress.  overwt and well app  HENT:  Head: Normocephalic and atraumatic.  Eyes: Pupils are equal, round, and reactive to light. Conjunctivae and EOM are normal.  Neck: Normal range of motion. Neck supple.  Cardiovascular: Normal rate, regular rhythm and normal heart sounds.  Pulmonary/Chest: Effort normal and breath sounds normal.  Abdominal: Soft. Bowel sounds are  normal. She exhibits no distension. There is tenderness. There is no rebound.  No cva tenderness (pt c/o of R flank pain but not tender)   Mild suprapubic tenderness  Musculoskeletal: She exhibits no edema.  Lymphadenopathy:    She has no cervical adenopathy.  Neurological: She is alert. Coordination normal.  Skin: No rash noted.  Psychiatric: She has a normal mood and affect.          Assessment & Plan:   Problem List Items Addressed This Visit      Cardiovascular and Mediastinum   Atherosclerosis of aorta (HCC)    Monitor- no symptoms         Genitourinary   UTI (urinary tract infection) - Primary    Frequency/urgency and bladder pressure since passing a stone Pos ua Send to cx tx with bactrim DS  (7 d course in light of flank pain and length of illness)  Update with cx result Enc water intake Update if not starting to improve in several days or if worsening        Relevant Medications   sulfamethoxazole-trimethoprim (BACTRIM DS,SEPTRA DS) 800-160 MG tablet   Other Relevant Orders   Urine Culture     Other   Dysuria

## 2018-03-11 NOTE — Patient Instructions (Signed)
Keep drinking fluids/water   Take the bactrim DS as directed  If any problems or side effects please let us know  This can make you sun burn- please use sun block !  Update if not starting to improve in several days or if worsening    We will contact you with the culture results

## 2018-03-14 LAB — URINE CULTURE
MICRO NUMBER:: 90633486
SPECIMEN QUALITY:: ADEQUATE

## 2018-04-06 ENCOUNTER — Encounter: Payer: Self-pay | Admitting: Family Medicine

## 2018-04-07 ENCOUNTER — Other Ambulatory Visit (INDEPENDENT_AMBULATORY_CARE_PROVIDER_SITE_OTHER): Payer: PPO

## 2018-04-07 DIAGNOSIS — N3000 Acute cystitis without hematuria: Secondary | ICD-10-CM | POA: Diagnosis not present

## 2018-04-07 DIAGNOSIS — R300 Dysuria: Secondary | ICD-10-CM | POA: Diagnosis not present

## 2018-04-07 DIAGNOSIS — R350 Frequency of micturition: Secondary | ICD-10-CM | POA: Diagnosis not present

## 2018-04-07 NOTE — Telephone Encounter (Signed)
Per Dr. Glori Bickers pt will stop by and leave a urine sample for a UA/ culture

## 2018-04-09 LAB — URINE CULTURE
MICRO NUMBER:: 90739867
SPECIMEN QUALITY:: ADEQUATE

## 2018-04-11 ENCOUNTER — Other Ambulatory Visit: Payer: Self-pay | Admitting: Family Medicine

## 2018-04-11 MED ORDER — CIPROFLOXACIN HCL 250 MG PO TABS: 250.0000 mg | ORAL_TABLET | Freq: Two times a day (BID) | ORAL | 0 refills | Status: DC

## 2018-04-11 NOTE — Telephone Encounter (Signed)
Pt notified of urine cx results and Dr. Marliss Coots comments and Rx sent to pharmacy

## 2018-04-11 NOTE — Telephone Encounter (Signed)
Urine culture is pos for e coli- the same sensitivities She took sulfa (it came right back)- so I will px cipro this time   Pended- please ask what pharmacy to send to Thanks

## 2018-04-14 DIAGNOSIS — H2512 Age-related nuclear cataract, left eye: Secondary | ICD-10-CM | POA: Diagnosis not present

## 2018-04-18 ENCOUNTER — Other Ambulatory Visit: Payer: Self-pay | Admitting: Family Medicine

## 2018-04-19 ENCOUNTER — Encounter: Payer: Self-pay | Admitting: *Deleted

## 2018-04-25 ENCOUNTER — Other Ambulatory Visit: Payer: Self-pay | Admitting: Rheumatology

## 2018-04-25 NOTE — Telephone Encounter (Signed)
Last visit: 01/25/2018 Next visit: 06/28/2018   Okay to refill per Dr. Estanislado Pandy.

## 2018-04-26 ENCOUNTER — Telehealth: Payer: Self-pay | Admitting: *Deleted

## 2018-04-26 NOTE — Telephone Encounter (Signed)
Information has been submitted to pts insurance for verification of benefits. Awaiting response for coverage  

## 2018-04-28 ENCOUNTER — Encounter: Payer: Self-pay | Admitting: *Deleted

## 2018-04-28 ENCOUNTER — Other Ambulatory Visit: Payer: Self-pay

## 2018-04-28 ENCOUNTER — Ambulatory Visit
Admission: RE | Admit: 2018-04-28 | Discharge: 2018-04-28 | Disposition: A | Discharge status: Home / Self Care | Payer: PPO | Source: Ambulatory Visit | Attending: Ophthalmology | Admitting: Ophthalmology

## 2018-04-28 ENCOUNTER — Ambulatory Visit: Payer: PPO | Admitting: Anesthesiology

## 2018-04-28 ENCOUNTER — Encounter
Admission: RE | Disposition: A | Discharge status: Home / Self Care | Payer: Self-pay | Source: Ambulatory Visit | Attending: Ophthalmology

## 2018-04-28 DIAGNOSIS — K219 Gastro-esophageal reflux disease without esophagitis: Secondary | ICD-10-CM | POA: Diagnosis not present

## 2018-04-28 DIAGNOSIS — Z79899 Other long term (current) drug therapy: Secondary | ICD-10-CM | POA: Diagnosis not present

## 2018-04-28 DIAGNOSIS — M069 Rheumatoid arthritis, unspecified: Secondary | ICD-10-CM | POA: Diagnosis not present

## 2018-04-28 DIAGNOSIS — Z7982 Long term (current) use of aspirin: Secondary | ICD-10-CM | POA: Insufficient documentation

## 2018-04-28 DIAGNOSIS — F329 Major depressive disorder, single episode, unspecified: Secondary | ICD-10-CM | POA: Insufficient documentation

## 2018-04-28 DIAGNOSIS — Z87891 Personal history of nicotine dependence: Secondary | ICD-10-CM | POA: Insufficient documentation

## 2018-04-28 DIAGNOSIS — Z853 Personal history of malignant neoplasm of breast: Secondary | ICD-10-CM | POA: Insufficient documentation

## 2018-04-28 DIAGNOSIS — Z9012 Acquired absence of left breast and nipple: Secondary | ICD-10-CM | POA: Diagnosis not present

## 2018-04-28 DIAGNOSIS — F418 Other specified anxiety disorders: Secondary | ICD-10-CM | POA: Diagnosis not present

## 2018-04-28 DIAGNOSIS — M1990 Unspecified osteoarthritis, unspecified site: Secondary | ICD-10-CM | POA: Insufficient documentation

## 2018-04-28 DIAGNOSIS — Z96641 Presence of right artificial hip joint: Secondary | ICD-10-CM | POA: Insufficient documentation

## 2018-04-28 DIAGNOSIS — F419 Anxiety disorder, unspecified: Secondary | ICD-10-CM | POA: Insufficient documentation

## 2018-04-28 DIAGNOSIS — Z888 Allergy status to other drugs, medicaments and biological substances status: Secondary | ICD-10-CM | POA: Diagnosis not present

## 2018-04-28 DIAGNOSIS — H269 Unspecified cataract: Secondary | ICD-10-CM | POA: Diagnosis not present

## 2018-04-28 DIAGNOSIS — H2512 Age-related nuclear cataract, left eye: Secondary | ICD-10-CM | POA: Insufficient documentation

## 2018-04-28 DIAGNOSIS — E039 Hypothyroidism, unspecified: Secondary | ICD-10-CM | POA: Diagnosis not present

## 2018-04-28 DIAGNOSIS — J439 Emphysema, unspecified: Secondary | ICD-10-CM | POA: Insufficient documentation

## 2018-04-28 DIAGNOSIS — M797 Fibromyalgia: Secondary | ICD-10-CM | POA: Insufficient documentation

## 2018-04-28 HISTORY — DX: Unspecified hearing loss, unspecified ear: H91.90

## 2018-04-28 HISTORY — DX: Palpitations: R00.2

## 2018-04-28 HISTORY — DX: Dyspnea, unspecified: R06.00

## 2018-04-28 HISTORY — DX: Emphysema, unspecified: J43.9

## 2018-04-28 HISTORY — DX: Edema, unspecified: R60.9

## 2018-04-28 HISTORY — PX: CATARACT EXTRACTION W/PHACO: SHX586

## 2018-04-28 SURGERY — PHACOEMULSIFICATION, CATARACT, WITH IOL INSERTION
Anesthesia: Monitor Anesthesia Care | Site: Eye | Laterality: Left | Wound class: Clean

## 2018-04-28 MED ORDER — POVIDONE-IODINE 5 % OP SOLN
OPHTHALMIC | Status: AC
  Filled 2018-04-28: qty 30

## 2018-04-28 MED ORDER — EPINEPHRINE PF 1 MG/ML IJ SOLN
INTRAMUSCULAR | Status: AC
  Filled 2018-04-28: qty 1

## 2018-04-28 MED ORDER — POVIDONE-IODINE 5 % OP SOLN
OPHTHALMIC | Status: DC | PRN
  Administered 2018-04-28: 1 via OPHTHALMIC

## 2018-04-28 MED ORDER — LIDOCAINE HCL (PF) 4 % IJ SOLN
INTRAOCULAR | Status: DC | PRN
  Administered 2018-04-28: 1 mL via OPHTHALMIC

## 2018-04-28 MED ORDER — BSS IO SOLN
INTRAOCULAR | Status: DC | PRN
  Administered 2018-04-28: 250 mL via OPHTHALMIC

## 2018-04-28 MED ORDER — CYCLOPENTOLATE HCL 2 % OP SOLN
1.0000 [drp] | OPHTHALMIC | Status: AC
  Administered 2018-04-28 (×4): 1 [drp] via OPHTHALMIC

## 2018-04-28 MED ORDER — PHENYLEPHRINE HCL 10 % OP SOLN
1.0000 [drp] | OPHTHALMIC | Status: AC
  Administered 2018-04-28 (×4): 1 [drp] via OPHTHALMIC

## 2018-04-28 MED ORDER — NA HYALUR & NA CHOND-NA HYALUR 0.4-0.35 ML IO KIT
PACK | INTRAOCULAR | Status: DC | PRN
  Administered 2018-04-28: .35 mL via INTRAOCULAR

## 2018-04-28 MED ORDER — NA HYALUR & NA CHOND-NA HYALUR 0.55-0.5 ML IO KIT
PACK | INTRAOCULAR | Status: AC
  Filled 2018-04-28: qty 1.05

## 2018-04-28 MED ORDER — EPINEPHRINE PF 1 MG/ML IJ SOLN
INTRAOCULAR | Status: DC | PRN
  Administered 2018-04-28: 250 mL via OPHTHALMIC

## 2018-04-28 MED ORDER — TETRACAINE HCL 0.5 % OP SOLN
1.0000 [drp] | OPHTHALMIC | Status: DC | PRN
  Administered 2018-04-28: 1 [drp] via OPHTHALMIC

## 2018-04-28 MED ORDER — LIDOCAINE HCL (PF) 4 % IJ SOLN
INTRAMUSCULAR | Status: AC
  Filled 2018-04-28: qty 5

## 2018-04-28 MED ORDER — MOXIFLOXACIN HCL 0.5 % OP SOLN
1.0000 [drp] | OPHTHALMIC | Status: AC
  Administered 2018-04-28 (×3): 1 [drp] via OPHTHALMIC

## 2018-04-28 MED ORDER — NEOMYCIN-POLYMYXIN-DEXAMETH 0.1 % OP OINT
TOPICAL_OINTMENT | OPHTHALMIC | Status: DC | PRN
  Administered 2018-04-28: 1 via OPHTHALMIC

## 2018-04-28 MED ORDER — CARBACHOL 0.01 % IO SOLN
INTRAOCULAR | Status: DC | PRN
  Administered 2018-04-28: 0.5 mL via INTRAOCULAR

## 2018-04-28 MED ORDER — MIDAZOLAM HCL 2 MG/2ML IJ SOLN
INTRAMUSCULAR | Status: DC | PRN
  Administered 2018-04-28: 1 mg via INTRAVENOUS

## 2018-04-28 MED ORDER — FENTANYL CITRATE (PF) 100 MCG/2ML IJ SOLN
INTRAMUSCULAR | Status: AC
  Filled 2018-04-28: qty 2

## 2018-04-28 MED ORDER — MOXIFLOXACIN HCL 0.5 % OP SOLN: 1.0000 [drp] | OPHTHALMIC | Status: AC

## 2018-04-28 MED ORDER — MIDAZOLAM HCL 2 MG/2ML IJ SOLN
INTRAMUSCULAR | Status: AC
  Filled 2018-04-28: qty 2

## 2018-04-28 MED ORDER — FENTANYL CITRATE (PF) 100 MCG/2ML IJ SOLN
INTRAMUSCULAR | Status: DC | PRN
  Administered 2018-04-28: 25 ug via INTRAVENOUS
  Administered 2018-04-28: 50 ug via INTRAVENOUS
  Administered 2018-04-28: 25 ug via INTRAVENOUS

## 2018-04-28 MED ORDER — SODIUM CHLORIDE 0.9 % IV SOLN
INTRAVENOUS | Status: DC
  Administered 2018-04-28: 08:00:00 via INTRAVENOUS

## 2018-04-28 SURGICAL SUPPLY — 18 items

## 2018-04-28 NOTE — Anesthesia Preprocedure Evaluation (Signed)
Anesthesia Evaluation  Patient identified by MRN, date of birth, ID band Patient awake    Reviewed: Allergy & Precautions, H&P , NPO status , Patient's Chart, lab work & pertinent test results, reviewed documented beta blocker date and time   History of Anesthesia Complications Negative for: history of anesthetic complications  Airway Mallampati: II  TM Distance: >3 FB Neck ROM: full    Dental no notable dental hx. (+) Dental Advidsory Given   Pulmonary neg pulmonary ROS, COPD (emphysematous changes on CT, no inhalers, no O2, good exercise tolerance), former smoker,    Pulmonary exam normal        Cardiovascular Exercise Tolerance: Good (-) angina(-) Past MI, (-) Cardiac Stents, (-) CABG and (-) CHF negative cardio ROS Normal cardiovascular exam(-) dysrhythmias  Rhythm:Regular Rate:Normal     Neuro/Psych  Headaches, PSYCHIATRIC DISORDERS Depression  Neuromuscular disease (fibromyalgia) negative neurological ROS  negative psych ROS   GI/Hepatic negative GI ROS, Neg liver ROS, GERD  ,  Endo/Other  negative endocrine ROSHypothyroidism   Renal/GU negative Renal ROS  negative genitourinary   Musculoskeletal  (+) Arthritis , Rheumatoid disorders,  Fibromyalgia -  Abdominal   Peds  Hematology negative hematology ROS (+)   Anesthesia Other Findings Past Medical History: No date: Arthritis     Comment:  RA 01/29/2017: Breast cancer ([place]) 1 year : Breast mass 01/29/2017: Cancer ([place])     Comment:  left breast INVASIVE MAMMARY CARCINOMA, ER/PR positive No date: Collagen vascular disease ([place])     Comment:  Rhematoid Arthritis No date: DDD (degenerative disc disease)     Comment:  in neck No date: Depression No date: Dyspnea No date: Edema     Comment:  FEET/LEGS No date: Emphysema of lung (HCC) No date: Fibromyalgia No date: GERD (gastroesophageal reflux disease)     Comment:  NO MEDS No date: History of kidney  stones     Comment:  MULTIPLE KIDNEY STONES BIL No date: HOH (hard of hearing)     Comment:  AIDS No date: Hypothyroidism No date: Migraine No date: Osteoporosis No date: Palpitations No date: Personal history of chemotherapy     Comment:  prior to mastectomy No date: Rheumatoid arthritis ([place])  Past Surgical History: 01/29/2017: BREAST BIOPSY; Left     Comment:  US guided biopsy INVASIVE MAMMARY CARCINOMA No date: HIP ARTHROPLASTY 2003: JOINT REPLACEMENT; Right     Comment:  total hip replacement 1997: LITHOTRIPSY     Comment:  kidney stone 01/29/2017: MASTECTOMY; Left 05/18/2017: MASTECTOMY W/ SENTINEL NODE BIOPSY; Left     Comment:  Procedure: MASTECTOMY WITH SENTINEL LYMPH NODE BIOPSY;                Surgeon: Robert Bellow, MD;  Location: [facility] ORS;                Service: General;  Laterality: Left; 09/2017: PORT-A-CATH REMOVAL 02/15/2017: PORTACATH PLACEMENT; Right     Comment:  Procedure: INSERTION PORT-A-CATH;  Surgeon: Robert Bellow, MD;  Location: [facility] ORS;  Service: General;                Laterality: Right; 1970: TONSILLECTOMY  BMI    Body Mass Index:  28.49 kg/m     Reproductive/Obstetrics negative OB ROS  Anesthesia Physical Anesthesia Plan  ASA: III  Anesthesia Plan: MAC   Post-op Pain Management:    Induction:   PONV Risk Score and Plan:   Airway Management Planned: Natural Airway  Additional Equipment:   Intra-op Plan:   Post-operative Plan:   Informed Consent: I have reviewed the patients History and Physical, chart, labs and discussed the procedure including the risks, benefits and alternatives for the proposed anesthesia with the patient or authorized representative who has indicated his/her understanding and acceptance.   Dental Advisory Given  Plan Discussed with: Anesthesiologist, CRNA and Surgeon  Anesthesia Plan Comments:         Anesthesia Quick  Evaluation

## 2018-04-28 NOTE — Anesthesia Postprocedure Evaluation (Signed)
Anesthesia Post Note  Patient: Samantha Valentine  Procedure(s) Performed: CATARACT EXTRACTION PHACO AND INTRAOCULAR LENS PLACEMENT ([place]) (Left Eye)  Patient location during evaluation: PACU Anesthesia Type: MAC Level of consciousness: awake, oriented and awake and alert Pain management: pain level controlled Vital Signs Assessment: post-procedure vital signs reviewed and stable Respiratory status: spontaneous breathing Cardiovascular status: blood pressure returned to baseline Postop Assessment: no headache Anesthetic complications: no     Last Vitals:  Vitals:   04/28/18 0736 04/28/18 0943  BP: (!) 148/89 124/69  Pulse: 85 83  Resp: 12 16  Temp: (!) 36.2 C (!) 36.2 C  SpO2: 98% 98%    Last Pain:  Vitals:   04/28/18 0943  TempSrc: Temporal  PainSc: 0-No pain                 Philbert Riser

## 2018-04-28 NOTE — Anesthesia Post-op Follow-up Note (Signed)
Anesthesia QCDR form completed.        

## 2018-04-28 NOTE — H&P (Signed)
The History and Physical notes are on paper, have been signed, and are to be scanned. The patient remains stable and unchanged from the H&P.   Previous H&P reviewed, patient examined, and there are no changes.  Samantha Valentine 04/28/2018 8:13 AM

## 2018-04-28 NOTE — Transfer of Care (Signed)
Immediate Anesthesia Transfer of Care Note  Patient: Samantha Valentine  Procedure(s) Performed: CATARACT EXTRACTION PHACO AND INTRAOCULAR LENS PLACEMENT ([facility]) (Left Eye)  Patient Location: PACU  Anesthesia Type:MAC  Level of Consciousness: awake, alert  and oriented  Airway & Oxygen Therapy: Patient Spontanous Breathing  Post-op Assessment: Report given to RN  Post vital signs: Reviewed and stable  Last Vitals:  Vitals Value Taken Time  BP    Temp    Pulse    Resp    SpO2      Last Pain:  Vitals:   04/28/18 0736  TempSrc: Oral  PainSc: 0-No pain         Complications: No apparent anesthesia complications

## 2018-04-28 NOTE — Op Note (Signed)
OPERATIVE NOTE  Samantha Valentine [id number] 04/28/2018   PREOPERATIVE DIAGNOSIS:  Nuclear sclerotic cataract left eye. H25.12   POSTOPERATIVE DIAGNOSIS:    Nuclear sclerotic cataract left eye.     PROCEDURE:  Phacoemusification with posterior chamber intraocular lens placement of the left eye   LENS:  PCB00 19.0 D   ULTRASOUND TIME: 13 % of 1 minutes, 3 seconds.  CDE 8.4   SURGEON:  Wyonia Hough, MD   ANESTHESIA:  Topical with tetracaine drops and 2% Xylocaine jelly, augmented with 1% preservative-free intracameral lidocaine.    COMPLICATIONS:  None.   DESCRIPTION OF PROCEDURE:  The patient was identified in the holding room and transported to the operating room and placed in the supine position under the operating microscope.  The left eye was identified as the operative eye and it was prepped and draped in the usual sterile ophthalmic fashion.   A 1 millimeter clear-corneal paracentesis was made at the 1:30 position. 0.5 ml of preservative-free 1% lidocaine was injected into the anterior chamber.  The anterior chamber was filled with Viscoat viscoelastic.  A 2.4 millimeter keratome was used to make a near-clear corneal incision at the 10:30 position.  .  A curvilinear capsulorrhexis was made with a cystotome and capsulorrhexis forceps.  Balanced salt solution was used to hydrodissect and hydrodelineate the nucleus.   Phacoemulsification was then used in stop and chop fashion to remove the lens nucleus and epinucleus.  The remaining cortex was then removed using the irrigation and aspiration handpiece. Provisc was then placed into the capsular bag to distend it for lens placement.  A lens was then injected into the capsular bag.  The remaining viscoelastic was aspirated.   Wounds were hydrated with balanced salt solution.  The anterior chamber was inflated to a physiologic pressure with balanced salt solution. Vigamox 0.2 ml of a 1mg  per ml solution was injected into the  anterior chamber for a dose of 0.2 mg of intracameral antibiotic at the completion of the case.  Miostat was placed into the anterior chamber to constrict the pupil.  No wound leaks were noted.  Topical Vigamox drops and Maxitrol ointment were applied to the eye.  The patient was taken to the recovery room in stable condition without complications of anesthesia or surgery  [name] 04/28/2018, 9:40 AM

## 2018-04-28 NOTE — Discharge Instructions (Signed)
Eye Surgery Discharge Instructions    Expect mild scratchy sensation or mild soreness. DO NOT RUB YOUR EYE!  The day of surgery:  Minimal physical activity, but bed rest is not required  No reading, computer work, or close hand work  No bending, lifting, or straining.  May watch TV  For 24 hours:  No driving, legal decisions, or alcoholic beverages  Safety precautions  Eat anything you prefer: It is better to start with liquids, then soup then solid foods.  _____ Eye patch should be worn until postoperative exam tomorrow.  ____ Solar shield eyeglasses should be worn for comfort in the sunlight/patch while sleeping  Resume all regular medications including aspirin or Coumadin if these were discontinued prior to surgery. You may shower, bathe, shave, or wash your hair. Tylenol may be taken for mild discomfort.  Call your doctor if you experience significant pain, nausea, or vomiting, fever > 101 or other signs of infection. [phone] or [phone] Specific instructions:  Follow-up Information    Leandrew Koyanagi, MD Follow up.   Specialty:  Ophthalmology Why:  04/28/18 at 3:20 Contact information: [place] [id number] [phone]          Eye Surgery Discharge Instructions    Expect mild scratchy sensation or mild soreness. DO NOT RUB YOUR EYE!  The day of surgery:  Minimal physical activity, but bed rest is not required  No reading, computer work, or close hand work  No bending, lifting, or straining.  May watch TV  For 24 hours:  No driving, legal decisions, or alcoholic beverages  Safety precautions  Eat anything you prefer: It is better to start with liquids, then soup then solid foods.  _____ Eye patch should be worn until postoperative exam tomorrow.  ____ Solar shield eyeglasses should be worn for comfort in the sunlight/patch while sleeping  Resume all regular medications including aspirin or Coumadin  if these were discontinued prior to surgery. You may shower, bathe, shave, or wash your hair. Tylenol may be taken for mild discomfort.  Call your doctor if you experience significant pain, nausea, or vomiting, fever > 101 or other signs of infection. [phone] or [phone] Specific instructions:  Follow-up Information    Leandrew Koyanagi, MD Follow up.   Specialty:  Ophthalmology Why:  04/28/18 at 3:20 Contact information: [address]   [place] [address] [phone]

## 2018-05-13 ENCOUNTER — Other Ambulatory Visit: Payer: Self-pay

## 2018-05-13 DIAGNOSIS — Z79899 Other long term (current) drug therapy: Secondary | ICD-10-CM

## 2018-05-14 LAB — COMPLETE METABOLIC PANEL WITH GFR
AG Ratio: 1.4 (calc) (ref 1.0–2.5)
ALKALINE PHOSPHATASE (APISO): 64 U/L (ref 33–130)
ALT: 10 U/L (ref 6–29)
AST: 14 U/L (ref 10–35)
Albumin: 3.9 g/dL (ref 3.6–5.1)
BILIRUBIN TOTAL: 0.3 mg/dL (ref 0.2–1.2)
BUN: 17 mg/dL (ref 7–25)
CHLORIDE: 106 mmol/L (ref 98–110)
CO2: 26 mmol/L (ref 20–32)
Calcium: 9.2 mg/dL (ref 8.6–10.4)
Creat: 0.92 mg/dL (ref 0.60–0.93)
GFR, Est African American: 73 mL/min/{1.73_m2} (ref >=60)
GFR, Est Non African American: 63 mL/min/{1.73_m2} (ref >=60)
Globulin: 2.8 g/dL (calc) (ref 1.9–3.7)
Glucose, Bld: 95 mg/dL (ref 65–99)
Potassium: 4.3 mmol/L (ref 3.5–5.3)
Sodium: 140 mmol/L (ref 135–146)
Total Protein: 6.7 g/dL (ref 6.1–8.1)

## 2018-05-14 LAB — CBC WITH DIFFERENTIAL/PLATELET
BASOS PCT: 0.6 %
Basophils Absolute: 42 cells/uL (ref 0–200)
EOS PCT: 3.4 %
Eosinophils Absolute: 238 cells/uL (ref 15–500)
HEMATOCRIT: 42.1 % (ref 35.0–45.0)
Hemoglobin: 14 g/dL (ref 11.7–15.5)
LYMPHS ABS: 2198 {cells}/uL (ref 850–3900)
MCH: 31 pg (ref 27.0–33.0)
MCHC: 33.3 g/dL (ref 32.0–36.0)
MCV: 93.3 fL (ref 80.0–100.0)
MPV: 11.5 fL (ref 7.5–12.5)
Monocytes Relative: 8.7 %
Neutro Abs: 3913 cells/uL (ref 1500–7800)
Neutrophils Relative %: 55.9 %
PLATELETS: 234 10*3/uL (ref 140–400)
RBC: 4.51 10*6/uL (ref 3.80–5.10)
RDW: 13.2 % (ref 11.0–15.0)
Total Lymphocyte: 31.4 %
WBC mixed population: 609 cells/uL (ref 200–950)
WBC: 7 10*3/uL (ref 3.8–10.8)

## 2018-05-24 NOTE — Telephone Encounter (Signed)
Verification of benefits have been processed and an approval has been received for pts prolia injection. Pts estimated cost are appx $250. This is only an estimate and cannot be confirmed until benefits are paid. Please advise pt and schedule if needed. If scheduled, once the injection is received, pls contact me back with the date it was received so that I am able to update prolia folder. thanks   Spoke to pt; agreeable. Prolia injection scheduled.

## 2018-05-25 DIAGNOSIS — H2511 Age-related nuclear cataract, right eye: Secondary | ICD-10-CM | POA: Diagnosis not present

## 2018-05-29 ENCOUNTER — Encounter: Payer: Self-pay | Admitting: Rheumatology

## 2018-05-30 ENCOUNTER — Encounter: Payer: Self-pay | Admitting: *Deleted

## 2018-05-30 ENCOUNTER — Telehealth: Payer: Self-pay

## 2018-05-30 MED ORDER — ETANERCEPT 50 MG/ML ~~LOC~~ SOSY: 50.0000 mg | PREFILLED_SYRINGE | SUBCUTANEOUS | 0 refills | Status: DC

## 2018-05-30 NOTE — Telephone Encounter (Signed)
Last visit: 01/25/2018 Next visit: 06/28/2018 Labs: 05/13/2018 WNL  Tb gold: 09/03/2017 negative   Okay to refill, per Dr. Estanislado Pandy.  Refill faxed to [employer].

## 2018-05-30 NOTE — Pre-Procedure Instructions (Signed)
LM FOR PHARMACY THAT [facility] NEEDED FOR 06/09/18. ALSO THAT DR Ethelene Hal ZOFRAN IV GIVEN.

## 2018-05-30 NOTE — Anesthesia Pain Management Evaluation Note (Signed)
DR Ethelene Hal ZOFRAN IV GIVEN

## 2018-06-03 IMAGING — MG US BREAST BX W LOC DEV 1ST LESION IMG BX SPEC US GUIDE*L*
1 series · 8 of 8 positions shown · non-contrast
Comparison: Previous exam(s).

ADDENDUM:
PATHOLOGY: Grade 2 invasive mammary carcinoma, no special type.

CONCORDANT: Yes
I telephoned the patient on 02/01/2017 at [DATE] p.m. and discussed
these results and the recommendations stated below. All questions
were answered. The patient denies significant pain or bleeding from
the biopsy site. Biopsy site care instructions were reviewed and the
patient was asked to call [HOSPITAL] with any questions
or issues related to the biopsy.
RECOMMENDATION: Surgical excision and multidisciplinary consultation
recommended. The patient has rheumatoid arthritis and is concerned
about the combination of treatment of breast cancer and her
rheumatoid disease, and is inclined to not undergo treatment for her
breast cancer because of this. I advised her to at least have
consultations with potential treating physicians to determine her
options before making any final decisions. The patient had a
surgical consultation with Dr. Giorgi on 02/04/2017.
CLINICAL DATA: 71-year-old female presenting for ultrasound-guided
biopsy of a left breast mass.
EXAM:
ULTRASOUND GUIDED LEFT BREAST CORE NEEDLE BIOPSY

[Series 1: MG view · 0.07mm/px · 8 of 10 slices shown]
[im 1/10]
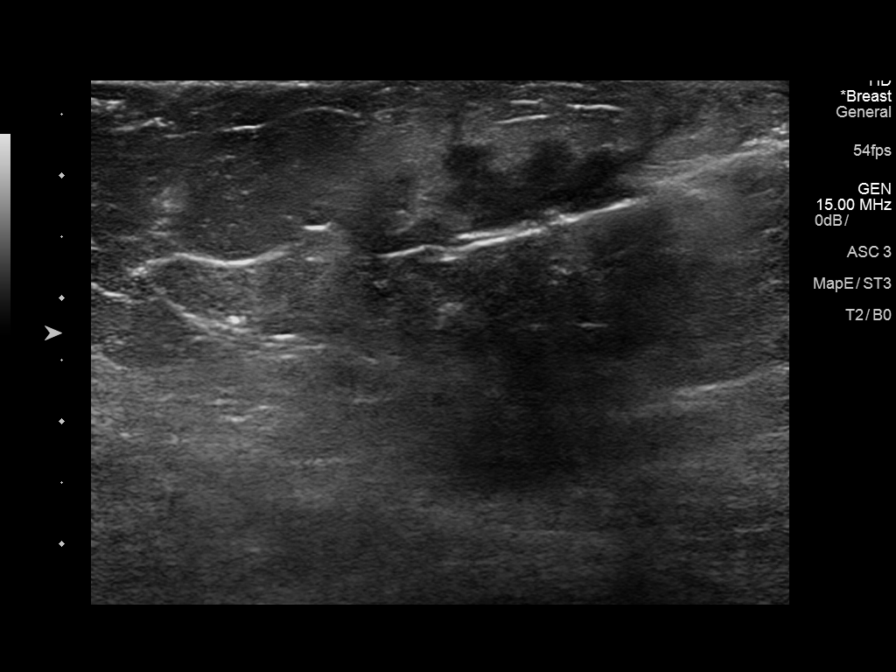
[im 2/10]
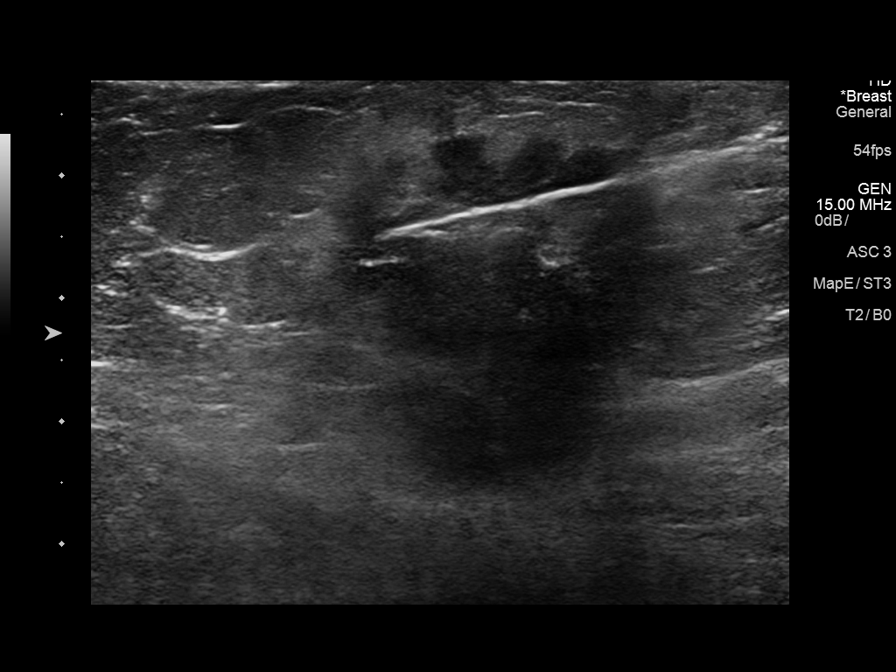
[im 3/10]
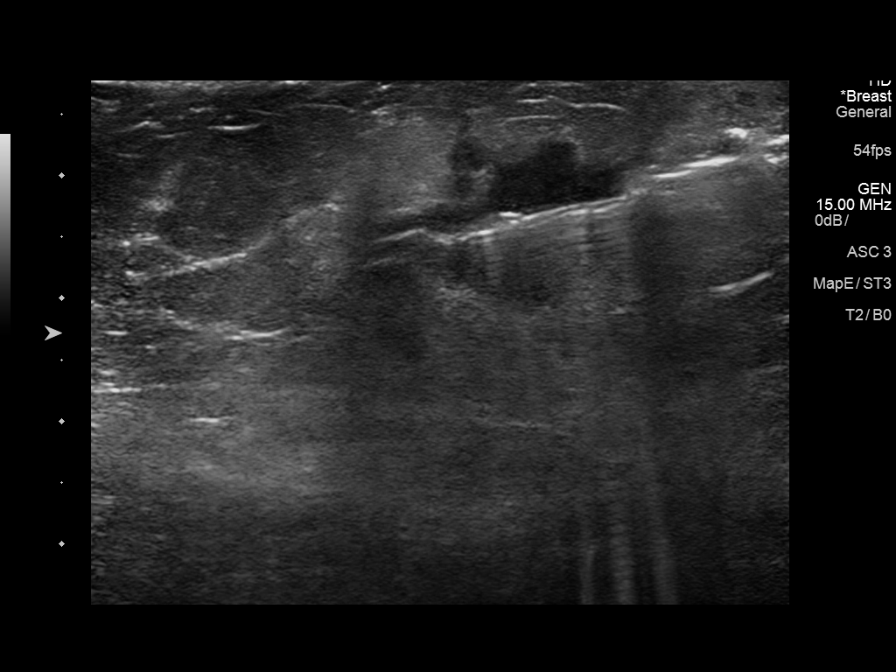
[im 4/10]
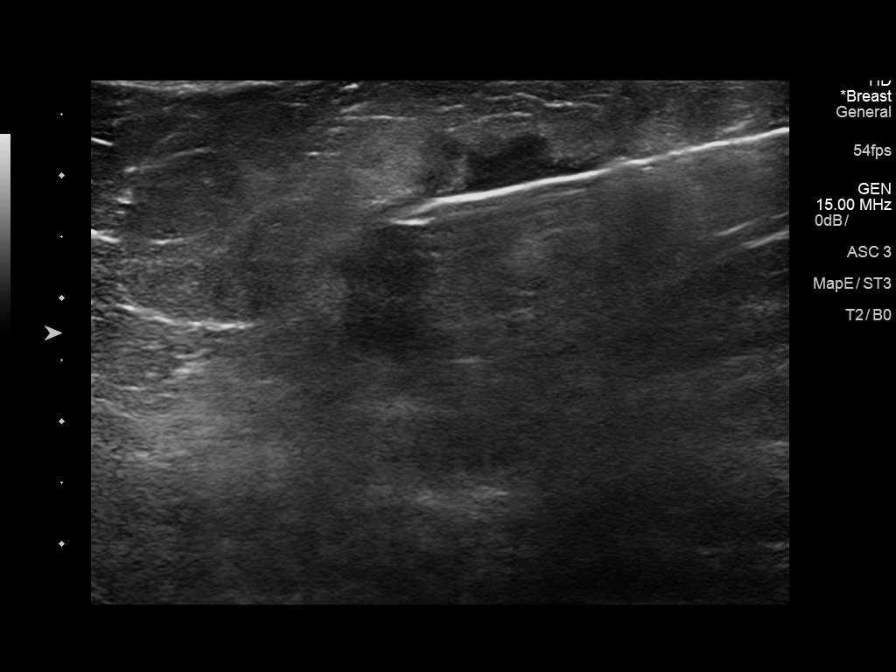
[im 6/10]
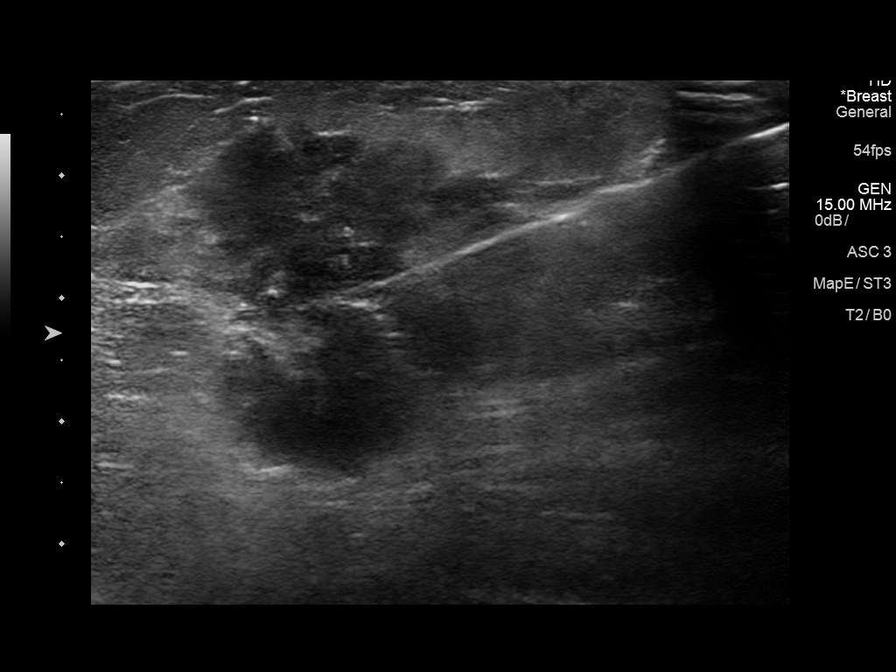
[im 7/10]
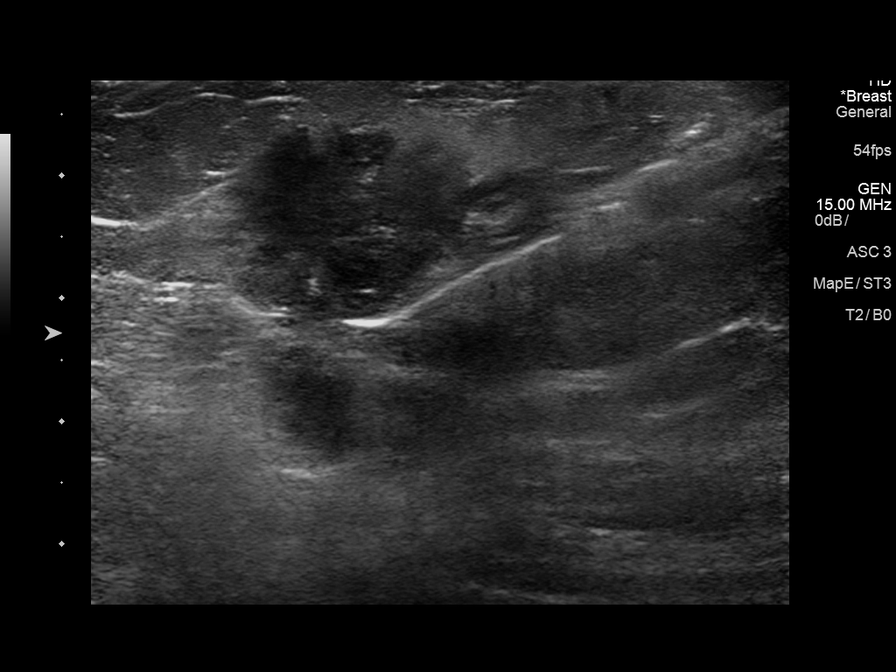
[im 8/10]
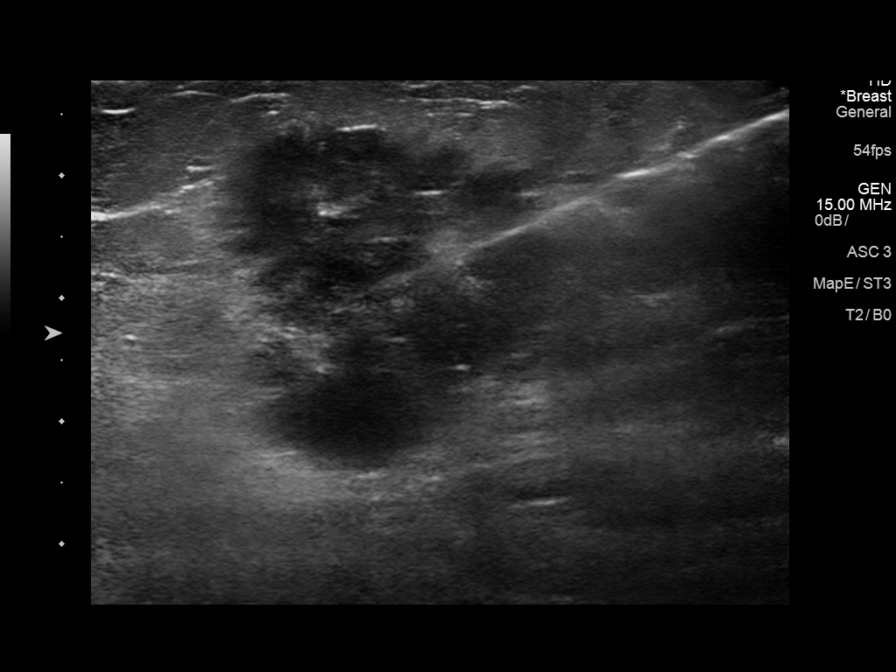
[im 10/10]
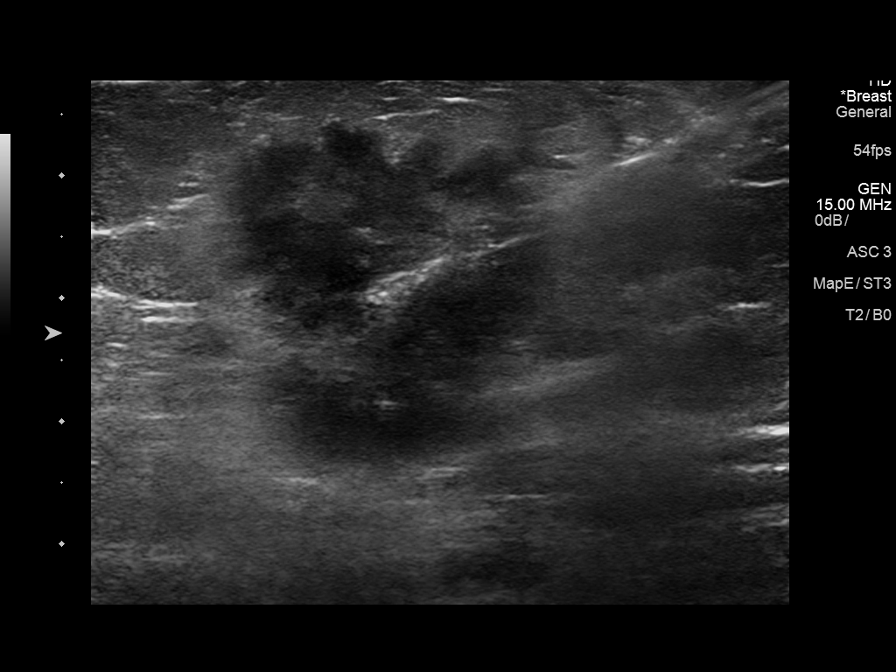

[8 of 8 positions shown; findings below may reference images not displayed]



Lesion quadrant: Upper-outer

Using sterile technique and 1% Lidocaine as local anesthetic, under
direct ultrasound visualization, a 14 gauge Delilah device was
used to perform biopsy of mass in the left breast at 2 o'clock using
an inferolateral approach. At the conclusion of the procedure a wing
shaped tissue marker clip was deployed into the biopsy cavity.
Follow up 2 view mammogram was performed and dictated separately.
IMPRESSION: Ultrasound guided biopsy of left breast mass at 2 o'clock. No
apparent complications.

## 2018-06-03 IMAGING — MG MM BREAST LOCALIZATION CLIP
2 series · 2 of 2 positions shown · non-contrast
Comparison: Previous exam(s).

CLINICAL DATA: Post biopsy mammogram of the left breast for clip
placement.

EXAM:
DIAGNOSTIC LEFT MAMMOGRAM POST ULTRASOUND BIOPSY

[L ML]
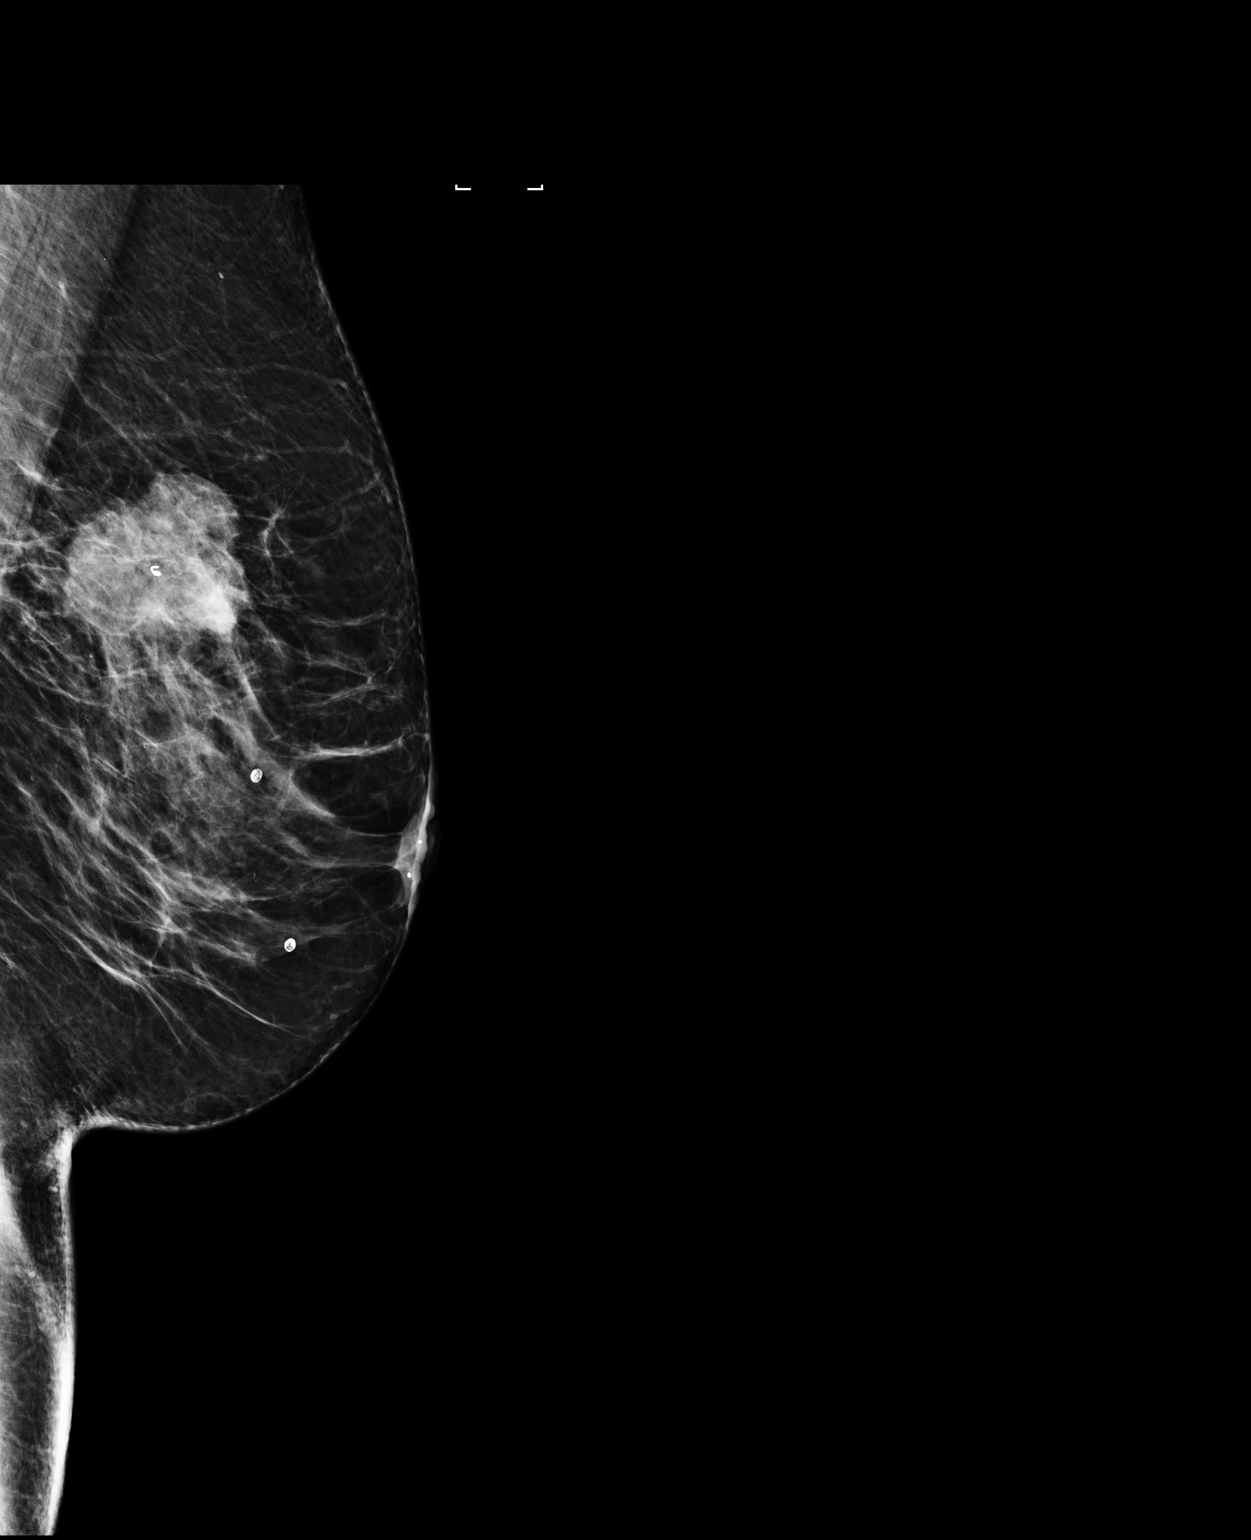

[L CC]
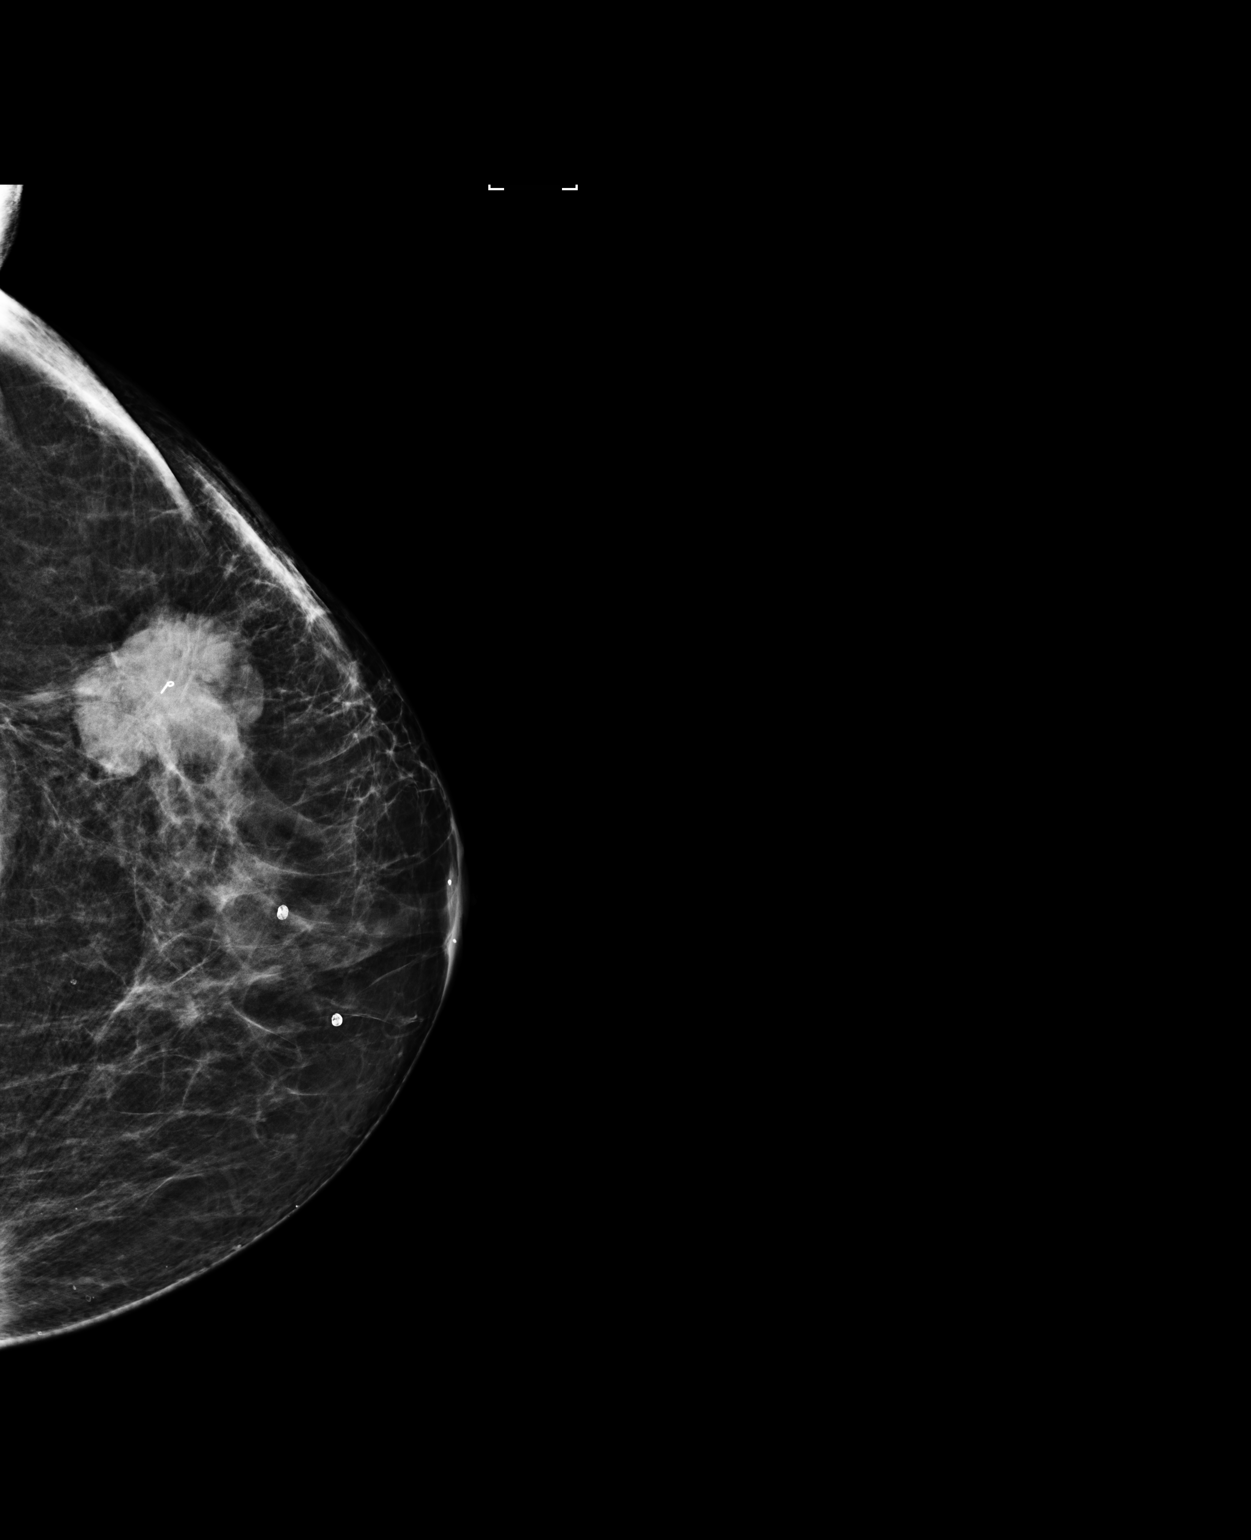

[2 of 2 positions shown; findings below may reference images not displayed]

FINDINGS: Mammographic images were obtained following ultrasound guided biopsy
of left breast mass at 2 o'clock. The wing shaped biopsy marking
clip is appropriately positioned within the biopsied mass in the
upper-outer left breast.
IMPRESSION: Appropriate positioning of the wing shaped biopsy marking clip in
the mass in the upper-outer left breast.

Final Assessment: Post Procedure Mammograms for Marker Placement

## 2018-06-07 ENCOUNTER — Ambulatory Visit (INDEPENDENT_AMBULATORY_CARE_PROVIDER_SITE_OTHER): Payer: PPO | Admitting: *Deleted

## 2018-06-07 DIAGNOSIS — M810 Age-related osteoporosis without current pathological fracture: Secondary | ICD-10-CM

## 2018-06-07 MED ORDER — DENOSUMAB 60 MG/ML ~~LOC~~ SOSY
60.0000 mg | PREFILLED_SYRINGE | Freq: Once | SUBCUTANEOUS | Status: AC
  Administered 2018-06-07: 60 mg via SUBCUTANEOUS

## 2018-06-07 NOTE — Progress Notes (Signed)
Per orders of Dr. Glori Bickers, injection of denosumab (Prolia) 60 mg/mL given by Deanglo Hissong. Patient tolerated injection well.

## 2018-06-09 ENCOUNTER — Other Ambulatory Visit: Payer: Self-pay

## 2018-06-09 ENCOUNTER — Encounter
Admission: RE | Disposition: A | Discharge status: Home / Self Care | Payer: Self-pay | Source: Ambulatory Visit | Attending: Ophthalmology

## 2018-06-09 ENCOUNTER — Encounter: Payer: Self-pay | Admitting: *Deleted

## 2018-06-09 ENCOUNTER — Ambulatory Visit
Admission: RE | Admit: 2018-06-09 | Discharge: 2018-06-09 | Disposition: A | Discharge status: Home / Self Care | Payer: PPO | Source: Ambulatory Visit | Attending: Ophthalmology | Admitting: Ophthalmology

## 2018-06-09 ENCOUNTER — Ambulatory Visit: Payer: PPO | Admitting: Anesthesiology

## 2018-06-09 DIAGNOSIS — K449 Diaphragmatic hernia without obstruction or gangrene: Secondary | ICD-10-CM | POA: Diagnosis not present

## 2018-06-09 DIAGNOSIS — H2511 Age-related nuclear cataract, right eye: Secondary | ICD-10-CM | POA: Insufficient documentation

## 2018-06-09 DIAGNOSIS — M810 Age-related osteoporosis without current pathological fracture: Secondary | ICD-10-CM | POA: Diagnosis not present

## 2018-06-09 DIAGNOSIS — E039 Hypothyroidism, unspecified: Secondary | ICD-10-CM | POA: Diagnosis not present

## 2018-06-09 DIAGNOSIS — H9193 Unspecified hearing loss, bilateral: Secondary | ICD-10-CM | POA: Diagnosis not present

## 2018-06-09 DIAGNOSIS — F418 Other specified anxiety disorders: Secondary | ICD-10-CM | POA: Insufficient documentation

## 2018-06-09 DIAGNOSIS — M069 Rheumatoid arthritis, unspecified: Secondary | ICD-10-CM | POA: Diagnosis not present

## 2018-06-09 DIAGNOSIS — Z888 Allergy status to other drugs, medicaments and biological substances status: Secondary | ICD-10-CM | POA: Insufficient documentation

## 2018-06-09 DIAGNOSIS — M7989 Other specified soft tissue disorders: Secondary | ICD-10-CM | POA: Diagnosis not present

## 2018-06-09 DIAGNOSIS — J439 Emphysema, unspecified: Secondary | ICD-10-CM | POA: Insufficient documentation

## 2018-06-09 DIAGNOSIS — Z87442 Personal history of urinary calculi: Secondary | ICD-10-CM | POA: Insufficient documentation

## 2018-06-09 DIAGNOSIS — Z853 Personal history of malignant neoplasm of breast: Secondary | ICD-10-CM | POA: Diagnosis not present

## 2018-06-09 DIAGNOSIS — Z90710 Acquired absence of both cervix and uterus: Secondary | ICD-10-CM | POA: Diagnosis not present

## 2018-06-09 DIAGNOSIS — H269 Unspecified cataract: Secondary | ICD-10-CM | POA: Diagnosis not present

## 2018-06-09 DIAGNOSIS — Z87891 Personal history of nicotine dependence: Secondary | ICD-10-CM | POA: Diagnosis not present

## 2018-06-09 HISTORY — PX: CATARACT EXTRACTION W/PHACO: SHX586

## 2018-06-09 HISTORY — DX: Other specified postprocedural states: R11.2

## 2018-06-09 HISTORY — DX: Anxiety disorder, unspecified: F41.9

## 2018-06-09 HISTORY — DX: Personal history of other diseases of the digestive system: Z87.19

## 2018-06-09 HISTORY — DX: Adverse effect of unspecified anesthetic, initial encounter: T41.45XA

## 2018-06-09 HISTORY — DX: Other complications of anesthesia, initial encounter: T88.59XA

## 2018-06-09 HISTORY — DX: Other specified postprocedural states: Z98.890

## 2018-06-09 SURGERY — PHACOEMULSIFICATION, CATARACT, WITH IOL INSERTION
Anesthesia: Monitor Anesthesia Care | Site: Eye | Laterality: Right | Wound class: Clean

## 2018-06-09 MED ORDER — MOXIFLOXACIN HCL 0.5 % OP SOLN
1.0000 [drp] | OPHTHALMIC | Status: AC
  Administered 2018-06-09 (×3): 1 [drp] via OPHTHALMIC

## 2018-06-09 MED ORDER — CARBACHOL 0.01 % IO SOLN
INTRAOCULAR | Status: DC | PRN
  Administered 2018-06-09: 0.5 mL via INTRAOCULAR

## 2018-06-09 MED ORDER — MOXIFLOXACIN HCL 0.5 % OP SOLN
OPHTHALMIC | Status: DC | PRN
  Administered 2018-06-09: 0.5 mL via OPHTHALMIC

## 2018-06-09 MED ORDER — POVIDONE-IODINE 5 % OP SOLN
OPHTHALMIC | Status: DC | PRN
  Administered 2018-06-09: 1 via OPHTHALMIC

## 2018-06-09 MED ORDER — SODIUM CHLORIDE 0.9 % IV SOLN
INTRAVENOUS | Status: DC
  Administered 2018-06-09 (×2): via INTRAVENOUS

## 2018-06-09 MED ORDER — MIDAZOLAM HCL 2 MG/2ML IJ SOLN
INTRAMUSCULAR | Status: DC | PRN
  Administered 2018-06-09: 0.5 mg via INTRAVENOUS
  Administered 2018-06-09: 1 mg via INTRAVENOUS

## 2018-06-09 MED ORDER — ONDANSETRON HCL 4 MG/2ML IJ SOLN
INTRAMUSCULAR | Status: DC | PRN
  Administered 2018-06-09: 4 mg via INTRAVENOUS

## 2018-06-09 MED ORDER — ARMC OPHTHALMIC DILATING DROPS
1.0000 "application " | OPHTHALMIC | Status: DC | PRN
  Administered 2018-06-09 (×2): 1 via OPHTHALMIC

## 2018-06-09 MED ORDER — NA HYALUR & NA CHOND-NA HYALUR 0.55-0.5 ML IO KIT
PACK | INTRAOCULAR | Status: AC
  Filled 2018-06-09: qty 1.05

## 2018-06-09 MED ORDER — PHENYLEPHRINE HCL 10 % OP SOLN
1.0000 [drp] | OPHTHALMIC | Status: DC
  Administered 2018-06-09: 1 [drp] via OPHTHALMIC

## 2018-06-09 MED ORDER — ARMC OPHTHALMIC DILATING DROPS
OPHTHALMIC | Status: AC
  Administered 2018-06-09: 1 via OPHTHALMIC
  Filled 2018-06-09: qty 0.5

## 2018-06-09 MED ORDER — EPINEPHRINE PF 1 MG/ML IJ SOLN
INTRAMUSCULAR | Status: AC
  Filled 2018-06-09: qty 2

## 2018-06-09 MED ORDER — TETRACAINE HCL 0.5 % OP SOLN
1.0000 [drp] | Freq: Once | OPHTHALMIC | Status: DC
  Administered 2018-06-09: 1 [drp] via OPHTHALMIC

## 2018-06-09 MED ORDER — LIDOCAINE HCL (PF) 4 % IJ SOLN
INTRAOCULAR | Status: DC | PRN
  Administered 2018-06-09: 1 mL via OPHTHALMIC

## 2018-06-09 MED ORDER — NA HYALUR & NA CHOND-NA HYALUR 0.4-0.35 ML IO KIT
PACK | INTRAOCULAR | Status: DC | PRN
  Administered 2018-06-09: 1 mL via INTRAOCULAR

## 2018-06-09 MED ORDER — PHENYLEPHRINE HCL 10 % OP SOLN
OPHTHALMIC | Status: AC
  Filled 2018-06-09: qty 5

## 2018-06-09 MED ORDER — TETRACAINE HCL 0.5 % OP SOLN
OPHTHALMIC | Status: AC
  Filled 2018-06-09: qty 4

## 2018-06-09 MED ORDER — PHENYLEPHRINE-KETOROLAC 1-0.3 % IO SOLN
INTRAOCULAR | Status: DC | PRN
  Administered 2018-06-09: 58 mL via OPHTHALMIC

## 2018-06-09 MED ORDER — EPINEPHRINE PF 1 MG/ML IJ SOLN
INTRAOCULAR | Status: DC | PRN
  Administered 2018-06-09: 58 mL via OPHTHALMIC

## 2018-06-09 MED ORDER — MOXIFLOXACIN HCL 0.5 % OP SOLN
OPHTHALMIC | Status: AC
Start: 2018-06-09 — End: 2018-06-09
  Administered 2018-06-09: 1 [drp] via OPHTHALMIC
  Filled 2018-06-09: qty 3

## 2018-06-09 MED ORDER — MIDAZOLAM HCL 2 MG/2ML IJ SOLN
INTRAMUSCULAR | Status: AC
  Filled 2018-06-09: qty 2

## 2018-06-09 MED ORDER — CYCLOPENTOLATE HCL 2 % OP SOLN
1.0000 [drp] | OPHTHALMIC | Status: DC
  Administered 2018-06-09: 1 [drp] via OPHTHALMIC

## 2018-06-09 MED ORDER — NEOMYCIN-POLYMYXIN-DEXAMETH 3.5-10000-0.1 OP OINT
TOPICAL_OINTMENT | OPHTHALMIC | Status: AC
  Filled 2018-06-09: qty 3.5

## 2018-06-09 MED ORDER — ONDANSETRON HCL 4 MG/2ML IJ SOLN
INTRAMUSCULAR | Status: AC
  Filled 2018-06-09: qty 2

## 2018-06-09 MED ORDER — MOXIFLOXACIN HCL 0.5 % OP SOLN
OPHTHALMIC | Status: AC
  Filled 2018-06-09: qty 3

## 2018-06-09 MED ORDER — POVIDONE-IODINE 5 % OP SOLN
OPHTHALMIC | Status: AC
  Filled 2018-06-09: qty 30

## 2018-06-09 MED ORDER — CYCLOPENTOLATE HCL 2 % OP SOLN
OPHTHALMIC | Status: AC
  Filled 2018-06-09: qty 2

## 2018-06-09 MED ORDER — MOXIFLOXACIN HCL 0.5 % OP SOLN
OPHTHALMIC | Status: AC
  Administered 2018-06-09: 1 [drp] via OPHTHALMIC
  Filled 2018-06-09: qty 3

## 2018-06-09 SURGICAL SUPPLY — 18 items

## 2018-06-09 NOTE — Anesthesia Postprocedure Evaluation (Signed)
Anesthesia Post Note  Patient: Geraldine Sandberg  Procedure(s) Performed: CATARACT EXTRACTION PHACO AND INTRAOCULAR LENS PLACEMENT ([place]) (Right Eye)  Patient location during evaluation: PACU Anesthesia Type: MAC Level of consciousness: awake and alert and oriented Pain management: pain level controlled Vital Signs Assessment: post-procedure vital signs reviewed and stable Respiratory status: spontaneous breathing, nonlabored ventilation and respiratory function stable Cardiovascular status: blood pressure returned to baseline and stable Postop Assessment: no headache Anesthetic complications: no     Last Vitals:  Vitals:   06/09/18 0750 06/09/18 0928  BP: (!) 149/91 139/83  Pulse: 91 83  Resp: 18 18  Temp: 36.7 C (!) 36.3 C  SpO2: 99% 100%    Last Pain:  Vitals:   06/09/18 0928  TempSrc: Temporal  PainSc: Addison Yoav Okane

## 2018-06-09 NOTE — Transfer of Care (Signed)
Immediate Anesthesia Transfer of Care Note  Patient: Samantha Valentine  Procedure(s) Performed: CATARACT EXTRACTION PHACO AND INTRAOCULAR LENS PLACEMENT (IOC) (Right Eye)  Patient Location: PACU  Anesthesia Type:MAC  Level of Consciousness: awake, alert  and oriented  Airway & Oxygen Therapy: Patient Spontanous Breathing  Post-op Assessment: Report given to RN and Post -op Vital signs reviewed and stable  Post vital signs: stable  Last Vitals:  Vitals Value Taken Time  BP 139/83 06/09/2018  9:28 AM  Temp 36.3 C 06/09/2018  9:28 AM  Pulse 83 06/09/2018  9:28 AM  Resp 18 06/09/2018  9:28 AM  SpO2 100 % 06/09/2018  9:28 AM    Last Pain:  Vitals:   06/09/18 0928  TempSrc: Temporal  PainSc: 5          Complications: No apparent anesthesia complications

## 2018-06-09 NOTE — Anesthesia Post-op Follow-up Note (Signed)
Anesthesia QCDR form completed.        

## 2018-06-09 NOTE — Op Note (Signed)
OPERATIVE NOTE  Samantha Valentine [id number] 06/09/2018   PREOPERATIVE DIAGNOSIS:  Nuclear Sclerotic Cataract Right Eye H25.11   POSTOPERATIVE DIAGNOSIS: Nuclear Sclerotic Cataract Right Eye H25.11          PROCEDURE:  Phacoemusification with posterior chamber intraocular lens placement of the right eye   LENS:   Implant Name Type Inv. Item Serial No. Manufacturer Lot No. LRB No. Used  LENS IOL DIOP 20.0 - [id number] Intraocular Lens LENS IOL DIOP 20.0 [id number] [id number]  Right 1       ULTRASOUND TIME: 16 %  of 0 minutes 55 seconds, CDE 8.2  SURGEON:  Wyonia Hough, MD   ANESTHESIA:  Topical with tetracaine drops and 2% Xylocaine jelly, augmented with 1% preservative-free intracameral lidocaine.    COMPLICATIONS:  None.   DESCRIPTION OF PROCEDURE:  The patient was identified in the holding room and transported to the operating room and placed in the supine position under the operating microscope. Theright eye was identified as the operative eye and it was prepped and draped in the usual sterile ophthalmic fashion.   A 1 millimeter clear-corneal paracentesis was made at the 12:00 position.  0.5 ml of preservative-free 1% lidocaine was injected into the anterior chamber. The anterior chamber was filled with Viscoat viscoelastic.  A 2.4 millimeter keratome was used to make a near-clear corneal incision at the 9:00 position. A curvilinear capsulorrhexis was made with a cystotome and capsulorrhexis forceps.  Balanced salt solution was used to hydrodissect and hydrodelineate the nucleus.   Phacoemulsification was then used in stop and chop fashion to remove the lens nucleus and epinucleus.  The remaining cortex was then removed using the irrigation and aspiration handpiece. Provisc was then placed into the capsular bag to distend it for lens placement.  A lens was then injected into the capsular bag.  The remaining viscoelastic was aspirated.  Wounds were hydrated with balanced  salt solution.  The anterior chamber was inflated to a physiologic pressure with balanced salt solution. Vigamox 0.2 ml of a 1mg  per ml solution was injected into the anterior chamber for a dose of 0.2 mg of intracameral antibiotic at the completion of the case. Miostat was placed into the anterior chamber to constrict the pupil.  No wound leaks were noted.  Topical Vigamox drops and Maxitrol ointment were applied to the eye.  The patient was taken to the recovery room in stable condition without complications of anesthesia or surgery.  [name] 06/09/2018, 9:27 AM

## 2018-06-09 NOTE — Discharge Instructions (Addendum)
Eye Surgery Discharge Instructions  Expect mild scratchy sensation or mild soreness. DO NOT RUB YOUR EYE!  The day of surgery:  Minimal physical activity, but bed rest is not required  No reading, computer work, or close hand work  No bending, lifting, or straining.  May watch TV  For 24 hours:  No driving, legal decisions, or alcoholic beverages  Safety precautions  Eat anything you prefer: It is better to start with liquids, then soup then solid foods.  Solar shield eyeglasses should be worn for comfort in the sunlight/patch while sleeping  Resume all regular medications including aspirin or Coumadin if these were discontinued prior to surgery. You may shower, bathe, shave, or wash your hair. Tylenol may be taken for mild discomfort. FOLLOW EYE DROP INSTRUCTION SHEET AS REVIEWED.  Call your doctor if you experience significant pain, nausea, or vomiting, fever > 101 or other signs of infection. [phone] or [phone] Specific instructions:  Follow-up Information    Leandrew Koyanagi, MD Follow up.   Specialty:  Ophthalmology Why:  August 23 at 9:35am Contact information: [address]   [place] [address] [phone]

## 2018-06-09 NOTE — Anesthesia Preprocedure Evaluation (Signed)
Anesthesia Evaluation  Patient identified by MRN, date of birth, ID band Patient awake    Reviewed: Allergy & Precautions, NPO status , Patient's Chart, lab work & pertinent test results  History of Anesthesia Complications (+) PONV and history of anesthetic complications  Airway Mallampati: I       Dental   Pulmonary neg sleep apnea, COPD, former smoker,           Cardiovascular (-) hypertension(-) Past MI and (-) CHF (-) dysrhythmias (-) Valvular Problems/Murmurs     Neuro/Psych neg Seizures Anxiety Depression    GI/Hepatic Neg liver ROS, hiatal hernia, GERD  ,  Endo/Other  neg diabetesHypothyroidism   Renal/GU Renal disease (stones)     Musculoskeletal   Abdominal   Peds  Hematology   Anesthesia Other Findings   Reproductive/Obstetrics                             Anesthesia Physical Anesthesia Plan  ASA: III  Anesthesia Plan: MAC   Post-op Pain Management:    Induction:   PONV Risk Score and Plan:   Airway Management Planned:   Additional Equipment:   Intra-op Plan:   Post-operative Plan:   Informed Consent: I have reviewed the patients History and Physical, chart, labs and discussed the procedure including the risks, benefits and alternatives for the proposed anesthesia with the patient or authorized representative who has indicated his/her understanding and acceptance.     Plan Discussed with:   Anesthesia Plan Comments:         Anesthesia Quick Evaluation

## 2018-06-09 NOTE — H&P (Signed)
The History and Physical notes are on paper, have been signed, and are to be scanned. The patient remains stable and unchanged from the H&P.   Previous H&P reviewed, patient examined, and there are no changes.  Harish Bram 06/09/2018 8:03 AM

## 2018-06-10 ENCOUNTER — Encounter: Payer: Self-pay | Admitting: Ophthalmology

## 2018-06-15 NOTE — Progress Notes (Signed)
Office Visit Note  Patient: Samantha Valentine             Date of Birth: [birth year]           MRN: [id number]             PCP: Abner Greenspan, MD Referring: Tower, Wynelle Fanny, MD Visit Date: 06/28/2018 Occupation: @GUAROCC @  Subjective:  Pain in thoracic spine.   History of Present Illness: Samantha Valentine is a 72 y.o. female with history of seropositive rheumatoid arthritis, osteoarthritis, DDD and fibromyalgia.  She states her rheumatoid arthritis quite well controlled on Enbrel.  She has some off-and-on swelling in her ankles.  She has been having severe pain and discomfort in the thoracic region which sometimes go between her sternal blades.  She has known history of DDD of thoracic spine with spinal stenosis.  The neck and lower back pain is tolerable currently.  Activities of Daily Living:  Patient reports morning stiffness for 30 minutes.   Patient Reports nocturnal pain.  Difficulty dressing/grooming: Denies Difficulty climbing stairs: Reports Difficulty getting out of chair: Reports Difficulty using hands for taps, buttons, cutlery, and/or writing: Denies  Review of Systems  Constitutional: Positive for fatigue. Negative for night sweats, weight gain and weight loss.  HENT: Negative for mouth sores, trouble swallowing, trouble swallowing, mouth dryness and nose dryness.   Eyes: Negative for pain, redness, visual disturbance and dryness.  Respiratory: Negative for cough, shortness of breath and difficulty breathing.   Cardiovascular: Negative for chest pain, palpitations, hypertension, irregular heartbeat and swelling in legs/feet.  Gastrointestinal: Negative for blood in stool, constipation and diarrhea.  Endocrine: Negative for increased urination.  Genitourinary: Negative for vaginal dryness.  Musculoskeletal: Positive for arthralgias, joint pain and morning stiffness. Negative for joint swelling, myalgias, muscle weakness, muscle tenderness and myalgias.  Skin:  Negative for color change, rash, hair loss, skin tightness, ulcers and sensitivity to sunlight.  Allergic/Immunologic: Negative for susceptible to infections.  Neurological: Negative for dizziness, memory loss, night sweats and weakness.  Hematological: Negative for swollen glands.  Psychiatric/Behavioral: Positive for sleep disturbance. Negative for depressed mood. The patient is not nervous/anxious.     PMFS History:  Patient Active Problem List   Diagnosis Date Noted  . DDD (degenerative disc disease), thoracic 06/28/2018  . DDD (degenerative disc disease), cervical 06/28/2018  . UTI (urinary tract infection) 03/11/2018  . Atherosclerosis of aorta ([place]) 03/11/2018  . Sleep disturbance 06/18/2017  . Tachycardia 05/25/2017  . Dysuria 05/25/2017  . Estrogen deficiency 03/22/2017  . Malignant neoplasm of upper-outer quadrant of left breast in female, estrogen receptor negative ([place]) 02/04/2017  . Tendinopathy of right shoulder 01/25/2017  . High risk medication use 09/29/2016  . DJD (degenerative joint disease), cervical 09/29/2016  . History of right hip replacement 09/29/2016  . Eczema 07/13/2014  . Adverse effect of immunosuppressive drug 04/27/2012  . ANXIETY DEPRESSION 10/28/2008  . Spinal stenosis 12/29/2007  . Hypothyroidism 12/28/2007  . Vitamin D deficiency 12/28/2007  . HEARING LOSS 12/28/2007  . Seropositive rheumatoid arthritis of multiple sites ([place]) 12/28/2007  . DDD (degenerative disc disease), lumbar 12/28/2007  . Fibromyalgia 12/28/2007  . Osteoporosis 12/28/2007  . MIGRAINES, HX OF 12/28/2007    Past Medical History:  Diagnosis Date  . Anxiety   . Arthritis    RA  . Breast cancer ([place]) 01/29/2017  . Breast mass 1 year   . Cancer ([place]) 01/29/2017   left breast INVASIVE MAMMARY CARCINOMA, ER/PR positive  . Collagen  vascular disease ([place])    Rhematoid Arthritis  . Complication of anesthesia   . DDD (degenerative disc disease)    in neck  . Depression     . Dyspnea   . Edema    FEET/LEGS  . Emphysema of lung ([place])   . Fibromyalgia   . GERD (gastroesophageal reflux disease)    NO MEDS  . History of hiatal hernia   . History of kidney stones    MULTIPLE KIDNEY STONES BIL  . HOH (hard of hearing)    AIDS  . Hypothyroidism   . Migraine   . Osteoporosis   . Palpitations   . Personal history of chemotherapy    prior to mastectomy  . PONV (postoperative nausea and vomiting)    AFTER FIRST CATARACT  . Rheumatoid arthritis ([place])     Family History  Problem Relation Age of Onset  . Alcohol abuse Father   . Diabetes Father   . Stroke Father   . Hypertension Mother   . Endometrial cancer Mother   . Osteoarthritis Mother   . Breast cancer Paternal Aunt 39  . Liver disease Sister   . Breast cancer Maternal Aunt   . Rheum arthritis Maternal Grandmother   . Diabetes Paternal Grandmother   . Parkinson's disease Paternal Grandfather    Past Surgical History:  Procedure Laterality Date  . BREAST BIOPSY Left 01/29/2017   US guided biopsy INVASIVE MAMMARY CARCINOMA  . CATARACT EXTRACTION W/PHACO Left 04/28/2018   Procedure: CATARACT EXTRACTION PHACO AND INTRAOCULAR LENS PLACEMENT (IOC);  Surgeon: Leandrew Koyanagi, MD;  Location: [facility] ORS;  Service: Ophthalmology;  Laterality: Left;  Lot # [id number]   1:03 AP%   13.4 CDE    8.40  . CATARACT EXTRACTION W/PHACO Right 06/09/2018   Procedure: CATARACT EXTRACTION PHACO AND INTRAOCULAR LENS PLACEMENT (IOC);  Surgeon: Leandrew Koyanagi, MD;  Location: [facility] ORS;  Service: Ophthalmology;  Laterality: Right;  lot# [id number] [place] 0:55 ap 16.3% cde 8.21  . HIP ARTHROPLASTY    . JOINT REPLACEMENT Right 2003   total hip replacement  . LITHOTRIPSY  1997   kidney stone  . MASTECTOMY Left 01/29/2017  . MASTECTOMY W/ SENTINEL NODE BIOPSY Left 05/18/2017   Procedure: MASTECTOMY WITH SENTINEL LYMPH NODE BIOPSY;  Surgeon: Robert Bellow, MD;  Location: [facility] ORS;  Service: General;  Laterality:  Left;  . PORT-A-CATH REMOVAL  09/2017  . PORTACATH PLACEMENT Right 02/15/2017   Procedure: INSERTION PORT-A-CATH;  Surgeon: Robert Bellow, MD;  Location: [facility] ORS;  Service: General;  Laterality: Right;  . TONSILLECTOMY  1970   Social History   Social History Narrative  . Not on file    Objective: Vital Signs: BP (!) 161/95 (BP Location: Right Arm, Patient Position: Sitting, Cuff Size: Normal)   Pulse 90   Resp 14   Ht 5\' 4"  (1.626 m)   Wt 172 lb 9.6 oz (78.3 kg)   BMI 29.63 kg/m    Physical Exam  Constitutional: She is oriented to person, place, and time. She appears well-developed and well-nourished.  HENT:  Head: Normocephalic and atraumatic.  Eyes: Conjunctivae and EOM are normal.  Neck: Normal range of motion.  Cardiovascular: Normal rate, regular rhythm, normal heart sounds and intact distal pulses.  Pulmonary/Chest: Effort normal and breath sounds normal.  Abdominal: Soft. Bowel sounds are normal.  Lymphadenopathy:    She has no cervical adenopathy.  Neurological: She is alert and oriented to person, place, and time.  Skin: Skin is warm  and dry. Capillary refill takes less than 2 seconds.  Psychiatric: She has a normal mood and affect. Her behavior is normal.  Nursing note and vitals reviewed.    Musculoskeletal Exam: C-spine thoracic lumbar spine limited range of motion with discomfort.  Shoulder joints were in good range of motion but she had a lot of discomfort range of motion of her left shoulder.  Elbow joints wrist joints were in good range of motion.  She has DIP and PIP thickening but no synovitis.  Hip joints knee joints ankles MTPs PIPs were in good range of motion with no synovitis.  She has some generalized hyperalgesia.  CDAI Exam: CDAI Score: 0.4  Patient Global Assessment: 2 (mm); Provider Global Assessment: 2 (mm) Swollen: 0 ; Tender: 0  Joint Exam   Not documented   There is currently no information documented on the homunculus. Go to the  Rheumatology activity and complete the homunculus joint exam.  Investigation: No additional findings.  Imaging: No results found.  Recent Labs: Lab Results  Component Value Date   WBC 7.0 05/13/2018   HGB 14.0 05/13/2018   PLT 234 05/13/2018   NA 140 05/13/2018   K 4.3 05/13/2018   CL 106 05/13/2018   CO2 26 05/13/2018   GLUCOSE 95 05/13/2018   BUN 17 05/13/2018   CREATININE 0.92 05/13/2018   BILITOT 0.3 05/13/2018   ALKPHOS 80 07/22/2017   AST 14 05/13/2018   ALT 10 05/13/2018   PROT 6.7 05/13/2018   ALBUMIN 3.7 07/22/2017   CALCIUM 9.2 05/13/2018   GFRAA 73 05/13/2018   QFTBGOLD NEGATIVE 09/03/2017    Speciality Comments: No specialty comments available.  Procedures:  Large Joint Inj: L glenohumeral on 06/28/2018 2:20 PM Indications: pain Details: 27 G 1.5 in needle, posterior approach  Arthrogram: No  Medications: 1 mL lidocaine 1 %; 40 mg triamcinolone acetonide 40 MG/ML Aspirate: 0 mL Outcome: tolerated well, no immediate complications Procedure, treatment alternatives, risks and benefits explained, specific risks discussed. Consent was given by the patient. Immediately prior to procedure a time out was called to verify the correct patient, procedure, equipment, support staff and site/side marked as required. Patient was prepped and draped in the usual sterile fashion.     Allergies: Adhesive [tape]; Hydroxychloroquine sulfate; Ibandronate sodium; and Risedronate sodium   Assessment / Plan:     Visit Diagnoses: Seropositive rheumatoid arthritis of multiple sites ([place]) - Positive RF with erosive disease.  Patient has no synovitis on examination.  High risk medication use - Enbrel 50 mg sq q wk, Discontinued SSZ due constipation.  Labs are stable.  She will get labs in October and then every 3 months to monitor for drug toxicity.  Acute pain of left shoulder-she has been having a lot of discomfort and nocturnal pain.  She is unable to sleep at night.  Per her  request there is left shoulder joint was injected with cortisone as described above.  Primary osteoarthritis of both hands-joint protection muscle strengthening was discussed.  History of right hip replacement-doing well  DDD (degenerative disc disease), cervical-she is currently not having much discomfort.  DDD (degenerative disc disease), thoracic-she has significant disease and chronic pain.  She is also having some nocturnal pain.  She is unable to do much exercise due to arthritis in multiple joints.  I will refer her to water therapy.  DDD (degenerative disc disease), lumbar-chronic pain  Fibromyalgia-she continues to have discomfort.  Age-related osteoporosis without current pathological fracture - DEXA scan on  05/06/17 showed T score of -2.4.  She has been treated with Forteo in the past.  She is currently on Prolia which was a started by Dr. Glori Bickers.  Malignant neoplasm of upper-outer quadrant of left breast in female, estrogen receptor negative ([place]) - Patient recently stopped anastrozole due to increase myalgias and arthralgias. She also stopped her chemotherapy.   History of anxiety - She takes Ativan PRN.   History of hearing loss  History of migraine  History of depression  History of vitamin D deficiency  History of hypothyroidism   Parking placard was filled per her request.  Orders: Orders Placed This Encounter  Procedures  . Large Joint Inj   No orders of the defined types were placed in this encounter.   Face-to-face time spent with patient was 30 minutes. Greater than 50% of time was spent in counseling and coordination of care.  Follow-Up Instructions: Return in about 5 months (around 11/28/2018) for Rheumatoid arthritis, Osteoarthritis.   Bo Merino, MD  Note - This record has been created using Editor, commissioning.  Chart creation errors have been sought, but may not always  have been located. Such creation errors do not reflect on  the standard  of medical care.

## 2018-06-28 ENCOUNTER — Encounter: Payer: Self-pay | Admitting: Rheumatology

## 2018-06-28 ENCOUNTER — Telehealth: Payer: Self-pay | Admitting: Pharmacy Technician

## 2018-06-28 ENCOUNTER — Ambulatory Visit: Payer: PPO | Admitting: Rheumatology

## 2018-06-28 VITALS — BP 139/84 | HR 84 | Resp 14 | Ht 64.0 in | Wt 172.6 lb

## 2018-06-28 DIAGNOSIS — M810 Age-related osteoporosis without current pathological fracture: Secondary | ICD-10-CM | POA: Diagnosis not present

## 2018-06-28 DIAGNOSIS — M25512 Pain in left shoulder: Secondary | ICD-10-CM | POA: Diagnosis not present

## 2018-06-28 DIAGNOSIS — C50412 Malignant neoplasm of upper-outer quadrant of left female breast: Secondary | ICD-10-CM | POA: Diagnosis not present

## 2018-06-28 DIAGNOSIS — Z8639 Personal history of other endocrine, nutritional and metabolic disease: Secondary | ICD-10-CM

## 2018-06-28 DIAGNOSIS — Z8659 Personal history of other mental and behavioral disorders: Secondary | ICD-10-CM

## 2018-06-28 DIAGNOSIS — M5030 Other cervical disc degeneration, unspecified cervical region: Secondary | ICD-10-CM

## 2018-06-28 DIAGNOSIS — Z171 Estrogen receptor negative status [ER-]: Secondary | ICD-10-CM

## 2018-06-28 DIAGNOSIS — M5134 Other intervertebral disc degeneration, thoracic region: Secondary | ICD-10-CM

## 2018-06-28 DIAGNOSIS — M19042 Primary osteoarthritis, left hand: Secondary | ICD-10-CM

## 2018-06-28 DIAGNOSIS — Z8669 Personal history of other diseases of the nervous system and sense organs: Secondary | ICD-10-CM

## 2018-06-28 DIAGNOSIS — M19041 Primary osteoarthritis, right hand: Secondary | ICD-10-CM

## 2018-06-28 DIAGNOSIS — M797 Fibromyalgia: Secondary | ICD-10-CM | POA: Diagnosis not present

## 2018-06-28 DIAGNOSIS — Z96641 Presence of right artificial hip joint: Secondary | ICD-10-CM | POA: Diagnosis not present

## 2018-06-28 DIAGNOSIS — M5136 Other intervertebral disc degeneration, lumbar region: Secondary | ICD-10-CM

## 2018-06-28 DIAGNOSIS — Z79899 Other long term (current) drug therapy: Secondary | ICD-10-CM | POA: Diagnosis not present

## 2018-06-28 DIAGNOSIS — M0579 Rheumatoid arthritis with rheumatoid factor of multiple sites without organ or systems involvement: Secondary | ICD-10-CM | POA: Diagnosis not present

## 2018-06-28 DIAGNOSIS — M5135 Other intervertebral disc degeneration, thoracolumbar region: Secondary | ICD-10-CM | POA: Insufficient documentation

## 2018-06-28 MED ORDER — LIDOCAINE HCL 1 % IJ SOLN
1.0000 mL | INTRAMUSCULAR | Status: AC | PRN
  Administered 2018-06-28: 1 mL

## 2018-06-28 MED ORDER — TRIAMCINOLONE ACETONIDE 40 MG/ML IJ SUSP
40.0000 mg | INTRAMUSCULAR | Status: AC | PRN
  Administered 2018-06-28: 40 mg via INTRA_ARTICULAR

## 2018-06-28 NOTE — Telephone Encounter (Signed)
Spoke to patient today about [employer] renewals. Patient's been approved through 10/18/18. Called [employer], was advised they use the same forms for renewals and renewal applications can start being submitted after 07/20/18. Patient signed forms, will submit on 07/20/18.  2:38 PM Beatriz Chancellor, CPhT

## 2018-06-28 NOTE — Patient Instructions (Signed)
Standing Labs We placed an order today for your standing lab work.   Please come back and get your standing labs in October and every 3 months   We have open lab Monday through Friday from 8:30-11:30 AM and 1:30-4:00 PM  at the office of Dr. Somaly Marteney.   You may experience shorter wait times on Monday and Friday afternoons. The office is located at [address], [address], [place], [place] [address] No appointment is necessary.   Labs are drawn by [facility].  You may receive a bill from [facility] for your lab work. If you have any questions regarding directions or hours of operation,  please call [phone].    

## 2018-07-20 NOTE — Telephone Encounter (Signed)
Faxed Renewal Application to [employer]. Awaiting response.  Will send document to scan center.  Phone# [phone] Fax# [phone]  1:43 PM Beatriz Chancellor, CPhT

## 2018-08-08 ENCOUNTER — Encounter: Payer: Self-pay | Admitting: Family Medicine

## 2018-08-08 NOTE — Telephone Encounter (Signed)
Received a Prior Authorization request from [facility] for Enbrel. Authorization has been submitted to patient's insurance via Cover My Meds. Will update once we receive a response.  Received response that medication is available without authorization. Faxed to [facility].  8:51 AM Beatriz Chancellor, CPhT

## 2018-08-09 ENCOUNTER — Telehealth: Payer: Self-pay | Admitting: Family Medicine

## 2018-08-09 DIAGNOSIS — E559 Vitamin D deficiency, unspecified: Secondary | ICD-10-CM

## 2018-08-09 DIAGNOSIS — T451X5S Adverse effect of antineoplastic and immunosuppressive drugs, sequela: Secondary | ICD-10-CM

## 2018-08-09 DIAGNOSIS — M810 Age-related osteoporosis without current pathological fracture: Secondary | ICD-10-CM

## 2018-08-09 DIAGNOSIS — Z0000 Encounter for general adult medical examination without abnormal findings: Secondary | ICD-10-CM

## 2018-08-09 DIAGNOSIS — E039 Hypothyroidism, unspecified: Secondary | ICD-10-CM

## 2018-08-09 NOTE — Telephone Encounter (Signed)
I spoke with pt to set up part 1 AWV with Sharrell Ku prior to her part 2. She is requesting labs that are due by Dr. Estanislado Pandy. She is requesting CMP, CBC and TSH labs at the 11/15 AWV visit so she does not have to have blood taken twice within the week. Would you be able to put in orders?

## 2018-08-09 NOTE — Telephone Encounter (Signed)
No problem I do those anyway I put the orders in  Will CC Katha Cabal so she knows I did that Thanks

## 2018-08-19 ENCOUNTER — Other Ambulatory Visit: Payer: Self-pay

## 2018-08-19 DIAGNOSIS — Z79899 Other long term (current) drug therapy: Secondary | ICD-10-CM

## 2018-08-20 LAB — CBC WITH DIFFERENTIAL/PLATELET
Basophils Absolute: 40 cells/uL (ref 0–200)
Basophils Relative: 0.6 %
EOS ABS: 127 {cells}/uL (ref 15–500)
Eosinophils Relative: 1.9 %
HCT: 41.8 % (ref 35.0–45.0)
HEMOGLOBIN: 14.4 g/dL (ref 11.7–15.5)
Lymphs Abs: 1722 cells/uL (ref 850–3900)
MCH: 31.9 pg (ref 27.0–33.0)
MCHC: 34.4 g/dL (ref 32.0–36.0)
MCV: 92.5 fL (ref 80.0–100.0)
MONOS PCT: 9.4 %
MPV: 11.5 fL (ref 7.5–12.5)
Neutro Abs: 4181 cells/uL (ref 1500–7800)
Neutrophils Relative %: 62.4 %
PLATELETS: 225 10*3/uL (ref 140–400)
RBC: 4.52 10*6/uL (ref 3.80–5.10)
RDW: 12.7 % (ref 11.0–15.0)
TOTAL LYMPHOCYTE: 25.7 %
WBC: 6.7 10*3/uL (ref 3.8–10.8)
WBCMIX: 630 {cells}/uL (ref 200–950)

## 2018-08-20 LAB — COMPLETE METABOLIC PANEL WITH GFR
AG RATIO: 1.6 (calc) (ref 1.0–2.5)
ALBUMIN MSPROF: 3.8 g/dL (ref 3.6–5.1)
ALT: 12 U/L (ref 6–29)
AST: 15 U/L (ref 10–35)
Alkaline phosphatase (APISO): 53 U/L (ref 33–130)
BUN: 16 mg/dL (ref 7–25)
CALCIUM: 8.8 mg/dL (ref 8.6–10.4)
CO2: 26 mmol/L (ref 20–32)
CREATININE: 0.76 mg/dL (ref 0.60–0.93)
Chloride: 106 mmol/L (ref 98–110)
GFR, EST AFRICAN AMERICAN: 92 mL/min/{1.73_m2} (ref >=60)
GFR, EST NON AFRICAN AMERICAN: 79 mL/min/{1.73_m2} (ref >=60)
GLOBULIN: 2.4 g/dL (ref 1.9–3.7)
Glucose, Bld: 87 mg/dL (ref 65–99)
POTASSIUM: 4.5 mmol/L (ref 3.5–5.3)
SODIUM: 139 mmol/L (ref 135–146)
TOTAL PROTEIN: 6.2 g/dL (ref 6.1–8.1)
Total Bilirubin: 0.4 mg/dL (ref 0.2–1.2)

## 2018-08-20 LAB — QUANTIFERON-TB GOLD PLUS
Mitogen-NIL: >10 IU/mL
NIL: 0.02 [IU]/mL
QUANTIFERON-TB GOLD PLUS: NEGATIVE
TB1-NIL: 0.02 IU/mL
TB2-NIL: 0.02 [IU]/mL

## 2018-08-22 ENCOUNTER — Encounter: Payer: Self-pay | Admitting: Rheumatology

## 2018-08-24 MED ORDER — ETANERCEPT 50 MG/ML ~~LOC~~ SOSY: 50.0000 mg | PREFILLED_SYRINGE | SUBCUTANEOUS | 0 refills | Status: DC

## 2018-08-24 NOTE — Telephone Encounter (Signed)
Last Visit: 06/28/18 Next Visit: 12/06/18 Labs: 08/19/18 wnl TB Gold: 08/19/18 Neg   Okay to refill per Dr. Estanislado Pandy  Faxed to [employer]

## 2018-09-02 ENCOUNTER — Ambulatory Visit: Payer: PPO

## 2018-09-06 ENCOUNTER — Encounter: Payer: PPO | Admitting: Family Medicine

## 2018-09-07 ENCOUNTER — Encounter: Payer: PPO | Admitting: Family Medicine

## 2018-09-07 ENCOUNTER — Telehealth: Payer: Self-pay | Admitting: *Deleted

## 2018-09-07 NOTE — Telephone Encounter (Signed)
Spoke with Louie Casa at [employer]. She states the prescription that came with renewal application was for Enbrel PFS and patient has a history of using the Enbrel Sureclick. Clarified prescription should be Sureclick.

## 2018-09-08 ENCOUNTER — Telehealth: Payer: Self-pay | Admitting: Rheumatology

## 2018-09-08 NOTE — Telephone Encounter (Addendum)
Needs refill on Enbrel 50mg  sure click sent in. Patient is out completely. Samantha Valentine would really like this to be taken care of soon for patient. According to his records, this is the second time they have reached out to [place] for our patient. Fax# [phone].

## 2018-09-08 NOTE — Telephone Encounter (Signed)
Spoke with [employer] and they have the prescription. The prescription was received 08/24/18.

## 2018-09-19 ENCOUNTER — Telehealth: Payer: Self-pay | Admitting: Pharmacy Technician

## 2018-09-19 NOTE — Telephone Encounter (Signed)
Received a fax from [employer] regarding a prior authorization for Enbrel. Authorization has been APPROVED from 07/16/15 to 10/18/21.   Will send document to scan center.  Phone # [phone]  10:59 AM Beatriz Chancellor, CPhT

## 2018-09-21 NOTE — Telephone Encounter (Signed)
Received fax from [employer], renewal application has been APPROVED. Coverage is from 10/19/18 to 10/19/19.  Will send document to scan Center.  Phone# [phone] Fax# [phone]  8:14 AM Beatriz Chancellor, CPhT

## 2018-10-17 ENCOUNTER — Other Ambulatory Visit: Payer: Self-pay | Admitting: Family Medicine

## 2018-11-14 ENCOUNTER — Ambulatory Visit (INDEPENDENT_AMBULATORY_CARE_PROVIDER_SITE_OTHER): Payer: PPO

## 2018-11-14 VITALS — BP 110/74 | HR 87 | Temp 97.7°F | Ht 65.0 in | Wt 181.8 lb

## 2018-11-14 DIAGNOSIS — E559 Vitamin D deficiency, unspecified: Secondary | ICD-10-CM

## 2018-11-14 DIAGNOSIS — T451X5S Adverse effect of antineoplastic and immunosuppressive drugs, sequela: Secondary | ICD-10-CM | POA: Diagnosis not present

## 2018-11-14 DIAGNOSIS — Z0000 Encounter for general adult medical examination without abnormal findings: Secondary | ICD-10-CM

## 2018-11-14 DIAGNOSIS — E039 Hypothyroidism, unspecified: Secondary | ICD-10-CM | POA: Diagnosis not present

## 2018-11-14 LAB — COMPREHENSIVE METABOLIC PANEL
ALBUMIN: 3.9 g/dL (ref 3.5–5.2)
ALK PHOS: 63 U/L (ref 39–117)
ALT: 11 U/L (ref 0–35)
AST: 15 U/L (ref 0–37)
BILIRUBIN TOTAL: 0.5 mg/dL (ref 0.2–1.2)
BUN: 14 mg/dL (ref 6–23)
CALCIUM: 9.1 mg/dL (ref 8.4–10.5)
CO2: 26 mEq/L (ref 19–32)
Chloride: 102 mEq/L (ref 96–112)
Creatinine, Ser: 0.72 mg/dL (ref 0.40–1.20)
GFR: 79.85 mL/min (ref >60.00)
GLUCOSE: 102 mg/dL — AB (ref 70–99)
POTASSIUM: 4.6 meq/L (ref 3.5–5.1)
Sodium: 137 mEq/L (ref 135–145)
TOTAL PROTEIN: 6.9 g/dL (ref 6.0–8.3)

## 2018-11-14 LAB — VITAMIN D 25 HYDROXY (VIT D DEFICIENCY, FRACTURES): VITD: 33 ng/mL (ref 30.00–100.00)

## 2018-11-14 LAB — CBC WITH DIFFERENTIAL/PLATELET
Basophils Absolute: 0 10*3/uL (ref 0.0–0.1)
Basophils Relative: 0.7 % (ref 0.0–3.0)
Eosinophils Absolute: 0.1 10*3/uL (ref 0.0–0.7)
Eosinophils Relative: 2.4 % (ref 0.0–5.0)
HEMATOCRIT: 44.7 % (ref 36.0–46.0)
HEMOGLOBIN: 14.9 g/dL (ref 12.0–15.0)
LYMPHS ABS: 1.7 10*3/uL (ref 0.7–4.0)
LYMPHS PCT: 32.9 % (ref 12.0–46.0)
MCHC: 33.2 g/dL (ref 30.0–36.0)
MCV: 93.5 fl (ref 78.0–100.0)
MONOS PCT: 9.6 % (ref 3.0–12.0)
Monocytes Absolute: 0.5 10*3/uL (ref 0.1–1.0)
NEUTROS PCT: 54.4 % (ref 43.0–77.0)
Neutro Abs: 2.7 10*3/uL (ref 1.4–7.7)
Platelets: 206 10*3/uL (ref 150.0–400.0)
RBC: 4.78 Mil/uL (ref 3.87–5.11)
RDW: 13.3 % (ref 11.5–15.5)
WBC: 5.1 10*3/uL (ref 4.0–10.5)

## 2018-11-14 LAB — LIPID PANEL
Cholesterol: 196 mg/dL (ref 0–200)
HDL: 68.8 mg/dL (ref >39.00)
LDL Cholesterol: 112 mg/dL — ABNORMAL HIGH (ref 0–99)
NONHDL: 127.54
Total CHOL/HDL Ratio: 3
Triglycerides: 76 mg/dL (ref 0.0–149.0)
VLDL: 15.2 mg/dL (ref 0.0–40.0)

## 2018-11-14 LAB — TSH: TSH: 7.15 u[IU]/mL — AB (ref 0.35–4.50)

## 2018-11-14 NOTE — Patient Instructions (Signed)
Ms. Odonohue , Thank you for taking time to come for your Medicare Wellness Visit. I appreciate your ongoing commitment to your health goals. Please review the following plan we discussed and let me know if I can assist you in the future.   These are the goals we discussed: Goals    . Patient Stated     Starting 11/14/18, I will continue to take medications as prescribed.        This is a list of the screening recommended for you and due dates:  Health Maintenance  Topic Date Due  . Tetanus Vaccine  10/18/2020*  .  Hepatitis C: One time screening is recommended by Center for Disease Control  (CDC) for  adults born from [birth year] through 1963.   11/20/2020*  . Colon Cancer Screening  11/21/2023*  . Mammogram  01/14/2019  . Flu Shot  Completed  . DEXA scan (bone density measurement)  Completed  . Pneumonia vaccines  Completed  *Topic was postponed. The date shown is not the original due date.   Preventive Care for Adults  A healthy lifestyle and preventive care can promote health and wellness. Preventive health guidelines for adults include the following key practices.  . A routine yearly physical is a good way to check with your health care provider about your health and preventive screening. It is a chance to share any concerns and updates on your health and to receive a thorough exam.  . Visit your dentist for a routine exam and preventive care every 6 months. Brush your teeth twice a day and floss once a day. Good oral hygiene prevents tooth decay and gum disease.  . The frequency of eye exams is based on your age, health, family medical history, use  of contact lenses, and other factors. Follow your health care provider's recommendations for frequency of eye exams.  . Eat a healthy diet. Foods like vegetables, fruits, whole grains, low-fat dairy products, and lean protein foods contain the nutrients you need without too many calories. Decrease your intake of foods high in solid fats,  added sugars, and salt. Eat the right amount of calories for you. Get information about a proper diet from your health care provider, if necessary.  . Regular physical exercise is one of the most important things you can do for your health. Most adults should get at least 150 minutes of moderate-intensity exercise (any activity that increases your heart rate and causes you to sweat) each week. In addition, most adults need muscle-strengthening exercises on 2 or more days a week.  Silver Sneakers may be a benefit available to you. To determine eligibility, you may visit the website: www.silversneakers.com or contact program at 646-062-2115 Mon-Fri between 8AM-8PM.   . Maintain a healthy weight. The body mass index (BMI) is a screening tool to identify possible weight problems. It provides an estimate of body fat based on height and weight. Your health care provider can find your BMI and can help you achieve or maintain a healthy weight.   For adults 20 years and older: ? A BMI below 18.5 is considered underweight. ? A BMI of 18.5 to 24.9 is normal. ? A BMI of 25 to 29.9 is considered overweight. ? A BMI of 30 and above is considered obese.   . Maintain normal blood lipids and cholesterol levels by exercising and minimizing your intake of saturated fat. Eat a balanced diet with plenty of fruit and vegetables. Blood tests for lipids and cholesterol should begin at  age 41 and be repeated every 5 years. If your lipid or cholesterol levels are high, you are over 50, or you are at high risk for heart disease, you may need your cholesterol levels checked more frequently. Ongoing high lipid and cholesterol levels should be treated with medicines if diet and exercise are not working.  . If you smoke, find out from your health care provider how to quit. If you do not use tobacco, please do not start.  . If you choose to drink alcohol, please do not consume more than 2 drinks per day. One drink is  considered to be 12 ounces (355 mL) of beer, 5 ounces (148 mL) of wine, or 1.5 ounces (44 mL) of liquor.  . If you are 61-37 years old, ask your health care provider if you should take aspirin to prevent strokes.  . Use sunscreen. Apply sunscreen liberally and repeatedly throughout the day. You should seek shade when your shadow is shorter than you. Protect yourself by wearing long sleeves, pants, a wide-brimmed hat, and sunglasses year round, whenever you are outdoors.  . Once a month, do a whole body skin exam, using a mirror to look at the skin on your back. Tell your health care provider of new moles, moles that have irregular borders, moles that are larger than a pencil eraser, or moles that have changed in shape or color.

## 2018-11-14 NOTE — Progress Notes (Signed)
PCP notes:   Health maintenance:  Tetanus vaccine - postponed/insurance  Abnormal screenings:   None  Patient concerns:   Patient wants to discuss increase in pain in right hip since taking Prolia.  Nurse concerns:  None  Next PCP appt:   11/23/18 @ 0900  I reviewed health advisor's note, was available for consultation, and agree with documentation and plan. Loura Pardon MD

## 2018-11-14 NOTE — Progress Notes (Signed)
Subjective:   Samantha Valentine is a 73 y.o. female who presents for an Initial Medicare Annual Wellness Visit.  Review of Systems    N/A  Cardiac Risk Factors include: advanced age (>103men, >22 women);obesity (BMI >30kg/m2)     Objective:    Today's Vitals   11/14/18 0838 11/14/18 0937  BP: 110/74   Pulse: 87   Temp: 97.7 F (36.5 C)   TempSrc: Oral   SpO2: 96%   Weight: 181 lb 12 oz (82.4 kg)   Height: 5\' 5"  (1.651 m)   PainSc: 1  1   PainLoc: Generalized    Body mass index is 30.24 kg/m.  Advanced Directives 11/14/2018 06/09/2018 04/28/2018 05/26/2017 05/18/2017 03/24/2017 03/10/2017  Does Patient Have a Medical Advance Directive? Yes Yes Yes Yes Yes Yes Yes  Type of Paramedic of [place];Living will Living will;Healthcare Power of [place];Living will Healthcare Power of [place] of [place] of [place]  Does patient want to make changes to medical advance directive? - No - Patient declined No - Patient declined No - Patient declined No - Patient declined - No - Patient declined  Copy of [place] in Chart? No - copy requested Yes No - copy requested Yes No - copy requested No - copy requested Yes  Would patient like information on creating a medical advance directive? - - - No - Patient declined - - No - Patient declined    Current Medications (verified) Outpatient Encounter Medications as of 11/14/2018  Medication Sig  . aspirin EC 81 MG tablet Take 81 mg by mouth daily.  . celecoxib (CELEBREX) 200 MG capsule 1 capsule by mouth twice a day when necessary with meals (Patient taking differently: Take 200 mg by mouth 2 (two) times daily as needed for moderate pain. 1 capsule by mouth twice a day when necessary with meal)  . cholecalciferol (VITAMIN D) 1000 units tablet Take 1,000 Units by mouth daily.  Marland Kitchen etanercept (ENBREL) 50 MG/ML injection  Inject 0.98 mLs (50 mg total) into the skin once a week. SUNDAYS  . levothyroxine (SYNTHROID, LEVOTHROID) 125 MCG tablet Take 1 tablet (125 mcg total) by mouth daily before breakfast.  . LORazepam (ATIVAN) 1 MG tablet Take 1 tablet (1 mg total) by mouth at bedtime as needed for anxiety.  . [DISCONTINUED] DULoxetine (CYMBALTA) 30 MG capsule TAKE 1 CAPSULE BY MOUTH ONCE DAILY   No facility-administered encounter medications on file as of 11/14/2018.     Allergies (verified) Adhesive [tape]; Hydroxychloroquine sulfate; Ibandronate sodium; and Risedronate sodium   History: Past Medical History:  Diagnosis Date  . Anxiety   . Arthritis    RA  . Breast cancer ([place]) 01/29/2017  . Breast mass 1 year   . Cancer ([place]) 01/29/2017   left breast INVASIVE MAMMARY CARCINOMA, ER/PR positive  . Cataract 2019   resolved with surgery  . Collagen vascular disease ([facility])    Rhematoid Arthritis  . Complication of anesthesia   . DDD (degenerative disc disease)    in neck  . Depression   . Dyspnea   . Edema    FEET/LEGS  . Emphysema of lung ([place])   . Fibromyalgia   . GERD (gastroesophageal reflux disease)    NO MEDS  . History of hiatal hernia   . History of kidney stones    MULTIPLE KIDNEY STONES BIL  . HOH (hard of hearing)  AIDS  . Hypothyroidism   . Macular degeneration, bilateral   . Migraine   . Osteoporosis   . Palpitations   . Personal history of chemotherapy    prior to mastectomy  . PONV (postoperative nausea and vomiting)    AFTER FIRST CATARACT  . Rheumatoid arthritis [facility])    Past Surgical History:  Procedure Laterality Date  . BREAST BIOPSY Left 01/29/2017   US guided biopsy INVASIVE MAMMARY CARCINOMA  . CATARACT EXTRACTION W/PHACO Left 04/28/2018   Procedure: CATARACT EXTRACTION PHACO AND INTRAOCULAR LENS PLACEMENT (IOC);  Surgeon: Leandrew Koyanagi, MD;  Location: [facility] ORS;  Service: Ophthalmology;  Laterality: Left;  Lot # [id number]   1:03 AP%   13.4 CDE     8.40  . CATARACT EXTRACTION W/PHACO Right 06/09/2018   Procedure: CATARACT EXTRACTION PHACO AND INTRAOCULAR LENS PLACEMENT (IOC);  Surgeon: Leandrew Koyanagi, MD;  Location: [facility] ORS;  Service: Ophthalmology;  Laterality: Right;  lot# [id number] [place] 0:55 ap 16.3% cde 8.21  . HIP ARTHROPLASTY    . JOINT REPLACEMENT Right 2003   total hip replacement  . LITHOTRIPSY  1997   kidney stone  . MASTECTOMY Left 01/29/2017  . MASTECTOMY W/ SENTINEL NODE BIOPSY Left 05/18/2017   Procedure: MASTECTOMY WITH SENTINEL LYMPH NODE BIOPSY;  Surgeon: Robert Bellow, MD;  Location: [facility] ORS;  Service: General;  Laterality: Left;  . PORT-A-CATH REMOVAL  09/2017  . PORTACATH PLACEMENT Right 02/15/2017   Procedure: INSERTION PORT-A-CATH;  Surgeon: Robert Bellow, MD;  Location: [facility] ORS;  Service: General;  Laterality: Right;  . TONSILLECTOMY  1970   Family History  Problem Relation Age of Onset  . Alcohol abuse Father   . Diabetes Father   . Stroke Father   . Hypertension Mother   . Endometrial cancer Mother   . Osteoarthritis Mother   . Breast cancer Paternal Aunt 15  . Liver disease Sister   . Breast cancer Maternal Aunt   . Rheum arthritis Maternal Grandmother   . Diabetes Paternal Grandmother   . Parkinson's disease Paternal Grandfather    Social History   Socioeconomic History  . Marital status: Divorced    Spouse name: Not on file  . Number of children: Not on file  . Years of education: Not on file  . Highest education level: Not on file  Occupational History  . Not on file  Social Needs  . Financial resource strain: Not on file  . Food insecurity:    Worry: Not on file    Inability: Not on file  . Transportation needs:    Medical: Not on file    Non-medical: Not on file  Tobacco Use  . Smoking status: Former Smoker    Packs/day: 1.50    Years: 25.00    Pack years: 37.50    Types: Cigarettes    Last attempt to quit: 10/20/1983    Years since quitting: 35.0  . Smokeless  tobacco: Never Used  Substance and Sexual Activity  . Alcohol use: No  . Drug use: No  . Sexual activity: Not on file  Lifestyle  . Physical activity:    Days per week: Not on file    Minutes per session: Not on file  . Stress: Not on file  Relationships  . Social connections:    Talks on phone: Not on file    Gets together: Not on file    Attends religious service: Not on file    Active member of club or organization:  Not on file    Attends meetings of clubs or organizations: Not on file    Relationship status: Not on file  Other Topics Concern  . Not on file  Social History Narrative  . Not on file    Tobacco Counseling Counseling given: No   Clinical Intake:  Pre-visit preparation completed: Yes  Pain Score: 1      Nutritional Status: BMI > 30  Obese Nutritional Risks: None Diabetes: No  How often do you need to have someone help you when you read instructions, pamphlets, or other written materials from your doctor or pharmacy?: 1 - Never What is the last grade level you completed in school?: 12th grade + 1 yr college  Interpreter Needed?: No  Information entered by :: LPinson, LPN   Activities of Daily Living In your present state of health, do you have any difficulty performing the following activities: 11/14/2018 06/09/2018  Hearing? Tempie Donning  Vision? N N  Difficulty concentrating or making decisions? N N  Walking or climbing stairs? N N  Dressing or bathing? N N  Doing errands, shopping? N -  Preparing Food and eating ? N -  Using the Toilet? N -  In the past six months, have you accidently leaked urine? N -  Do you have problems with loss of bowel control? N -  Managing your Medications? N -  Managing your Finances? N -  Housekeeping or managing your Housekeeping? N -  Some recent data might be hidden     Immunizations and Health Maintenance Immunization History  Administered Date(s) Administered  . Influenza, High Dose Seasonal PF 07/28/2017,  07/17/2018  . Influenza,inj,Quad PF,6+ Mos 07/05/2013, 07/13/2014, 08/20/2015  . Influenza-Unspecified 07/19/2016  . Pneumococcal Conjugate-13 08/20/2015  . Pneumococcal Polysaccharide-23 11/03/2006, 07/05/2013  . Td 05/03/2002   There are no preventive care reminders to display for this patient.  Patient Care Team: Tower, Wynelle Fanny, MD as PCP - General Tower, Wynelle Fanny, MD as Consulting Physician (Family Medicine) Bary Castilla, Forest Gleason, MD (General Surgery)  Indicate any recent Medical Services you may have received from other than [facility] providers in the past year (date may be approximate).     Assessment:   This is a routine wellness examination for Chrisette.  Hearing/Vision screen Hearing Screening Comments: Bilateral hearing aids Vision Screening Comments: Vision exam in Nov 2019 with Dr. Wallace Going  Dietary issues and exercise activities discussed: Current Exercise Habits: The patient does not participate in regular exercise at present, Exercise limited by: orthopedic condition(s)  Goals    . Patient Stated     Starting 11/14/18, I will continue to take medications as prescribed.       Depression Screen PHQ 2/9 Scores 11/14/2018  PHQ - 2 Score 0  PHQ- 9 Score 0    Fall Risk Fall Risk  11/14/2018  Falls in the past year? 0   Cognitive Function: MMSE - Mini Mental State Exam 11/14/2018  Orientation to time 5  Orientation to Place 5  Registration 3  Attention/ Calculation 0  Recall 3  Language- name 2 objects 0  Language- repeat 1  Language- follow 3 step command 3  Language- read & follow direction 0  Write a sentence 0  Copy design 0  Total score 20     PLEASE NOTE: A Mini-Cog screen was completed. Maximum score is 20. A value of 0 denotes this part of [place] MMSE was not completed or the patient failed this part of the Mini-Cog screening.  Mini-Cog Screening Orientation to Time - Max 5 pts Orientation to Place - Max 5 pts Registration - Max 3 pts Recall - Max 3  pts Language Repeat - Max 1 pts Language Follow 3 Step Command - Max 3 pts     Screening Tests Health Maintenance  Topic Date Due  . TETANUS/TDAP  10/18/2020 (Originally 05/03/2012)  . Hepatitis C Screening  11/20/2020 (Originally 31-Oct-1945)  . COLONOSCOPY  11/21/2023 (Originally 11/07/1997)  . MAMMOGRAM  01/14/2019  . INFLUENZA VACCINE  Completed  . DEXA SCAN  Completed  . PNA vac Low Risk Adult  Completed       Plan:     I have personally reviewed, addressed, and noted the following in the patient's chart:  A. Medical and social history B. Use of alcohol, tobacco or illicit drugs  C. Current medications and supplements D. Functional ability and status E.  Nutritional status F.  Physical activity G. Advance directives H. List of other physicians I.  Hospitalizations, surgeries, and ER visits in previous 12 months J.  [place] to include hearing, vision, cognitive, depression L. Referrals and appointments - none  In addition, I have reviewed and discussed with patient certain preventive protocols, quality metrics, and best practice recommendations. A written personalized care plan for preventive services as well as general preventive health recommendations were provided to patient.  See attached scanned questionnaire for additional information.   Signed,   Lindell Noe, MHA, BS, LPN Health Coach

## 2018-11-22 NOTE — Progress Notes (Signed)
Office Visit Note  Patient: Samantha Valentine             Date of Birth: 1945/12/01           MRN: [id number]             PCP: Abner Greenspan, MD Referring: Tower, Wynelle Fanny, MD Visit Date: 12/06/2018 Occupation: @GUAROCC @  Subjective:  Pain in multiple joints    History of Present Illness: Samantha Valentine is a 73 y.o. female with history of seropositive rheumatoid arthritis, DDD, osteoarthritis, fibromyalgia, and osteoporosis.  She is currently on Enbrel 50 mg sq injections once weekly.  She denies any RA flares.  She has had 2 prolia injections, and she reports after her second injection she had right thigh pain and jaw pain.  She reports these symptoms have resolved.  She states she is due for her next prolia injection and she is unsure if she should proceed.  She reports she is unable to taking Fosamax or reclast due to developing a rash.  She is due for her DEXA in July 2020. She had to stop cymbalta due to developing headaches and elevated BP.  She states she felt cymbalta as helping with her pain.  She states she is currently having pain in both shoulders, both elbows, and both hands.  She has been having increased fatigue.   Activities of Daily Living:  Patient reports morning stiffness all day.   Patient Reports nocturnal pain.  Difficulty dressing/grooming: Denies Difficulty climbing stairs: Reports Difficulty getting out of chair: Reports Difficulty using hands for taps, buttons, cutlery, and/or writing: Reports  Review of Systems  Constitutional: Positive for fatigue.  HENT: Positive for mouth dryness. Negative for mouth sores and nose dryness.   Eyes: Positive for dryness. Negative for pain and visual disturbance.  Respiratory: Negative for cough, hemoptysis, shortness of breath and difficulty breathing.   Cardiovascular: Positive for palpitations. Negative for chest pain, hypertension and swelling in legs/feet.  Gastrointestinal: Negative for blood in stool,  constipation and diarrhea.  Endocrine: Negative for increased urination.  Genitourinary: Negative for painful urination.  Musculoskeletal: Positive for arthralgias, joint pain, myalgias, morning stiffness, muscle tenderness and myalgias. Negative for joint swelling and muscle weakness.  Skin: Negative for color change, pallor, rash, hair loss, nodules/bumps, skin tightness, ulcers and sensitivity to sunlight.  Allergic/Immunologic: Negative for susceptible to infections.  Neurological: Negative for dizziness, numbness, headaches and weakness.  Hematological: Negative for swollen glands.  Psychiatric/Behavioral: Positive for sleep disturbance. Negative for depressed mood. The patient is not nervous/anxious.     PMFS History:  Patient Active Problem List   Diagnosis Date Noted  . Elevated glucose level 11/23/2018  . Routine general medical examination at a health care facility 08/09/2018  . DDD (degenerative disc disease), thoracic 06/28/2018  . DDD (degenerative disc disease), cervical 06/28/2018  . Atherosclerosis of aorta ([place]) 03/11/2018  . Sleep disturbance 06/18/2017  . Tachycardia 05/25/2017  . Dysuria 05/25/2017  . Estrogen deficiency 03/22/2017  . Malignant neoplasm of upper-outer quadrant of left breast in female, estrogen receptor negative ([place]) 02/04/2017  . Tendinopathy of right shoulder 01/25/2017  . High risk medication use 09/29/2016  . DJD (degenerative joint disease), cervical 09/29/2016  . History of right hip replacement 09/29/2016  . Eczema 07/13/2014  . Adverse effect of immunosuppressive drug 04/27/2012  . ANXIETY DEPRESSION 10/28/2008  . Spinal stenosis 12/29/2007  . Hypothyroidism 12/28/2007  . Vitamin D deficiency 12/28/2007  . HEARING LOSS 12/28/2007  . Seropositive  rheumatoid arthritis of multiple sites ([place]) 12/28/2007  . DDD (degenerative disc disease), lumbar 12/28/2007  . Fibromyalgia 12/28/2007  . Osteoporosis 12/28/2007  . MIGRAINES, HX OF  12/28/2007    Past Medical History:  Diagnosis Date  . Anxiety   . Arthritis    RA  . Breast cancer ([place]) 01/29/2017  . Breast mass 1 year   . Cancer ([place]) 01/29/2017   left breast INVASIVE MAMMARY CARCINOMA, ER/PR positive  . Cataract 2019   resolved with surgery  . Collagen vascular disease ([facility])    Rhematoid Arthritis  . Complication of anesthesia   . DDD (degenerative disc disease)    in neck  . Depression   . Dyspnea   . Edema    FEET/LEGS  . Emphysema of lung ([place])   . Fibromyalgia   . GERD (gastroesophageal reflux disease)    NO MEDS  . History of hiatal hernia   . History of kidney stones    MULTIPLE KIDNEY STONES BIL  . HOH (hard of hearing)    AIDS  . Hypothyroidism   . Macular degeneration, bilateral   . Migraine   . Osteoporosis   . Palpitations   . Personal history of chemotherapy    prior to mastectomy  . PONV (postoperative nausea and vomiting)    AFTER FIRST CATARACT  . Rheumatoid arthritis ([place])     Family History  Problem Relation Age of Onset  . Alcohol abuse Father   . Diabetes Father   . Stroke Father   . Hypertension Mother   . Endometrial cancer Mother   . Osteoarthritis Mother   . Breast cancer Paternal Aunt 31  . Liver disease Sister   . Breast cancer Maternal Aunt   . Rheum arthritis Maternal Grandmother   . Diabetes Paternal Grandmother   . Parkinson's disease Paternal Grandfather    Past Surgical History:  Procedure Laterality Date  . BREAST BIOPSY Left 01/29/2017   US guided biopsy INVASIVE MAMMARY CARCINOMA  . CATARACT EXTRACTION W/PHACO Left 04/28/2018   Procedure: CATARACT EXTRACTION PHACO AND INTRAOCULAR LENS PLACEMENT (IOC);  Surgeon: Leandrew Koyanagi, MD;  Location: [facility] ORS;  Service: Ophthalmology;  Laterality: Left;  Lot # [id number]   1:03 AP%   13.4 CDE    8.40  . CATARACT EXTRACTION W/PHACO Right 06/09/2018   Procedure: CATARACT EXTRACTION PHACO AND INTRAOCULAR LENS PLACEMENT (IOC);  Surgeon: Leandrew Koyanagi, MD;  Location: [facility] OR[facility];  Service: Ophthalmology;  Laterality: Right;  lot# [id number] [place] 0:55 ap 16.3% cde 8.21  . HIP ARTHROPLASTY    . JOINT REPLACEMENT Right 2003   total hip replacement  . LITHOTRIPSY  1997   kidney stone  . MASTECTOMY Left 01/29/2017  . MASTECTOMY W/ SENTINEL NODE BIOPSY Left 05/18/2017   Procedure: MASTECTOMY WITH SENTINEL LYMPH NODE BIOPSY;  Surgeon: Robert Bellow, MD;  Location: [facility] ORS;  Service: General;  Laterality: Left;  . PORT-A-CATH REMOVAL  09/2017  . PORTACATH PLACEMENT Right 02/15/2017   Procedure: INSERTION PORT-A-CATH;  Surgeon: Robert Bellow, MD;  Location: [facility] ORS;  Service: General;  Laterality: Right;  . TONSILLECTOMY  1970   Social History   Social History Narrative  . Not on file   Immunization History  Administered Date(s) Administered  . Influenza, High Dose Seasonal PF 07/28/2017, 07/17/2018  . Influenza,inj,Quad PF,6+ Mos 07/05/2013, 07/13/2014, 08/20/2015  . Influenza-Unspecified 07/19/2016, 07/28/2017, 07/17/2018  . Pneumococcal Conjugate-13 08/20/2015  . Pneumococcal Polysaccharide-23 11/03/2006, 07/05/2013  . Td 05/03/2002  Objective: Vital Signs: BP (!) 144/82 (BP Location: Right Arm, Patient Position: Sitting, Cuff Size: Normal)   Pulse 77   Resp 13   Ht 5\' 4"  (1.626 m)   Wt 181 lb (82.1 kg)   BMI 31.07 kg/m    Physical Exam Vitals signs and nursing note reviewed.  Constitutional:      Appearance: She is well-developed.  HENT:     Head: Normocephalic and atraumatic.  Eyes:     Conjunctiva/sclera: Conjunctivae normal.  Neck:     Musculoskeletal: Normal range of motion.  Cardiovascular:     Rate and Rhythm: Normal rate and regular rhythm.     Heart sounds: Normal heart sounds.  Pulmonary:     Effort: Pulmonary effort is normal.     Breath sounds: Normal breath sounds.  Abdominal:     General: Bowel sounds are normal.     Palpations: Abdomen is soft.  Lymphadenopathy:     Cervical: No  cervical adenopathy.  Skin:    General: Skin is warm and dry.     Capillary Refill: Capillary refill takes less than 2 seconds.  Neurological:     Mental Status: She is alert and oriented to person, place, and time.  Psychiatric:        Behavior: Behavior normal.      Musculoskeletal Exam: C-spine, thoracic spine, and lumbar spine good ROM.  Shoulder joints, elbow joints, wrist joints, MCPs, PIPs, and DIPs good ROM with no synovitis.  PIP and DIP synovial thickening.  Bilateral CMC joint synovial thickening. Hip joints, knee joints ankle joints, MTPs, PIPs, and DIPs good ROM with no synovitis.  No warmth or effusion of knee joints.  No tenderness or swelling of ankle joints.  No tenderness over trochanteric bursa bilaterally.    CDAI Exam: CDAI Score: 0.4  Patient Global Assessment: 2 (mm); Provider Global Assessment: 2 (mm) Swollen: 0 ; Tender: 0  Joint Exam   Not documented   There is currently no information documented on the homunculus. Go to the Rheumatology activity and complete the homunculus joint exam.  Investigation: No additional findings.  Imaging: No results found.  Recent Labs: Lab Results  Component Value Date   WBC 5.1 11/14/2018   HGB 14.9 11/14/2018   PLT 206.0 11/14/2018   NA 137 11/14/2018   K 4.6 11/14/2018   CL 102 11/14/2018   CO2 26 11/14/2018   GLUCOSE 102 (H) 11/14/2018   BUN 14 11/14/2018   CREATININE 0.72 11/14/2018   BILITOT 0.5 11/14/2018   ALKPHOS 63 11/14/2018   AST 15 11/14/2018   ALT 11 11/14/2018   PROT 6.9 11/14/2018   ALBUMIN 3.9 11/14/2018   CALCIUM 9.1 11/14/2018   GFRAA 92 08/19/2018   QFTBGOLD NEGATIVE 09/03/2017   QFTBGOLDPLUS NEGATIVE 08/19/2018    Speciality Comments: No specialty comments available.  Procedures:  No procedures performed Allergies: Adhesive [tape]; Cymbalta [duloxetine hcl]; Hydroxychloroquine sulfate; Ibandronate sodium; and Risedronate sodium     Assessment / Plan:     Visit Diagnoses:  Seropositive rheumatoid arthritis of multiple sites ([place]) - Positive RF with erosive disease: She has no synovitis on exam.  She is not any recent rheumatoid arthritis flares.  She is clinically doing well on Enbrel 50 mg subcutaneous injections once weekly.  She has generalized muscle aches and muscle tenderness due to fibromyalgia.  She takes Celebrex 200 mg 1 tablet twice daily PRN for pain relief.  A refill sent to the pharmacy.  She has no joint inflammation at  this time.  She will continue on Enbrel 50 mg abuse injections once weekly.  A refill will be sent to the pharmacy.  She was advised to notify us if she develops increased joint pain or joint swelling.  She will follow-up in the office in 5 months.  High risk medication use - Enbrel 50 mg sq q wk, Discontinued SSZ due constipation.  CBC and CMP were drawn on 11/14/2018.  She will return for lab work in April and every 3 months to monitor for drug toxicity.  TB gold negative on 08/19/2018.  Acute pain of left shoulder: She has good range of motion with no discomfort at this time.  She has occasional left shoulder joint pain at night.  Primary osteoarthritis of both hands: She has PIP and DIP synovial thickening consistent with osteoarthritis of bilateral hands.  She has bilateral CMC joint synovial thickening.  No synovitis was noted.  She has complete fist formation bilaterally.  Joint protection and muscle strengthening were discussed.  History of right hip replacement: Doing well.  She has good range of motion with no discomfort.  DDD (degenerative disc disease), cervical: Chronic pain.  She is no symptoms of radiculopathy at this time.  DDD (degenerative disc disease), thoracic: Chronic pain.  DDD (degenerative disc disease), lumbar: Chronic pain.  She has midline spinal tenderness in lumbar region.  Fibromyalgia: She continues to have generalized muscle aches muscle tenderness due to fibromyalgia.  She has chronic fatigue and insomnia.   She takes Celebrex 200 mg 1 capsule by mouth twice daily PRN for pain relief.  She discontinued Cymbalta after developing headaches and elevated blood pressure.  She felt as though Cymbalta was effective and has been had some increased muscle aches since discontinuing.  We discussed the importance of regular exercise and good sleep hygiene.  Age-related osteoporosis without current pathological fracture - DEXA scan on 05/06/17 showed T score of -2.4.  She has been treated with Forteo in the past.  She is currently on Prolia which was a started by Dr. Glori Bickers.  She will be contacting Dr. Glori Bickers to schedule next prolia injection.  She is unable to take bisphosphonates in the future due to developing a rash.  She will continue to receive Prolia injections every 6 months.  She will continue taking a vitamin D supplement on a daily basis.  She will be due for repeat DEXA in July 2020.   Malignant neoplasm of upper-outer quadrant of left breast in female, estrogen receptor negative ([place]) -  Patient recently stopped anastrozole due to increase myalgias and arthralgias. She also stopped her chemotherapy.  Other medical conditions are listed as follows:  History of anxiety  History of hearing loss  History of migraine  History of depression  History of vitamin D deficiency  History of hypothyroidism   Orders: No orders of the defined types were placed in this encounter.  Meds ordered this encounter  Medications  . celecoxib (CELEBREX) 200 MG capsule    Sig: 1 capsule by mouth twice a day when necessary with meals    Dispense:  135 capsule    Refill:  0     Follow-Up Instructions: Return in about 5 months (around 05/06/2019) for Rheumatoid arthritis, Osteoarthritis, Fibromyalgia, Osteoporosis, DDD.   Ofilia Neas, PA-C   I examined and evaluated the patient with Hazel Sams PA.  Patient continues to have discomfort in her joints.  She had no synovitis on examination today.  The clinical  findings are consistent  with osteoarthritis.  Her rheumatoid arthritis seems to be very well controlled on Enbrel.  She is concerned about the use of Prolia as well.  She would not be able to switch to bisphosphonates secondary to side effects.  I have advised her to continue with Prolia. The plan of care was discussed as noted above.  Bo Merino, MD  Note - This record has been created using Editor, commissioning.  Chart creation errors have been sought, but may not always  have been located. Such creation errors do not reflect on  the standard of medical care.

## 2018-11-23 ENCOUNTER — Encounter: Payer: Self-pay | Admitting: Family Medicine

## 2018-11-23 ENCOUNTER — Ambulatory Visit (INDEPENDENT_AMBULATORY_CARE_PROVIDER_SITE_OTHER): Payer: PPO | Admitting: Family Medicine

## 2018-11-23 VITALS — BP 125/88 | HR 81 | Temp 98.0°F | Ht 65.0 in | Wt 181.8 lb

## 2018-11-23 DIAGNOSIS — E039 Hypothyroidism, unspecified: Secondary | ICD-10-CM

## 2018-11-23 DIAGNOSIS — M810 Age-related osteoporosis without current pathological fracture: Secondary | ICD-10-CM

## 2018-11-23 DIAGNOSIS — R7309 Other abnormal glucose: Secondary | ICD-10-CM

## 2018-11-23 DIAGNOSIS — E559 Vitamin D deficiency, unspecified: Secondary | ICD-10-CM

## 2018-11-23 DIAGNOSIS — M0579 Rheumatoid arthritis with rheumatoid factor of multiple sites without organ or systems involvement: Secondary | ICD-10-CM | POA: Diagnosis not present

## 2018-11-23 DIAGNOSIS — Z171 Estrogen receptor negative status [ER-]: Secondary | ICD-10-CM | POA: Diagnosis not present

## 2018-11-23 DIAGNOSIS — Z0000 Encounter for general adult medical examination without abnormal findings: Secondary | ICD-10-CM | POA: Diagnosis not present

## 2018-11-23 DIAGNOSIS — C50412 Malignant neoplasm of upper-outer quadrant of left female breast: Secondary | ICD-10-CM

## 2018-11-23 DIAGNOSIS — I700 Atherosclerosis of aorta: Secondary | ICD-10-CM

## 2018-11-23 MED ORDER — LORAZEPAM 1 MG PO TABS: 1.0000 mg | ORAL_TABLET | Freq: Every evening | ORAL | 1 refills | Status: DC | PRN

## 2018-11-23 MED ORDER — LEVOTHYROXINE SODIUM 137 MCG PO TABS: 137.0000 ug | ORAL_TABLET | Freq: Every day | ORAL | 11 refills | Status: DC

## 2018-11-23 NOTE — Patient Instructions (Addendum)
See rheumatology as planned and discuss the ? Reaction to Prolia vs RA  If you are interested in the new shingles vaccine (Shingrix) - call your local pharmacy to check on coverage and availability  If affordable, get on a wait list at your pharmacy to get the vaccine. Talk to your rheumatologist about it   Go up to 2000 iu vit D daily   Go up to 137 mcg for thyroid dose  If any trouble let me know  We will need labs in 6 weeks   For diabetes prevention Try to get most of your carbohydrates from produce (with the exception of white potatoes)  Eat less bread/pasta/rice/snack foods/cereals/sweets and other items from the middle of the grocery store (processed carbs)

## 2018-11-23 NOTE — Progress Notes (Signed)
Subjective:    Patient ID: Samantha Valentine, female    DOB: 1945-11-19, 73 y.o.   MRN: [id number]  HPI Here for health maintenance exam and to review chronic medical problems   Is doing fairly well    Wt Readings from Last 3 Encounters:  11/23/18 181 lb 12 oz (82.4 kg)  11/14/18 181 lb 12 oz (82.4 kg)  06/28/18 172 lb 9.6 oz (78.3 kg)  taking fair care of herself  Had originally lost wt on cymbalta Eats all the time  30.24 kg/m    Was on cymbalta for pain (more than mood)  It was giving her headaches and fatigue  Ran bp up also  She stopped it after 6 mo    Had amw on 1/27 No concerns  Mammogram 3/19 -negative  Self breast exam -no lumps or changes  Personal hx of breast cancer -overall doing well  Continues to see oncology  dexa 7/18 - OP in FN D level of 33  prolia- thinks it caused muscle and joint pain in hips (right after 2nd shot) , and also jaw pain (came and went)  Hard to tell if prolia or RA Her shot is due at the end of the month - will disc with her rheumatologist before getting it  Past 2 y of forteo Allergic to bisphosphenates Past hx of fractures   No falls or fx this year   Colon cancer screening -not interested in any   Zoster status - may be interested in shingrix/will disc with rheum  Blood pressure- better on re check 125/88 BP Readings from Last 3 Encounters:  11/23/18 (!) 150/90  11/14/18 110/74  06/28/18 139/84  a challenge to get exercise with joint pain     Pulse Readings from Last 3 Encounters:  11/23/18 81  11/14/18 87  06/28/18 84    Hypothyroidism  Pt has no clinical changes No change in energy level/ hair or skin/ edema and no tremor Lab Results  Component Value Date   TSH 7.15 (H) 11/14/2018    No recent missed doses   RA- struggles  Keeps her from sleeping   Cholesterol  Lab Results  Component Value Date   CHOL 196 11/14/2018   CHOL 170 05/25/2017   Lab Results  Component Value Date   HDL 68.80  11/14/2018   HDL 56.90 05/25/2017   Lab Results  Component Value Date   LDLCALC 112 (H) 11/14/2018   LDLCALC 96 05/25/2017   Lab Results  Component Value Date   TRIG 76.0 11/14/2018   TRIG 86.0 05/25/2017   Lab Results  Component Value Date   CHOLHDL 3 11/14/2018   CHOLHDL 3 05/25/2017   No results found for: LDLDIRECT Overall excellent HDL   Glucose 102   Patient Active Problem List   Diagnosis Date Noted  . Elevated glucose level 11/23/2018  . Routine general medical examination at a health care facility 08/09/2018  . DDD (degenerative disc disease), thoracic 06/28/2018  . DDD (degenerative disc disease), cervical 06/28/2018  . Atherosclerosis of aorta ([place]) 03/11/2018  . Sleep disturbance 06/18/2017  . Tachycardia 05/25/2017  . Dysuria 05/25/2017  . Estrogen deficiency 03/22/2017  . Malignant neoplasm of upper-outer quadrant of left breast in female, estrogen receptor negative ([place]) 02/04/2017  . Tendinopathy of right shoulder 01/25/2017  . High risk medication use 09/29/2016  . DJD (degenerative joint disease), cervical 09/29/2016  . History of right hip replacement 09/29/2016  . Eczema 07/13/2014  . Adverse effect of  immunosuppressive drug 04/27/2012  . ANXIETY DEPRESSION 10/28/2008  . Spinal stenosis 12/29/2007  . Hypothyroidism 12/28/2007  . Vitamin D deficiency 12/28/2007  . HEARING LOSS 12/28/2007  . Seropositive rheumatoid arthritis of multiple sites ([place]) 12/28/2007  . DDD (degenerative disc disease), lumbar 12/28/2007  . Fibromyalgia 12/28/2007  . Osteoporosis 12/28/2007  . MIGRAINES, HX OF 12/28/2007   Past Medical History:  Diagnosis Date  . Anxiety   . Arthritis    RA  . Breast cancer ([place]) 01/29/2017  . Breast mass 1 year   . Cancer ([place]) 01/29/2017   left breast INVASIVE MAMMARY CARCINOMA, ER/PR positive  . Cataract 2019   resolved with surgery  . Collagen vascular disease (HCC)    Rhematoid Arthritis  . Complication of anesthesia   .  DDD (degenerative disc disease)    in neck  . Depression   . Dyspnea   . Edema    FEET/LEGS  . Emphysema of lung ([place])   . Fibromyalgia   . GERD (gastroesophageal reflux disease)    NO MEDS  . History of hiatal hernia   . History of kidney stones    MULTIPLE KIDNEY STONES BIL  . HOH (hard of hearing)    AIDS  . Hypothyroidism   . Macular degeneration, bilateral   . Migraine   . Osteoporosis   . Palpitations   . Personal history of chemotherapy    prior to mastectomy  . PONV (postoperative nausea and vomiting)    AFTER FIRST CATARACT  . Rheumatoid arthritis [facility])    Past Surgical History:  Procedure Laterality Date  . BREAST BIOPSY Left 01/29/2017   US guided biopsy INVASIVE MAMMARY CARCINOMA  . CATARACT EXTRACTION W/PHACO Left 04/28/2018   Procedure: CATARACT EXTRACTION PHACO AND INTRAOCULAR LENS PLACEMENT (IOC);  Surgeon: Leandrew Koyanagi, MD;  Location: [facility] ORS;  Service: Ophthalmology;  Laterality: Left;  Lot # [id number]   1:03 AP%   13.4 CDE    8.40  . CATARACT EXTRACTION W/PHACO Right 06/09/2018   Procedure: CATARACT EXTRACTION PHACO AND INTRAOCULAR LENS PLACEMENT (IOC);  Surgeon: Leandrew Koyanagi, MD;  Location: [facility] ORS;  Service: Ophthalmology;  Laterality: Right;  lot# [id number] [place] 0:55 ap 16.3% cde 8.21  . HIP ARTHROPLASTY    . JOINT REPLACEMENT Right 2003   total hip replacement  . LITHOTRIPSY  1997   kidney stone  . MASTECTOMY Left 01/29/2017  . MASTECTOMY W/ SENTINEL NODE BIOPSY Left 05/18/2017   Procedure: MASTECTOMY WITH SENTINEL LYMPH NODE BIOPSY;  Surgeon: Robert Bellow, MD;  Location: [facility] ORS;  Service: General;  Laterality: Left;  . PORT-A-CATH REMOVAL  09/2017  . PORTACATH PLACEMENT Right 02/15/2017   Procedure: INSERTION PORT-A-CATH;  Surgeon: Robert Bellow, MD;  Location: [facility] ORS;  Service: General;  Laterality: Right;  . TONSILLECTOMY  1970   Social History   Tobacco Use  . Smoking status: Former Smoker    Packs/day:  1.50    Years: 25.00    Pack years: 37.50    Types: Cigarettes    Last attempt to quit: 10/20/1983    Years since quitting: 35.1  . Smokeless tobacco: Never Used  Substance Use Topics  . Alcohol use: No  . Drug use: No   Family History  Problem Relation Age of Onset  . Alcohol abuse Father   . Diabetes Father   . Stroke Father   . Hypertension Mother   . Endometrial cancer Mother   . Osteoarthritis Mother   . Breast cancer Paternal  Aunt 80  . Liver disease Sister   . Breast cancer Maternal Aunt   . Rheum arthritis Maternal Grandmother   . Diabetes Paternal Grandmother   . Parkinson's disease Paternal Grandfather    Allergies  Allergen Reactions  . Adhesive [Tape] Other (See Comments)    Skin blistered (PAPER TAPE OK)  . Cymbalta [Duloxetine Hcl]     Headache Inc bp  . Hydroxychloroquine Sulfate Rash  . Ibandronate Sodium Rash  . Risedronate Sodium Rash   Current Outpatient Medications on File Prior to Visit  Medication Sig Dispense Refill  . aspirin EC 81 MG tablet Take 81 mg by mouth daily.    . celecoxib (CELEBREX) 200 MG capsule 1 capsule by mouth twice a day when necessary with meals (Patient taking differently: Take 200 mg by mouth 2 (two) times daily as needed for moderate pain. 1 capsule by mouth twice a day when necessary with meal) 135 capsule 0  . cholecalciferol (VITAMIN D) 1000 units tablet Take 1,000 Units by mouth daily.    Marland Kitchen etanercept (ENBREL) 50 MG/ML injection Inject 0.98 mLs (50 mg total) into the skin once a week. SUNDAYS 11.76 mL 0   No current facility-administered medications on file prior to visit.       Review of Systems  Constitutional: Positive for fatigue. Negative for activity change, appetite change, fever and unexpected weight change.  HENT: Negative for congestion, ear pain, rhinorrhea, sinus pressure and sore throat.   Eyes: Negative for pain, redness and visual disturbance.  Respiratory: Negative for cough, shortness of breath and  wheezing.   Cardiovascular: Negative for chest pain and palpitations.  Gastrointestinal: Negative for abdominal pain, blood in stool, constipation and diarrhea.  Endocrine: Negative for polydipsia and polyuria.  Genitourinary: Negative for dysuria, frequency and urgency.  Musculoskeletal: Positive for arthralgias and back pain. Negative for myalgias.  Skin: Negative for pallor and rash.       R great toe nail is ingrown laterally  Allergic/Immunologic: Negative for environmental allergies.  Neurological: Negative for dizziness, syncope and headaches.  Hematological: Negative for adenopathy. Does not bruise/bleed easily.  Psychiatric/Behavioral: Negative for decreased concentration and dysphoric mood. The patient is not nervous/anxious.        Objective:   Physical Exam Constitutional:      General: She is not in acute distress.    Appearance: Normal appearance. She is well-developed. She is obese. She is not ill-appearing.  HENT:     Head: Normocephalic and atraumatic.     Right Ear: Tympanic membrane, ear canal and external ear normal.     Left Ear: Tympanic membrane, ear canal and external ear normal.     Nose: Nose normal.     Mouth/Throat:     Mouth: Mucous membranes are moist.     Pharynx: Oropharynx is clear.  Eyes:     General: No scleral icterus.    Conjunctiva/sclera: Conjunctivae normal.     Pupils: Pupils are equal, round, and reactive to light.  Neck:     Musculoskeletal: Normal range of motion and neck supple.     Thyroid: No thyromegaly.     Vascular: No carotid bruit or JVD.  Cardiovascular:     Rate and Rhythm: Normal rate and regular rhythm.     Pulses: Normal pulses.     Heart sounds: Normal heart sounds. No gallop.   Pulmonary:     Effort: Pulmonary effort is normal. No respiratory distress.     Breath sounds: Normal breath sounds. No  wheezing or rales.  Chest:     Chest wall: No tenderness.  Abdominal:     General: Bowel sounds are normal. There is no  distension or abdominal bruit.     Palpations: Abdomen is soft. There is no mass.     Tenderness: There is no abdominal tenderness.  Genitourinary:    Comments: S/p L mastectomy - site clear by palp  R breast : Breast exam: No mass, nodules, thickening, tenderness, bulging, retraction, inflamation, nipple discharge or skin changes noted.  No axillary or clavicular LA.     Musculoskeletal: Normal range of motion.        General: No tenderness.  Lymphadenopathy:     Cervical: No cervical adenopathy.  Skin:    General: Skin is warm and dry.     Capillary Refill: Capillary refill takes less than 2 seconds.     Coloration: Skin is not pale.     Findings: No erythema or rash.  Neurological:     General: No focal deficit present.     Mental Status: She is alert. Mental status is at baseline.     Cranial Nerves: No cranial nerve deficit.     Motor: No abnormal muscle tone.     Coordination: Coordination normal.     Deep Tendon Reflexes: Reflexes are normal and symmetric. Reflexes normal.  Psychiatric:        Mood and Affect: Mood normal.           Assessment & Plan:   Problem List Items Addressed This Visit      Cardiovascular and Mediastinum   Atherosclerosis of aorta (HCC)    Cholesterol controlled  No clinical changes         Endocrine   Hypothyroidism - Primary    Lab Results  Component Value Date   TSH 7.15 (H) 11/14/2018    levothy dose inc to 137 mcg Will plan labs in 4-6 weeks       Relevant Medications   levothyroxine (SYNTHROID, LEVOTHROID) 137 MCG tablet     Musculoskeletal and Integument   Seropositive rheumatoid arthritis of multiple sites ([place])    Struggling -under rheum care Had to stop cymbalta (helped with pain) due to headaches and ? Elevated bp       Osteoporosis    dexa 7/18  No falls or fx Allergic to bisphosphonates  Had 2 y of forteo Had first prolia dose and thinks it made her joint pain worse She will check in with rheumatology  regarding this before getting another shot         Other   Vitamin D deficiency    Vitamin D level is therapeutic with current supplementation Disc importance of this to bone and overall health Level of 33  She will inc daily dose to 2000 iu - to try and get level over 40       Malignant neoplasm of upper-outer quadrant of left breast in female, estrogen receptor negative (HCC)    No longer on femara  Doing well under onc care      Relevant Medications   LORazepam (ATIVAN) 1 MG tablet   Routine general medical examination at a health care facility    Reviewed health habits including diet and exercise and skin cancer prevention Reviewed appropriate screening tests for age  Also reviewed health mt list, fam hx and immunization status , as well as social and family history  Rev amw  See HPI Labs rev  Disc plan for OP  Also diet/exercise Disc shingrix -she will check in with her rheumatologist about it  Declines colon cancer screening         Elevated glucose level    102 fasting disc imp of low glycemic diet and wt loss to prevent DM2

## 2018-11-27 NOTE — Assessment & Plan Note (Signed)
Reviewed health habits including diet and exercise and skin cancer prevention Reviewed appropriate screening tests for age  Also reviewed health mt list, fam hx and immunization status , as well as social and family history  Rev amw  See HPI Labs rev  Disc plan for OP  Also diet/exercise Disc shingrix -she will check in with her rheumatologist about it  Declines colon cancer screening

## 2018-11-27 NOTE — Assessment & Plan Note (Signed)
No longer on femara  Doing well under onc care

## 2018-11-27 NOTE — Assessment & Plan Note (Signed)
Struggling -under rheum care Had to stop cymbalta (helped with pain) due to headaches and ? Elevated bp

## 2018-11-27 NOTE — Assessment & Plan Note (Signed)
102 fasting disc imp of low glycemic diet and wt loss to prevent DM2

## 2018-11-27 NOTE — Assessment & Plan Note (Signed)
Lab Results  Component Value Date   TSH 7.15 (H) 11/14/2018    levothy dose inc to 137 mcg Will plan labs in 4-6 weeks

## 2018-11-27 NOTE — Assessment & Plan Note (Signed)
Cholesterol controlled  No clinical changes

## 2018-11-27 NOTE — Assessment & Plan Note (Signed)
dexa 7/18  No falls or fx Allergic to bisphosphonates  Had 2 y of forteo Had first prolia dose and thinks it made her joint pain worse She will check in with rheumatology regarding this before getting another shot

## 2018-11-27 NOTE — Assessment & Plan Note (Signed)
Vitamin D level is therapeutic with current supplementation Disc importance of this to bone and overall health Level of 33  She will inc daily dose to 2000 iu - to try and get level over 40

## 2018-12-06 ENCOUNTER — Ambulatory Visit: Payer: PPO | Admitting: Rheumatology

## 2018-12-06 ENCOUNTER — Encounter: Payer: Self-pay | Admitting: Physician Assistant

## 2018-12-06 VITALS — BP 144/82 | HR 77 | Resp 13 | Ht 64.0 in | Wt 181.0 lb

## 2018-12-06 DIAGNOSIS — M19042 Primary osteoarthritis, left hand: Secondary | ICD-10-CM

## 2018-12-06 DIAGNOSIS — M5134 Other intervertebral disc degeneration, thoracic region: Secondary | ICD-10-CM | POA: Diagnosis not present

## 2018-12-06 DIAGNOSIS — M19041 Primary osteoarthritis, right hand: Secondary | ICD-10-CM | POA: Diagnosis not present

## 2018-12-06 DIAGNOSIS — Z96641 Presence of right artificial hip joint: Secondary | ICD-10-CM

## 2018-12-06 DIAGNOSIS — Z8659 Personal history of other mental and behavioral disorders: Secondary | ICD-10-CM

## 2018-12-06 DIAGNOSIS — C50412 Malignant neoplasm of upper-outer quadrant of left female breast: Secondary | ICD-10-CM

## 2018-12-06 DIAGNOSIS — M0579 Rheumatoid arthritis with rheumatoid factor of multiple sites without organ or systems involvement: Secondary | ICD-10-CM | POA: Diagnosis not present

## 2018-12-06 DIAGNOSIS — Z171 Estrogen receptor negative status [ER-]: Secondary | ICD-10-CM

## 2018-12-06 DIAGNOSIS — Z8639 Personal history of other endocrine, nutritional and metabolic disease: Secondary | ICD-10-CM

## 2018-12-06 DIAGNOSIS — M797 Fibromyalgia: Secondary | ICD-10-CM | POA: Diagnosis not present

## 2018-12-06 DIAGNOSIS — Z8669 Personal history of other diseases of the nervous system and sense organs: Secondary | ICD-10-CM

## 2018-12-06 DIAGNOSIS — M5136 Other intervertebral disc degeneration, lumbar region: Secondary | ICD-10-CM | POA: Diagnosis not present

## 2018-12-06 DIAGNOSIS — Z79899 Other long term (current) drug therapy: Secondary | ICD-10-CM

## 2018-12-06 DIAGNOSIS — M810 Age-related osteoporosis without current pathological fracture: Secondary | ICD-10-CM

## 2018-12-06 DIAGNOSIS — M25512 Pain in left shoulder: Secondary | ICD-10-CM | POA: Diagnosis not present

## 2018-12-06 DIAGNOSIS — M5030 Other cervical disc degeneration, unspecified cervical region: Secondary | ICD-10-CM

## 2018-12-06 MED ORDER — ETANERCEPT 50 MG/ML ~~LOC~~ SOAJ: 50.0000 mg | SUBCUTANEOUS | 0 refills | Status: DC

## 2018-12-06 MED ORDER — CELECOXIB 200 MG PO CAPS: ORAL_CAPSULE | ORAL | 0 refills | Status: DC

## 2018-12-06 NOTE — Patient Instructions (Signed)
Standing Labs We placed an order today for your standing lab work.    Please come back and get your standing labs in April and every 3 months  We have open lab Monday through Friday from 8:30-11:30 AM and 1:30-4:00 PM  at the office of Dr. Shaili Deveshwar.   You may experience shorter wait times on Monday and Friday afternoons. The office is located at [address], [address], [place],  [address] No appointment is necessary.   Labs are drawn by [facility].  You may receive a bill from [facility] for your lab work.  If you wish to have your labs drawn at another location, please call the office 24 hours in advance to send orders.  If you have any questions regarding directions or hours of operation,  please call [phone].   Just as a reminder please drink plenty of water prior to coming for your lab work. Thanks!  

## 2018-12-20 ENCOUNTER — Other Ambulatory Visit: Payer: Self-pay

## 2018-12-20 DIAGNOSIS — Z1231 Encounter for screening mammogram for malignant neoplasm of breast: Secondary | ICD-10-CM

## 2018-12-23 ENCOUNTER — Telehealth: Payer: Self-pay

## 2018-12-23 NOTE — Telephone Encounter (Signed)
Prolia Benefits submitted to insurance for review, awaiting cost approval.  Once benefits identified will contact patient to review and move forward with scheduling.

## 2019-01-02 ENCOUNTER — Telehealth: Payer: Self-pay | Admitting: Family Medicine

## 2019-01-02 DIAGNOSIS — E039 Hypothyroidism, unspecified: Secondary | ICD-10-CM

## 2019-01-02 NOTE — Telephone Encounter (Signed)
-----   Message from Ellamae Sia sent at 12/26/2018  3:58 PM EDT ----- Regarding: Lab orders for Tuesday, 3.17.20 Lab orders for 6 week labs

## 2019-01-04 ENCOUNTER — Other Ambulatory Visit: Payer: Self-pay

## 2019-01-04 ENCOUNTER — Other Ambulatory Visit (INDEPENDENT_AMBULATORY_CARE_PROVIDER_SITE_OTHER): Payer: PPO

## 2019-01-04 DIAGNOSIS — E039 Hypothyroidism, unspecified: Secondary | ICD-10-CM | POA: Diagnosis not present

## 2019-01-04 LAB — TSH: TSH: 1.24 u[IU]/mL (ref 0.35–4.50)

## 2019-01-04 NOTE — Addendum Note (Signed)
Addended by: Ellamae Sia on: 01/04/2019 10:06 AM   Modules accepted: Orders

## 2019-01-24 ENCOUNTER — Ambulatory Visit: Payer: PPO | Admitting: General Surgery

## 2019-02-07 ENCOUNTER — Telehealth: Payer: Self-pay

## 2019-02-07 ENCOUNTER — Encounter: Payer: Self-pay | Admitting: Rheumatology

## 2019-02-07 MED ORDER — ETANERCEPT 50 MG/ML ~~LOC~~ SOAJ: 50.0000 mg | SUBCUTANEOUS | 0 refills | Status: DC

## 2019-02-07 NOTE — Telephone Encounter (Signed)
Last Visit: 12/06/2018 Next Visit: 05/10/2019 Labs: 11/14/2018 TB Gold:  08/19/2018 negative   Okay to refill per Dr. Estanislado Pandy.

## 2019-02-22 ENCOUNTER — Telehealth: Payer: Self-pay | Admitting: Family Medicine

## 2019-02-22 NOTE — Telephone Encounter (Signed)
Pt spoke w/her rheumatologist.  Per pt, her heumatologist is "equivocal" about whether pt should have Prolia injection or not.  Rheumatologist says her jaw pain and thigh/joint pain could be due to her RA or the Prolia (pt states she experienced severe pain approximately 1 month after receiving Prolia.) Pt states she is "on the fence" with getting another Prolia injection.  She just "doesn't want that awful jaw pain and would like to know what you think.

## 2019-02-28 ENCOUNTER — Encounter: Payer: Self-pay | Admitting: Rheumatology

## 2019-03-09 NOTE — Telephone Encounter (Signed)
I would stop the Prolia- no guarantee that is it but the timing makes me suspicious

## 2019-03-14 NOTE — Telephone Encounter (Signed)
Pt agrees.  She will d/c Prolia.

## 2019-03-28 ENCOUNTER — Ambulatory Visit
Admission: RE | Admit: 2019-03-28 | Discharge: 2019-03-28 | Disposition: A | Discharge status: Home / Self Care | Payer: PPO | Source: Ambulatory Visit | Attending: General Surgery | Admitting: General Surgery

## 2019-03-28 ENCOUNTER — Other Ambulatory Visit: Payer: Self-pay

## 2019-03-28 DIAGNOSIS — Z1231 Encounter for screening mammogram for malignant neoplasm of breast: Secondary | ICD-10-CM

## 2019-04-04 ENCOUNTER — Ambulatory Visit: Payer: PPO | Admitting: General Surgery

## 2019-04-06 DIAGNOSIS — H353132 Nonexudative age-related macular degeneration, bilateral, intermediate dry stage: Secondary | ICD-10-CM | POA: Diagnosis not present

## 2019-04-28 NOTE — Progress Notes (Signed)
Office Visit Note  Patient: Samantha Valentine             Date of Birth: August 19, 1946           MRN: [id number]             PCP: Abner Greenspan, MD Referring: Tower, Wynelle Fanny, MD Visit Date: 05/10/2019 Occupation: @GUAROCC @  Subjective:  Intermittent pain in both hands    History of Present Illness: Samantha Valentine is a 73 y.o. female with history of seropositive rheumatoid arthritis, osteoarthritis, fibromyalgia, and DDD.  She is on Enbrel 50 mg sq injections every week.  She denies any recent rheumatoid arthritis flares.  She still feels as though Enbrel is effective.  She does have intermittent pain in bilateral hands but denies any joint swelling.  She has chronic pain in bilateral knee joints but no joint swelling.  She has not been able to walk as much as she would like to due to COVID-19.  She continues to have chronic neck and lower back pain.  She denies any symptoms of radiculopathy.  She states that she does have trapezius muscle tension and muscle tenderness bilaterally.  She denies any generalized muscle aches or muscle tenderness due to fibromyalgia at this point.    Activities of Daily Living:  Patient reports joint stiffness all day. Patient Reports nocturnal pain.  Difficulty dressing/grooming: Denies Difficulty climbing stairs: Reports Difficulty getting out of chair: Reports Difficulty using hands for taps, buttons, cutlery, and/or writing: Reports  Review of Systems  Constitutional: Positive for fatigue.  HENT: Positive for mouth dryness. Negative for mouth sores and nose dryness.   Eyes: Positive for dryness. Negative for pain and visual disturbance.  Respiratory: Positive for shortness of breath. Negative for cough, hemoptysis and difficulty breathing.   Cardiovascular: Positive for swelling in legs/feet. Negative for chest pain, palpitations and hypertension.  Gastrointestinal: Negative for blood in stool, constipation and diarrhea.  Endocrine: Negative for  increased urination.  Genitourinary: Negative for difficulty urinating and painful urination.  Musculoskeletal: Positive for arthralgias, gait problem, joint pain and morning stiffness. Negative for joint swelling, myalgias, muscle weakness, muscle tenderness and myalgias.  Skin: Negative for color change, pallor, rash, hair loss, nodules/bumps, skin tightness, ulcers and sensitivity to sunlight.  Allergic/Immunologic: Negative for susceptible to infections.  Neurological: Negative for dizziness, numbness, headaches and weakness.  Hematological: Negative for bruising/bleeding tendency and swollen glands.  Psychiatric/Behavioral: Positive for sleep disturbance. Negative for depressed mood. The patient is not nervous/anxious.     PMFS History:  Patient Active Problem List   Diagnosis Date Noted   Elevated glucose level 11/23/2018   Routine general medical examination at a health care facility 08/09/2018   DDD (degenerative disc disease), thoracic 06/28/2018   DDD (degenerative disc disease), cervical 06/28/2018   Atherosclerosis of aorta ([place]) 03/11/2018   Sleep disturbance 06/18/2017   Tachycardia 05/25/2017   Dysuria 05/25/2017   Estrogen deficiency 03/22/2017   Malignant neoplasm of upper-outer quadrant of left breast in female, estrogen receptor negative ([place]) 02/04/2017   Tendinopathy of right shoulder 01/25/2017   High risk medication use 09/29/2016   DJD (degenerative joint disease), cervical 09/29/2016   History of right hip replacement 09/29/2016   Eczema 07/13/2014   Adverse effect of immunosuppressive drug 04/27/2012   ANXIETY DEPRESSION 10/28/2008   Spinal stenosis 12/29/2007   Hypothyroidism 12/28/2007   Vitamin D deficiency 12/28/2007   HEARING LOSS 12/28/2007   Seropositive rheumatoid arthritis of multiple sites ([place]) 12/28/2007  DDD (degenerative disc disease), lumbar 12/28/2007   Fibromyalgia 12/28/2007   Osteoporosis 12/28/2007    MIGRAINES, HX OF 12/28/2007    Past Medical History:  Diagnosis Date   Anxiety    Arthritis    RA   Breast cancer ([place]) 01/29/2017   Breast mass 1 year    Cancer ([place]) 01/29/2017   left breast INVASIVE MAMMARY CARCINOMA, ER/PR positive   Cataract 2019   resolved with surgery   Collagen vascular disease ([place])    Rhematoid Arthritis   Complication of anesthesia    DDD (degenerative disc disease)    in neck   Depression    Dyspnea    Edema    FEET/LEGS   Emphysema of lung ([facility])    Fibromyalgia    GERD (gastroesophageal reflux disease)    NO MEDS   History of hiatal hernia    History of kidney stones    MULTIPLE KIDNEY STONES BIL   HOH (hard of hearing)    AIDS   Hypothyroidism    Macular degeneration, bilateral    Migraine    Osteoporosis    Palpitations    Personal history of chemotherapy    prior to mastectomy   PONV (postoperative nausea and vomiting)    AFTER FIRST CATARACT   Rheumatoid arthritis ([place])     Family History  Problem Relation Age of Onset   Alcohol abuse Father    Diabetes Father    Stroke Father    Hypertension Mother    Endometrial cancer Mother    Osteoarthritis Mother    Breast cancer Paternal Aunt 12   Liver disease Sister    Breast cancer Maternal Aunt    Rheum arthritis Maternal Grandmother    Diabetes Paternal Grandmother    Parkinson's disease Paternal Grandfather    Past Surgical History:  Procedure Laterality Date   BREAST BIOPSY Left 01/29/2017   US guided biopsy INVASIVE MAMMARY CARCINOMA   CATARACT EXTRACTION W/PHACO Left 04/28/2018   Procedure: CATARACT EXTRACTION PHACO AND INTRAOCULAR LENS PLACEMENT ([place]);  Surgeon: Leandrew Koyanagi, MD;  Location: [facility] ORS;  Service: Ophthalmology;  Laterality: Left;  Lot # [id number]   1:03 AP%   13.4 CDE    8.40   CATARACT EXTRACTION W/PHACO Right 06/09/2018   Procedure: CATARACT EXTRACTION PHACO AND INTRAOCULAR LENS PLACEMENT ([facility]);   Surgeon: Leandrew Koyanagi, MD;  Location: [facility] OR[facility];  Service: Ophthalmology;  Laterality: Right;  lot# [id number] [place] 0:55 ap 16.3% cde 8.21   HIP ARTHROPLASTY     JOINT REPLACEMENT Right 2003   total hip replacement   LITHOTRIPSY  1997   kidney stone   MASTECTOMY Left 01/29/2017   MASTECTOMY W/ SENTINEL NODE BIOPSY Left 05/18/2017   Procedure: MASTECTOMY WITH SENTINEL LYMPH NODE BIOPSY;  Surgeon: Robert Bellow, MD;  Location: [facility] OR[facility];  Service: General;  Laterality: Left;   PORT-A-CATH REMOVAL  09/2017   PORTACATH PLACEMENT Right 02/15/2017   Procedure: INSERTION PORT-A-CATH;  Surgeon: Robert Bellow, MD;  Location: [facility] OR[facility];  Service: General;  Laterality: Right;   TONSILLECTOMY  1970   Social History   Social History Narrative   Not on file   Immunization History  Administered Date(s) Administered   Influenza, High Dose Seasonal PF 07/28/2017, 07/17/2018   Influenza,inj,Quad PF,6+ Mos 07/05/2013, 07/13/2014, 08/20/2015   Influenza-Unspecified 07/19/2016, 07/28/2017, 07/17/2018   Pneumococcal Conjugate-13 08/20/2015   Pneumococcal Polysaccharide-23 11/03/2006, 07/05/2013   Td 05/03/2002     Objective: Vital Signs: BP 133/79 (BP Location: Right Arm,  Patient Position: Sitting, Cuff Size: Normal)    Pulse 83    Resp 18    Ht 5\' 4"  (1.626 m)    Wt 181 lb 12.8 oz (82.5 kg)    BMI 31.21 kg/m    Physical Exam Vitals signs and nursing note reviewed.  Constitutional:      Appearance: She is well-developed.  HENT:     Head: Normocephalic and atraumatic.  Eyes:     Conjunctiva/sclera: Conjunctivae normal.  Neck:     Musculoskeletal: Normal range of motion.  Cardiovascular:     Rate and Rhythm: Normal rate and regular rhythm.     Heart sounds: Normal heart sounds.  Pulmonary:     Effort: Pulmonary effort is normal.     Breath sounds: Normal breath sounds.  Abdominal:     General: Bowel sounds are normal.     Palpations: Abdomen is soft.    Lymphadenopathy:     Cervical: No cervical adenopathy.  Skin:    General: Skin is warm and dry.     Capillary Refill: Capillary refill takes less than 2 seconds.  Neurological:     Mental Status: She is alert and oriented to person, place, and time.  Psychiatric:        Behavior: Behavior normal.      Musculoskeletal Exam: C-spine good range of motion.  Mild thoracic kyphosis noted.  She has limited range of motion of lumbar spine.  No midline spinal tenderness at this time.  No SI joint tenderness.  Shoulder exam elbow joints, wrist joints, MCPs, PIPs, DIPs good range of motion no synovitis.  She has PIP and DIP synovial thickening consistent with osteoarthritis of bilateral hands.  Has bilateral CMC joint synovial thickening.  She has synovial thickening of the right second MCP joint.  Right hip replacement has good range of motion with no discomfort.  Left hip is good range of motion with no discomfort.  No tenderness over trochanteric bursa bilaterally.  Knee joints, ankle joints, MTPs and PIPs MTPs good range of motion no synovitis.  No warmth or effusion of bilateral knee joints.  No tenderness or swelling of ankle joints.  CDAI Exam: CDAI Score: 1  Patient Global: 5 mm; Provider Global: 5 mm Swollen: 0 ; Tender: 0  Joint Exam   No joint exam has been documented for this visit   There is currently no information documented on the homunculus. Go to the Rheumatology activity and complete the homunculus joint exam.  Investigation: No additional findings.  Imaging: No results found.  Recent Labs: Lab Results  Component Value Date   WBC 5.1 11/14/2018   HGB 14.9 11/14/2018   PLT 206.0 11/14/2018   NA 137 11/14/2018   K 4.6 11/14/2018   CL 102 11/14/2018   CO2 26 11/14/2018   GLUCOSE 102 (H) 11/14/2018   BUN 14 11/14/2018   CREATININE 0.72 11/14/2018   BILITOT 0.5 11/14/2018   ALKPHOS 63 11/14/2018   AST 15 11/14/2018   ALT 11 11/14/2018   PROT 6.9 11/14/2018    ALBUMIN 3.9 11/14/2018   CALCIUM 9.1 11/14/2018   GFRAA 92 08/19/2018   QFTBGOLD NEGATIVE 09/03/2017   QFTBGOLDPLUS NEGATIVE 08/19/2018    Speciality Comments: No specialty comments available.  Procedures:  No procedures performed Allergies: Adhesive [tape], Cymbalta [duloxetine hcl], Hydroxychloroquine sulfate, Ibandronate sodium, and Risedronate sodium    Assessment / Plan:     Visit Diagnoses: Seropositive rheumatoid arthritis of multiple sites ([place]) - Positive RF with erosive disease:  She has no synovitis on exam.  She denied any recent rheumatoid arthritis flares.  She is clinically doing well on Enbrel 50 mg subcutaneous injections every week.  She has intermittent discomfort in bilateral hands and bilateral feet but does not experience joint swelling.  She has chronic pain in bilateral knee joints but no warmth or effusion was noted on exam today.  Most of her joint pain is due to osteoarthritis.  She does have PIP and DIP synovial thickening in both hands and both feet.  She will continue on Enbrel 50 mg every days injections every week.  A refill of Enbrel will be sent to the pharmacy.  She was advised to notify us if she develops increased joint pain or joint swelling.  She will follow-up in the office in 5 months.  High risk medication use: Enbrel SureClick 50 mg every 7 days. (discontinued SSZ in the past due to constipation). Last TB gold negative on 08/19/2018 and will monitor yearly.  A future order for TB gold was placed today most recent CBC/CMP within normal limits on 11/14/2018.  She is overdue for labs.  CBC/CMP ordered for today and will monitor every 3 months.  Standing orders placed.  She received a high-dose flu vaccine in September and is up-to-date with pneumonia vaccines.  We discussed the importance of holding Enbrel anytime she has an infection and to resume once the infection is completely cleared.  We discussed the importance of social distancing and following the  standard precautions recommended by the CDC.  I recommended yearly skin exams while on Enbrel due to the increased risk of melanoma.  - Plan: CBC with Differential/Platelet, COMPLETE METABOLIC PANEL WITH GFR, CBC with Differential/Platelet, COMPLETE METABOLIC PANEL WITH GFR  Primary osteoarthritis of both hands - She has PIP and DIP synovial thickening consistent with osteoarthritis of bilateral hands.  She has bilateral CMC joint synovial thickening.  She has complete fist formation bilaterally.  She has no synovitis or tenderness on exam today.  Joint protection and muscle strengthening were discussed.  History of right hip replacement - Doing well.  She has good ROM with no discomfort.   DDD (degenerative disc disease), cervical - She has good range of motion with no discomfort at this time.  She experiences stiffness and discomfort intermittently.  She has no symptoms of radiculopathy at this time.  She does have trapezius muscle tension and muscle tenderness bilaterally.  DDD (degenerative disc disease), thoracic -No midline spinal tenderness.  Mild thoracic episodes noted.  DDD (degenerative disc disease), lumbar -She has limited range of motion.  No midline spinal tenderness at this time.  She has increased discomfort with activity.  Fibromyalgia - She has no generalized muscle aches or muscle tenderness at this time.  Her fibromyalgia has been very well controlled.  She does have some trapezius muscle tension and muscle tenderness bilaterally.  She takes Celebrex 200 mg 1 capsule by mouth twice daily PRN for pain relief.  She discontinued Cymbalta after developing headaches and elevated blood pressure.  She continues to have chronic fatigue related to insomnia.  She has difficulty falling asleep as well as staying asleep at night.  She feels as though her insomnia has been worsening.  We discussed good sleep hygiene.  A prescription for trazodone 50 mg 1 tablet by mouth at bedtime as needed for  insomnia was sent to the pharmacy today.  She was advised to notify us if she cannot tolerate it or if it is not  effective.  Age-related osteoporosis without current pathological fracture - DEXA scan on 05/06/17 showed T score of -2.4. Her DEXAs are ordered by Dr. Glori Bickers.  She is due to update DEXA. She has been treated with Forteo and Prolia in the past. She takes vitamin D 2,000 units po daily and obtains calcium from dietary sources.   Other medical conditions are listed as follows:  Malignant neoplasm of upper-outer quadrant of left breast in female, estrogen receptor negative ([place])   History of anxiety   History of hearing loss  History of migraine   History of depression   History of vitamin D deficiency   History of hypothyroidism  Orders: Orders Placed This Encounter  Procedures   CBC with Differential/Platelet   COMPLETE METABOLIC PANEL WITH GFR   CBC with Differential/Platelet   COMPLETE METABOLIC PANEL WITH GFR   QuantiFERON-TB Gold Plus   Meds ordered this encounter  Medications   traZODone (DESYREL) 50 MG tablet    Sig: Take 1 tablet (50 mg total) by mouth at bedtime.    Dispense:  30 tablet    Refill:  0    Face-to-face time spent with patient was 30 minutes. Greater than 50% of time was spent in counseling and coordination of care.  Follow-Up Instructions: Return in about 5 months (around 10/10/2019) for Rheumatoid arthritis, Osteoarthritis, Fibromyalgia.   Ofilia Neas, PA-C  Note - This record has been created using Dragon software.  Chart creation errors have been sought, but may not always  have been located. Such creation errors do not reflect on  the standard of medical care.

## 2019-05-10 ENCOUNTER — Ambulatory Visit (INDEPENDENT_AMBULATORY_CARE_PROVIDER_SITE_OTHER): Payer: PPO | Admitting: Physician Assistant

## 2019-05-10 ENCOUNTER — Other Ambulatory Visit: Payer: Self-pay

## 2019-05-10 ENCOUNTER — Encounter: Payer: Self-pay | Admitting: Physician Assistant

## 2019-05-10 VITALS — BP 133/79 | HR 83 | Resp 18 | Ht 64.0 in | Wt 181.8 lb

## 2019-05-10 DIAGNOSIS — M810 Age-related osteoporosis without current pathological fracture: Secondary | ICD-10-CM

## 2019-05-10 DIAGNOSIS — Z8669 Personal history of other diseases of the nervous system and sense organs: Secondary | ICD-10-CM

## 2019-05-10 DIAGNOSIS — M19041 Primary osteoarthritis, right hand: Secondary | ICD-10-CM

## 2019-05-10 DIAGNOSIS — M797 Fibromyalgia: Secondary | ICD-10-CM

## 2019-05-10 DIAGNOSIS — C50412 Malignant neoplasm of upper-outer quadrant of left female breast: Secondary | ICD-10-CM | POA: Diagnosis not present

## 2019-05-10 DIAGNOSIS — M5136 Other intervertebral disc degeneration, lumbar region: Secondary | ICD-10-CM | POA: Diagnosis not present

## 2019-05-10 DIAGNOSIS — Z96641 Presence of right artificial hip joint: Secondary | ICD-10-CM

## 2019-05-10 DIAGNOSIS — Z8639 Personal history of other endocrine, nutritional and metabolic disease: Secondary | ICD-10-CM

## 2019-05-10 DIAGNOSIS — Z8659 Personal history of other mental and behavioral disorders: Secondary | ICD-10-CM

## 2019-05-10 DIAGNOSIS — M5134 Other intervertebral disc degeneration, thoracic region: Secondary | ICD-10-CM

## 2019-05-10 DIAGNOSIS — M5030 Other cervical disc degeneration, unspecified cervical region: Secondary | ICD-10-CM

## 2019-05-10 DIAGNOSIS — Z79899 Other long term (current) drug therapy: Secondary | ICD-10-CM | POA: Diagnosis not present

## 2019-05-10 DIAGNOSIS — M19042 Primary osteoarthritis, left hand: Secondary | ICD-10-CM

## 2019-05-10 DIAGNOSIS — M0579 Rheumatoid arthritis with rheumatoid factor of multiple sites without organ or systems involvement: Secondary | ICD-10-CM

## 2019-05-10 DIAGNOSIS — Z171 Estrogen receptor negative status [ER-]: Secondary | ICD-10-CM

## 2019-05-10 MED ORDER — TRAZODONE HCL 50 MG PO TABS: 50.0000 mg | ORAL_TABLET | Freq: Every day | ORAL | 0 refills | Status: DC

## 2019-05-10 NOTE — Patient Instructions (Signed)
Standing Labs We placed an order today for your standing lab work.    Please come back and get your standing labs in October and every 3 months   TB gold, CBC, and CMP   We have open lab daily Monday through Thursday from 8:30-12:30 PM and 1:30-4:30 PM and Friday from 8:30-12:30 PM and 1:30 -4:00 PM at the office of Dr. Bo Merino.   You may experience shorter wait times on Monday and Friday afternoons. The office is located at [address] [place], [place], [place] [address] No appointment is necessary.   Labs are drawn by [facility].  You may receive a bill from [facility] for your lab work.  If you wish to have your labs drawn at another location, please call the office 24 hours in advance to send orders.  If you have any questions regarding directions or hours of operation,  please call [phone].   Just as a reminder please drink plenty of water prior to coming for your lab work. Thanks!

## 2019-05-11 LAB — COMPLETE METABOLIC PANEL WITH GFR
AG Ratio: 1.3 (calc) (ref 1.0–2.5)
ALT: 11 U/L (ref 6–29)
AST: 14 U/L (ref 10–35)
Albumin: 3.6 g/dL (ref 3.6–5.1)
Alkaline phosphatase (APISO): 86 U/L (ref 37–153)
BUN: 18 mg/dL (ref 7–25)
CO2: 27 mmol/L (ref 20–32)
Calcium: 9 mg/dL (ref 8.6–10.4)
Chloride: 105 mmol/L (ref 98–110)
Creat: 0.74 mg/dL (ref 0.60–0.93)
GFR, Est African American: 94 mL/min/{1.73_m2} (ref >=60)
GFR, Est Non African American: 82 mL/min/{1.73_m2} (ref >=60)
Globulin: 2.7 g/dL (calc) (ref 1.9–3.7)
Glucose, Bld: 95 mg/dL (ref 65–99)
Potassium: 4.2 mmol/L (ref 3.5–5.3)
Sodium: 139 mmol/L (ref 135–146)
Total Bilirubin: 0.5 mg/dL (ref 0.2–1.2)
Total Protein: 6.3 g/dL (ref 6.1–8.1)

## 2019-05-11 LAB — CBC WITH DIFFERENTIAL/PLATELET
Absolute Monocytes: 725 cells/uL (ref 200–950)
Basophils Absolute: 38 cells/uL (ref 0–200)
Basophils Relative: 0.6 %
Eosinophils Absolute: 202 cells/uL (ref 15–500)
Eosinophils Relative: 3.2 %
HCT: 43.8 % (ref 35.0–45.0)
Hemoglobin: 14.5 g/dL (ref 11.7–15.5)
Lymphs Abs: 1808 cells/uL (ref 850–3900)
MCH: 30.2 pg (ref 27.0–33.0)
MCHC: 33.1 g/dL (ref 32.0–36.0)
MCV: 91.3 fL (ref 80.0–100.0)
MPV: 11.8 fL (ref 7.5–12.5)
Monocytes Relative: 11.5 %
Neutro Abs: 3528 cells/uL (ref 1500–7800)
Neutrophils Relative %: 56 %
Platelets: 219 10*3/uL (ref 140–400)
RBC: 4.8 10*6/uL (ref 3.80–5.10)
RDW: 12.7 % (ref 11.0–15.0)
Total Lymphocyte: 28.7 %
WBC: 6.3 10*3/uL (ref 3.8–10.8)

## 2019-05-11 NOTE — Progress Notes (Signed)
CBC and CMP WNL

## 2019-05-18 IMAGING — MG MM DIGITAL SCREENING UNILAT*R* W/ CAD
2 series · 2 of 2 positions shown · non-contrast
Comparison: Previous exam(s).

CLINICAL DATA: Screening.

EXAM:
DIGITAL SCREENING UNILATERAL RIGHT MAMMOGRAM WITH CAD

[R MLO]
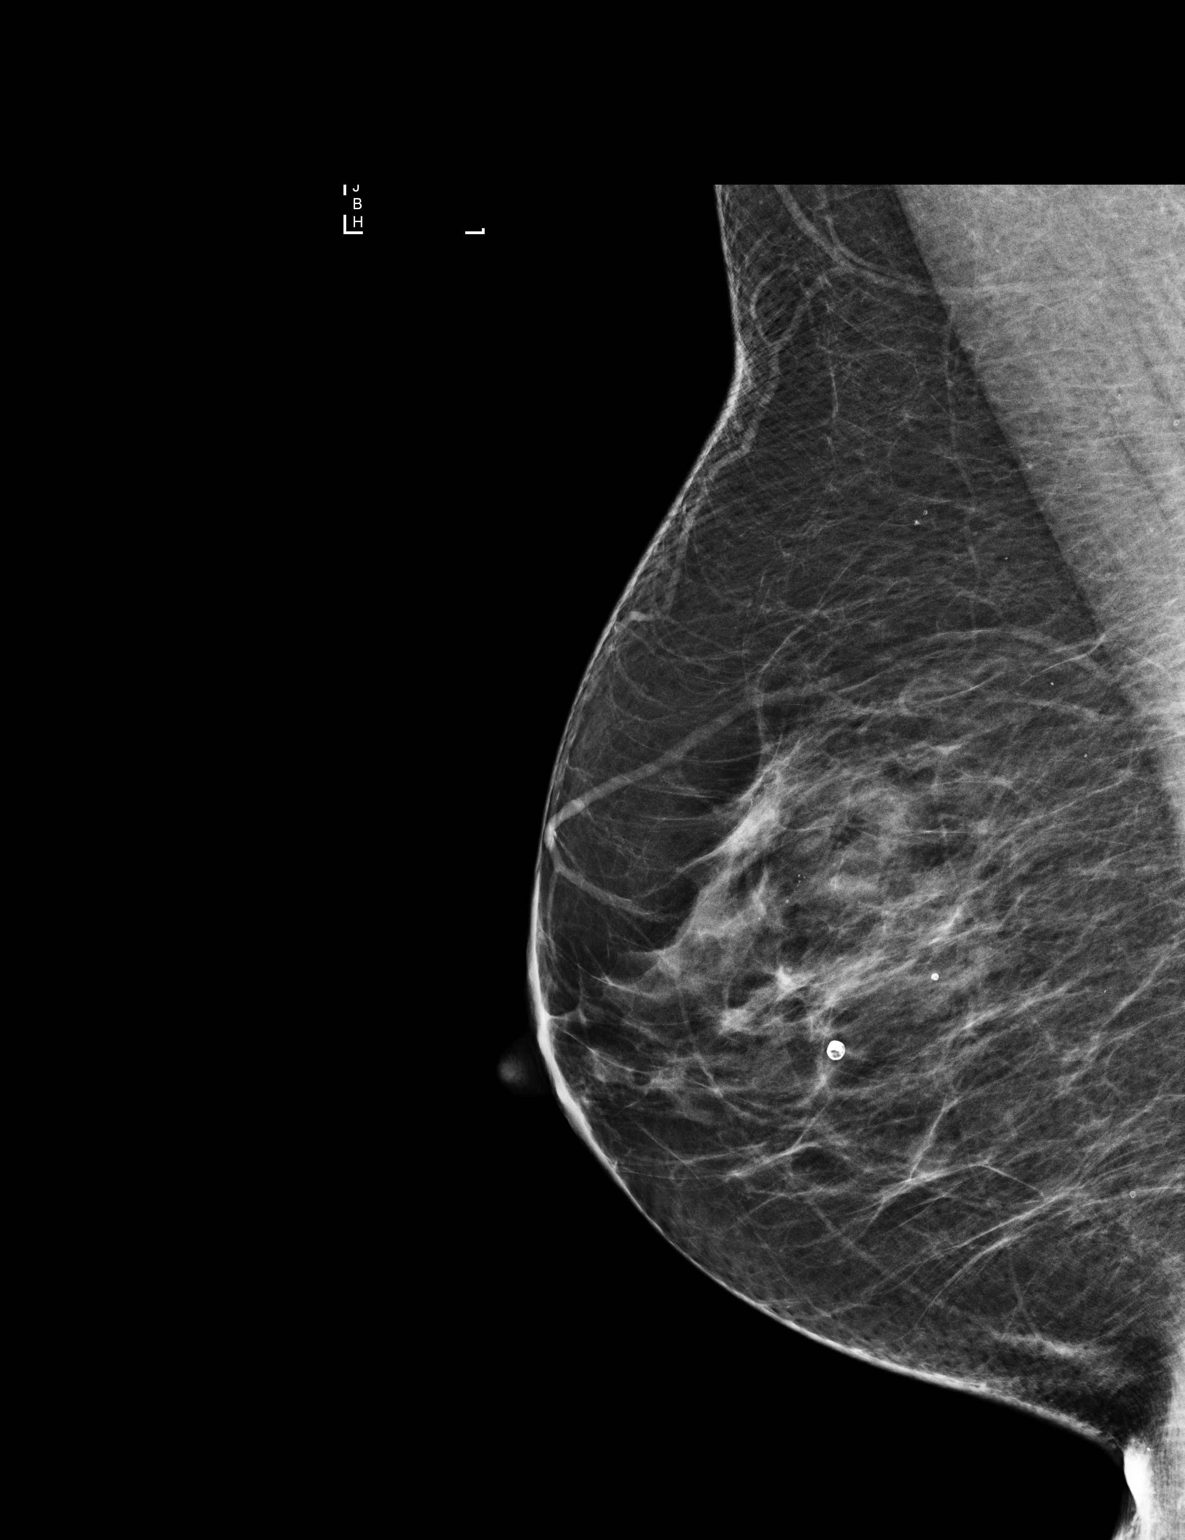

[R CC]
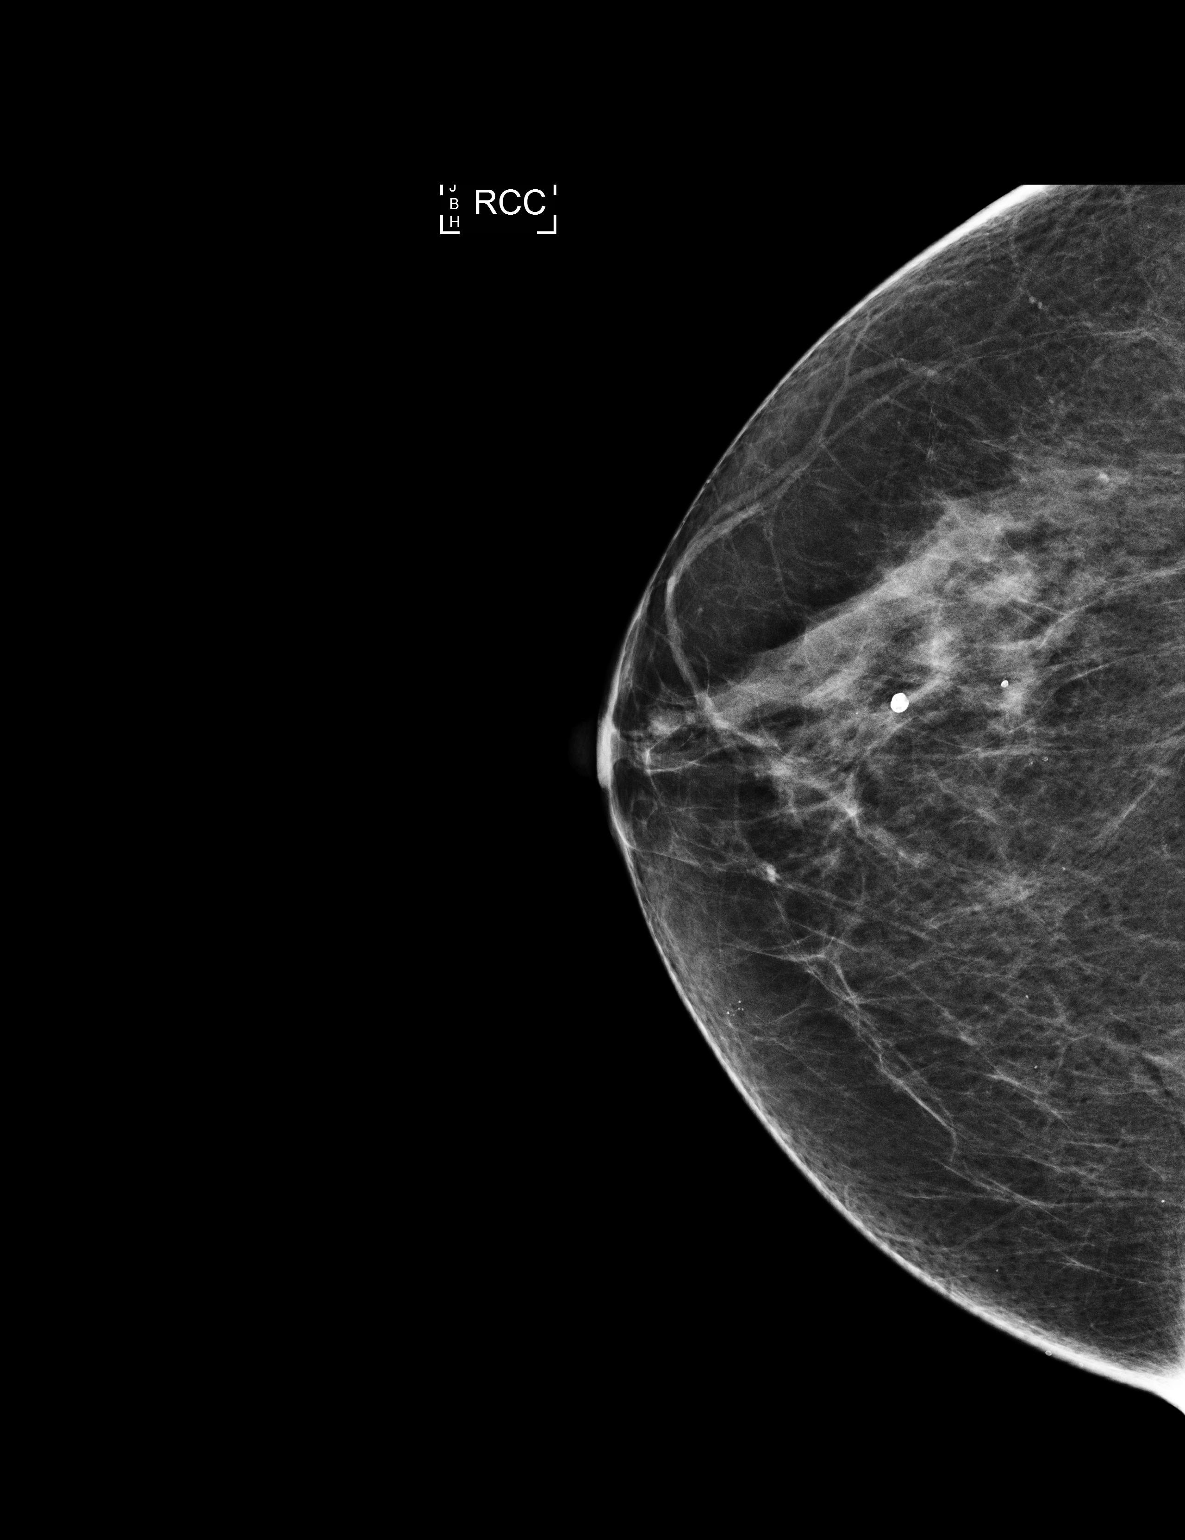

[2 of 2 positions shown; findings below may reference images not displayed]

ACR Breast Density Category c: The breast tissue is heterogeneously
dense, which may obscure small masses.
FINDINGS: The patient has had a left mastectomy. There are no findings
suspicious for malignancy. Images were processed with CAD.
IMPRESSION: No mammographic evidence of malignancy. A result letter of this
screening mammogram will be mailed directly to the patient.

RECOMMENDATION:
Screening mammogram in one year.  (Code:S7-M-1L7)

BI-RADS CATEGORY  1: Negative.

## 2019-08-18 ENCOUNTER — Other Ambulatory Visit: Payer: Self-pay

## 2019-08-18 DIAGNOSIS — Z79899 Other long term (current) drug therapy: Secondary | ICD-10-CM

## 2019-08-20 ENCOUNTER — Encounter: Payer: Self-pay | Admitting: Rheumatology

## 2019-08-20 LAB — QUANTIFERON-TB GOLD PLUS
Mitogen-NIL: 8.36 IU/mL
NIL: 0.03 IU/mL
QuantiFERON-TB Gold Plus: NEGATIVE
TB1-NIL: 0 IU/mL
TB2-NIL: 0 IU/mL

## 2019-08-20 LAB — CBC WITH DIFFERENTIAL/PLATELET
Absolute Monocytes: 454 cells/uL (ref 200–950)
Basophils Absolute: 32 cells/uL (ref 0–200)
Basophils Relative: 0.5 %
Eosinophils Absolute: 151 cells/uL (ref 15–500)
Eosinophils Relative: 2.4 %
HCT: 44.5 % (ref 35.0–45.0)
Hemoglobin: 14.7 g/dL (ref 11.7–15.5)
Lymphs Abs: 1392 cells/uL (ref 850–3900)
MCH: 30.1 pg (ref 27.0–33.0)
MCHC: 33 g/dL (ref 32.0–36.0)
MCV: 91.2 fL (ref 80.0–100.0)
MPV: 12.2 fL (ref 7.5–12.5)
Monocytes Relative: 7.2 %
Neutro Abs: 4271 cells/uL (ref 1500–7800)
Neutrophils Relative %: 67.8 %
Platelets: 236 10*3/uL (ref 140–400)
RBC: 4.88 10*6/uL (ref 3.80–5.10)
RDW: 12.7 % (ref 11.0–15.0)
Total Lymphocyte: 22.1 %
WBC: 6.3 10*3/uL (ref 3.8–10.8)

## 2019-08-20 LAB — COMPLETE METABOLIC PANEL WITH GFR
AG Ratio: 1.4 (calc) (ref 1.0–2.5)
ALT: 16 U/L (ref 6–29)
AST: 32 U/L (ref 10–35)
Albumin: 3.8 g/dL (ref 3.6–5.1)
Alkaline phosphatase (APISO): 96 U/L (ref 37–153)
BUN: 15 mg/dL (ref 7–25)
CO2: 23 mmol/L (ref 20–32)
Calcium: 9 mg/dL (ref 8.6–10.4)
Chloride: 106 mmol/L (ref 98–110)
Creat: 0.67 mg/dL (ref 0.60–0.93)
GFR, Est African American: 102 mL/min/{1.73_m2} (ref >=60)
GFR, Est Non African American: 88 mL/min/{1.73_m2} (ref >=60)
Globulin: 2.8 g/dL (calc) (ref 1.9–3.7)
Glucose, Bld: 97 mg/dL (ref 65–99)
Potassium: 4.1 mmol/L (ref 3.5–5.3)
Sodium: 140 mmol/L (ref 135–146)
Total Bilirubin: 0.4 mg/dL (ref 0.2–1.2)
Total Protein: 6.6 g/dL (ref 6.1–8.1)

## 2019-08-21 MED ORDER — ENBREL SURECLICK 50 MG/ML ~~LOC~~ SOAJ: 50.0000 mg | SUBCUTANEOUS | 0 refills | Status: DC

## 2019-08-21 NOTE — Progress Notes (Signed)
CBC and CMP WNL.  TB gold negative.

## 2019-08-21 NOTE — Telephone Encounter (Signed)
Last Visit: 05/10/19 Next Visit: 10/10/19 Labs: 08/18/19 WNL TB Gold: 08/18/19 Neg   Okay to refill per Dr. Estanislado Pandy

## 2019-08-23 ENCOUNTER — Telehealth: Payer: Self-pay | Admitting: Rheumatology

## 2019-08-23 MED ORDER — ENBREL SURECLICK 50 MG/ML ~~LOC~~ SOAJ: 50.0000 mg | SUBCUTANEOUS | 0 refills | Status: DC

## 2019-08-23 NOTE — Telephone Encounter (Signed)
Prescription resent to the pharmacy.  

## 2019-08-23 NOTE — Telephone Encounter (Signed)
Legrand Como from [employer] called stating they are having a problem with their computer system and patient's prescription of Enbrel is not showing up.  Please resend the prescription.

## 2019-09-01 ENCOUNTER — Encounter: Payer: Self-pay | Admitting: Rheumatology

## 2019-09-04 ENCOUNTER — Telehealth: Payer: Self-pay | Admitting: Pharmacy Technician

## 2019-09-04 NOTE — Telephone Encounter (Signed)
Started Financial risk analyst for patient. Now awaiting patient to stop by office to sign and bring income documents.  10:19 AM Beatriz Chancellor, CPhT

## 2019-09-07 NOTE — Telephone Encounter (Signed)
Completed application faxed to [employer].  Will update when we hear a response.   Mariella Saa, PharmD, [place], [place] Clinical Specialty Pharmacist [phone]  09/07/2019 9:14 AM

## 2019-09-18 NOTE — Telephone Encounter (Signed)
Received fax from [employer], application has been APPROVED. Coverage is from 10/20/19 to 10/18/20.  Will send document to [facility].  Phone# [phone] Fax# [phone]

## 2019-10-05 ENCOUNTER — Other Ambulatory Visit: Payer: Self-pay | Admitting: Rheumatology

## 2019-10-05 MED ORDER — CELECOXIB 200 MG PO CAPS: ORAL_CAPSULE | ORAL | 0 refills | Status: DC

## 2019-10-05 NOTE — Telephone Encounter (Signed)
Ok to refill 

## 2019-10-05 NOTE — Telephone Encounter (Signed)
Last Visit: 05/10/2019 Next Visit: 12/06/2019 Labs: 08/18/2019 CBC and CMP WNL.  Last fill: 12/06/2018 (135 capsules)  Okay to refill celebrex?

## 2019-10-05 NOTE — Telephone Encounter (Signed)
Patient called requesting prescription refill of Celebrex to be sent to [facility] at [address] in [place].

## 2019-10-10 ENCOUNTER — Ambulatory Visit: Payer: PPO | Admitting: Rheumatology

## 2019-10-30 ENCOUNTER — Ambulatory Visit: Payer: Medicare Other | Attending: Internal Medicine

## 2019-10-30 DIAGNOSIS — Z23 Encounter for immunization: Secondary | ICD-10-CM

## 2019-10-30 NOTE — Progress Notes (Signed)
   Covid-19 Vaccination Clinic  Name:  Naylin Perazzo    MRN: [id number] DOB: July 03, 1946  10/30/2019  Ms. Perrell was observed post Covid-19 immunization for 15 minutes without incidence. She was provided with Vaccine Information Sheet and instruction to access the V-Safe system.   Ms. Ramakrishnan was instructed to call 911 with any severe reactions post vaccine: Marland Kitchen Difficulty breathing  . Swelling of your face and throat  . A fast heartbeat  . A bad rash all over your body  . Dizziness and weakness    Immunizations Administered    Name Date Dose VIS Date Route   Pfizer COVID-19 Vaccine 10/30/2019 10:30 AM 0.3 mL 09/29/2019 Intramuscular   Manufacturer: [employer]   Lot: [id number]   [place]: [id number]

## 2019-11-09 ENCOUNTER — Other Ambulatory Visit: Payer: Self-pay

## 2019-11-09 DIAGNOSIS — Z79899 Other long term (current) drug therapy: Secondary | ICD-10-CM

## 2019-11-10 ENCOUNTER — Other Ambulatory Visit: Payer: Self-pay | Admitting: *Deleted

## 2019-11-10 LAB — COMPLETE METABOLIC PANEL WITH GFR
AG Ratio: 1.4 (calc) (ref 1.0–2.5)
ALT: 10 U/L (ref 6–29)
AST: 11 U/L (ref 10–35)
Albumin: 3.8 g/dL (ref 3.6–5.1)
Alkaline phosphatase (APISO): 96 U/L (ref 37–153)
BUN: 21 mg/dL (ref 7–25)
CO2: 26 mmol/L (ref 20–32)
Calcium: 9.3 mg/dL (ref 8.6–10.4)
Chloride: 104 mmol/L (ref 98–110)
Creat: 0.75 mg/dL (ref 0.60–0.93)
GFR, Est African American: 92 mL/min/{1.73_m2} (ref >=60)
GFR, Est Non African American: 80 mL/min/{1.73_m2} (ref >=60)
Globulin: 2.7 g/dL (calc) (ref 1.9–3.7)
Glucose, Bld: 94 mg/dL (ref 65–99)
Potassium: 4.7 mmol/L (ref 3.5–5.3)
Sodium: 138 mmol/L (ref 135–146)
Total Bilirubin: 0.3 mg/dL (ref 0.2–1.2)
Total Protein: 6.5 g/dL (ref 6.1–8.1)

## 2019-11-10 LAB — CBC WITH DIFFERENTIAL/PLATELET
Absolute Monocytes: 536 cells/uL (ref 200–950)
Basophils Absolute: 56 cells/uL (ref 0–200)
Basophils Relative: 0.7 %
Eosinophils Absolute: 288 cells/uL (ref 15–500)
Eosinophils Relative: 3.6 %
HCT: 44.2 % (ref 35.0–45.0)
Hemoglobin: 14.7 g/dL (ref 11.7–15.5)
Lymphs Abs: 2160 cells/uL (ref 850–3900)
MCH: 30.4 pg (ref 27.0–33.0)
MCHC: 33.3 g/dL (ref 32.0–36.0)
MCV: 91.3 fL (ref 80.0–100.0)
MPV: 11.4 fL (ref 7.5–12.5)
Monocytes Relative: 6.7 %
Neutro Abs: 4960 cells/uL (ref 1500–7800)
Neutrophils Relative %: 62 %
Platelets: 266 10*3/uL (ref 140–400)
RBC: 4.84 10*6/uL (ref 3.80–5.10)
RDW: 12.7 % (ref 11.0–15.0)
Total Lymphocyte: 27 %
WBC: 8 10*3/uL (ref 3.8–10.8)

## 2019-11-10 MED ORDER — ENBREL SURECLICK 50 MG/ML ~~LOC~~ SOAJ: 50.0000 mg | SUBCUTANEOUS | 0 refills | Status: DC

## 2019-11-10 NOTE — Progress Notes (Signed)
CBC and CMP WNL

## 2019-11-10 NOTE — Telephone Encounter (Signed)
Patient requesting a refill on Enbrel.  Last Visit: 05/10/19 Next Visit: 12/06/19 Labs: 11/09/19 WNL TB Gold: 08/18/19 Neg   Okay to refill per Dr. Estanislado Pandy

## 2019-11-16 ENCOUNTER — Other Ambulatory Visit: Payer: Self-pay | Admitting: Family Medicine

## 2019-11-16 NOTE — Telephone Encounter (Signed)
Please schedule PE or f/u in April and refill until then

## 2019-11-16 NOTE — Telephone Encounter (Signed)
Pt cancelled her CPE with [place] and no future appts., no recent TSH level, please advise

## 2019-11-17 ENCOUNTER — Ambulatory Visit: Payer: PPO

## 2019-11-17 NOTE — Telephone Encounter (Signed)
Med refilled once and Carrie will reach out to pt to try and get CPE scheduled  

## 2019-11-18 ENCOUNTER — Ambulatory Visit: Payer: PPO | Attending: Internal Medicine

## 2019-11-18 DIAGNOSIS — Z23 Encounter for immunization: Secondary | ICD-10-CM | POA: Insufficient documentation

## 2019-11-18 NOTE — Progress Notes (Signed)
   Covid-19 Vaccination Clinic  Name:  Samantha Valentine    MRN: [id number] DOB: [birth year]  11/18/2019  [name] was observed post Covid-19 immunization for 15 minutes without incidence. She was provided with Vaccine Information Sheet and instruction to access the V-Safe system.   [name] was instructed to call 911 with any severe reactions post vaccine: Marland Kitchen Difficulty breathing  . Swelling of your face and throat  . A fast heartbeat  . A bad rash all over your body  . Dizziness and weakness    Immunizations Administered    Name Date Dose VIS Date Route   Pfizer COVID-19 Vaccine 11/18/2019  1:03 PM 0.3 mL 09/29/2019 Intramuscular   Manufacturer: [place]   Lot: [id number]   [place]: [id number]

## 2019-11-21 ENCOUNTER — Ambulatory Visit: Payer: PPO

## 2019-11-28 ENCOUNTER — Encounter: Payer: PPO | Admitting: Family Medicine

## 2019-12-06 ENCOUNTER — Ambulatory Visit: Payer: PPO | Admitting: Physician Assistant

## 2019-12-14 DIAGNOSIS — H353132 Nonexudative age-related macular degeneration, bilateral, intermediate dry stage: Secondary | ICD-10-CM | POA: Diagnosis not present

## 2019-12-25 NOTE — Progress Notes (Signed)
Office Visit Note  Patient: Samantha Valentine             Date of Birth: 1946/03/16           MRN: [id number]             PCP: Abner Greenspan, MD Referring: Tower, Wynelle Fanny, MD Visit Date: 01/02/2020 Occupation: @GUAROCC @  Subjective:  Joint stiffness    History of Present Illness: Samantha Valentine is a 74 y.o. female with history of seropositive rheumatoid arthritis, osteoarthritis, and DDD.  Patient is on Enbrel 50 mg subcutaneous injections once weekly.  She received both COVID-19 vaccinations in January 2021 and during that time held Enbrel 1 week prior to each injection.  She has not missed any recent Enbrel injections.  She is not had any recent infections.  She continues to have generalized arthralgias and joint stiffness.  She states 2 days ago she experienced right knee joint pain and swelling which has resolved.  She continues to have chronic C-spine, thoracic spine, and lumbar spine discomfort.  She has no symptoms of radiculopathy at this time.  She states that her discomfort due to fibromyalgia has been very tolerable.  She continues to have chronic fatigue secondary to insomnia.  She experiences nocturnal pain at night.   Activities of Daily Living:  Patient reports joint stiffness all day  Patient Reports nocturnal pain.  Difficulty dressing/grooming: Reports Difficulty climbing stairs: Reports Difficulty getting out of chair: Reports Difficulty using hands for taps, buttons, cutlery, and/or writing: Reports  Review of Systems  Constitutional: Positive for fatigue.  HENT: Positive for mouth dryness. Negative for mouth sores and nose dryness.   Eyes: Positive for dryness. Negative for pain and visual disturbance.  Respiratory: Negative for cough, hemoptysis, shortness of breath and difficulty breathing.   Cardiovascular: Positive for swelling in legs/feet. Negative for chest pain, palpitations and hypertension.  Gastrointestinal: Negative for blood in stool,  constipation and diarrhea.  Endocrine: Negative for excessive thirst and increased urination.  Genitourinary: Negative for difficulty urinating and painful urination.  Musculoskeletal: Positive for arthralgias, joint pain, joint swelling, morning stiffness and muscle tenderness. Negative for myalgias, muscle weakness and myalgias.  Skin: Negative for color change, pallor, rash, hair loss, nodules/bumps, skin tightness, ulcers and sensitivity to sunlight.  Allergic/Immunologic: Negative for susceptible to infections.  Neurological: Negative for dizziness, headaches and weakness.  Hematological: Negative for bruising/bleeding tendency and swollen glands.  Psychiatric/Behavioral: Positive for sleep disturbance. Negative for depressed mood. The patient is not nervous/anxious.     PMFS History:  Patient Active Problem List   Diagnosis Date Noted  . Elevated glucose level 11/23/2018  . Routine general medical examination at a health care facility 08/09/2018  . DDD (degenerative disc disease), thoracic 06/28/2018  . DDD (degenerative disc disease), cervical 06/28/2018  . Atherosclerosis of aorta ([place]) 03/11/2018  . Sleep disturbance 06/18/2017  . Tachycardia 05/25/2017  . Dysuria 05/25/2017  . Estrogen deficiency 03/22/2017  . Malignant neoplasm of upper-outer quadrant of left breast in female, estrogen receptor negative ([place]) 02/04/2017  . Tendinopathy of right shoulder 01/25/2017  . High risk medication use 09/29/2016  . DJD (degenerative joint disease), cervical 09/29/2016  . History of right hip replacement 09/29/2016  . Eczema 07/13/2014  . Adverse effect of immunosuppressive drug 04/27/2012  . ANXIETY DEPRESSION 10/28/2008  . Spinal stenosis 12/29/2007  . Hypothyroidism 12/28/2007  . Vitamin D deficiency 12/28/2007  . HEARING LOSS 12/28/2007  . Seropositive rheumatoid arthritis of multiple sites ([place])  12/28/2007  . DDD (degenerative disc disease), lumbar 12/28/2007  . Fibromyalgia  12/28/2007  . Osteoporosis 12/28/2007  . MIGRAINES, HX OF 12/28/2007    Past Medical History:  Diagnosis Date  . Anxiety   . Arthritis    RA  . Breast cancer ([place]) 01/29/2017  . Breast mass 1 year   . Cancer ([place]) 01/29/2017   left breast INVASIVE MAMMARY CARCINOMA, ER/PR positive  . Cataract 2019   resolved with surgery  . Collagen vascular disease ([facility])    Rhematoid Arthritis  . Complication of anesthesia   . DDD (degenerative disc disease)    in neck  . Depression   . Dyspnea   . Edema    FEET/LEGS  . Emphysema of lung ([place])   . Fibromyalgia   . GERD (gastroesophageal reflux disease)    NO MEDS  . History of hiatal hernia   . History of kidney stones    MULTIPLE KIDNEY STONES BIL  . HOH (hard of hearing)    AIDS  . Hypothyroidism   . Macular degeneration, bilateral   . Migraine   . Osteoporosis   . Palpitations   . Personal history of chemotherapy    prior to mastectomy  . PONV (postoperative nausea and vomiting)    AFTER FIRST CATARACT  . Rheumatoid arthritis ([place])     Family History  Problem Relation Age of Onset  . Alcohol abuse Father   . Diabetes Father   . Stroke Father   . Hypertension Mother   . Endometrial cancer Mother   . Osteoarthritis Mother   . Breast cancer Paternal Aunt 44  . Liver disease Sister   . Breast cancer Maternal Aunt   . Rheum arthritis Maternal Grandmother   . Diabetes Paternal Grandmother   . Parkinson's disease Paternal Grandfather    Past Surgical History:  Procedure Laterality Date  . BREAST BIOPSY Left 01/29/2017   US guided biopsy INVASIVE MAMMARY CARCINOMA  . CATARACT EXTRACTION W/PHACO Left 04/28/2018   Procedure: CATARACT EXTRACTION PHACO AND INTRAOCULAR LENS PLACEMENT (IOC);  Surgeon: Leandrew Koyanagi, MD;  Location: [facility] ORS;  Service: Ophthalmology;  Laterality: Left;  Lot # [id number]   1:03 AP%   13.4 CDE    8.40  . CATARACT EXTRACTION W/PHACO Right 06/09/2018   Procedure: CATARACT EXTRACTION PHACO  AND INTRAOCULAR LENS PLACEMENT (IOC);  Surgeon: Leandrew Koyanagi, MD;  Location: [facility] ORS;  Service: Ophthalmology;  Laterality: Right;  lot# [id number] [place] 0:55 ap 16.3% cde 8.21  . HIP ARTHROPLASTY    . JOINT REPLACEMENT Right 2003   total hip replacement  . LITHOTRIPSY  1997   kidney stone  . MASTECTOMY Left 01/29/2017  . MASTECTOMY W/ SENTINEL NODE BIOPSY Left 05/18/2017   Procedure: MASTECTOMY WITH SENTINEL LYMPH NODE BIOPSY;  Surgeon: Robert Bellow, MD;  Location: [facility] ORS;  Service: General;  Laterality: Left;  . PORT-A-CATH REMOVAL  09/2017  . PORTACATH PLACEMENT Right 02/15/2017   Procedure: INSERTION PORT-A-CATH;  Surgeon: Robert Bellow, MD;  Location: [facility] ORS;  Service: General;  Laterality: Right;  . TONSILLECTOMY  1970   Social History   Social History Narrative  . Not on file   Immunization History  Administered Date(s) Administered  . Influenza, High Dose Seasonal PF 07/28/2017, 07/17/2018  . Influenza,inj,Quad PF,6+ Mos 07/05/2013, 07/13/2014, 08/20/2015  . Influenza-Unspecified 07/19/2016, 07/28/2017, 07/17/2018  . PFIZER SARS-COV-2 Vaccination 10/30/2019, 11/18/2019  . Pneumococcal Conjugate-13 08/20/2015  . Pneumococcal Polysaccharide-23 11/03/2006, 07/05/2013  . Td 05/03/2002  Objective: Vital Signs: BP (!) 144/82 (BP Location: Right Arm, Patient Position: Sitting, Cuff Size: Normal)   Pulse 79   Resp 16   Ht 5\' 4"  (1.626 m)   Wt 182 lb (82.6 kg)   BMI 31.24 kg/m    Physical Exam Vitals and nursing note reviewed.  Constitutional:      Appearance: She is well-developed.  HENT:     Head: Normocephalic and atraumatic.  Eyes:     Conjunctiva/sclera: Conjunctivae normal.  Pulmonary:     Effort: Pulmonary effort is normal.  Abdominal:     General: Bowel sounds are normal.     Palpations: Abdomen is soft.  Musculoskeletal:     Cervical back: Normal range of motion.  Lymphadenopathy:     Cervical: No cervical adenopathy.  Skin:     General: Skin is warm and dry.     Capillary Refill: Capillary refill takes less than 2 seconds.  Neurological:     Mental Status: She is alert and oriented to person, place, and time.  Psychiatric:        Behavior: Behavior normal.      Musculoskeletal Exam: C-spine slightly limited lateral rotation to the left.  Thoracic and lumbar spine good range of motion.  Midline spinal tenderness of the C-spine, thoracic, lumbar spine.  Shoulder joints, elbow joints, wrist joints, MCPs, PIPs, DIPs good range of motion with no synovitis.  She has complete fist formation bilaterally.  Right hip replacement has good range of motion.  Left hip has good range of motion with no discomfort.  Knee joints have good range of motion with no warmth or effusion today.  Ankle joints have good range of motion with no tenderness or inflammation.  CDAI Exam: CDAI Score: 0.4  Patient Global: 2 mm; Provider Global: 2 mm Swollen: 0 ; Tender: 0  Joint Exam 01/02/2020   No joint exam has been documented for this visit   There is currently no information documented on the homunculus. Go to the Rheumatology activity and complete the homunculus joint exam.  Investigation: No additional findings.  Imaging: No results found.  Recent Labs: Lab Results  Component Value Date   WBC 8.0 11/09/2019   HGB 14.7 11/09/2019   PLT 266 11/09/2019   NA 138 11/09/2019   K 4.7 11/09/2019   CL 104 11/09/2019   CO2 26 11/09/2019   GLUCOSE 94 11/09/2019   BUN 21 11/09/2019   CREATININE 0.75 11/09/2019   BILITOT 0.3 11/09/2019   ALKPHOS 63 11/14/2018   AST 11 11/09/2019   ALT 10 11/09/2019   PROT 6.5 11/09/2019   ALBUMIN 3.9 11/14/2018   CALCIUM 9.3 11/09/2019   GFRAA 92 11/09/2019   QFTBGOLD NEGATIVE 09/03/2017   QFTBGOLDPLUS NEGATIVE 08/18/2019    Speciality Comments: No specialty comments available.  Procedures:  No procedures performed Allergies: Adhesive [tape], Cymbalta [duloxetine hcl], Hydroxychloroquine  sulfate, Ibandronate sodium, and Risedronate sodium   Assessment / Plan:     Visit Diagnoses: Seropositive rheumatoid arthritis of multiple sites ([place]) - Positive RF with erosive disease: She has no synovitis on exam.  She experiences intermittent arthralgias and joint stiffness.  2 days ago she was experiencing pain and swelling in her right knee joint.  She has no warmth or effusion on exam today.  Overall she is clinically been doing well on Enbrel 50 mg sq injections every 7 days.  She has not missed any doses of Enbrel recently.  She does not want to make any changes at  this time.  She was advised to notify us if she develops increased joint pain or joint swelling.  She will follow-up in the office in 5 months.  High risk medication use - Enbrel SureClick 50 mg every 7 days. (discontinued SSZ in the past due to constipation).  CBC and CMP were within normal limits on 11/09/2019.  Standing orders for CBC and CMP are in place.  She is planning on having updated lab work at Dr. Alba Cory office in April and have the results faxed to our office.  TB gold was negative on 08/18/2019.  We will continue to monitor TB Gold on a yearly basis.  She received both COVID-19 vaccinations in January 2021.  She has not had any recent infections.  She is aware that she is to hold Enbrel if she develops any signs or symptoms of an infection and to resume once infection is completely cleared.  Primary osteoarthritis of both hands: She has no tenderness, thickening, or synovitis on exam.  She has complete fist formation bilaterally.  Joint protection and muscle strengthening were discussed.   History of right hip replacement: Doing well.  She has good ROM with no discomfort at this time.   DDD (degenerative disc disease), cervical: She has limited lateral rotation to the left.  No symptoms of radiculopathy at this time.   DDD (degenerative disc disease), thoracic: Chronic pain.  Midline spinal tenderness.   DDD  (degenerative disc disease), lumbar: Chronic pain.  She has good range of motion with midline spinal tenderness.  She has no symptoms of radiculopathy at this time.  She has been noticing some increased instability and lower extremity muscle weakness.  She declined a referral to physical therapy at this time.  We discussed the importance of lower extremity muscle strengthening and fall prevention.  Fibromyalgia - Celebrex 200 mg 1 capsule by mouth twice daily PRN for pain relief. trazodone 50 mg 1 tablet by mouth at bedtime as needed for insomnia  Age-related osteoporosis without current pathological fracture - DEXA scan on 05/06/17 showed T score of -2.4.  She has been treated with [place] and Prolia in the past.  She is due to update her bone density and will be discussing further with Dr. Glori Bickers at her upcoming office visit in April.  Other medical conditions are listed as follows:   Malignant neoplasm of upper-outer quadrant of left breast in female, estrogen receptor negative ([place])  History of migraine  History of anxiety  History of hearing loss  History of vitamin D deficiency  History of depression  History of hypothyroidism  Orders: No orders of the defined types were placed in this encounter.  No orders of the defined types were placed in this encounter.    Follow-Up Instructions: Return in about 5 months (around 06/03/2020) for Rheumatoid arthritis, Osteoarthritis.   Ofilia Neas, PA-C  Note - This record has been created using Dragon software.  Chart creation errors have been sought, but may not always  have been located. Such creation errors do not reflect on  the standard of medical care.

## 2020-01-02 ENCOUNTER — Other Ambulatory Visit: Payer: Self-pay

## 2020-01-02 ENCOUNTER — Ambulatory Visit: Payer: PPO | Admitting: Physician Assistant

## 2020-01-02 ENCOUNTER — Encounter: Payer: Self-pay | Admitting: Physician Assistant

## 2020-01-02 VITALS — BP 144/82 | HR 79 | Resp 16 | Ht 64.0 in | Wt 182.0 lb

## 2020-01-02 DIAGNOSIS — M5134 Other intervertebral disc degeneration, thoracic region: Secondary | ICD-10-CM | POA: Diagnosis not present

## 2020-01-02 DIAGNOSIS — Z8639 Personal history of other endocrine, nutritional and metabolic disease: Secondary | ICD-10-CM

## 2020-01-02 DIAGNOSIS — C50412 Malignant neoplasm of upper-outer quadrant of left female breast: Secondary | ICD-10-CM | POA: Diagnosis not present

## 2020-01-02 DIAGNOSIS — Z79899 Other long term (current) drug therapy: Secondary | ICD-10-CM | POA: Diagnosis not present

## 2020-01-02 DIAGNOSIS — M0579 Rheumatoid arthritis with rheumatoid factor of multiple sites without organ or systems involvement: Secondary | ICD-10-CM | POA: Diagnosis not present

## 2020-01-02 DIAGNOSIS — Z171 Estrogen receptor negative status [ER-]: Secondary | ICD-10-CM

## 2020-01-02 DIAGNOSIS — M19041 Primary osteoarthritis, right hand: Secondary | ICD-10-CM | POA: Diagnosis not present

## 2020-01-02 DIAGNOSIS — M797 Fibromyalgia: Secondary | ICD-10-CM

## 2020-01-02 DIAGNOSIS — Z96641 Presence of right artificial hip joint: Secondary | ICD-10-CM

## 2020-01-02 DIAGNOSIS — M5030 Other cervical disc degeneration, unspecified cervical region: Secondary | ICD-10-CM

## 2020-01-02 DIAGNOSIS — M5136 Other intervertebral disc degeneration, lumbar region: Secondary | ICD-10-CM | POA: Diagnosis not present

## 2020-01-02 DIAGNOSIS — M810 Age-related osteoporosis without current pathological fracture: Secondary | ICD-10-CM

## 2020-01-02 DIAGNOSIS — Z8659 Personal history of other mental and behavioral disorders: Secondary | ICD-10-CM | POA: Diagnosis not present

## 2020-01-02 DIAGNOSIS — M19042 Primary osteoarthritis, left hand: Secondary | ICD-10-CM

## 2020-01-02 DIAGNOSIS — Z8669 Personal history of other diseases of the nervous system and sense organs: Secondary | ICD-10-CM

## 2020-01-02 NOTE — Patient Instructions (Addendum)
Standing Labs We placed an order today for your standing lab work.    Please come back and get your standing labs in April and every 3 months   CBC and CMP   We have open lab daily Monday through Thursday from 8:30-12:30 PM and 1:30-4:30 PM and Friday from 8:30-12:30 PM and 1:30-4:00 PM at the office of Dr. Bo Merino.   You may experience shorter wait times on Monday and Friday afternoons. The office is located at [address] [place], [place], [place] [address] No appointment is necessary.   Labs are drawn by [facility].  You may receive a bill from [facility] for your lab work.  If you wish to have your labs drawn at another location, please call the office 24 hours in advance to send orders.  If you have any questions regarding directions or hours of operation,  please call [phone].   Just as a reminder please drink plenty of water prior to coming for your lab work. Thanks!

## 2020-02-14 ENCOUNTER — Ambulatory Visit (INDEPENDENT_AMBULATORY_CARE_PROVIDER_SITE_OTHER): Payer: PPO | Admitting: Family Medicine

## 2020-02-14 ENCOUNTER — Other Ambulatory Visit: Payer: Self-pay

## 2020-02-14 ENCOUNTER — Encounter: Payer: Self-pay | Admitting: Family Medicine

## 2020-02-14 VITALS — BP 138/80 | HR 81 | Temp 97.8°F | Wt 182.0 lb

## 2020-02-14 DIAGNOSIS — M0579 Rheumatoid arthritis with rheumatoid factor of multiple sites without organ or systems involvement: Secondary | ICD-10-CM | POA: Diagnosis not present

## 2020-02-14 DIAGNOSIS — M810 Age-related osteoporosis without current pathological fracture: Secondary | ICD-10-CM | POA: Diagnosis not present

## 2020-02-14 DIAGNOSIS — I700 Atherosclerosis of aorta: Secondary | ICD-10-CM | POA: Diagnosis not present

## 2020-02-14 DIAGNOSIS — E039 Hypothyroidism, unspecified: Secondary | ICD-10-CM | POA: Diagnosis not present

## 2020-02-14 DIAGNOSIS — R7309 Other abnormal glucose: Secondary | ICD-10-CM

## 2020-02-14 DIAGNOSIS — T451X5S Adverse effect of antineoplastic and immunosuppressive drugs, sequela: Secondary | ICD-10-CM

## 2020-02-14 DIAGNOSIS — E559 Vitamin D deficiency, unspecified: Secondary | ICD-10-CM | POA: Diagnosis not present

## 2020-02-14 DIAGNOSIS — M5135 Other intervertebral disc degeneration, thoracolumbar region: Secondary | ICD-10-CM

## 2020-02-14 LAB — COMPREHENSIVE METABOLIC PANEL
ALT: 14 U/L (ref 0–35)
AST: 16 U/L (ref 0–37)
Albumin: 4 g/dL (ref 3.5–5.2)
Alkaline Phosphatase: 100 U/L (ref 39–117)
BUN: 19 mg/dL (ref 6–23)
CO2: 27 mEq/L (ref 19–32)
Calcium: 9 mg/dL (ref 8.4–10.5)
Chloride: 103 mEq/L (ref 96–112)
Creatinine, Ser: 0.76 mg/dL (ref 0.40–1.20)
GFR: 74.75 mL/min (ref >60.00)
Glucose, Bld: 102 mg/dL — ABNORMAL HIGH (ref 70–99)
Potassium: 4.4 mEq/L (ref 3.5–5.1)
Sodium: 138 mEq/L (ref 135–145)
Total Bilirubin: 0.6 mg/dL (ref 0.2–1.2)
Total Protein: 7.2 g/dL (ref 6.0–8.3)

## 2020-02-14 LAB — HEMOGLOBIN A1C: Hgb A1c MFr Bld: 5.5 % (ref 4.6–6.5)

## 2020-02-14 LAB — VITAMIN D 25 HYDROXY (VIT D DEFICIENCY, FRACTURES): VITD: 46.95 ng/mL (ref 30.00–100.00)

## 2020-02-14 LAB — TSH: TSH: 1.75 u[IU]/mL (ref 0.35–4.50)

## 2020-02-14 MED ORDER — GABAPENTIN 100 MG PO CAPS: 100.0000 mg | ORAL_CAPSULE | Freq: Every day | ORAL | 3 refills | Status: DC

## 2020-02-14 NOTE — Assessment & Plan Note (Signed)
cmet and cbc today for enbrel

## 2020-02-14 NOTE — Progress Notes (Signed)
Subjective:    Patient ID: Samantha Valentine, female    DOB: 08-Aug-1946, 74 y.o.   MRN: [id number]  This visit occurred during the SARS-CoV-2 public health emergency.  Safety protocols were in place, including screening questions prior to the visit, additional usage of staff PPE, and extensive cleaning of exam room while observing appropriate contact time as indicated for disinfecting solutions.    HPI Pt presents for f/u of chronic health problems   Wt Readings from Last 3 Encounters:  02/14/20 182 lb (82.6 kg)  01/02/20 182 lb (82.6 kg)  05/10/19 181 lb 12.8 oz (82.5 kg)   31.24 kg/m   Had her covid vaccines   Has been ok overall - she deals with lack of sleep and arthritis pain (especially thoracic pain)  Difficult to exercise  Once she had rash after a pool-unsure if she could do water exercise  Very hard to move at times Wants to be outdoors    BP Readings from Last 3 Encounters:  02/14/20 (!) 142/84  01/02/20 (!) 144/82  05/10/19 133/79   Pulse Readings from Last 3 Encounters:  02/14/20 81  01/02/20 79  05/10/19 83      Hypothyroidism Lab Results  Component Value Date   TSH 1.24 01/04/2019   this is improved with 137 mcg daily   Osteoporosis  dexa 7/18 -unsure if she wants to do it  Vit D level 33 in jan of 2020 Last fx-ribs/foot in 2018  No falls in past 12 months  Takes 2000 iu three times per week , and 4000 four times a week  No medications- prolia caused joint pain  Intol of bisphosphonates  She took 2 years of forteo     Cbc and cmp are due for her enbrel/RA treatment   RA- dealing with that  Takes celebrex sparingly   H/o elevated glucose level in past  Breast cancer f/u  Mammogram is due in June (unsure if she wants to continue to monitor)  Did not finish chemo or take anti est tx  Does not see oncology    Last lipid check Lab Results  Component Value Date   CHOL 196 11/14/2018   HDL 68.80 11/14/2018   LDLCALC 112 (H)  11/14/2018   TRIG 76.0 11/14/2018   CHOLHDL 3 11/14/2018  diet controlled   Chronic thoracic back pain  DDD of back   Sleep disorder  Trazodone -dried mouth out  ? If gabapentin would help this and back pai n  Patient Active Problem List   Diagnosis Date Noted  . Elevated glucose level 11/23/2018  . Routine general medical examination at a health care facility 08/09/2018  . DDD (degenerative disc disease), thoracic 06/28/2018  . DDD (degenerative disc disease), cervical 06/28/2018  . Atherosclerosis of aorta ([place]) 03/11/2018  . Sleep disturbance 06/18/2017  . Tachycardia 05/25/2017  . Dysuria 05/25/2017  . Estrogen deficiency 03/22/2017  . Malignant neoplasm of upper-outer quadrant of left breast in female, estrogen receptor negative ([place]) 02/04/2017  . Tendinopathy of right shoulder 01/25/2017  . High risk medication use 09/29/2016  . DJD (degenerative joint disease), cervical 09/29/2016  . History of right hip replacement 09/29/2016  . Eczema 07/13/2014  . Adverse effect of immunosuppressive drug 04/27/2012  . ANXIETY DEPRESSION 10/28/2008  . Spinal stenosis 12/29/2007  . Hypothyroidism 12/28/2007  . Vitamin D deficiency 12/28/2007  . HEARING LOSS 12/28/2007  . Seropositive rheumatoid arthritis of multiple sites ([place]) 12/28/2007  . DDD (degenerative disc disease), lumbar 12/28/2007  .  Fibromyalgia 12/28/2007  . Osteoporosis 12/28/2007  . MIGRAINES, HX OF 12/28/2007   Past Medical History:  Diagnosis Date  . Anxiety   . Arthritis    RA  . Breast cancer ([place]) 01/29/2017  . Breast mass 1 year   . Cancer ([place]) 01/29/2017   left breast INVASIVE MAMMARY CARCINOMA, ER/PR positive  . Cataract 2019   resolved with surgery  . Collagen vascular disease (HCC)    Rhematoid Arthritis  . Complication of anesthesia   . DDD (degenerative disc disease)    in neck  . Depression   . Dyspnea   . Edema    FEET/LEGS  . Emphysema of lung ([place])   . Fibromyalgia   . GERD  (gastroesophageal reflux disease)    NO MEDS  . History of hiatal hernia   . History of kidney stones    MULTIPLE KIDNEY STONES BIL  . HOH (hard of hearing)    AIDS  . Hypothyroidism   . Macular degeneration, bilateral   . Migraine   . Osteoporosis   . Palpitations   . Personal history of chemotherapy    prior to mastectomy  . PONV (postoperative nausea and vomiting)    AFTER FIRST CATARACT  . Rheumatoid arthritis [facility])    Past Surgical History:  Procedure Laterality Date  . BREAST BIOPSY Left 01/29/2017   US guided biopsy INVASIVE MAMMARY CARCINOMA  . CATARACT EXTRACTION W/PHACO Left 04/28/2018   Procedure: CATARACT EXTRACTION PHACO AND INTRAOCULAR LENS PLACEMENT (IOC);  Surgeon: Leandrew Koyanagi, MD;  Location: [facility] ORS;  Service: Ophthalmology;  Laterality: Left;  Lot # [id number]   1:03 AP%   13.4 CDE    8.40  . CATARACT EXTRACTION W/PHACO Right 06/09/2018   Procedure: CATARACT EXTRACTION PHACO AND INTRAOCULAR LENS PLACEMENT (IOC);  Surgeon: Leandrew Koyanagi, MD;  Location: [facility] ORS;  Service: Ophthalmology;  Laterality: Right;  lot# [id number] [place] 0:55 ap 16.3% cde 8.21  . HIP ARTHROPLASTY    . JOINT REPLACEMENT Right 2003   total hip replacement  . LITHOTRIPSY  1997   kidney stone  . MASTECTOMY Left 01/29/2017  . MASTECTOMY W/ SENTINEL NODE BIOPSY Left 05/18/2017   Procedure: MASTECTOMY WITH SENTINEL LYMPH NODE BIOPSY;  Surgeon: Robert Bellow, MD;  Location: [facility] ORS;  Service: General;  Laterality: Left;  . PORT-A-CATH REMOVAL  09/2017  . PORTACATH PLACEMENT Right 02/15/2017   Procedure: INSERTION PORT-A-CATH;  Surgeon: Robert Bellow, MD;  Location: [facility] ORS;  Service: General;  Laterality: Right;  . TONSILLECTOMY  1970   Social History   Tobacco Use  . Smoking status: Former Smoker    Packs/day: 1.50    Years: 25.00    Pack years: 37.50    Types: Cigarettes    Quit date: 10/20/1983    Years since quitting: 36.3  . Smokeless tobacco: Never Used   Substance Use Topics  . Alcohol use: No  . Drug use: No   Family History  Problem Relation Age of Onset  . Alcohol abuse Father   . Diabetes Father   . Stroke Father   . Hypertension Mother   . Endometrial cancer Mother   . Osteoarthritis Mother   . Breast cancer Paternal Aunt 69  . Liver disease Sister   . Breast cancer Maternal Aunt   . Rheum arthritis Maternal Grandmother   . Diabetes Paternal Grandmother   . Parkinson's disease Paternal Grandfather    Allergies  Allergen Reactions  . Adhesive [Tape] Other (See Comments)  Skin blistered (PAPER TAPE OK)  . Cymbalta [Duloxetine Hcl]     Headache Inc bp  . Hydroxychloroquine Sulfate Rash  . Ibandronate Sodium Rash  . Risedronate Sodium Rash   Current Outpatient Medications on File Prior to Visit  Medication Sig Dispense Refill  . aspirin EC 81 MG tablet Take 81 mg by mouth daily.    . celecoxib (CELEBREX) 200 MG capsule 1 capsule by mouth twice a day when necessary with meals 120 capsule 0  . cholecalciferol (VITAMIN D) 1000 units tablet Take 1,000 Units by mouth daily.    Marland Kitchen etanercept (ENBREL SURECLICK) 50 MG/ML injection Inject 0.98 mLs (50 mg total) into the skin once a week. 12 pen 0  . levothyroxine (SYNTHROID) 137 MCG tablet TAKE 1 TABLET BY MOUTH ONCE DAILY BEFORE BREAKFAST 90 tablet 0  . LORazepam (ATIVAN) 1 MG tablet Take 1 tablet (1 mg total) by mouth at bedtime as needed for anxiety. 30 tablet 1   No current facility-administered medications on file prior to visit.    Review of Systems  Constitutional: Positive for fatigue. Negative for activity change, appetite change, fever and unexpected weight change.  HENT: Negative for congestion, ear pain, rhinorrhea, sinus pressure and sore throat.   Eyes: Negative for pain, redness and visual disturbance.  Respiratory: Negative for cough, shortness of breath and wheezing.   Cardiovascular: Negative for chest pain and palpitations.  Gastrointestinal: Negative for  abdominal pain, blood in stool, constipation and diarrhea.  Endocrine: Negative for polydipsia and polyuria.  Genitourinary: Negative for dysuria, frequency and urgency.  Musculoskeletal: Positive for arthralgias, back pain, gait problem and joint swelling. Negative for myalgias.  Skin: Negative for pallor and rash.  Allergic/Immunologic: Negative for environmental allergies.  Neurological: Negative for dizziness, syncope and headaches.  Hematological: Negative for adenopathy. Does not bruise/bleed easily.  Psychiatric/Behavioral: Negative for decreased concentration and dysphoric mood. The patient is not nervous/anxious.        Objective:   Physical Exam Constitutional:      General: She is not in acute distress.    Appearance: Normal appearance. She is well-developed. She is obese. She is not ill-appearing.  HENT:     Head: Normocephalic and atraumatic.     Mouth/Throat:     Mouth: Mucous membranes are moist.  Eyes:     General: No scleral icterus.    Conjunctiva/sclera: Conjunctivae normal.     Pupils: Pupils are equal, round, and reactive to light.  Neck:     Thyroid: No thyromegaly.     Vascular: No carotid bruit or JVD.     Comments: No thyroid enl or changes Cardiovascular:     Rate and Rhythm: Normal rate and regular rhythm.     Heart sounds: Normal heart sounds. No gallop.   Pulmonary:     Effort: Pulmonary effort is normal. No respiratory distress.     Breath sounds: Normal breath sounds. No wheezing or rales.  Abdominal:     General: Bowel sounds are normal. There is no distension or abdominal bruit.     Palpations: Abdomen is soft. There is no mass.     Tenderness: There is no abdominal tenderness.  Musculoskeletal:     Cervical back: Normal range of motion and neck supple.     Right lower leg: No edema.     Left lower leg: No edema.     Comments: No kyphosis  Swollen joints noted in hands/baseline  Lymphadenopathy:     Cervical: No cervical adenopathy.  Skin:    General: Skin is warm and dry.     Coloration: Skin is not pale.     Findings: No erythema or rash.     Comments: Fair complexion  Neurological:     Mental Status: She is alert.     Sensory: No sensory deficit.     Motor: No tremor.     Coordination: Coordination normal.     Deep Tendon Reflexes: Reflexes are normal and symmetric. Reflexes normal.  Psychiatric:        Mood and Affect: Mood normal.        Cognition and Memory: Cognition and memory normal.           Assessment & Plan:   Problem List Items Addressed This Visit      Cardiovascular and Mediastinum   Atherosclerosis of aorta (HCC)    No clinical changes  Watching blood pressure (better on 2nd check after sitting)        Endocrine   Hypothyroidism - Primary    TSH today  Has been stable with 137 mcg dose daily  Hypothyroidism  Pt has no clinical changes No change in energy level/ hair or skin/ edema and no tremor       Relevant Orders   TSH     Musculoskeletal and Integument   Seropositive rheumatoid arthritis of multiple sites ([place])    Continue rheum f/u  On enbrel  occ celebres  cmet and cbc today      Osteoporosis    Last dexa 2018  Allergic to bisphosphonates (rash)  Has completed 2 y of forteo in the past  prolia caused joint pain  No new fx (ribs and foot in the past)  No steroids currently  No falls  Difficult to exercise due to RA Taking ca and D  Sees rheumatology-will disc if ? Other potential tx  options  Holding off on further dexa due to ?how it may change management  Fall prev discussed      DDD (degenerative disc disease), thoracolumbar    More pain thoracic lately - suspect nerve related  Trial of gabapentin low dose May help re: comfort/sleep Will start with 100-200 mg at bedtime and update  Can titrate if needed Disc side eff in detail        Other   Vitamin D deficiency    D level today  Rev her current supplementation in setting of OP       Relevant Orders   VITAMIN D 25 Hydroxy (Vit-D Deficiency, Fractures)   Adverse effect of immunosuppressive drug    cmet and cbc today for enbrel      Relevant Orders   CBC with Differential/Platelet   Comprehensive metabolic panel   Elevated glucose level    102 fasting in past A1C today  disc imp of low glycemic diet and wt loss to prevent DM2       Relevant Orders   Hemoglobin A1c

## 2020-02-14 NOTE — Assessment & Plan Note (Signed)
No clinical changes  Watching blood pressure (better on 2nd check after sitting)

## 2020-02-14 NOTE — Assessment & Plan Note (Signed)
102 fasting in past A1C today  disc imp of low glycemic diet and wt loss to prevent DM2

## 2020-02-14 NOTE — Patient Instructions (Addendum)
Talk to your rheumatologist about osteoporosis   Labs today   Try 100-200 mg of gabapentin at night time for back pain  Let me know how it goes

## 2020-02-14 NOTE — Assessment & Plan Note (Signed)
D level today  Rev her current supplementation in setting of OP

## 2020-02-14 NOTE — Assessment & Plan Note (Signed)
TSH today  Has been stable with 137 mcg dose daily  Hypothyroidism  Pt has no clinical changes No change in energy level/ hair or skin/ edema and no tremor

## 2020-02-14 NOTE — Assessment & Plan Note (Signed)
Continue rheum f/u  On enbrel  occ celebres  cmet and cbc today

## 2020-02-14 NOTE — Assessment & Plan Note (Signed)
More pain thoracic lately - suspect nerve related  Trial of gabapentin low dose May help re: comfort/sleep Will start with 100-200 mg at bedtime and update  Can titrate if needed Disc side eff in detail

## 2020-02-14 NOTE — Assessment & Plan Note (Signed)
Last dexa 2018  Allergic to bisphosphonates (rash)  Has completed 2 y of forteo in the past  prolia caused joint pain  No new fx (ribs and foot in the past)  No steroids currently  No falls  Difficult to exercise due to RA Taking ca and D  Sees rheumatology-will disc if ? Other potential tx  options  Holding off on further dexa due to ?how it may change management  Fall prev discussed

## 2020-02-15 ENCOUNTER — Other Ambulatory Visit: Payer: Self-pay | Admitting: Family Medicine

## 2020-02-15 ENCOUNTER — Other Ambulatory Visit: Payer: Self-pay | Admitting: Rheumatology

## 2020-02-15 LAB — CBC WITH DIFFERENTIAL/PLATELET
Basophils Absolute: 0.1 10*3/uL (ref 0.0–0.1)
Basophils Relative: 1.4 % (ref 0.0–3.0)
Eosinophils Absolute: 0.2 10*3/uL (ref 0.0–0.7)
Eosinophils Relative: 3.1 % (ref 0.0–5.0)
HCT: 45.1 % (ref 36.0–46.0)
Hemoglobin: 14.6 g/dL (ref 12.0–15.0)
Lymphocytes Relative: 27.2 % (ref 12.0–46.0)
Lymphs Abs: 1.7 10*3/uL (ref 0.7–4.0)
MCHC: 32.4 g/dL (ref 30.0–36.0)
MCV: 92.8 fl (ref 78.0–100.0)
Monocytes Absolute: 0.6 10*3/uL (ref 0.1–1.0)
Monocytes Relative: 9.4 % (ref 3.0–12.0)
Neutro Abs: 3.8 10*3/uL (ref 1.4–7.7)
Neutrophils Relative %: 58.9 % (ref 43.0–77.0)
Platelets: 338 10*3/uL (ref 150.0–400.0)
RBC: 4.86 Mil/uL (ref 3.87–5.11)
RDW: 14.7 % (ref 11.5–15.5)
WBC: 6.4 10*3/uL (ref 4.0–10.5)

## 2020-02-15 MED ORDER — ENBREL SURECLICK 50 MG/ML ~~LOC~~ SOAJ: 50.0000 mg | SUBCUTANEOUS | 0 refills | Status: DC

## 2020-02-15 NOTE — Telephone Encounter (Signed)
Last Visit: 01/02/2020 Next Visit: 06/04/2020 Labs: 02/14/2020 glucose 102 TB Gold: 08/18/2019 negative   Okay to refill per Dr. Estanislado Pandy.

## 2020-02-16 MED ORDER — LEVOTHYROXINE SODIUM 137 MCG PO TABS: 137.0000 ug | ORAL_TABLET | Freq: Every day | ORAL | 2 refills | Status: DC

## 2020-05-14 ENCOUNTER — Other Ambulatory Visit: Payer: Self-pay

## 2020-05-14 DIAGNOSIS — Z79899 Other long term (current) drug therapy: Secondary | ICD-10-CM | POA: Diagnosis not present

## 2020-05-15 ENCOUNTER — Other Ambulatory Visit: Payer: Self-pay | Admitting: Rheumatology

## 2020-05-15 LAB — CBC WITH DIFFERENTIAL/PLATELET
Absolute Monocytes: 650 cells/uL (ref 200–950)
Basophils Absolute: 47 cells/uL (ref 0–200)
Basophils Relative: 0.7 %
Eosinophils Absolute: 188 cells/uL (ref 15–500)
Eosinophils Relative: 2.8 %
HCT: 42.3 % (ref 35.0–45.0)
Hemoglobin: 13.8 g/dL (ref 11.7–15.5)
Lymphs Abs: 2218 cells/uL (ref 850–3900)
MCH: 30 pg (ref 27.0–33.0)
MCHC: 32.6 g/dL (ref 32.0–36.0)
MCV: 92 fL (ref 80.0–100.0)
MPV: 11.3 fL (ref 7.5–12.5)
Monocytes Relative: 9.7 %
Neutro Abs: 3598 cells/uL (ref 1500–7800)
Neutrophils Relative %: 53.7 %
Platelets: 306 10*3/uL (ref 140–400)
RBC: 4.6 10*6/uL (ref 3.80–5.10)
RDW: 13.3 % (ref 11.0–15.0)
Total Lymphocyte: 33.1 %
WBC: 6.7 10*3/uL (ref 3.8–10.8)

## 2020-05-15 LAB — COMPLETE METABOLIC PANEL WITH GFR
AG Ratio: 1.4 (calc) (ref 1.0–2.5)
ALT: 11 U/L (ref 6–29)
AST: 15 U/L (ref 10–35)
Albumin: 3.6 g/dL (ref 3.6–5.1)
Alkaline phosphatase (APISO): 88 U/L (ref 37–153)
BUN: 14 mg/dL (ref 7–25)
CO2: 24 mmol/L (ref 20–32)
Calcium: 9.2 mg/dL (ref 8.6–10.4)
Chloride: 106 mmol/L (ref 98–110)
Creat: 0.81 mg/dL (ref 0.60–0.93)
GFR, Est African American: 84 mL/min/{1.73_m2} (ref >=60)
GFR, Est Non African American: 73 mL/min/{1.73_m2} (ref >=60)
Globulin: 2.5 g/dL (calc) (ref 1.9–3.7)
Glucose, Bld: 91 mg/dL (ref 65–99)
Potassium: 4.9 mmol/L (ref 3.5–5.3)
Sodium: 140 mmol/L (ref 135–146)
Total Bilirubin: 0.4 mg/dL (ref 0.2–1.2)
Total Protein: 6.1 g/dL (ref 6.1–8.1)

## 2020-05-15 MED ORDER — ENBREL SURECLICK 50 MG/ML ~~LOC~~ SOAJ: 50.0000 mg | SUBCUTANEOUS | 0 refills | Status: DC

## 2020-05-15 NOTE — Progress Notes (Signed)
CBC and CMP WNL

## 2020-05-15 NOTE — Telephone Encounter (Signed)
Last Visit: 01/02/2020 Next Visit: 06/04/2020 Labs: 05/14/2020 CBC and CMP WNL. TB Gold: 08/18/2019 negative   Current Dose per office note on 6/58/0063: Enbrel SureClick 50 mg every 7 days DX: Seropositive rheumatoid arthritis of multiple sites  Okay to refill per Dr. Estanislado Pandy

## 2020-05-23 NOTE — Progress Notes (Signed)
Office Visit Note  Patient: Samantha Valentine             Date of Birth: 1945-12-24           MRN: [id number]             PCP: Abner Greenspan, MD Referring: Tower, Wynelle Fanny, MD Visit Date: 06/04/2020 Occupation: @GUAROCC @  Subjective:  Left hip pain   History of Present Illness: Samantha Valentine is a 74 y.o. female with history of seropositive rheumatoid arthritis, fibromyalgia, osteoarthritis, and osteoporosis. She is on Enbrel 50 mg sq injections once weekly.  She has been experiencing increased discomfort in the left hip joint.  She denies any joint swelling at this time.  She states she has been more sedentary for the past 1.5 years due to the covid-19 pandemic.  She has received both covid-19 vaccination.    Activities of Daily Living:  Patient reports joint stiffness all day Patient Reports nocturnal pain.  Difficulty dressing/grooming: Denies Difficulty climbing stairs: Reports Difficulty getting out of chair: Reports Difficulty using hands for taps, buttons, cutlery, and/or writing: Denies  Review of Systems  Constitutional: Positive for fatigue.  HENT: Positive for mouth dryness. Negative for mouth sores and nose dryness.   Eyes: Positive for dryness. Negative for pain and visual disturbance.  Respiratory: Negative for cough, hemoptysis, shortness of breath and difficulty breathing.   Cardiovascular: Negative for chest pain, palpitations, hypertension and swelling in legs/feet.  Gastrointestinal: Negative for blood in stool, constipation and diarrhea.  Endocrine: Negative for increased urination.  Genitourinary: Negative for painful urination.  Musculoskeletal: Positive for arthralgias, joint pain and morning stiffness. Negative for joint swelling, myalgias, muscle weakness, muscle tenderness and myalgias.  Skin: Negative for color change, pallor, rash, hair loss, nodules/bumps, skin tightness, ulcers and sensitivity to sunlight.  Allergic/Immunologic: Negative for  susceptible to infections.  Neurological: Negative for dizziness, numbness, headaches and weakness.  Hematological: Negative for swollen glands.  Psychiatric/Behavioral: Positive for sleep disturbance. Negative for depressed mood. The patient is not nervous/anxious.     PMFS History:  Patient Active Problem List   Diagnosis Date Noted  . Elevated glucose level 11/23/2018  . Routine general medical examination at a health care facility 08/09/2018  . DDD (degenerative disc disease), thoracolumbar 06/28/2018  . DDD (degenerative disc disease), cervical 06/28/2018  . Atherosclerosis of aorta ([place]) 03/11/2018  . Sleep disturbance 06/18/2017  . Tachycardia 05/25/2017  . Dysuria 05/25/2017  . Estrogen deficiency 03/22/2017  . Malignant neoplasm of upper-outer quadrant of left breast in female, estrogen receptor negative ([place]) 02/04/2017  . Tendinopathy of right shoulder 01/25/2017  . High risk medication use 09/29/2016  . DJD (degenerative joint disease), cervical 09/29/2016  . History of right hip replacement 09/29/2016  . Eczema 07/13/2014  . Adverse effect of immunosuppressive drug 04/27/2012  . ANXIETY DEPRESSION 10/28/2008  . Spinal stenosis 12/29/2007  . Hypothyroidism 12/28/2007  . Vitamin D deficiency 12/28/2007  . HEARING LOSS 12/28/2007  . Seropositive rheumatoid arthritis of multiple sites ([place]) 12/28/2007  . DDD (degenerative disc disease), lumbar 12/28/2007  . Fibromyalgia 12/28/2007  . Osteoporosis 12/28/2007  . MIGRAINES, HX OF 12/28/2007    Past Medical History:  Diagnosis Date  . Anxiety   . Arthritis    RA  . Breast cancer ([place]) 01/29/2017  . Breast mass 1 year   . Cancer ([place]) 01/29/2017   left breast INVASIVE MAMMARY CARCINOMA, ER/PR positive  . Cataract 2019   resolved with surgery  . Collagen  vascular disease ([place])    Rhematoid Arthritis  . Complication of anesthesia   . DDD (degenerative disc disease)    in neck  . Depression   . Dyspnea   . Edema     FEET/LEGS  . Emphysema of lung ([place])   . Fibromyalgia   . GERD (gastroesophageal reflux disease)    NO MEDS  . History of hiatal hernia   . History of kidney stones    MULTIPLE KIDNEY STONES BIL  . HOH (hard of hearing)    AIDS  . Hypothyroidism   . Macular degeneration, bilateral   . Migraine   . Osteoporosis   . Palpitations   . Personal history of chemotherapy    prior to mastectomy  . PONV (postoperative nausea and vomiting)    AFTER FIRST CATARACT  . Rheumatoid arthritis ([place])     Family History  Problem Relation Age of Onset  . Alcohol abuse Father   . Diabetes Father   . Stroke Father   . Hypertension Mother   . Endometrial cancer Mother   . Osteoarthritis Mother   . Breast cancer Paternal Aunt 28  . Liver disease Sister   . Breast cancer Maternal Aunt   . Rheum arthritis Maternal Grandmother   . Diabetes Paternal Grandmother   . Parkinson's disease Paternal Grandfather    Past Surgical History:  Procedure Laterality Date  . BREAST BIOPSY Left 01/29/2017   US guided biopsy INVASIVE MAMMARY CARCINOMA  . CATARACT EXTRACTION W/PHACO Left 04/28/2018   Procedure: CATARACT EXTRACTION PHACO AND INTRAOCULAR LENS PLACEMENT (IOC);  Surgeon: Leandrew Koyanagi, MD;  Location: [facility] ORS;  Service: Ophthalmology;  Laterality: Left;  Lot # [id number]   1:03 AP%   13.4 CDE    8.40  . CATARACT EXTRACTION W/PHACO Right 06/09/2018   Procedure: CATARACT EXTRACTION PHACO AND INTRAOCULAR LENS PLACEMENT (IOC);  Surgeon: Leandrew Koyanagi, MD;  Location: [facility] ORS;  Service: Ophthalmology;  Laterality: Right;  lot# [id number] [place] 0:55 ap 16.3% cde 8.21  . HIP ARTHROPLASTY    . JOINT REPLACEMENT Right 2003   total hip replacement  . LITHOTRIPSY  1997   kidney stone  . MASTECTOMY Left 01/29/2017  . MASTECTOMY W/ SENTINEL NODE BIOPSY Left 05/18/2017   Procedure: MASTECTOMY WITH SENTINEL LYMPH NODE BIOPSY;  Surgeon: Robert Bellow, MD;  Location: [facility] ORS;  Service:  General;  Laterality: Left;  . PORT-A-CATH REMOVAL  09/2017  . PORTACATH PLACEMENT Right 02/15/2017   Procedure: INSERTION PORT-A-CATH;  Surgeon: Robert Bellow, MD;  Location: [facility] ORS;  Service: General;  Laterality: Right;  . TONSILLECTOMY  1970   Social History   Social History Narrative  . Not on file   Immunization History  Administered Date(s) Administered  . Influenza, High Dose Seasonal PF 07/28/2017, 07/17/2018  . Influenza,inj,Quad PF,6+ Mos 07/05/2013, 07/13/2014, 08/20/2015  . Influenza-Unspecified 07/19/2016, 07/28/2017, 07/17/2018  . PFIZER SARS-COV-2 Vaccination 10/30/2019, 11/18/2019  . Pneumococcal Conjugate-13 08/20/2015  . Pneumococcal Polysaccharide-23 11/03/2006, 07/05/2013  . Td 05/03/2002     Objective: Vital Signs: BP (!) 152/89 (BP Location: Right Arm, Patient Position: Sitting, Cuff Size: Small)   Pulse 88   Resp 12   Ht 5\' 4"  (1.626 m)   Wt 183 lb (83 kg)   BMI 31.41 kg/m    Physical Exam Vitals and nursing note reviewed.  Constitutional:      Appearance: She is well-developed.  HENT:     Head: Normocephalic and atraumatic.  Eyes:     Conjunctiva/sclera:  Conjunctivae normal.  Pulmonary:     Effort: Pulmonary effort is normal.  Abdominal:     Palpations: Abdomen is soft.  Musculoskeletal:     Cervical back: Normal range of motion.  Lymphadenopathy:     Cervical: No cervical adenopathy.  Skin:    General: Skin is warm and dry.     Capillary Refill: Capillary refill takes less than 2 seconds.  Neurological:     Mental Status: She is alert and oriented to person, place, and time.  Psychiatric:        Behavior: Behavior normal.      Musculoskeletal Exam: C-spine, thoracic spine, and lumbar spine good ROM.  Shoulder joints, elbow joints, wrist joints good ROM with no tenderness or synovitis. Bilateral 2nd MCP joint synovial thickening.  PIP and DIP thickening consistent with osteoarthritis of both hands. dupuytren's contracture of 4th  finger bilaterally. Right hip replacement good ROM.  Left hip good ROM with discomfort. Knee joints good ROM with no warmth or effusion.  No tenderness or inflammation of ankle joints.     CDAI Exam: CDAI Score: -- Patient Global: --; Provider Global: -- Swollen: --; Tender: -- Joint Exam 06/04/2020   No joint exam has been documented for this visit   There is currently no information documented on the homunculus. Go to the Rheumatology activity and complete the homunculus joint exam.  Investigation: No additional findings.  Imaging: No results found.  Recent Labs: Lab Results  Component Value Date   WBC 6.7 05/14/2020   HGB 13.8 05/14/2020   PLT 306 05/14/2020   NA 140 05/14/2020   K 4.9 05/14/2020   CL 106 05/14/2020   CO2 24 05/14/2020   GLUCOSE 91 05/14/2020   BUN 14 05/14/2020   CREATININE 0.81 05/14/2020   BILITOT 0.4 05/14/2020   ALKPHOS 100 02/14/2020   AST 15 05/14/2020   ALT 11 05/14/2020   PROT 6.1 05/14/2020   ALBUMIN 4.0 02/14/2020   CALCIUM 9.2 05/14/2020   GFRAA 84 05/14/2020   QFTBGOLD NEGATIVE 09/03/2017   QFTBGOLDPLUS NEGATIVE 08/18/2019    Speciality Comments: No specialty comments available.  Procedures:  No procedures performed Allergies: Adhesive [tape], Cymbalta [duloxetine hcl], Hydroxychloroquine sulfate, Ibandronate sodium, and Risedronate sodium   Assessment / Plan:     Visit Diagnoses: Seropositive rheumatoid arthritis of multiple sites ([place]) - Positive RF with erosive disease: She has no tenderness or synovitis on exam.  She has not had any recent rheumatoid arthritis flares.  She is clinically doing well on Enbrel 50 mg subcutaneous injections once weekly.  She has not missed any doses of Enbrel recently.  She experiences joint stiffness lasting all day but has not noticed any joint inflammation.  X-rays of both hands and feet were obtained today to assess for radiographic progression.  She will continue on Enbrel 50 mg subcu injections  once weekly.  She was advised to notify us if she develops increased joint pain or joint swelling.  She will follow-up in the office in 5 months.- Plan: XR Hand 2 View Right, XR Hand 2 View Left, XR Foot 2 Views Right, XR Foot 2 Views Left, XR HIP UNILAT W OR W/O PELVIS 2-3 VIEWS LEFT.  X-ray of bilateral hands consistent with rheumatoid arthritis and osteoarthritis overlap.  X-ray of bilateral feet showed erosive changes consistent with rheumatoid arthritis.  Left hip joint x-ray was unremarkable.  Some SI joint changes were noted.  High risk medication use - Enbrel SureClick 50 mg every 7 days. (  discontinued SSZ in the past due to constipation).  CBC and CMP were within normal limits on 05/14/2020.  She will be due to update lab work in October and every 3 months to monitor for drug toxicity.  Standing orders for CBC and CMP are in place.  TB gold negative on 08/18/2019.  A future order for TB gold was placed today.- Plan: QuantiFERON-TB Gold Plus She has received both COVID-19 vaccinations.  She is planning on receiving the booster vaccine.  She was encouraged to continue to wear a mask and social distance.  She was advised to notify us or her PCP if she develops the COVID-19 infection in order to receive the antibody infusion.   Screening for tuberculosis -Future order for TB gold was placed today.  TB gold was negative on 08/18/2019.  Plan: QuantiFERON-TB Gold Plus  Primary osteoarthritis of both hands -She has PIP and DIP thickening consistent with osteoarthritis of both hands.  She is not having any discomfort, tenderness, or inflammation at this time.  She has complete fist formation bilaterally.  X-rays of both hands were updated today.  Plan: XR Hand 2 View Right, XR Hand 2 View Left  History of right hip replacement: Doing well.  She has good ROM with no discomfort at this time.   Pain in left hip - She has been experiencing increased discomfort in the left hip joint radiating to her groin.  She  has also had some discomfort over the left trochanteric bursa.  X-rays of the left hip were obtained today.  Plan: XR HIP UNILAT W OR W/O PELVIS 2-3 VIEWS LEFT  DDD (degenerative disc disease), cervical: She has good ROM with no discomfort.  No symptoms of radiculopathy.   DDD (degenerative disc disease), thoracic:  Postural thoracic kyphosis noted.   DDD (degenerative disc disease), lumbar: She is not having any increased lower back pain at this time.  No symptoms of radiculopathy.   Fibromyalgia -She is not experiencing any myalgias or muscle tenderness due to underlying fibromyalgia at this time.  She has not had any recent flares.  She experiences intermittent fatigue and was encouraged to try to stay active.  She tried taking trazodone but discontinued due to significant dry mouth.  She takes gabapentin 100 mg 1 to 2 capsules by mouth at bedtime and Celebrex 200 mg 1 capsule by mouth twice daily PRN for pain relief.   Age-related osteoporosis without current pathological fracture - DEXA scan on 05/06/17 showed T score of -2.4.  She has been treated with Forteo and Prolia in the past.  She developed a rash while on risedronate sodium.  She is taking a calcium and vitamin D supplement.   Other medical conditions are listed as follows:   Malignant neoplasm of upper-outer quadrant of left breast in female, estrogen receptor negative ([place])  History of migraine  History of depression  History of hearing loss  History of anxiety  History of vitamin D deficiency  History of hypothyroidism    Orders: Orders Placed This Encounter  Procedures  . XR Hand 2 View Right  . XR Hand 2 View Left  . XR Foot 2 Views Right  . XR Foot 2 Views Left  . XR HIP UNILAT W OR W/O PELVIS 2-3 VIEWS LEFT  . QuantiFERON-TB Gold Plus   No orders of the defined types were placed in this encounter.     Follow-Up Instructions: Return in about 5 months (around 11/04/2020) for Rheumatoid arthritis,  Osteoarthritis, Fibromyalgia.  Ofilia Neas, PA-C  Note - This record has been created using Dragon software.  Chart creation errors have been sought, but may not always  have been located. Such creation errors do not reflect on  the standard of medical care.

## 2020-06-04 ENCOUNTER — Encounter: Payer: Self-pay | Admitting: Physician Assistant

## 2020-06-04 ENCOUNTER — Ambulatory Visit: Payer: Self-pay

## 2020-06-04 ENCOUNTER — Ambulatory Visit: Payer: PPO | Admitting: Physician Assistant

## 2020-06-04 ENCOUNTER — Other Ambulatory Visit: Payer: Self-pay

## 2020-06-04 VITALS — BP 152/89 | HR 88 | Resp 12 | Ht 64.0 in | Wt 183.0 lb

## 2020-06-04 DIAGNOSIS — M25552 Pain in left hip: Secondary | ICD-10-CM | POA: Diagnosis not present

## 2020-06-04 DIAGNOSIS — M810 Age-related osteoporosis without current pathological fracture: Secondary | ICD-10-CM | POA: Diagnosis not present

## 2020-06-04 DIAGNOSIS — M19042 Primary osteoarthritis, left hand: Secondary | ICD-10-CM | POA: Diagnosis not present

## 2020-06-04 DIAGNOSIS — M5030 Other cervical disc degeneration, unspecified cervical region: Secondary | ICD-10-CM | POA: Diagnosis not present

## 2020-06-04 DIAGNOSIS — M797 Fibromyalgia: Secondary | ICD-10-CM

## 2020-06-04 DIAGNOSIS — Z8659 Personal history of other mental and behavioral disorders: Secondary | ICD-10-CM

## 2020-06-04 DIAGNOSIS — Z8669 Personal history of other diseases of the nervous system and sense organs: Secondary | ICD-10-CM | POA: Diagnosis not present

## 2020-06-04 DIAGNOSIS — Z79899 Other long term (current) drug therapy: Secondary | ICD-10-CM

## 2020-06-04 DIAGNOSIS — M0579 Rheumatoid arthritis with rheumatoid factor of multiple sites without organ or systems involvement: Secondary | ICD-10-CM

## 2020-06-04 DIAGNOSIS — M5134 Other intervertebral disc degeneration, thoracic region: Secondary | ICD-10-CM

## 2020-06-04 DIAGNOSIS — Z96641 Presence of right artificial hip joint: Secondary | ICD-10-CM

## 2020-06-04 DIAGNOSIS — Z171 Estrogen receptor negative status [ER-]: Secondary | ICD-10-CM

## 2020-06-04 DIAGNOSIS — M19041 Primary osteoarthritis, right hand: Secondary | ICD-10-CM

## 2020-06-04 DIAGNOSIS — Z8639 Personal history of other endocrine, nutritional and metabolic disease: Secondary | ICD-10-CM

## 2020-06-04 DIAGNOSIS — C50412 Malignant neoplasm of upper-outer quadrant of left female breast: Secondary | ICD-10-CM | POA: Diagnosis not present

## 2020-06-04 DIAGNOSIS — Z111 Encounter for screening for respiratory tuberculosis: Secondary | ICD-10-CM

## 2020-06-04 DIAGNOSIS — M5136 Other intervertebral disc degeneration, lumbar region: Secondary | ICD-10-CM

## 2020-06-04 DIAGNOSIS — M51369 Other intervertebral disc degeneration, lumbar region without mention of lumbar back pain or lower extremity pain: Secondary | ICD-10-CM

## 2020-06-04 NOTE — Patient Instructions (Addendum)
Standing Labs We placed an order today for your standing lab work.   Please have your standing labs drawn in October and every 3 months   If possible, please have your labs drawn 2 weeks prior to your appointment so that the provider can discuss your results at your appointment.  We have open lab daily Monday through Thursday from 8:30-12:30 PM and 1:30-4:30 PM and Friday from 8:30-12:30 PM and 1:30-4:00 PM at the office of Dr. Bo Merino, [facility].   Please be advised, patients with office appointments requiring lab work will take precedents over walk-in lab work.  If possible, please come for your lab work on Monday and Friday afternoons, as you may experience shorter wait times. The office is located at [address], [place], [place], [place] [address] No appointment is necessary.   Labs are drawn by [facility]. Please bring your co-pay at the time of your lab draw.  You may receive a bill from [facility] for your lab work.  If you wish to have your labs drawn at another location, please call the office 24 hours in advance to send orders.  If you have any questions regarding directions or hours of operation,  please call [phone].   As a reminder, please drink plenty of water prior to coming for your lab work. Thanks!  COVID-19 vaccine recommendations:   COVID-19 vaccine is recommended for everyone (unless you are allergic to a vaccine component), even if you are on a medication that suppresses your immune system.   If you are on Methotrexate, Cellcept (mycophenolate), Rinvoq, Morrie Sheldon, and Olumiant- hold the medication for 1 week after each vaccine. Hold Methotrexate for 2 weeks after the single dose COVID-19 vaccine.   If you are on Orencia subcutaneous injection - hold medication one week prior to and one week after the first COVID-19 vaccine dose (only).   If you are on Orencia IV infusions- time vaccination administration so that the first COVID-19 vaccination  will occur four weeks after the infusion and postpone the subsequent infusion by one week.   If you are on Cyclophosphamide or Rituxan infusions please contact your doctor prior to receiving the COVID-19 vaccine.   Do not take Tylenol or ant anti-inflammatory medications (NSAIDs) 24 hours prior to the COVID-19 vaccination.   There is no direct evidence about the efficacy of the COVID-19 vaccine in individuals who are on medications that suppress the immune system.   Even if you are fully vaccinated, and you are on any medications that suppress your immune system, please continue to wear a mask, maintain at least six feet social distance and practice hand hygiene.   If you develop a COVID-19 infection, please contact your PCP or our office to determine if you need antibody infusion.  The booster vaccine is now available for immunocompromised patients. It is advised that if you had Pfizer vaccine you should get Coca-Cola booster.  If you had a Moderna vaccine then you should get a Moderna booster. Johnson and Wynetta Emery does not have a booster vaccine at this time.  Please see the following web sites for updated information.   https://www.rheumatology.org/Portals/0/Files/COVID-19-Vaccination-Patient-Resources.pdf  https://www.rheumatology.org/About-Us/Newsroom/Press-Releases/ID/1159   Hip Exercises Ask your health care provider which exercises are safe for you. Do exercises exactly as told by your health care provider and adjust them as directed. It is normal to feel mild stretching, pulling, tightness, or discomfort as you do these exercises. Stop right away if you feel sudden pain or your pain gets worse. Do  not begin these exercises until told by your health care provider. Stretching and range-of-motion exercises These exercises warm up your muscles and joints and improve the movement and flexibility of your hip. These exercises also help to relieve pain, numbness, and tingling. You may be asked  to limit your range of motion if you had a hip replacement. Talk to your health care provider about these restrictions. Hamstrings, supine  1. Lie on your back (supine position). 2. Loop a belt or towel over the ball of your left / right foot. The ball of your foot is on the walking surface, right under your toes. 3. Straighten your left / right knee and slowly pull on the belt or towel to raise your leg until you feel a gentle stretch behind your knee (hamstring). ? Do not let your knee bend while you do this. ? Keep your other leg flat on the floor. 4. Hold this position for __________ seconds. 5. Slowly return your leg to the starting position. Repeat __________ times. Complete this exercise __________ times a day. Hip rotation  1. Lie on your back on a firm surface. 2. With your left / right hand, gently pull your left / right knee toward the shoulder that is on the same side of the body. Stop when your knee is pointing toward the ceiling. 3. Hold your left / right ankle with your other hand. 4. Keeping your knee steady, gently pull your left / right ankle toward your other shoulder until you feel a stretch in your buttocks. ? Keep your hips and shoulders firmly planted while you do this stretch. 5. Hold this position for __________ seconds. Repeat __________ times. Complete this exercise __________ times a day. Seated stretch This exercise is sometimes called hamstrings and adductors stretch. 1. Sit on the floor with your legs stretched wide. Keep your knees straight during this exercise. 2. Keeping your head and back in a straight line, bend at your waist to reach for your left foot (position A). You should feel a stretch in your right inner thigh (adductors). 3. Hold this position for __________ seconds. Then slowly return to the upright position. 4. Keeping your head and back in a straight line, bend at your waist to reach forward (position B). You should feel a stretch behind both  of your thighs and knees (hamstrings). 5. Hold this position for __________ seconds. Then slowly return to the upright position. 6. Keeping your head and back in a straight line, bend at your waist to reach for your right foot (position C). You should feel a stretch in your left inner thigh (adductors). 7. Hold this position for __________ seconds. Then slowly return to the upright position. Repeat __________ times. Complete this exercise __________ times a day. Lunge This exercise stretches the muscles of the hip (hip flexors). 1. Place your left / right knee on the floor and bend your other knee so that is directly over your ankle. You should be half-kneeling. 2. Keep good posture with your head over your shoulders. 3. Tighten your buttocks to point your tailbone downward. This will prevent your back from arching too much. 4. You should feel a gentle stretch in the front of your left / right thigh and hip. If you do not feel a stretch, slide your other foot forward slightly and then slowly lunge forward with your chest up until your knee once again lines up over your ankle. ? Make sure your tailbone continues to point downward. 5. Hold this  position for __________ seconds. 6. Slowly return to the starting position. Repeat __________ times. Complete this exercise __________ times a day. Strengthening exercises These exercises build strength and endurance in your hip. Endurance is the ability to use your muscles for a long time, even after they get tired. Bridge This exercise strengthens the muscles of your hip (hip extensors). 1. Lie on your back on a firm surface with your knees bent and your feet flat on the floor. 2. Tighten your buttocks muscles and lift your bottom off the floor until the trunk of your body and your hips are level with your thighs. ? Do not arch your back. ? You should feel the muscles working in your buttocks and the back of your thighs. If you do not feel these muscles,  slide your feet 1-2 inches (2.5-5 cm) farther away from your buttocks. 3. Hold this position for __________ seconds. 4. Slowly lower your hips to the starting position. 5. Let your muscles relax completely between repetitions. Repeat __________ times. Complete this exercise __________ times a day. Straight leg raises, side-lying This exercise strengthens the muscles that move the hip joint away from the center of the body (hip abductors). 1. Lie on your side with your left / right leg in the top position. Lie so your head, shoulder, hip, and knee line up. You may bend your bottom knee slightly to help you balance. 2. Roll your hips slightly forward, so your hips are stacked directly over each other and your left / right knee is facing forward. 3. Leading with your heel, lift your top leg 4-6 inches (10-15 cm). You should feel the muscles in your top hip lifting. ? Do not let your foot drift forward. ? Do not let your knee roll toward the ceiling. 4. Hold this position for __________ seconds. 5. Slowly return to the starting position. 6. Let your muscles relax completely between repetitions. Repeat __________ times. Complete this exercise __________ times a day. Straight leg raises, side-lying This exercise strengthens the muscles that move the hip joint toward the center of the body (hip adductors). 1. Lie on your side with your left / right leg in the bottom position. Lie so your head, shoulder, hip, and knee line up. You may place your upper foot in front to help you balance. 2. Roll your hips slightly forward, so your hips are stacked directly over each other and your left / right knee is facing forward. 3. Tense the muscles in your inner thigh and lift your bottom leg 4-6 inches (10-15 cm). 4. Hold this position for __________ seconds. 5. Slowly return to the starting position. 6. Let your muscles relax completely between repetitions. Repeat __________ times. Complete this exercise  __________ times a day. Straight leg raises, supine This exercise strengthens the muscles in the front of your thigh (quadriceps). 1. Lie on your back (supine position) with your left / right leg extended and your other knee bent. 2. Tense the muscles in the front of your left / right thigh. You should see your kneecap slide up or see increased dimpling just above your knee. 3. Keep these muscles tight as you raise your leg 4-6 inches (10-15 cm) off the floor. Do not let your knee bend. 4. Hold this position for __________ seconds. 5. Keep these muscles tense as you lower your leg. 6. Relax the muscles slowly and completely between repetitions. Repeat __________ times. Complete this exercise __________ times a day. Hip abductors, standing This exercise strengthens the  muscles that move the leg and hip joint away from the center of the body (hip abductors). 1. Tie one end of a rubber exercise band or tubing to a secure surface, such as a chair, table, or pole. 2. Loop the other end of the band or tubing around your left / right ankle. 3. Keeping your ankle with the band or tubing directly opposite the secured end, step away until there is tension in the tubing or band. Hold on to a chair, table, or pole as needed for balance. 4. Lift your left / right leg out to your side. While you do this: ? Keep your back upright. ? Keep your shoulders over your hips. ? Keep your toes pointing forward. ? Make sure to use your hip muscles to slowly lift your leg. Do not tip your body or forcefully lift your leg. 5. Hold this position for __________ seconds. 6. Slowly return to the starting position. Repeat __________ times. Complete this exercise __________ times a day. Squats This exercise strengthens the muscles in the front of your thigh (quadriceps). 1. Stand in a door frame so your feet and knees are in line with the frame. You may place your hands on the frame for balance. 2. Slowly bend your knees  and lower your hips like you are going to sit in a chair. ? Keep your lower legs in a straight-up-and-down position. ? Do not let your hips go lower than your knees. ? Do not bend your knees lower than told by your health care provider. ? If your hip pain increases, do not bend as low. 3. Hold this position for ___________ seconds. 4. Slowly push with your legs to return to standing. Do not use your hands to pull yourself to standing. Repeat __________ times. Complete this exercise __________ times a day. This information is not intended to replace advice given to you by your health care provider. Make sure you discuss any questions you have with your health care provider. Document Revised: 05/11/2019 Document Reviewed: 08/16/2018 Elsevier Patient Education  [place].

## 2020-06-05 ENCOUNTER — Encounter: Payer: Self-pay | Admitting: Family Medicine

## 2020-06-14 ENCOUNTER — Ambulatory Visit: Payer: PPO | Attending: Internal Medicine

## 2020-06-14 DIAGNOSIS — Z23 Encounter for immunization: Secondary | ICD-10-CM

## 2020-06-14 NOTE — Progress Notes (Signed)
   Covid-19 Vaccination Clinic  Name:  Mckinleigh Schuchart    MRN: [id number] DOB: 24-May-1946  06/14/2020  Ms. Bosak was observed post Covid-19 immunization for 15 minutes without incident. She was provided with Vaccine Information Sheet and instruction to access the V-Safe system.   Ms. Zumbro was instructed to call 911 with any severe reactions post vaccine: Marland Kitchen Difficulty breathing  . Swelling of face and throat  . A fast heartbeat  . A bad rash all over body  . Dizziness and weakness

## 2020-06-18 DIAGNOSIS — H353132 Nonexudative age-related macular degeneration, bilateral, intermediate dry stage: Secondary | ICD-10-CM | POA: Diagnosis not present

## 2020-08-01 ENCOUNTER — Other Ambulatory Visit: Payer: Self-pay | Admitting: Family Medicine

## 2020-08-05 NOTE — Telephone Encounter (Signed)
F/u was on 02/14/20, last filled on 02/14/20 #60 caps with 3 refills, please advise

## 2020-08-06 ENCOUNTER — Telehealth: Payer: Self-pay | Admitting: Family Medicine

## 2020-08-06 NOTE — Telephone Encounter (Signed)
Error

## 2020-08-16 ENCOUNTER — Other Ambulatory Visit: Payer: Self-pay

## 2020-08-16 DIAGNOSIS — Z79899 Other long term (current) drug therapy: Secondary | ICD-10-CM | POA: Diagnosis not present

## 2020-08-16 DIAGNOSIS — Z111 Encounter for screening for respiratory tuberculosis: Secondary | ICD-10-CM | POA: Diagnosis not present

## 2020-08-18 ENCOUNTER — Other Ambulatory Visit: Payer: Self-pay | Admitting: Rheumatology

## 2020-08-19 ENCOUNTER — Other Ambulatory Visit: Payer: Self-pay | Admitting: Rheumatology

## 2020-08-19 MED ORDER — ENBREL SURECLICK 50 MG/ML ~~LOC~~ SOAJ
50.0000 mg | SUBCUTANEOUS | 0 refills | Status: DC
Start: 2020-08-19 — End: 2020-11-24

## 2020-08-19 NOTE — Telephone Encounter (Signed)
Last Visit: 06/04/2020 Next Visit: 11/06/2020 Labs: 08/16/2020 CBC and CMP WNL  TB Gold: 08/18/2019 Neg  (updated 08/16/2020, results pending)  Current Dose per office note 5/85/9292: Enbrel SureClick 50 mg every 7 days  DX: Seropositive rheumatoid arthritis of multiple sites   Okay to refill Enbrel?

## 2020-08-19 NOTE — Progress Notes (Signed)
CBC and CMP WNL

## 2020-08-20 LAB — QUANTIFERON-TB GOLD PLUS
Mitogen-NIL: 5.28 IU/mL
NIL: 0.03 IU/mL
QuantiFERON-TB Gold Plus: NEGATIVE
TB1-NIL: 0 IU/mL
TB2-NIL: 0.01 IU/mL

## 2020-08-20 LAB — CBC WITH DIFFERENTIAL/PLATELET
Absolute Monocytes: 529 cells/uL (ref 200–950)
Basophils Absolute: 40 cells/uL (ref 0–200)
Basophils Relative: 0.6 %
Eosinophils Absolute: 141 cells/uL (ref 15–500)
Eosinophils Relative: 2.1 %
HCT: 45.5 % — ABNORMAL HIGH (ref 35.0–45.0)
Hemoglobin: 14.8 g/dL (ref 11.7–15.5)
Lymphs Abs: 1876 cells/uL (ref 850–3900)
MCH: 30.1 pg (ref 27.0–33.0)
MCHC: 32.5 g/dL (ref 32.0–36.0)
MCV: 92.7 fL (ref 80.0–100.0)
MPV: 11.9 fL (ref 7.5–12.5)
Monocytes Relative: 7.9 %
Neutro Abs: 4114 cells/uL (ref 1500–7800)
Neutrophils Relative %: 61.4 %
Platelets: 277 10*3/uL (ref 140–400)
RBC: 4.91 10*6/uL (ref 3.80–5.10)
RDW: 12.3 % (ref 11.0–15.0)
Total Lymphocyte: 28 %
WBC: 6.7 10*3/uL (ref 3.8–10.8)

## 2020-08-20 LAB — COMPLETE METABOLIC PANEL WITH GFR
AG Ratio: 1.5 (calc) (ref 1.0–2.5)
ALT: 11 U/L (ref 6–29)
AST: 16 U/L (ref 10–35)
Albumin: 3.9 g/dL (ref 3.6–5.1)
Alkaline phosphatase (APISO): 88 U/L (ref 37–153)
BUN: 12 mg/dL (ref 7–25)
CO2: 25 mmol/L (ref 20–32)
Calcium: 9.1 mg/dL (ref 8.6–10.4)
Chloride: 105 mmol/L (ref 98–110)
Creat: 0.69 mg/dL (ref 0.60–0.93)
GFR, Est African American: 101 mL/min/{1.73_m2} (ref >=60)
GFR, Est Non African American: 87 mL/min/{1.73_m2} (ref >=60)
Globulin: 2.6 g/dL (calc) (ref 1.9–3.7)
Glucose, Bld: 92 mg/dL (ref 65–99)
Potassium: 4.5 mmol/L (ref 3.5–5.3)
Sodium: 139 mmol/L (ref 135–146)
Total Bilirubin: 0.4 mg/dL (ref 0.2–1.2)
Total Protein: 6.5 g/dL (ref 6.1–8.1)

## 2020-08-20 NOTE — Progress Notes (Signed)
TB gold negative

## 2020-10-01 ENCOUNTER — Telehealth: Payer: Self-pay | Admitting: Pharmacist

## 2020-10-01 NOTE — Telephone Encounter (Signed)
Received renewal application from [place] for ENBREL patient assistance. Submitted provider and patient signed forms, income information, and insurance documents. Will update patient when we receive response.  Case ID: [id number]  Phone: [phone] Fax: [phone]  Knox Saliva, PharmD, MPH Clinical Pharmacist (Rheumatology and Pulmonology)

## 2020-10-24 NOTE — Progress Notes (Signed)
Office Visit Note  Patient: Samantha Valentine             Date of Birth: [birth year]           MRN: [id number]             PCP: Abner Greenspan, MD Referring: Tower, Wynelle Fanny, MD Visit Date: 11/06/2020 Occupation: @GUAROCC @  Subjective:  Joint stiffness   History of Present Illness: Samantha Valentine is a 75 y.o. female with history of seropositive rheumatoid arthritis, osteoarthritis, DDD, and fibromyalgia. She is on enbrel 50 mg sq injections once weekly.  She has not missed any doses of Enbrel recently.  She denies any recent rheumatoid arthritis flares.  She continues to have intermittent pain and stiffness in multiple joints which she attributes to overuse activities.  She has occasional discomfort in the left shoulder joint.  She has ongoing pain and stiffness in both hands but denies any joint swelling.  She states her right hip replacement is doing well.  She denies any discomfort or swelling in her knee joints at this time.  She takes Celebrex 200 mg 1 capsule by mouth twice daily as needed for pain relief.  She has been taking Celebrex very sparingly. She continues to experience intermittent thoracic pain and takes gabapentin 100 mg 1 to 2 capsules by mouth at bedtime for symptomatic relief.  Her main concern is chronic fatigue.  She continues to have difficulty sleeping at night.  Activities of Daily Living:  Patient reports joint stiffness all day  Patient Reports nocturnal pain.  Difficulty dressing/grooming: Denies Difficulty climbing stairs: Reports Difficulty getting out of chair: Reports Difficulty using hands for taps, buttons, cutlery, and/or writing: Reports  Review of Systems  Constitutional: Positive for fatigue.  HENT: Positive for mouth dryness and nose dryness. Negative for mouth sores.   Eyes: Positive for itching and dryness. Negative for pain and visual disturbance.  Respiratory: Positive for cough, shortness of breath and difficulty breathing. Negative for  hemoptysis.   Cardiovascular: Positive for swelling in legs/feet. Negative for chest pain and palpitations.  Gastrointestinal: Negative for abdominal pain, blood in stool, constipation and diarrhea.  Endocrine: Negative for increased urination.  Genitourinary: Negative for painful urination.  Musculoskeletal: Positive for arthralgias, joint pain, joint swelling, morning stiffness and muscle tenderness. Negative for myalgias, muscle weakness and myalgias.  Skin: Negative for color change, rash and redness.  Allergic/Immunologic: Negative for susceptible to infections.  Neurological: Positive for headaches and weakness. Negative for dizziness, numbness and memory loss.  Hematological: Negative for swollen glands.  Psychiatric/Behavioral: Positive for sleep disturbance. Negative for confusion.    PMFS History:  Patient Active Problem List   Diagnosis Date Noted  . Elevated glucose level 11/23/2018  . Routine general medical examination at a health care facility 08/09/2018  . DDD (degenerative disc disease), thoracolumbar 06/28/2018  . DDD (degenerative disc disease), cervical 06/28/2018  . Atherosclerosis of aorta ([place]) 03/11/2018  . Sleep disturbance 06/18/2017  . Tachycardia 05/25/2017  . Dysuria 05/25/2017  . Estrogen deficiency 03/22/2017  . Malignant neoplasm of upper-outer quadrant of left breast in female, estrogen receptor negative ([place]) 02/04/2017  . Tendinopathy of right shoulder 01/25/2017  . High risk medication use 09/29/2016  . DJD (degenerative joint disease), cervical 09/29/2016  . History of right hip replacement 09/29/2016  . Eczema 07/13/2014  . Adverse effect of immunosuppressive drug 04/27/2012  . ANXIETY DEPRESSION 10/28/2008  . Spinal stenosis 12/29/2007  . Hypothyroidism 12/28/2007  . Vitamin D deficiency  12/28/2007  . HEARING LOSS 12/28/2007  . Seropositive rheumatoid arthritis of multiple sites ([place]) 12/28/2007  . DDD (degenerative disc disease), lumbar  12/28/2007  . Fibromyalgia 12/28/2007  . Osteoporosis 12/28/2007  . MIGRAINES, HX OF 12/28/2007    Past Medical History:  Diagnosis Date  . Anxiety   . Arthritis    RA  . Breast cancer ([place]) 01/29/2017  . Breast mass 1 year   . Cancer ([place]) 01/29/2017   left breast INVASIVE MAMMARY CARCINOMA, ER/PR positive  . Cataract 2019   resolved with surgery  . Collagen vascular disease (HCC)    Rhematoid Arthritis  . Complication of anesthesia   . DDD (degenerative disc disease)    in neck  . Depression   . Dyspnea   . Edema    FEET/LEGS  . Emphysema of lung ([place])   . Fibromyalgia   . GERD (gastroesophageal reflux disease)    NO MEDS  . History of hiatal hernia   . History of kidney stones    MULTIPLE KIDNEY STONES BIL  . HOH (hard of hearing)    AIDS  . Hypothyroidism   . Macular degeneration, bilateral   . Migraine   . Osteoporosis   . Palpitations   . Personal history of chemotherapy    prior to mastectomy  . PONV (postoperative nausea and vomiting)    AFTER FIRST CATARACT  . Rheumatoid arthritis ([place])     Family History  Problem Relation Age of Onset  . Alcohol abuse Father   . Diabetes Father   . Stroke Father   . Hypertension Mother   . Endometrial cancer Mother   . Osteoarthritis Mother   . Breast cancer Paternal Aunt 53  . Liver disease Sister   . Breast cancer Maternal Aunt   . Rheum arthritis Maternal Grandmother   . Diabetes Paternal Grandmother   . Parkinson's disease Paternal Grandfather    Past Surgical History:  Procedure Laterality Date  . BREAST BIOPSY Left 01/29/2017   US guided biopsy INVASIVE MAMMARY CARCINOMA  . CATARACT EXTRACTION W/PHACO Left 04/28/2018   Procedure: CATARACT EXTRACTION PHACO AND INTRAOCULAR LENS PLACEMENT (IOC);  Surgeon: Leandrew Koyanagi, MD;  Location: [facility] ORS;  Service: Ophthalmology;  Laterality: Left;  Lot # [id number]   1:03 AP%   13.4 CDE    8.40  . CATARACT EXTRACTION W/PHACO Right 06/09/2018   Procedure:  CATARACT EXTRACTION PHACO AND INTRAOCULAR LENS PLACEMENT (IOC);  Surgeon: Leandrew Koyanagi, MD;  Location: [facility] ORS;  Service: Ophthalmology;  Laterality: Right;  lot# [id number] [place] 0:55 ap 16.3% cde 8.21  . HIP ARTHROPLASTY    . JOINT REPLACEMENT Right 2003   total hip replacement  . LITHOTRIPSY  1997   kidney stone  . MASTECTOMY Left 01/29/2017  . MASTECTOMY W/ SENTINEL NODE BIOPSY Left 05/18/2017   Procedure: MASTECTOMY WITH SENTINEL LYMPH NODE BIOPSY;  Surgeon: Robert Bellow, MD;  Location: [facility] ORS;  Service: General;  Laterality: Left;  . PORT-A-CATH REMOVAL  09/2017  . PORTACATH PLACEMENT Right 02/15/2017   Procedure: INSERTION PORT-A-CATH;  Surgeon: Robert Bellow, MD;  Location: [facility] ORS;  Service: General;  Laterality: Right;  . TONSILLECTOMY  1970   Social History   Social History Narrative  . Not on file   Immunization History  Administered Date(s) Administered  . Influenza, High Dose Seasonal PF 07/28/2017, 07/17/2018  . Influenza,inj,Quad PF,6+ Mos 07/05/2013, 07/13/2014, 08/20/2015  . Influenza-Unspecified 07/19/2016, 07/28/2017, 07/17/2018  . PFIZER(Purple Top)SARS-COV-2 Vaccination 10/30/2019, 11/18/2019, 06/14/2020  .  Pneumococcal Conjugate-13 08/20/2015  . Pneumococcal Polysaccharide-23 11/03/2006, 07/05/2013  . Td 05/03/2002     Objective: Vital Signs: BP (!) 146/94 (BP Location: Right Arm, Patient Position: Sitting, Cuff Size: Normal)   Pulse 82   Ht 5\' 4"  (1.626 m)   Wt 182 lb 6.4 oz (82.7 kg)   BMI 31.31 kg/m    Physical Exam Vitals and nursing note reviewed.  Constitutional:      Appearance: She is well-developed and well-nourished.  HENT:     Head: Normocephalic and atraumatic.  Eyes:     Extraocular Movements: EOM normal.     Conjunctiva/sclera: Conjunctivae normal.  Cardiovascular:     Pulses: Intact distal pulses.  Pulmonary:     Effort: Pulmonary effort is normal.  Abdominal:     Palpations: Abdomen is soft.   Musculoskeletal:     Cervical back: Normal range of motion.  Skin:    General: Skin is warm and dry.     Capillary Refill: Capillary refill takes less than 2 seconds.  Neurological:     Mental Status: She is alert and oriented to person, place, and time.  Psychiatric:        Mood and Affect: Mood and affect normal.        Behavior: Behavior normal.      Musculoskeletal Exam: C-spine slightly limited ROM with lateral rotation.  Postural thoracic kyphosis noted.  Shoulder joints good ROM with no some discomfort in the left shoulder.  Elbow joints, wrist joints, MCPs, PIPs, and DIPs good ROM with no synovitis.  PIP and DIP thickening consistent with osteoarthritis of both hands.  Right hip replacement has good ROM with no discomfort.  Left hip has good ROM with no discomfort.  Knee joints good ROM with no discomfort.  No warmth or effusion of knee joints.  Ankle joints good ROM with tenderness but no inflammation noted.   CDAI Exam: CDAI Score: 1.1  Patient Global: 6 mm; Provider Global: 5 mm Swollen: 0 ; Tender: 0  Joint Exam 11/06/2020   No joint exam has been documented for this visit   There is currently no information documented on the homunculus. Go to the Rheumatology activity and complete the homunculus joint exam.  Investigation: No additional findings.  Imaging: No results found.  Recent Labs: Lab Results  Component Value Date   WBC 6.7 08/16/2020   HGB 14.8 08/16/2020   PLT 277 08/16/2020   NA 139 08/16/2020   K 4.5 08/16/2020   CL 105 08/16/2020   CO2 25 08/16/2020   GLUCOSE 92 08/16/2020   BUN 12 08/16/2020   CREATININE 0.69 08/16/2020   BILITOT 0.4 08/16/2020   ALKPHOS 100 02/14/2020   AST 16 08/16/2020   ALT 11 08/16/2020   PROT 6.5 08/16/2020   ALBUMIN 4.0 02/14/2020   CALCIUM 9.1 08/16/2020   GFRAA 101 08/16/2020   QFTBGOLD NEGATIVE 09/03/2017   QFTBGOLDPLUS NEGATIVE 08/16/2020    Speciality Comments: No specialty comments  available.  Procedures:  No procedures performed Allergies: Adhesive [tape], Cymbalta [duloxetine hcl], Hydroxychloroquine sulfate, Ibandronate sodium, and Risedronate sodium   Assessment / Plan:     Visit Diagnoses: Seropositive rheumatoid arthritis of multiple sites ([place]) - Positive RF with erosive disease: She has no joint tenderness or synovitis on examination today.  She has not had any recent rheumatoid arthritis flares.  She is clinically doing well on Enbrel 50 mg subcutaneous injections every 7 days.  She has not missed any doses of Enbrel recently.  She  has occasional arthralgias and ongoing joint stiffness likely due to previous joint damage and underlying osteoarthritis.  She takes Celebrex 200 mg 1 capsule by mouth twice daily as needed for pain relief.  She has been taking Celebrex very sparingly.  A refill was sent to the pharmacy today.  She will continue on Enbrel as prescribed.  She was advised to notify us if she develops increased joint pain or joint swelling.  She will follow-up in the office in 5 months.    High risk medication use - Enbrel SureClick 50 mg every 7 days. (discontinued SSZ in the past due to constipation).CBC and CMP updated on 08/16/20.  She is due to update lab work.  Orders for CBC and CMP were released.  Her next lab work will be due in April and every 3 months to monitor for drug toxicity.  TB Gold negative on 08/16/20 and will continue to be monitored yearly.  - Plan: CBC with Differential/Platelet, COMPLETE METABOLIC PANEL WITH GFR She has not had any recent infections.  She was advised to hold Enbrel if she develops signs or symptoms of an infection and to resume once infection has completely cleared.  She has received all 3 Pfizer COVID-19 vaccinations.  She was encouraged to continue to wear a mask and social distance.  Primary osteoarthritis of both hands: She has PIP and DIP thickening consistent with osteoarthritis of both hands.  She is able to make a  complete fist bilaterally.  She has ongoing pain and stiffness in both hands due to underlying osteoarthritic changes.  Discussed the importance of joint protection and muscle strengthening.  She was given a handout of hand exercises to perform.  History of right hip replacement: Doing well.  She has good ROM with no discomfort.  No groin pain at this time.   DDD (degenerative disc disease), cervical: She has limited range of motion with lateral rotation bilaterally.  DDD (degenerative disc disease), thoracic - Postural thoracic kyphosis noted.  Chronic pain.  She takes gabapentin 100 mg 1 to 2 capsules by mouth at bedtime which manages her pain.  DDD (degenerative disc disease), lumbar: She experiences occasional discomfort in her lower back.  She has no symptoms of radiculopathy at this time.  Fibromyalgia - She has not had any recent fibromyalgia flares.  She has occasional arthralgias and joint stiffness but has not had much muscle tenderness.  She takes gabapentin 100 mg 1 to 2 capsules by mouth at bedtime and Celebrex 200 mg 1 capsule by mouth twice daily PRN for pain relief.  She continues to have persistent fatigue secondary to insomnia.  We discussed the importance of regular exercise and good sleep hygiene.  Age-related osteoporosis without current pathological fracture - DEXA scan on 05/06/17 showed T score of -2.4.  She has been treated with Forteo and Prolia in the past.  She developed a rash while on risedronate.  She is taking vitamin D 1,000 units daily. She has not had any recent falls or fractures.   Other medical conditions are listed as follows:   Malignant neoplasm of upper-outer quadrant of left breast in female, estrogen receptor negative (HCC)  History of hearing loss  History of vitamin D deficiency: She is taking vitamin D 1,000 units daily.   History of hypothyroidism  History of anxiety  History of depression  History of migraine  Orders: Orders Placed This  Encounter  Procedures  . CBC with Differential/Platelet  . COMPLETE METABOLIC PANEL WITH GFR  Meds ordered this encounter  Medications  . celecoxib (CELEBREX) 200 MG capsule    Sig: Take 1 capsule by mouth twice a day when necessary with meals    Dispense:  120 capsule    Refill:  0      Follow-Up Instructions: Return in about 5 months (around 04/06/2021) for Rheumatoid arthritis, Osteoarthritis, DDD, Fibromyalgia.   Ofilia Neas, PA-C  Note - This record has been created using Dragon software.  Chart creation errors have been sought, but may not always  have been located. Such creation errors do not reflect on  the standard of medical care.

## 2020-11-05 NOTE — Telephone Encounter (Signed)
Enbrel is approved indefinitely per [employer]. No formal approval letter available per plan. I've requested plan to fax approval letter so we can then send to [place].  Knox Saliva, PharmD, MPH Clinical Pharmacist (Rheumatology and Pulmonology)

## 2020-11-05 NOTE — Telephone Encounter (Addendum)
Received fax from [place] that patient's Enbrel PAP application is missing updated prior auth information.  Submitted a Prior Authorization request to [facility] for ENBREL via Cover My Meds. Awaiting clinical questions. Will update once we receive a response.   Key: [id number]

## 2020-11-06 ENCOUNTER — Other Ambulatory Visit: Payer: Self-pay

## 2020-11-06 ENCOUNTER — Encounter: Payer: Self-pay | Admitting: Physician Assistant

## 2020-11-06 ENCOUNTER — Ambulatory Visit: Payer: PPO | Admitting: Physician Assistant

## 2020-11-06 VITALS — BP 146/94 | HR 82 | Ht 64.0 in | Wt 182.4 lb

## 2020-11-06 DIAGNOSIS — C50412 Malignant neoplasm of upper-outer quadrant of left female breast: Secondary | ICD-10-CM | POA: Diagnosis not present

## 2020-11-06 DIAGNOSIS — M5030 Other cervical disc degeneration, unspecified cervical region: Secondary | ICD-10-CM | POA: Diagnosis not present

## 2020-11-06 DIAGNOSIS — M19041 Primary osteoarthritis, right hand: Secondary | ICD-10-CM

## 2020-11-06 DIAGNOSIS — M51369 Other intervertebral disc degeneration, lumbar region without mention of lumbar back pain or lower extremity pain: Secondary | ICD-10-CM

## 2020-11-06 DIAGNOSIS — M797 Fibromyalgia: Secondary | ICD-10-CM | POA: Diagnosis not present

## 2020-11-06 DIAGNOSIS — M810 Age-related osteoporosis without current pathological fracture: Secondary | ICD-10-CM | POA: Diagnosis not present

## 2020-11-06 DIAGNOSIS — Z96641 Presence of right artificial hip joint: Secondary | ICD-10-CM

## 2020-11-06 DIAGNOSIS — M5134 Other intervertebral disc degeneration, thoracic region: Secondary | ICD-10-CM

## 2020-11-06 DIAGNOSIS — M0579 Rheumatoid arthritis with rheumatoid factor of multiple sites without organ or systems involvement: Secondary | ICD-10-CM

## 2020-11-06 DIAGNOSIS — Z8639 Personal history of other endocrine, nutritional and metabolic disease: Secondary | ICD-10-CM

## 2020-11-06 DIAGNOSIS — Z79899 Other long term (current) drug therapy: Secondary | ICD-10-CM

## 2020-11-06 DIAGNOSIS — Z8659 Personal history of other mental and behavioral disorders: Secondary | ICD-10-CM

## 2020-11-06 DIAGNOSIS — Z8669 Personal history of other diseases of the nervous system and sense organs: Secondary | ICD-10-CM

## 2020-11-06 DIAGNOSIS — Z171 Estrogen receptor negative status [ER-]: Secondary | ICD-10-CM

## 2020-11-06 DIAGNOSIS — M5136 Other intervertebral disc degeneration, lumbar region: Secondary | ICD-10-CM

## 2020-11-06 DIAGNOSIS — M19042 Primary osteoarthritis, left hand: Secondary | ICD-10-CM

## 2020-11-06 MED ORDER — CELECOXIB 200 MG PO CAPS
ORAL_CAPSULE | ORAL | 0 refills | Status: DC
Start: 2020-11-06 — End: 2021-11-12

## 2020-11-06 NOTE — Patient Instructions (Addendum)
Standing Labs We placed an order today for your standing lab work.   Please have your standing labs drawn in April and every 3 months   If possible, please have your labs drawn 2 weeks prior to your appointment so that the provider can discuss your results at your appointment.  We have open lab daily Monday through Thursday from 8:30-12:30 PM and 1:30-4:30 PM and Friday from 8:30-12:30 PM and 1:30-4:00 PM at the office of Dr. Bo Merino, [facility].   Please be advised, patients with office appointments requiring lab work will take precedents over walk-in lab work.  If possible, please come for your lab work on Monday and Friday afternoons, as you may experience shorter wait times. The office is located at [address], [place], [place], [place] [address] No appointment is necessary.   Labs are drawn by [facility]. Please bring your co-pay at the time of your lab draw.  You may receive a bill from [facility] for your lab work.  If you wish to have your labs drawn at another location, please call the office 24 hours in advance to send orders.  If you have any questions regarding directions or hours of operation,  please call [phone].   As a reminder, please drink plenty of water prior to coming for your lab work. Thanks!    Hand Exercises Hand exercises can be helpful for almost anyone. These exercises can strengthen the hands, improve flexibility and movement, and increase blood flow to the hands. These results can make work and daily tasks easier. Hand exercises can be especially helpful for people who have joint pain from arthritis or have nerve damage from overuse (carpal tunnel syndrome). These exercises can also help people who have injured a hand. Exercises Most of these hand exercises are gentle stretching and motion exercises. It is usually safe to do them often throughout the day. Warming up your hands before exercise may help to reduce stiffness. You can do  this with gentle massage or by placing your hands in warm water for 10-15 minutes. It is normal to feel some stretching, pulling, tightness, or mild discomfort as you begin new exercises. This will gradually improve. Stop an exercise right away if you feel sudden, severe pain or your pain gets worse. Ask your health care provider which exercises are best for you. Knuckle bend or "claw" fist 1. Stand or sit with your arm, hand, and all five fingers pointed straight up. Make sure to keep your wrist straight during the exercise. 2. Gently bend your fingers down toward your palm until the tips of your fingers are touching the top of your palm. Keep your big knuckle straight and just bend the small knuckles in your fingers. 3. Hold this position for __________ seconds. 4. Straighten (extend) your fingers back to the starting position. Repeat this exercise 5-10 times with each hand. Full finger fist 1. Stand or sit with your arm, hand, and all five fingers pointed straight up. Make sure to keep your wrist straight during the exercise. 2. Gently bend your fingers into your palm until the tips of your fingers are touching the middle of your palm. 3. Hold this position for __________ seconds. 4. Extend your fingers back to the starting position, stretching every joint fully. Repeat this exercise 5-10 times with each hand. Straight fist 1. Stand or sit with your arm, hand, and all five fingers pointed straight up. Make sure to keep your wrist straight during the exercise. 2. Gently  bend your fingers at the big knuckle, where your fingers meet your hand, and the middle knuckle. Keep the knuckle at the tips of your fingers straight and try to touch the bottom of your palm. 3. Hold this position for __________ seconds. 4. Extend your fingers back to the starting position, stretching every joint fully. Repeat this exercise 5-10 times with each hand. Tabletop 1. Stand or sit with your arm, hand, and all five  fingers pointed straight up. Make sure to keep your wrist straight during the exercise. 2. Gently bend your fingers at the big knuckle, where your fingers meet your hand, as far down as you can while keeping the small knuckles in your fingers straight. Think of forming a tabletop with your fingers. 3. Hold this position for __________ seconds. 4. Extend your fingers back to the starting position, stretching every joint fully. Repeat this exercise 5-10 times with each hand. Finger spread 1. Place your hand flat on a table with your palm facing down. Make sure your wrist stays straight as you do this exercise. 2. Spread your fingers and thumb apart from each other as far as you can until you feel a gentle stretch. Hold this position for __________ seconds. 3. Bring your fingers and thumb tight together again. Hold this position for __________ seconds. Repeat this exercise 5-10 times with each hand. Making circles 1. Stand or sit with your arm, hand, and all five fingers pointed straight up. Make sure to keep your wrist straight during the exercise. 2. Make a circle by touching the tip of your thumb to the tip of your index finger. 3. Hold for __________ seconds. Then open your hand wide. 4. Repeat this motion with your thumb and each finger on your hand. Repeat this exercise 5-10 times with each hand. Thumb motion 1. Sit with your forearm resting on a table and your wrist straight. Your thumb should be facing up toward the ceiling. Keep your fingers relaxed as you move your thumb. 2. Lift your thumb up as high as you can toward the ceiling. Hold for __________ seconds. 3. Bend your thumb across your palm as far as you can, reaching the tip of your thumb for the small finger (pinkie) side of your palm. Hold for __________ seconds. Repeat this exercise 5-10 times with each hand. Grip strengthening 1. Hold a stress ball or other soft ball in the middle of your hand. 2. Slowly increase the pressure,  squeezing the ball as much as you can without causing pain. Think of bringing the tips of your fingers into the middle of your palm. All of your finger joints should bend when doing this exercise. 3. Hold your squeeze for __________ seconds, then relax. Repeat this exercise 5-10 times with each hand.   Contact a health care provider if:  Your hand pain or discomfort gets much worse when you do an exercise.  Your hand pain or discomfort does not improve within 2 hours after you exercise. If you have any of these problems, stop doing these exercises right away. Do not do them again unless your health care provider says that you can. Get help right away if:  You develop sudden, severe hand pain or swelling. If this happens, stop doing these exercises right away. Do not do them again unless your health care provider says that you can. This information is not intended to replace advice given to you by your health care provider. Make sure you discuss any questions you have with  your health care provider. Document Revised: 01/26/2019 Document Reviewed: 10/06/2018 Elsevier Patient Education  2021 [employer].

## 2020-11-07 LAB — CBC WITH DIFFERENTIAL/PLATELET
Absolute Monocytes: 614 cells/uL (ref 200–950)
Basophils Absolute: 50 cells/uL (ref 0–200)
Basophils Relative: 0.8 %
Eosinophils Absolute: 192 cells/uL (ref 15–500)
Eosinophils Relative: 3.1 %
HCT: 43.6 % (ref 35.0–45.0)
Hemoglobin: 14.6 g/dL (ref 11.7–15.5)
Lymphs Abs: 1860 cells/uL (ref 850–3900)
MCH: 30.5 pg (ref 27.0–33.0)
MCHC: 33.5 g/dL (ref 32.0–36.0)
MCV: 91.2 fL (ref 80.0–100.0)
MPV: 11.4 fL (ref 7.5–12.5)
Monocytes Relative: 9.9 %
Neutro Abs: 3484 cells/uL (ref 1500–7800)
Neutrophils Relative %: 56.2 %
Platelets: 275 10*3/uL (ref 140–400)
RBC: 4.78 10*6/uL (ref 3.80–5.10)
RDW: 12.4 % (ref 11.0–15.0)
Total Lymphocyte: 30 %
WBC: 6.2 10*3/uL (ref 3.8–10.8)

## 2020-11-07 LAB — COMPLETE METABOLIC PANEL WITH GFR
AG Ratio: 1.4 (calc) (ref 1.0–2.5)
ALT: 11 U/L (ref 6–29)
AST: 14 U/L (ref 10–35)
Albumin: 3.8 g/dL (ref 3.6–5.1)
Alkaline phosphatase (APISO): 83 U/L (ref 37–153)
BUN: 15 mg/dL (ref 7–25)
CO2: 28 mmol/L (ref 20–32)
Calcium: 9.4 mg/dL (ref 8.6–10.4)
Chloride: 105 mmol/L (ref 98–110)
Creat: 0.69 mg/dL (ref 0.60–0.93)
GFR, Est African American: 101 mL/min/{1.73_m2} (ref >=60)
GFR, Est Non African American: 87 mL/min/{1.73_m2} (ref >=60)
Globulin: 2.7 g/dL (calc) (ref 1.9–3.7)
Glucose, Bld: 92 mg/dL (ref 65–99)
Potassium: 4.5 mmol/L (ref 3.5–5.3)
Sodium: 139 mmol/L (ref 135–146)
Total Bilirubin: 0.3 mg/dL (ref 0.2–1.2)
Total Protein: 6.5 g/dL (ref 6.1–8.1)

## 2020-11-07 NOTE — Telephone Encounter (Signed)
Faxed print-out of CMM response for prior auth request for Enbrel to [employer].  Fax: [phone] Phone: [phone]  Knox Saliva, PharmD, MPH Clinical Pharmacist (Rheumatology and Pulmonology)

## 2020-11-07 NOTE — Progress Notes (Signed)
CBC and CMP WNL

## 2020-11-11 NOTE — Telephone Encounter (Signed)
Found patient's PA approval letter. Faxed to [place]. Awaiting response.

## 2020-11-24 ENCOUNTER — Other Ambulatory Visit: Payer: Self-pay

## 2020-11-25 MED ORDER — ENBREL SURECLICK 50 MG/ML ~~LOC~~ SOAJ
50.0000 mg | SUBCUTANEOUS | 0 refills | Status: DC
Start: 2020-11-25 — End: 2021-02-27

## 2020-11-25 NOTE — Telephone Encounter (Signed)
Last Visit: 11/06/2020 Next Visit: 05/06/2021 Labs: 11/06/2020, CBC and CMP WNL TB Gold: 08/16/2020, negative  Current Dose per office note 4/31/5400, Enbrel SureClick 50 mg every 7 days  QQ:[id number] rheumatoid arthritis of multiple sites  Last Fill: 08/19/2020  Okay to refill Enbrel?

## 2020-11-29 NOTE — Telephone Encounter (Signed)
Patient's [employer] application is still pending

## 2020-12-12 NOTE — Telephone Encounter (Signed)
Called [employer] to f/u on Enbrel PAP renewal application. Application is still pending [facility] - they are not missing any information. Prior authorization approval letter on file. Rep has marked case to be reviewed again.  Will f/u  Knox Saliva, PharmD, MPH Clinical Pharmacist (Rheumatology and Pulmonology)

## 2020-12-16 NOTE — Telephone Encounter (Signed)
Received a fax from  [place] regarding an approval for ENBREL patient assistance from 12/14/20 to 10/18/21.   Phone number: [phone]

## 2020-12-20 DIAGNOSIS — Z961 Presence of intraocular lens: Secondary | ICD-10-CM | POA: Diagnosis not present

## 2020-12-20 DIAGNOSIS — H353132 Nonexudative age-related macular degeneration, bilateral, intermediate dry stage: Secondary | ICD-10-CM | POA: Diagnosis not present

## 2020-12-26 ENCOUNTER — Telehealth: Payer: Self-pay | Admitting: Family Medicine

## 2020-12-27 NOTE — Telephone Encounter (Signed)
Patient scheduled f/u with Dr.Tower on 01/30/21. Patient prefers not to have a physical done. Patient requested medication be refilled.  I let her know it would probably be refilled up until her appointment.

## 2020-12-27 NOTE — Telephone Encounter (Signed)
Pharmacy requests refill on: Levothyroxine 137 mcg   LAST REFILL: 02/16/2020 (Q-90, R-2) LAST OV: 02/14/2020 NEXT OV: Not Scheduled  PHARMACY: [place], [place]  TSH (02/14/2020): 1.75

## 2021-01-30 ENCOUNTER — Encounter: Payer: Self-pay | Admitting: Family Medicine

## 2021-01-30 ENCOUNTER — Ambulatory Visit (INDEPENDENT_AMBULATORY_CARE_PROVIDER_SITE_OTHER): Payer: PPO | Admitting: Family Medicine

## 2021-01-30 ENCOUNTER — Other Ambulatory Visit: Payer: Self-pay

## 2021-01-30 VITALS — BP 138/90 | HR 82 | Temp 97.3°F | Ht 64.0 in | Wt 180.3 lb

## 2021-01-30 DIAGNOSIS — E039 Hypothyroidism, unspecified: Secondary | ICD-10-CM | POA: Diagnosis not present

## 2021-01-30 DIAGNOSIS — M4804 Spinal stenosis, thoracic region: Secondary | ICD-10-CM | POA: Diagnosis not present

## 2021-01-30 DIAGNOSIS — R7309 Other abnormal glucose: Secondary | ICD-10-CM | POA: Diagnosis not present

## 2021-01-30 DIAGNOSIS — G479 Sleep disorder, unspecified: Secondary | ICD-10-CM

## 2021-01-30 DIAGNOSIS — M0579 Rheumatoid arthritis with rheumatoid factor of multiple sites without organ or systems involvement: Secondary | ICD-10-CM | POA: Diagnosis not present

## 2021-01-30 DIAGNOSIS — T451X5S Adverse effect of antineoplastic and immunosuppressive drugs, sequela: Secondary | ICD-10-CM | POA: Diagnosis not present

## 2021-01-30 DIAGNOSIS — F341 Dysthymic disorder: Secondary | ICD-10-CM | POA: Diagnosis not present

## 2021-01-30 DIAGNOSIS — E559 Vitamin D deficiency, unspecified: Secondary | ICD-10-CM | POA: Diagnosis not present

## 2021-01-30 LAB — CBC WITH DIFFERENTIAL/PLATELET
Basophils Absolute: 0 10*3/uL (ref 0.0–0.1)
Basophils Relative: 0.6 % (ref 0.0–3.0)
Eosinophils Absolute: 0.2 10*3/uL (ref 0.0–0.7)
Eosinophils Relative: 2.8 % (ref 0.0–5.0)
HCT: 43.9 % (ref 36.0–46.0)
Hemoglobin: 14.6 g/dL (ref 12.0–15.0)
Lymphocytes Relative: 29 % (ref 12.0–46.0)
Lymphs Abs: 1.8 10*3/uL (ref 0.7–4.0)
MCHC: 33.2 g/dL (ref 30.0–36.0)
MCV: 91.5 fl (ref 78.0–100.0)
Monocytes Absolute: 0.6 10*3/uL (ref 0.1–1.0)
Monocytes Relative: 10.1 % (ref 3.0–12.0)
Neutro Abs: 3.5 10*3/uL (ref 1.4–7.7)
Neutrophils Relative %: 57.5 % (ref 43.0–77.0)
Platelets: 219 10*3/uL (ref 150.0–400.0)
RBC: 4.8 Mil/uL (ref 3.87–5.11)
RDW: 14.1 % (ref 11.5–15.5)
WBC: 6.1 10*3/uL (ref 4.0–10.5)

## 2021-01-30 LAB — VITAMIN D 25 HYDROXY (VIT D DEFICIENCY, FRACTURES): VITD: 35.49 ng/mL (ref 30.00–100.00)

## 2021-01-30 LAB — COMPREHENSIVE METABOLIC PANEL
ALT: 13 U/L (ref 0–35)
AST: 16 U/L (ref 0–37)
Albumin: 3.6 g/dL (ref 3.5–5.2)
Alkaline Phosphatase: 85 U/L (ref 39–117)
BUN: 15 mg/dL (ref 6–23)
CO2: 27 mEq/L (ref 19–32)
Calcium: 9.2 mg/dL (ref 8.4–10.5)
Chloride: 101 mEq/L (ref 96–112)
Creatinine, Ser: 0.75 mg/dL (ref 0.40–1.20)
GFR: 79.09 mL/min (ref >60.00)
Glucose, Bld: 90 mg/dL (ref 70–99)
Potassium: 4.7 mEq/L (ref 3.5–5.1)
Sodium: 137 mEq/L (ref 135–145)
Total Bilirubin: 0.7 mg/dL (ref 0.2–1.2)
Total Protein: 6.7 g/dL (ref 6.0–8.3)

## 2021-01-30 LAB — TSH: TSH: 3.19 u[IU]/mL (ref 0.35–4.50)

## 2021-01-30 LAB — HEMOGLOBIN A1C: Hgb A1c MFr Bld: 5.6 % (ref 4.6–6.5)

## 2021-01-30 MED ORDER — GABAPENTIN 100 MG PO CAPS: ORAL_CAPSULE | ORAL | 3 refills | Status: DC

## 2021-01-30 MED ORDER — LORAZEPAM 1 MG PO TABS: 1.0000 mg | ORAL_TABLET | Freq: Every evening | ORAL | 1 refills | Status: DC | PRN

## 2021-01-30 NOTE — Assessment & Plan Note (Signed)
Pt thinks she is doing ok w/o medicine  Getting out and seeing friends now  Reviewed stressors/ coping techniques/symptoms/ support sources/ tx options and side effects in detail today

## 2021-01-30 NOTE — Progress Notes (Signed)
Subjective:    Patient ID: Samantha Valentine, female    DOB: 1946-04-07, 75 y.o.   MRN: [id number]  This visit occurred during the SARS-CoV-2 public health emergency.  Safety protocols were in place, including screening questions prior to the visit, additional usage of staff PPE, and extensive cleaning of exam room while observing appropriate contact time as indicated for disinfecting solutions.    HPI Pt presents for f/u of chronic medical problems  Wt Readings from Last 3 Encounters:  01/30/21 180 lb 5 oz (81.8 kg)  11/06/20 182 lb 6.4 oz (82.7 kg)  06/04/20 183 lb (83 kg)   30.95 kg/m  Good appetite overall   Has been doing ok  No big changes   Struggle to exercise due to her rheumatoid disease   Hypothyroidism Lab Results  Component Value Date   TSH 1.75 02/14/2020  taking levothyroxine 137 mcg daily  Feels about the same energy wise   Gabapentin has helped with TS pain and sleep and neuropathy symptoms  Would like to increase to tid   OP Had forteo in the past  Allergic to bisphosphonates  Rheumatology -same   Mood -fair, socializing more  Wants to get outdoors   Vit D def -takes 1000 iu daily   Lab Results  Component Value Date   CREATININE 0.69 11/06/2020   BUN 15 11/06/2020   NA 139 11/06/2020   K 4.5 11/06/2020   CL 105 11/06/2020   CO2 28 11/06/2020   Takes celebrex  Lab Results  Component Value Date   ALT 11 11/06/2020   AST 14 11/06/2020   ALKPHOS 100 02/14/2020   BILITOT 0.3 11/06/2020    Due for labs  Patient Active Problem List   Diagnosis Date Noted  . Elevated glucose level 11/23/2018  . Routine general medical examination at a health care facility 08/09/2018  . DDD (degenerative disc disease), thoracolumbar 06/28/2018  . DDD (degenerative disc disease), cervical 06/28/2018  . Atherosclerosis of aorta ([place]) 03/11/2018  . Sleep disturbance 06/18/2017  . Tachycardia 05/25/2017  . Dysuria 05/25/2017  . Estrogen deficiency  03/22/2017  . Malignant neoplasm of upper-outer quadrant of left breast in female, estrogen receptor negative ([place]) 02/04/2017  . Tendinopathy of right shoulder 01/25/2017  . High risk medication use 09/29/2016  . DJD (degenerative joint disease), cervical 09/29/2016  . History of right hip replacement 09/29/2016  . Eczema 07/13/2014  . Adverse effect of immunosuppressive drug 04/27/2012  . ANXIETY DEPRESSION 10/28/2008  . Spinal stenosis 12/29/2007  . Hypothyroidism 12/28/2007  . Vitamin D deficiency 12/28/2007  . HEARING LOSS 12/28/2007  . Seropositive rheumatoid arthritis of multiple sites ([place]) 12/28/2007  . DDD (degenerative disc disease), lumbar 12/28/2007  . Fibromyalgia 12/28/2007  . Osteoporosis 12/28/2007  . MIGRAINES, HX OF 12/28/2007   Past Medical History:  Diagnosis Date  . Anxiety   . Arthritis    RA  . Breast cancer ([place]) 01/29/2017  . Breast mass 1 year   . Cancer ([place]) 01/29/2017   left breast INVASIVE MAMMARY CARCINOMA, ER/PR positive  . Cataract 2019   resolved with surgery  . Collagen vascular disease ([facility])    Rhematoid Arthritis  . Complication of anesthesia   . DDD (degenerative disc disease)    in neck  . Depression   . Dyspnea   . Edema    FEET/LEGS  . Emphysema of lung ([place])   . Fibromyalgia   . GERD (gastroesophageal reflux disease)    NO MEDS  .  History of hiatal hernia   . History of kidney stones    MULTIPLE KIDNEY STONES BIL  . HOH (hard of hearing)    AIDS  . Hypothyroidism   . Macular degeneration, bilateral   . Migraine   . Osteoporosis   . Palpitations   . Personal history of chemotherapy    prior to mastectomy  . PONV (postoperative nausea and vomiting)    AFTER FIRST CATARACT  . Rheumatoid arthritis [facility])    Past Surgical History:  Procedure Laterality Date  . BREAST BIOPSY Left 01/29/2017   US guided biopsy INVASIVE MAMMARY CARCINOMA  . CATARACT EXTRACTION W/PHACO Left 04/28/2018   Procedure: CATARACT EXTRACTION  PHACO AND INTRAOCULAR LENS PLACEMENT (IOC);  Surgeon: Leandrew Koyanagi, MD;  Location: [facility] ORS;  Service: Ophthalmology;  Laterality: Left;  Lot # [id number]   1:03 AP%   13.4 CDE    8.40  . CATARACT EXTRACTION W/PHACO Right 06/09/2018   Procedure: CATARACT EXTRACTION PHACO AND INTRAOCULAR LENS PLACEMENT (IOC);  Surgeon: Leandrew Koyanagi, MD;  Location: [facility] ORS;  Service: Ophthalmology;  Laterality: Right;  lot# [id number] [place] 0:55 ap 16.3% cde 8.21  . HIP ARTHROPLASTY    . JOINT REPLACEMENT Right 2003   total hip replacement  . LITHOTRIPSY  1997   kidney stone  . MASTECTOMY Left 01/29/2017  . MASTECTOMY W/ SENTINEL NODE BIOPSY Left 05/18/2017   Procedure: MASTECTOMY WITH SENTINEL LYMPH NODE BIOPSY;  Surgeon: Robert Bellow, MD;  Location: [facility] ORS;  Service: General;  Laterality: Left;  . PORT-A-CATH REMOVAL  09/2017  . PORTACATH PLACEMENT Right 02/15/2017   Procedure: INSERTION PORT-A-CATH;  Surgeon: Robert Bellow, MD;  Location: [facility] ORS;  Service: General;  Laterality: Right;  . TONSILLECTOMY  1970   Social History   Tobacco Use  . Smoking status: Former Smoker    Packs/day: 1.50    Years: 25.00    Pack years: 37.50    Types: Cigarettes    Quit date: 10/20/1983    Years since quitting: 37.3  . Smokeless tobacco: Never Used  Vaping Use  . Vaping Use: Never used  Substance Use Topics  . Alcohol use: No  . Drug use: No   Family History  Problem Relation Age of Onset  . Alcohol abuse Father   . Diabetes Father   . Stroke Father   . Hypertension Mother   . Endometrial cancer Mother   . Osteoarthritis Mother   . Breast cancer Paternal Aunt 41  . Liver disease Sister   . Breast cancer Maternal Aunt   . Rheum arthritis Maternal Grandmother   . Diabetes Paternal Grandmother   . Parkinson's disease Paternal Grandfather    Allergies  Allergen Reactions  . Adhesive [Tape] Other (See Comments)    Skin blistered (PAPER TAPE OK)  . Cymbalta [Duloxetine Hcl]      Headache Inc bp  . Hydroxychloroquine Sulfate Rash  . Ibandronate Sodium Rash  . Risedronate Sodium Rash   Current Outpatient Medications on File Prior to Visit  Medication Sig Dispense Refill  . aspirin EC 81 MG tablet Take 81 mg by mouth daily.    . celecoxib (CELEBREX) 200 MG capsule Take 1 capsule by mouth twice a day when necessary with meals 120 capsule 0  . cholecalciferol (VITAMIN D) 1000 units tablet Take 1,000 Units by mouth daily.    Marland Kitchen etanercept (ENBREL SURECLICK) 50 MG/ML injection Inject 50 mg into the skin once a week. 12 mL 0  . levothyroxine (SYNTHROID)  137 MCG tablet TAKE 1 TABLET BY MOUTH ONCE DAILY BEFORE BREAKFAST 90 tablet 0   No current facility-administered medications on file prior to visit.    Review of Systems  Constitutional: Negative for activity change, appetite change, fatigue, fever and unexpected weight change.  HENT: Negative for congestion, ear pain, rhinorrhea, sinus pressure and sore throat.   Eyes: Negative for pain, redness and visual disturbance.  Respiratory: Negative for cough, shortness of breath and wheezing.   Cardiovascular: Negative for chest pain and palpitations.  Gastrointestinal: Negative for abdominal pain, blood in stool, constipation and diarrhea.  Endocrine: Negative for polydipsia and polyuria.  Genitourinary: Negative for dysuria, frequency and urgency.  Musculoskeletal: Positive for arthralgias and back pain. Negative for myalgias.  Skin: Negative for pallor and rash.  Allergic/Immunologic: Negative for environmental allergies.  Neurological: Negative for dizziness, syncope and headaches.  Hematological: Negative for adenopathy. Does not bruise/bleed easily.  Psychiatric/Behavioral: Positive for sleep disturbance. Negative for decreased concentration and dysphoric mood. The patient is not nervous/anxious.        Objective:   Physical Exam Constitutional:      General: She is not in acute distress.    Appearance:  Normal appearance. She is well-developed. She is obese. She is not ill-appearing.  HENT:     Head: Normocephalic and atraumatic.     Right Ear: External ear normal.     Left Ear: External ear normal.     Nose: Nose normal.  Eyes:     General: No scleral icterus.       Right eye: No discharge.        Left eye: No discharge.     Conjunctiva/sclera: Conjunctivae normal.     Pupils: Pupils are equal, round, and reactive to light.  Neck:     Thyroid: No thyromegaly.     Vascular: No carotid bruit or JVD.     Comments: No thyroid enlargement  Cardiovascular:     Rate and Rhythm: Normal rate and regular rhythm.     Heart sounds: Normal heart sounds. No gallop.   Pulmonary:     Effort: Pulmonary effort is normal. No respiratory distress.     Breath sounds: Normal breath sounds. No wheezing or rales.  Abdominal:     General: Bowel sounds are normal. There is no distension or abdominal bruit.     Palpations: Abdomen is soft. There is no mass.     Tenderness: There is no abdominal tenderness.  Musculoskeletal:        General: No tenderness.     Cervical back: Normal range of motion and neck supple.     Right lower leg: No edema.     Left lower leg: No edema.  Lymphadenopathy:     Cervical: No cervical adenopathy.  Skin:    General: Skin is warm and dry.     Coloration: Skin is not pale.     Findings: No erythema or rash.  Neurological:     Mental Status: She is alert.     Cranial Nerves: No cranial nerve deficit.     Motor: No abnormal muscle tone.     Coordination: Coordination normal.     Deep Tendon Reflexes: Reflexes are normal and symmetric.  Psychiatric:        Mood and Affect: Mood normal.           Assessment & Plan:   Problem List Items Addressed This Visit      Endocrine   Hypothyroidism - Primary  No clinical changes  Energy level is stable  TSH today  Taking levothyroxine 137 mcg daily  Will adj if needed No change in exam      Relevant Orders    TSH     Musculoskeletal and Integument   Seropositive rheumatoid arthritis of multiple sites ([place])    Continues f/u with Dr Garen Grams celebrex enbrel Cbc and cmet today        Other   Vitamin D deficiency    D level today  Taking 1000 iu daily  Disc imp to bone and overall health       Relevant Orders   VITAMIN D 25 Hydroxy (Vit-D Deficiency, Fractures)   ANXIETY DEPRESSION    Pt thinks she is doing ok w/o medicine  Getting out and seeing friends now  Reviewed stressors/ coping techniques/symptoms/ support sources/ tx options and side effects in detail today       Relevant Medications   LORazepam (ATIVAN) 1 MG tablet   Spinal stenosis    Gabapentin is helpful  No side eff Plan to inc to 100 mg tid      Adverse effect of immunosuppressive drug    enbrel Cbc and cmet today      Relevant Orders   CBC with Differential/Platelet   Comprehensive metabolic panel   Sleep disturbance    Gabapentin for back pain has helped significantly       Elevated glucose level    A1C today  No change in diet or weight  disc imp of low glycemic diet and wt loss to prevent DM2       Relevant Orders   Hemoglobin A1c

## 2021-01-30 NOTE — Assessment & Plan Note (Signed)
A1C today  No change in diet or weight  disc imp of low glycemic diet and wt loss to prevent DM2

## 2021-01-30 NOTE — Assessment & Plan Note (Signed)
No clinical changes  Energy level is stable  TSH today  Taking levothyroxine 137 mcg daily  Will adj if needed No change in exam

## 2021-01-30 NOTE — Assessment & Plan Note (Signed)
Gabapentin is helpful  No side eff Plan to inc to 100 mg tid

## 2021-01-30 NOTE — Assessment & Plan Note (Signed)
Continues f/u with Dr Garen Grams celebrex enbrel Cbc and cmet today

## 2021-01-30 NOTE — Assessment & Plan Note (Signed)
D level today  Taking 1000 iu daily  Disc imp to bone and overall health

## 2021-01-30 NOTE — Assessment & Plan Note (Signed)
Gabapentin for back pain has helped significantly

## 2021-01-30 NOTE — Assessment & Plan Note (Signed)
enbrel Cbc and cmet today

## 2021-01-30 NOTE — Patient Instructions (Addendum)
Schedule your mammogram if you want to get it   Take care of yourself   Labs today  We will adjust levothyroxine if needed

## 2021-02-21 ENCOUNTER — Encounter: Payer: Self-pay | Admitting: Family Medicine

## 2021-02-27 ENCOUNTER — Other Ambulatory Visit: Payer: Self-pay

## 2021-02-27 MED ORDER — ENBREL SURECLICK 50 MG/ML ~~LOC~~ SOAJ: 50.0000 mg | SUBCUTANEOUS | 0 refills | Status: DC

## 2021-02-27 NOTE — Telephone Encounter (Signed)
Next Visit: 05/06/2021  Last Visit: 11/06/2020  Last Fill: 11/25/2020   DX: Seropositive rheumatoid arthritis of multiple sites   Current Dose per office note 06/12/36, Enbrel SureClick 50 mg every 7 days  Labs: 01/30/2021, WNL  TB Gold: 08/16/2020, negative  Okay to refill Enbrel?

## 2021-03-27 ENCOUNTER — Telehealth: Payer: Self-pay | Admitting: Family Medicine

## 2021-03-27 NOTE — Chronic Care Management (AMB) (Signed)
  Chronic Care Management   Outreach Note  03/27/2021 Name: Samantha Valentine MRN: [id number] DOB: January 19, 1946  Referred by: Tower, Wynelle Fanny, MD Reason for referral : No chief complaint on file.   An unsuccessful telephone outreach was attempted today. The patient was referred to the pharmacist for assistance with care management and care coordination.   Follow Up Plan:   Tatjana Dellinger Upstream Scheduler

## 2021-03-31 ENCOUNTER — Other Ambulatory Visit: Payer: Self-pay | Admitting: Family Medicine

## 2021-04-22 NOTE — Progress Notes (Signed)
Office Visit Note  Patient: Samantha Valentine             Date of Birth: 08-Sep-1946           MRN: [id number]             PCP: Abner Greenspan, MD Referring: Tower, Wynelle Fanny, MD Visit Date: 05/06/2021 Occupation: @GUAROCC @  Subjective:  Medication management   History of Present Illness: Samantha Valentine is a 75 y.o. female with a history of rheumatoid arthritis, osteoarthritis and degenerative disc disease.  She denies having any increased joint swelling.  She continues to have some stiffness in her hands and her ankles.  She has been taking Enbrel on a weekly basis.  She continues to have discomfort in her cervical, thoracic and lumbar spine.  She states her hip replacement is doing well.  Activities of Daily Living:  Patient reports morning stiffness for all day. Patient Reports nocturnal pain.  Difficulty dressing/grooming: Denies Difficulty climbing stairs: Reports Difficulty getting out of chair: Reports Difficulty using hands for taps, buttons, cutlery, and/or writing: Reports  Review of Systems  Constitutional:  Positive for fatigue.  HENT:  Positive for mouth dryness and nose dryness. Negative for mouth sores.   Eyes:  Positive for dryness. Negative for pain and itching.  Respiratory:  Negative for shortness of breath and difficulty breathing.   Cardiovascular:  Negative for chest pain and palpitations.  Gastrointestinal:  Negative for blood in stool, constipation and diarrhea.  Endocrine: Negative for increased urination.  Genitourinary:  Negative for difficulty urinating.  Musculoskeletal:  Positive for joint pain, joint pain, joint swelling and morning stiffness. Negative for myalgias, muscle tenderness and myalgias.  Skin:  Negative for color change, rash and redness.  Allergic/Immunologic: Negative for susceptible to infections.  Neurological:  Positive for numbness and headaches. Negative for dizziness and memory loss.  Hematological:  Negative for  bruising/bleeding tendency.  Psychiatric/Behavioral:  Negative for confusion.    PMFS History:  Patient Active Problem List   Diagnosis Date Noted   Elevated glucose level 11/23/2018   Routine general medical examination at a health care facility 08/09/2018   DDD (degenerative disc disease), thoracolumbar 06/28/2018   DDD (degenerative disc disease), cervical 06/28/2018   Atherosclerosis of aorta ([place]) 03/11/2018   Sleep disturbance 06/18/2017   Tachycardia 05/25/2017   Dysuria 05/25/2017   Estrogen deficiency 03/22/2017   Malignant neoplasm of upper-outer quadrant of left breast in female, estrogen receptor negative ([place]) 02/04/2017   Tendinopathy of right shoulder 01/25/2017   High risk medication use 09/29/2016   DJD (degenerative joint disease), cervical 09/29/2016   History of right hip replacement 09/29/2016   Eczema 07/13/2014   Adverse effect of immunosuppressive drug 04/27/2012   ANXIETY DEPRESSION 10/28/2008   Spinal stenosis 12/29/2007   Hypothyroidism 12/28/2007   Vitamin D deficiency 12/28/2007   HEARING LOSS 12/28/2007   Seropositive rheumatoid arthritis of multiple sites ([place]) 12/28/2007   DDD (degenerative disc disease), lumbar 12/28/2007   Fibromyalgia 12/28/2007   Osteoporosis 12/28/2007   MIGRAINES, HX OF 12/28/2007    Past Medical History:  Diagnosis Date   Anxiety    Arthritis    RA   Breast cancer ([place]) 01/29/2017   Breast mass 1 year    Cancer ([place]) 01/29/2017   left breast INVASIVE MAMMARY CARCINOMA, ER/PR positive   Cataract 2019   resolved with surgery   Collagen vascular disease ([place])    Rhematoid Arthritis   Complication of anesthesia  DDD (degenerative disc disease)    in neck   Depression    Dyspnea    Edema    FEET/LEGS   Emphysema of lung ([facility])    Fibromyalgia    GERD (gastroesophageal reflux disease)    NO MEDS   History of hiatal hernia    History of kidney stones    MULTIPLE KIDNEY STONES BIL   HOH (hard of hearing)     AIDS   Hypothyroidism    Macular degeneration, bilateral    Migraine    Osteoporosis    Palpitations    Personal history of chemotherapy    prior to mastectomy   PONV (postoperative nausea and vomiting)    AFTER FIRST CATARACT   Rheumatoid arthritis ([place])     Family History  Problem Relation Age of Onset   Hypertension Mother    Endometrial cancer Mother    Osteoarthritis Mother    Alcohol abuse Father    Diabetes Father    Stroke Father    Liver disease Sister    Breast cancer Maternal Aunt    Breast cancer Paternal Aunt 89   Rheum arthritis Maternal Grandmother    Diabetes Paternal Grandmother    Parkinson's disease Paternal Grandfather    Past Surgical History:  Procedure Laterality Date   BREAST BIOPSY Left 01/29/2017   US guided biopsy INVASIVE MAMMARY CARCINOMA   CATARACT EXTRACTION W/PHACO Left 04/28/2018   Procedure: CATARACT EXTRACTION PHACO AND INTRAOCULAR LENS PLACEMENT ([place]);  Surgeon: Leandrew Koyanagi, MD;  Location: [facility] ORS;  Service: Ophthalmology;  Laterality: Left;  Lot # [id number]   1:03 AP%   13.4 CDE    8.40   CATARACT EXTRACTION W/PHACO Right 06/09/2018   Procedure: CATARACT EXTRACTION PHACO AND INTRAOCULAR LENS PLACEMENT ([facility]);  Surgeon: Leandrew Koyanagi, MD;  Location: [facility] ORS;  Service: Ophthalmology;  Laterality: Right;  lot# [id number] [place] 0:55 ap 16.3% cde 8.21   HIP ARTHROPLASTY     JOINT REPLACEMENT Right 2003   total hip replacement   LITHOTRIPSY  1997   kidney stone   MASTECTOMY Left 01/29/2017   MASTECTOMY W/ SENTINEL NODE BIOPSY Left 05/18/2017   Procedure: MASTECTOMY WITH SENTINEL LYMPH NODE BIOPSY;  Surgeon: Robert Bellow, MD;  Location: [facility] ORS;  Service: General;  Laterality: Left;   PORT-A-CATH REMOVAL  09/2017   PORTACATH PLACEMENT Right 02/15/2017   Procedure: INSERTION PORT-A-CATH;  Surgeon: Robert Bellow, MD;  Location: [facility] ORS;  Service: General;  Laterality: Right;   TONSILLECTOMY  1970   Social History    Social History Narrative   Not on file   Immunization History  Administered Date(s) Administered   Influenza, High Dose Seasonal PF 07/28/2017, 07/17/2018, 07/26/2019   Influenza,inj,Quad PF,6+ Mos 07/05/2013, 07/13/2014, 08/20/2015   Influenza-Unspecified 07/19/2016, 07/28/2017, 07/17/2018   PFIZER(Purple Top)SARS-COV-2 Vaccination 10/30/2019, 11/18/2019, 06/14/2020   Pneumococcal Conjugate-13 08/20/2015   Pneumococcal Polysaccharide-23 11/03/2006, 07/05/2013   Td 05/03/2002     Objective: Vital Signs: BP (!) 159/88 (BP Location: Right Arm, Patient Position: Sitting, Cuff Size: Normal)   Pulse 79   Resp 16   Ht 5\' 4"  (1.626 m)   Wt 176 lb 12.8 oz (80.2 kg)   BMI 30.35 kg/m    Physical Exam Vitals and nursing note reviewed.  Constitutional:      Appearance: She is well-developed.  HENT:     Head: Normocephalic and atraumatic.  Eyes:     Conjunctiva/sclera: Conjunctivae normal.  Cardiovascular:     Rate and Rhythm: Normal  rate and regular rhythm.     Heart sounds: Normal heart sounds.  Pulmonary:     Effort: Pulmonary effort is normal.     Breath sounds: Normal breath sounds.  Abdominal:     General: Bowel sounds are normal.     Palpations: Abdomen is soft.  Musculoskeletal:     Cervical back: Normal range of motion.  Lymphadenopathy:     Cervical: No cervical adenopathy.  Skin:    General: Skin is warm and dry.     Capillary Refill: Capillary refill takes less than 2 seconds.  Neurological:     Mental Status: She is alert and oriented to person, place, and time.  Psychiatric:        Behavior: Behavior normal.     Musculoskeletal Exam: C-spine was in good range of motion.  She had no tenderness over thoracic or lumbar spine.  Shoulder joints, elbow joints, wrist joints with good range of motion.  She has synovial thickening over MCP joints but no synovitis was noted.  Hip joints and knee joints with good range of motion.  She is synovial thickening over ankle  joints.  No synovitis was noted.  She had no tenderness over MTPs.  CDAI Exam: CDAI Score: 0.8  Patient Global: 6 mm; Provider Global: 2 mm Swollen: 0 ; Tender: 1  Joint Exam 05/06/2021      Right  Left  Lumbar Spine   Tender        Investigation: No additional findings.  Imaging: No results found.  Recent Labs: Lab Results  Component Value Date   WBC 6.1 01/30/2021   HGB 14.6 01/30/2021   PLT 219.0 01/30/2021   NA 137 01/30/2021   K 4.7 01/30/2021   CL 101 01/30/2021   CO2 27 01/30/2021   GLUCOSE 90 01/30/2021   BUN 15 01/30/2021   CREATININE 0.75 01/30/2021   BILITOT 0.7 01/30/2021   ALKPHOS 85 01/30/2021   AST 16 01/30/2021   ALT 13 01/30/2021   PROT 6.7 01/30/2021   ALBUMIN 3.6 01/30/2021   CALCIUM 9.2 01/30/2021   GFRAA 101 11/06/2020   QFTBGOLD NEGATIVE 09/03/2017   QFTBGOLDPLUS NEGATIVE 08/16/2020    Speciality Comments: No specialty comments available.  Procedures:  No procedures performed Allergies: Adhesive [tape], Cymbalta [duloxetine hcl], Hydroxychloroquine sulfate, Ibandronate sodium, and Risedronate sodium   Assessment / Plan:     Visit Diagnoses: Seropositive rheumatoid arthritis of multiple sites ([place]) - Positive RF with erosive disease: She continues to have joint pain and joint stiffness.  No synovitis was noted.  She has synovial thickening and multiple joints as described above.  High risk medication use - Enbrel SureClick 50 mg every 7 days. (discontinued SSZ in the past due to constipation). - Plan: CBC with Differential/Platelet, COMPLETE METABOLIC PANEL WITH GFR, QuantiFERON-TB Gold Plus today and then every 3 months to monitor for drug toxicity.  She has been advised to stop Enbrel in case she develops an infection.  She was advised to a fourth dose of COVID-19 vaccine.  Instructions regarding other vaccinations were also placed in the AVS.  I advised an annual skin examination for screening of nonmelanoma skin cancer.  Primary  osteoarthritis of both hands-she has DIP and PIP thickening.  She also have MCP thickening.  History of right hip replacement-she had good range of motion without discomfort.  DDD (degenerative disc disease), cervical-she had good range of motion without discomfort.  DDD (degenerative disc disease), thoracic - gabapentin 100 mg 1 to 2 capsules  by mouth at bedtime which manages her pain.  Chronic pain.  DDD (degenerative disc disease), lumbar-she has chronic pain and discomfort.  Fibromyalgia - gabapentin 100 mg 1 to 2 capsules by mouth at bedtime and Celebrex 200 mg 1 capsule by mouth twice daily PRN for pain relief.  Age-related osteoporosis without current pathological fracture - DEXA scan on 05/06/17 showed T score of -2.4.  She has been treated with Forteo and Prolia in the past.  She developed a rash while on risedronate.  I had detailed discussion again but she declined any treatment for her osteoporosis.  She does not want to get a DEXA.  Malignant neoplasm of upper-outer quadrant of left breast in female, estrogen receptor negative ([place])  History of vitamin D deficiency  History of hearing loss  History of hypothyroidism  History of depression  History of anxiety  History of migraine  Orders: Orders Placed This Encounter  Procedures   CBC with Differential/Platelet   COMPLETE METABOLIC PANEL WITH GFR   QuantiFERON-TB Gold Plus    Meds ordered this encounter  Medications   etanercept (ENBREL SURECLICK) 50 MG/ML injection    Sig: Inject 50 mg into the skin once a week.    Dispense:  12 mL    Refill:  0      Follow-Up Instructions: Return in about 5 months (around 10/06/2021) for Rheumatoid arthritis.   Bo Merino, MD  Note - This record has been created using Editor, commissioning.  Chart creation errors have been sought, but may not always  have been located. Such creation errors do not reflect on  the standard of medical care.

## 2021-05-06 ENCOUNTER — Encounter: Payer: Self-pay | Admitting: Rheumatology

## 2021-05-06 ENCOUNTER — Ambulatory Visit: Payer: PPO | Admitting: Rheumatology

## 2021-05-06 ENCOUNTER — Other Ambulatory Visit: Payer: Self-pay

## 2021-05-06 VITALS — BP 159/88 | HR 79 | Resp 16 | Ht 64.0 in | Wt 176.8 lb

## 2021-05-06 DIAGNOSIS — Z96641 Presence of right artificial hip joint: Secondary | ICD-10-CM | POA: Diagnosis not present

## 2021-05-06 DIAGNOSIS — M5030 Other cervical disc degeneration, unspecified cervical region: Secondary | ICD-10-CM

## 2021-05-06 DIAGNOSIS — M19042 Primary osteoarthritis, left hand: Secondary | ICD-10-CM

## 2021-05-06 DIAGNOSIS — M810 Age-related osteoporosis without current pathological fracture: Secondary | ICD-10-CM | POA: Diagnosis not present

## 2021-05-06 DIAGNOSIS — M5136 Other intervertebral disc degeneration, lumbar region: Secondary | ICD-10-CM | POA: Diagnosis not present

## 2021-05-06 DIAGNOSIS — M5134 Other intervertebral disc degeneration, thoracic region: Secondary | ICD-10-CM | POA: Diagnosis not present

## 2021-05-06 DIAGNOSIS — M797 Fibromyalgia: Secondary | ICD-10-CM

## 2021-05-06 DIAGNOSIS — M19041 Primary osteoarthritis, right hand: Secondary | ICD-10-CM | POA: Diagnosis not present

## 2021-05-06 DIAGNOSIS — Z79899 Other long term (current) drug therapy: Secondary | ICD-10-CM

## 2021-05-06 DIAGNOSIS — Z171 Estrogen receptor negative status [ER-]: Secondary | ICD-10-CM

## 2021-05-06 DIAGNOSIS — Z8669 Personal history of other diseases of the nervous system and sense organs: Secondary | ICD-10-CM

## 2021-05-06 DIAGNOSIS — M0579 Rheumatoid arthritis with rheumatoid factor of multiple sites without organ or systems involvement: Secondary | ICD-10-CM

## 2021-05-06 DIAGNOSIS — C50412 Malignant neoplasm of upper-outer quadrant of left female breast: Secondary | ICD-10-CM | POA: Diagnosis not present

## 2021-05-06 DIAGNOSIS — Z8659 Personal history of other mental and behavioral disorders: Secondary | ICD-10-CM

## 2021-05-06 DIAGNOSIS — Z8639 Personal history of other endocrine, nutritional and metabolic disease: Secondary | ICD-10-CM

## 2021-05-06 LAB — COMPLETE METABOLIC PANEL WITH GFR
AG Ratio: 1.3 (calc) (ref 1.0–2.5)
ALT: 14 U/L (ref 6–29)
AST: 17 U/L (ref 10–35)
Albumin: 3.7 g/dL (ref 3.6–5.1)
Alkaline phosphatase (APISO): 78 U/L (ref 37–153)
BUN: 13 mg/dL (ref 7–25)
CO2: 28 mmol/L (ref 20–32)
Calcium: 9.5 mg/dL (ref 8.6–10.4)
Chloride: 104 mmol/L (ref 98–110)
Creat: 0.77 mg/dL (ref 0.60–1.00)
Globulin: 2.8 g/dL (calc) (ref 1.9–3.7)
Glucose, Bld: 93 mg/dL (ref 65–99)
Potassium: 4.4 mmol/L (ref 3.5–5.3)
Sodium: 138 mmol/L (ref 135–146)
Total Bilirubin: 0.5 mg/dL (ref 0.2–1.2)
Total Protein: 6.5 g/dL (ref 6.1–8.1)
eGFR: 81 mL/min/{1.73_m2} (ref >=60)

## 2021-05-06 LAB — CBC WITH DIFFERENTIAL/PLATELET
Absolute Monocytes: 610 cells/uL (ref 200–950)
Basophils Absolute: 47 cells/uL (ref 0–200)
Basophils Relative: 0.7 %
Eosinophils Absolute: 168 cells/uL (ref 15–500)
Eosinophils Relative: 2.5 %
HCT: 45.5 % — ABNORMAL HIGH (ref 35.0–45.0)
Hemoglobin: 14.9 g/dL (ref 11.7–15.5)
Lymphs Abs: 2117 cells/uL (ref 850–3900)
MCH: 30 pg (ref 27.0–33.0)
MCHC: 32.7 g/dL (ref 32.0–36.0)
MCV: 91.5 fL (ref 80.0–100.0)
MPV: 11.6 fL (ref 7.5–12.5)
Monocytes Relative: 9.1 %
Neutro Abs: 3759 cells/uL (ref 1500–7800)
Neutrophils Relative %: 56.1 %
Platelets: 266 10*3/uL (ref 140–400)
RBC: 4.97 10*6/uL (ref 3.80–5.10)
RDW: 12.4 % (ref 11.0–15.0)
Total Lymphocyte: 31.6 %
WBC: 6.7 10*3/uL (ref 3.8–10.8)

## 2021-05-06 MED ORDER — ENBREL SURECLICK 50 MG/ML ~~LOC~~ SOAJ: 50.0000 mg | SUBCUTANEOUS | 0 refills | Status: DC

## 2021-05-06 NOTE — Patient Instructions (Signed)
Standing Labs We placed an order today for your standing lab work.   Please have your standing labs drawn in October and every 3 months  If possible, please have your labs drawn 2 weeks prior to your appointment so that the provider can discuss your results at your appointment.  Please note that you may see your imaging and lab results in [place] before we have reviewed them. We may be awaiting multiple results to interpret others before contacting you. Please allow our office up to 72 hours to thoroughly review all of the results before contacting the office for clarification of your results.  We have open lab daily: Monday through Thursday from 1:30-4:30 PM and Friday from 1:30-4:00 PM at the office of Dr. Bo Merino, [facility].   Please be advised, all patients with office appointments requiring lab work will take precedent over walk-in lab work.  If possible, please come for your lab work on Monday and Friday afternoons, as you may experience shorter wait times. The office is located at [address], [place], [place], [place] [address] No appointment is necessary.   Labs are drawn by [facility]. Please bring your co-pay at the time of your lab draw.  You may receive a bill from [facility] for your lab work.  If you wish to have your labs drawn at another location, please call the office 24 hours in advance to send orders.  If you have any questions regarding directions or hours of operation,  please call [phone].   As a reminder, please drink plenty of water prior to coming for your lab work. Thanks!   Vaccines You are taking a medication(s) that can suppress your immune system.  The following immunizations are recommended: Flu annually Covid-19  Td/Tdap (tetanus, diphtheria, pertussis) every 10 years Pneumonia (Prevnar 15 then Pneumovax 23 at least 1 year apart.  Alternatively, can take Prevnar 20 without needing additional dose) Shingrix (after age 44): 2  doses from 4 weeks to 6 months apart  Please check with your PCP to make sure you are up to date.   If you test POSITIVE for COVID19 and have MILD to MODERATE symptoms: First, call your PCP if you would like to receive COVID19 treatment AND Hold your medications during the infection and for at least 1 week after your symptoms have resolved: Injectable medication (Benlysta, Cimzia, Cosentyx, Enbrel, Humira, Orencia, Remicade, Simponi, Stelara, Taltz, Tremfya) Methotrexate Leflunomide (Arava) Mycophenolate (Cellcept) Morrie Sheldon, Olumiant, or Rinvoq If you take Actemra or Kevzara, you DO NOT need to hold these for COVID19 infection.  If you test POSITIVE for COVID19 and have NO symptoms: First, call your PCP if you would like to receive COVID19 treatment AND Hold your medications for at least 10 days after the day that you tested positive Injectable medication (Benlysta, Cimzia, Cosentyx, Enbrel, Humira, Orencia, Remicade, Simponi, Stelara, Taltz, Tremfya) Methotrexate Leflunomide (Arava) Mycophenolate (Cellcept) Morrie Sheldon, Olumiant, or Rinvoq If you take Actemra or Kevzara, you DO NOT need to hold these for COVID19 infection.  If you have signs or symptoms of an infection or start antibiotics: First, call your PCP for workup of your infection. Hold your medication through the infection, until you complete your antibiotics, and until symptoms resolve if you take the following: Injectable medication (Actemra, Benlysta, Cimzia, Cosentyx, Enbrel, Humira, Kevzara, Orencia, Remicade, Simponi, Stelara, Taltz, Tremfya) Methotrexate Leflunomide (Arava) Mycophenolate (Cellcept) Roma Kayser, or Rinvoq  Please a schedule an annual skin examination with a dermatologist to screen for nonmelanoma skin cancer  Heart Disease Prevention   Your inflammatory disease increases your risk of heart disease which includes heart attack, stroke, atrial fibrillation (irregular heartbeats), high blood pressure,  heart failure and atherosclerosis (plaque in the arteries).  It is important to reduce your risk by:   Keep blood pressure, cholesterol, and blood sugar at healthy levels   Smoking Cessation   Maintain a healthy weight  BMI 20-25   Eat a healthy diet  Plenty of fresh fruit, vegetables, and whole grains  Limit saturated fats, foods high in sodium, and added sugars  DASH and Mediterranean diet   Increase physical activity  Recommend moderate physically activity for 150 minutes per week/ 30 minutes a day for five days a week These can be broken up into three separate ten-minute sessions during the day.   Reduce Stress  Meditation, slow breathing exercises, yoga, coloring books  Dental visits twice a year

## 2021-05-12 ENCOUNTER — Telehealth: Payer: Self-pay | Admitting: Family Medicine

## 2021-05-12 NOTE — Chronic Care Management (AMB) (Signed)
  Chronic Care Management   Note  05/12/2021 Name: Samantha Valentine MRN: [id number] DOB: 08/24/1946  Samantha Valentine is a 75 y.o. year old female who is a primary care patient of Tower, Wynelle Fanny, MD. I reached out to Ranelle Oyster by phone today in response to a referral sent by Ms. Evern Bio Mcwhirter's PCP, Tower, Wynelle Fanny, MD.   Ms. Fetner was given information about Chronic Care Management services today including:  CCM service includes personalized support from designated clinical staff supervised by her physician, including individualized plan of care and coordination with other care providers 24/7 contact phone numbers for assistance for urgent and routine care needs. Service will only be billed when office clinical staff spend 20 minutes or more in a month to coordinate care. Only one practitioner may furnish and bill the service in a calendar month. The patient may stop CCM services at any time (effective at the end of the month) by phone call to the office staff.   Patient wishes to consider information provided and/or speak with a member of the care team before deciding about enrollment in care management services.   Follow up plan:   [place] Secretary/administrator

## 2021-06-19 DIAGNOSIS — H353132 Nonexudative age-related macular degeneration, bilateral, intermediate dry stage: Secondary | ICD-10-CM | POA: Diagnosis not present

## 2021-07-03 ENCOUNTER — Telehealth: Payer: Self-pay | Admitting: Family Medicine

## 2021-07-03 NOTE — Telephone Encounter (Signed)
LVM for pt to rtn my call to schedule AWV with [facility]. Please schedule AWV if pt calls the office.   Thanks

## 2021-08-15 ENCOUNTER — Other Ambulatory Visit: Payer: Self-pay | Admitting: *Deleted

## 2021-08-15 DIAGNOSIS — Z79899 Other long term (current) drug therapy: Secondary | ICD-10-CM | POA: Diagnosis not present

## 2021-08-17 ENCOUNTER — Other Ambulatory Visit: Payer: Self-pay | Admitting: Rheumatology

## 2021-08-17 LAB — QUANTIFERON-TB GOLD PLUS
Mitogen-NIL: 6.18 IU/mL
NIL: 0.03 IU/mL
QuantiFERON-TB Gold Plus: NEGATIVE
TB1-NIL: 0 IU/mL
TB2-NIL: 0 IU/mL

## 2021-08-17 LAB — CBC WITH DIFFERENTIAL/PLATELET
Absolute Monocytes: 653 cells/uL (ref 200–950)
Basophils Absolute: 51 cells/uL (ref 0–200)
Basophils Relative: 0.8 %
Eosinophils Absolute: 198 cells/uL (ref 15–500)
Eosinophils Relative: 3.1 %
HCT: 42.9 % (ref 35.0–45.0)
Hemoglobin: 14.1 g/dL (ref 11.7–15.5)
Lymphs Abs: 1862 cells/uL (ref 850–3900)
MCH: 30.1 pg (ref 27.0–33.0)
MCHC: 32.9 g/dL (ref 32.0–36.0)
MCV: 91.5 fL (ref 80.0–100.0)
MPV: 11.4 fL (ref 7.5–12.5)
Monocytes Relative: 10.2 %
Neutro Abs: 3635 cells/uL (ref 1500–7800)
Neutrophils Relative %: 56.8 %
Platelets: 259 10*3/uL (ref 140–400)
RBC: 4.69 10*6/uL (ref 3.80–5.10)
RDW: 12.3 % (ref 11.0–15.0)
Total Lymphocyte: 29.1 %
WBC: 6.4 10*3/uL (ref 3.8–10.8)

## 2021-08-17 LAB — COMPLETE METABOLIC PANEL WITH GFR
AG Ratio: 1.3 (calc) (ref 1.0–2.5)
ALT: 9 U/L (ref 6–29)
AST: 12 U/L (ref 10–35)
Albumin: 3.6 g/dL (ref 3.6–5.1)
Alkaline phosphatase (APISO): 84 U/L (ref 37–153)
BUN: 17 mg/dL (ref 7–25)
CO2: 25 mmol/L (ref 20–32)
Calcium: 9.1 mg/dL (ref 8.6–10.4)
Chloride: 106 mmol/L (ref 98–110)
Creat: 0.66 mg/dL (ref 0.60–1.00)
Globulin: 2.7 g/dL (calc) (ref 1.9–3.7)
Glucose, Bld: 98 mg/dL (ref 65–99)
Potassium: 4.5 mmol/L (ref 3.5–5.3)
Sodium: 140 mmol/L (ref 135–146)
Total Bilirubin: 0.3 mg/dL (ref 0.2–1.2)
Total Protein: 6.3 g/dL (ref 6.1–8.1)
eGFR: 93 mL/min/{1.73_m2} (ref >=60)

## 2021-08-17 NOTE — Progress Notes (Signed)
CBC and CMP are normal.  TB Gold is negative.

## 2021-08-18 ENCOUNTER — Telehealth: Payer: Self-pay | Admitting: Pharmacist

## 2021-08-18 ENCOUNTER — Other Ambulatory Visit: Payer: Self-pay | Admitting: *Deleted

## 2021-08-18 ENCOUNTER — Other Ambulatory Visit: Payer: Self-pay | Admitting: Rheumatology

## 2021-08-18 MED ORDER — ENBREL SURECLICK 50 MG/ML ~~LOC~~ SOAJ: 50.0000 mg | SUBCUTANEOUS | 0 refills | Status: DC

## 2021-08-18 NOTE — Telephone Encounter (Signed)
Next Visit: 10/07/2021  Last Visit: 05/06/2021  Last Fill: 05/06/2021  [id number] rheumatoid arthritis of multiple sites   Current Dose per office note 3/97/6734: Enbrel SureClick 50 mg every 7 days  Labs: 08/15/2021 CBC and CMP are normal.   TB Gold: 08/15/2021 Neg    Okay to refill Enbrel?

## 2021-08-18 NOTE — Telephone Encounter (Signed)
Patient dropped off renewal application from [employer] for ENBREL patient assistance. Signed patient portion, med list, and insurance card copy collected.    Pending provider signature - placed in folder to be signed   Knox Saliva, PharmD, MPH, BCPS Clinical Pharmacist (Rheumatology and Pulmonology)

## 2021-08-19 NOTE — Telephone Encounter (Signed)
Submitted Patient Assistance renewal Application to [employer] for ENBREL along with provider portion, patient portion, PA, med list, and insurance card copy. Will update patient when we receive a response.   Fax# [phone] Phone# [phone]   Knox Saliva, PharmD, MPH, BCPS Clinical Pharmacist (Rheumatology and Pulmonology)

## 2021-08-27 NOTE — Telephone Encounter (Signed)
Received a fax from  [place] regarding a re-approval for ENBREL patient assistance from 08/26/21 to 10/18/22.   Phone number: [phone]  Knox Saliva, PharmD, MPH, BCPS Clinical Pharmacist (Rheumatology and Pulmonology)

## 2021-09-07 ENCOUNTER — Other Ambulatory Visit: Payer: Self-pay | Admitting: Family Medicine

## 2021-09-09 NOTE — Telephone Encounter (Signed)
Med refill f/u was on 01/30/21, last fill on 01/30/21 #90 caps with 3 refill

## 2021-09-17 ENCOUNTER — Other Ambulatory Visit: Payer: Self-pay

## 2021-09-17 ENCOUNTER — Ambulatory Visit: Payer: PPO | Admitting: Rheumatology

## 2021-09-17 ENCOUNTER — Encounter: Payer: Self-pay | Admitting: Rheumatology

## 2021-09-17 VITALS — BP 151/86 | HR 87 | Resp 16 | Ht 64.0 in | Wt 177.0 lb

## 2021-09-17 DIAGNOSIS — M51369 Other intervertebral disc degeneration, lumbar region without mention of lumbar back pain or lower extremity pain: Secondary | ICD-10-CM

## 2021-09-17 DIAGNOSIS — M5134 Other intervertebral disc degeneration, thoracic region: Secondary | ICD-10-CM

## 2021-09-17 DIAGNOSIS — Z8659 Personal history of other mental and behavioral disorders: Secondary | ICD-10-CM

## 2021-09-17 DIAGNOSIS — C50412 Malignant neoplasm of upper-outer quadrant of left female breast: Secondary | ICD-10-CM | POA: Diagnosis not present

## 2021-09-17 DIAGNOSIS — Z8639 Personal history of other endocrine, nutritional and metabolic disease: Secondary | ICD-10-CM

## 2021-09-17 DIAGNOSIS — M19041 Primary osteoarthritis, right hand: Secondary | ICD-10-CM | POA: Diagnosis not present

## 2021-09-17 DIAGNOSIS — M5030 Other cervical disc degeneration, unspecified cervical region: Secondary | ICD-10-CM

## 2021-09-17 DIAGNOSIS — M5136 Other intervertebral disc degeneration, lumbar region: Secondary | ICD-10-CM

## 2021-09-17 DIAGNOSIS — M0579 Rheumatoid arthritis with rheumatoid factor of multiple sites without organ or systems involvement: Secondary | ICD-10-CM | POA: Diagnosis not present

## 2021-09-17 DIAGNOSIS — Z79899 Other long term (current) drug therapy: Secondary | ICD-10-CM | POA: Diagnosis not present

## 2021-09-17 DIAGNOSIS — M797 Fibromyalgia: Secondary | ICD-10-CM | POA: Diagnosis not present

## 2021-09-17 DIAGNOSIS — M810 Age-related osteoporosis without current pathological fracture: Secondary | ICD-10-CM

## 2021-09-17 DIAGNOSIS — Z96641 Presence of right artificial hip joint: Secondary | ICD-10-CM | POA: Diagnosis not present

## 2021-09-17 DIAGNOSIS — Z171 Estrogen receptor negative status [ER-]: Secondary | ICD-10-CM

## 2021-09-17 DIAGNOSIS — Z8669 Personal history of other diseases of the nervous system and sense organs: Secondary | ICD-10-CM

## 2021-09-17 DIAGNOSIS — M19042 Primary osteoarthritis, left hand: Secondary | ICD-10-CM

## 2021-09-17 MED ORDER — PREDNISONE 5 MG PO TABS: ORAL_TABLET | ORAL | 0 refills | Status: DC

## 2021-09-17 NOTE — Telephone Encounter (Signed)
Spoke with patient and scheduled a visit today at 1:15 pm.

## 2021-09-17 NOTE — Progress Notes (Signed)
Office Visit Note  Patient: Samantha Valentine             Date of Birth: Feb 04, 1946           MRN: [id number]             PCP: Abner Greenspan, MD Referring: Tower, Wynelle Fanny, MD Visit Date: 09/17/2021 Occupation: @GUAROCC @  Subjective:  Rheumatoid Arthritis (Bil hand pain and swelling)   History of Present Illness: Samantha Valentine is a 75 y.o. female with a history of rheumatoid arthritis and osteoarthritis.  She states her son brother passed away in April 14, 2021.  She had been under a lot of stress.  She was also cleaning out his home.  Its been stressful on her joints.  She has been having pain and swelling in her bilateral hands and wrists.  She states her feet and ankles were swollen as well.  The swelling in her feet and ankles has subsided.  She has been having increasing stiffness in her neck as well.  Activities of Daily Living:  Patient reports morning stiffness for 24 hours.   Patient Reports nocturnal pain.  Difficulty dressing/grooming: Reports Difficulty climbing stairs: Reports Difficulty getting out of chair: Reports Difficulty using hands for taps, buttons, cutlery, and/or writing: Reports  Review of Systems  Constitutional:  Positive for fatigue.  HENT:  Positive for mouth dryness.   Eyes:  Positive for dryness.  Respiratory:  Negative for difficulty breathing.   Cardiovascular:  Positive for swelling in legs/feet.  Gastrointestinal:  Negative for constipation.  Endocrine: Negative for excessive thirst.  Genitourinary:  Negative for difficulty urinating.  Musculoskeletal:  Positive for joint pain, gait problem, joint pain, joint swelling, muscle weakness, morning stiffness and muscle tenderness.  Skin:  Negative for color change, rash and sensitivity to sunlight.  Allergic/Immunologic: Negative for susceptible to infections.  Neurological:  Negative for weakness.  Hematological:  Negative for bruising/bleeding tendency and swollen glands.   Psychiatric/Behavioral:  Positive for depressed mood and sleep disturbance. The patient is not nervous/anxious.    PMFS History:  Patient Active Problem List   Diagnosis Date Noted   Elevated glucose level 11/23/2018   Routine general medical examination at a health care facility 08/09/2018   DDD (degenerative disc disease), thoracolumbar 06/28/2018   DDD (degenerative disc disease), cervical 06/28/2018   Atherosclerosis of aorta ([place]) 03/11/2018   Sleep disturbance 06/18/2017   Tachycardia 05/25/2017   Dysuria 05/25/2017   Estrogen deficiency 03/22/2017   Malignant neoplasm of upper-outer quadrant of left breast in female, estrogen receptor negative ([place]) 02/04/2017   Tendinopathy of right shoulder 01/25/2017   High risk medication use 09/29/2016   DJD (degenerative joint disease), cervical 09/29/2016   History of right hip replacement 09/29/2016   Eczema 07/13/2014   Adverse effect of immunosuppressive drug 04/27/2012   ANXIETY DEPRESSION 10/28/2008   Spinal stenosis 12/29/2007   Hypothyroidism 12/28/2007   Vitamin D deficiency 12/28/2007   HEARING LOSS 12/28/2007   Seropositive rheumatoid arthritis of multiple sites ([place]) 12/28/2007   DDD (degenerative disc disease), lumbar 12/28/2007   Fibromyalgia 12/28/2007   Osteoporosis 12/28/2007   MIGRAINES, HX OF 12/28/2007    Past Medical History:  Diagnosis Date   Anxiety    Arthritis    RA   Breast cancer ([place]) 01/29/2017   Breast mass 1 year    Cancer ([place]) 01/29/2017   left breast INVASIVE MAMMARY CARCINOMA, ER/PR positive   Cataract 2019   resolved with surgery   Collagen  vascular disease ([place])    Rhematoid Arthritis   Complication of anesthesia    DDD (degenerative disc disease)    in neck   Depression    Dyspnea    Edema    FEET/LEGS   Emphysema of lung ([facility])    Fibromyalgia    GERD (gastroesophageal reflux disease)    NO MEDS   History of hiatal hernia    History of kidney stones    MULTIPLE KIDNEY  STONES BIL   HOH (hard of hearing)    AIDS   Hypothyroidism    Macular degeneration, bilateral    Migraine    Osteoporosis    Palpitations    Personal history of chemotherapy    prior to mastectomy   PONV (postoperative nausea and vomiting)    AFTER FIRST CATARACT   Rheumatoid arthritis ([place])     Family History  Problem Relation Age of Onset   Hypertension Mother    Endometrial cancer Mother    Osteoarthritis Mother    Alcohol abuse Father    Diabetes Father    Stroke Father    Liver disease Sister    Breast cancer Maternal Aunt    Breast cancer Paternal Aunt 39   Rheum arthritis Maternal Grandmother    Diabetes Paternal Grandmother    Parkinson's disease Paternal Grandfather    Past Surgical History:  Procedure Laterality Date   BREAST BIOPSY Left 01/29/2017   US guided biopsy INVASIVE MAMMARY CARCINOMA   CATARACT EXTRACTION W/PHACO Left 04/28/2018   Procedure: CATARACT EXTRACTION PHACO AND INTRAOCULAR LENS PLACEMENT ([place]);  Surgeon: Leandrew Koyanagi, MD;  Location: [facility] ORS;  Service: Ophthalmology;  Laterality: Left;  Lot # [id number]   1:03 AP%   13.4 CDE    8.40   CATARACT EXTRACTION W/PHACO Right 06/09/2018   Procedure: CATARACT EXTRACTION PHACO AND INTRAOCULAR LENS PLACEMENT ([facility]);  Surgeon: Leandrew Koyanagi, MD;  Location: [facility];  Service: Ophthalmology;  Laterality: Right;  lot# [id number] [place] 0:55 ap 16.3% cde 8.21   HIP ARTHROPLASTY     JOINT REPLACEMENT Right 2003   total hip replacement   LITHOTRIPSY  1997   kidney stone   MASTECTOMY Left 01/29/2017   MASTECTOMY W/ SENTINEL NODE BIOPSY Left 05/18/2017   Procedure: MASTECTOMY WITH SENTINEL LYMPH NODE BIOPSY;  Surgeon: Robert Bellow, MD;  Location: [facility] OR[facility];  Service: General;  Laterality: Left;   PORT-A-CATH REMOVAL  09/2017   PORTACATH PLACEMENT Right 02/15/2017   Procedure: INSERTION PORT-A-CATH;  Surgeon: Robert Bellow, MD;  Location: [facility] OR[facility];  Service: General;  Laterality: Right;    TONSILLECTOMY  1970   Social History   Social History Narrative   Not on file   Immunization History  Administered Date(s) Administered   Influenza, High Dose Seasonal PF 07/28/2017, 07/17/2018, 07/26/2019   Influenza,inj,Quad PF,6+ Mos 07/05/2013, 07/13/2014, 08/20/2015   Influenza-Unspecified 07/19/2016, 07/28/2017, 07/17/2018   PFIZER(Purple Top)SARS-COV-2 Vaccination 10/30/2019, 11/18/2019, 06/14/2020   Pneumococcal Conjugate-13 08/20/2015   Pneumococcal Polysaccharide-23 11/03/2006, 07/05/2013   Td 05/03/2002     Objective: Vital Signs: BP (!) 151/86 (BP Location: Right Arm, Patient Position: Sitting, Cuff Size: Normal)   Pulse 87   Resp 16   Ht 5\' 4"  (1.626 m)   Wt 177 lb (80.3 kg)   BMI 30.38 kg/m    Physical Exam Vitals and nursing note reviewed.  Constitutional:      Appearance: She is well-developed.  HENT:     Head: Normocephalic and atraumatic.  Eyes:  Conjunctiva/sclera: Conjunctivae normal.  Cardiovascular:     Rate and Rhythm: Normal rate and regular rhythm.     Heart sounds: Normal heart sounds.  Pulmonary:     Effort: Pulmonary effort is normal.     Breath sounds: Normal breath sounds.  Abdominal:     General: Bowel sounds are normal.     Palpations: Abdomen is soft.  Musculoskeletal:     Cervical back: Normal range of motion.  Lymphadenopathy:     Cervical: No cervical adenopathy.  Skin:    General: Skin is warm and dry.     Capillary Refill: Capillary refill takes less than 2 seconds.  Neurological:     Mental Status: She is alert and oriented to person, place, and time.  Psychiatric:        Behavior: Behavior normal.     Musculoskeletal Exam: She had painful limited range of motion of her cervical spine especially with the left lateral rotation.  Shoulder joints, elbow joints with good range of motion.  She had synovitis over bilateral wrist joints, bilateral MCPs and PIPs as described below.  Hip joints and knee joints with good range  of motion.  She had no tenderness over ankles or MTPs.  CDAI Exam: CDAI Score: 19.6  Patient Global: 8 mm; Provider Global: 8 mm Swollen: 9 ; Tender: 10  Joint Exam 09/17/2021      Right  Left  Wrist  Swollen Tender  Swollen Tender  MCP 2  Swollen Tender     MCP 5  Swollen Tender     PIP 3  Swollen Tender  Swollen Tender  PIP 4  Swollen Tender  Swollen Tender  PIP 5  Swollen Tender     Cervical Spine   Tender        Investigation: No additional findings.  Imaging: No results found.  Recent Labs: Lab Results  Component Value Date   WBC 6.4 08/15/2021   HGB 14.1 08/15/2021   PLT 259 08/15/2021   NA 140 08/15/2021   K 4.5 08/15/2021   CL 106 08/15/2021   CO2 25 08/15/2021   GLUCOSE 98 08/15/2021   BUN 17 08/15/2021   CREATININE 0.66 08/15/2021   BILITOT 0.3 08/15/2021   ALKPHOS 85 01/30/2021   AST 12 08/15/2021   ALT 9 08/15/2021   PROT 6.3 08/15/2021   ALBUMIN 3.6 01/30/2021   CALCIUM 9.1 08/15/2021   GFRAA 101 11/06/2020   QFTBGOLD NEGATIVE 09/03/2017   QFTBGOLDPLUS NEGATIVE 08/15/2021    Speciality Comments: No specialty comments available.  Procedures:  No procedures performed Allergies: Adhesive [tape], Cymbalta [duloxetine hcl], Hydroxychloroquine sulfate, Ibandronate sodium, and Risedronate sodium   Assessment / Plan:     Visit Diagnoses: Seropositive rheumatoid arthritis of multiple sites ([place]) - Positive RF with erosive disease: She is having a severe flare with pain and swelling in multiple joints.  She is having difficulty making a fist.  She has synovitis involving bilateral wrist joints and bilateral hands.  She has been under a lot of stress because of the loss of her brother.  She was also organizing her home which puts a lot of stress on her hands.  She states she has been taking Enbrel on a weekly basis.  We had detailed discussion regarding the treatment options.  She has taken methotrexate for several years in the past which was stopped as she  clinically improved.  I discussed adding methotrexate to Enbrel but she declined.  She would like to give it more  time.  After indications side effects contraindications were discussed she was given a prescription for prednisone taper starting at 20 mg and tapering by 5 mg every week until she reaches 2.5 mg and then she will discontinue it.  We also discussed possibly switching her from Enbrel to some other biologic agent if she continues to have pain and swelling.  High risk medication use - Enbrel SureClick 50 mg every 7 days. (discontinued SSZ in the past due to constipation).  She also took methotrexate in the past.  Labs obtained on August 15, 2021 showed normal CBC and CMP with GFR.  We will continue to monitor labs every 3 months.  She was advised to stop Enbrel in case she develops an infection and restart after the infection resolves.  Updated information about immunization was also placed in the AVS.  Primary osteoarthritis of both hands-she has osteoarthritis in her bilateral hands.  History of right hip replacement-doing well.  DDD (degenerative disc disease), cervical-she had painful limited range of motion especially with left lateral rotation.  DDD (degenerative disc disease), thoracic - gabapentin 100 mg 1 to 2 capsules by mouth at bedtime which manages her pain.  DDD (degenerative disc disease), lumbar-chronic discomfort.  Fibromyalgia -she is on gabapentin 100 mg 1 to 2 capsules by mouth at bedtime and Celebrex 200 mg 1 capsule by mouth twice daily PRN for pain relief.  Age-related osteoporosis without current pathological fracture - DEXA scan on 05/06/17 showed T score of -2.4.  She has been treated with Forteo and Prolia in the past.  Malignant neoplasm of upper-outer quadrant of left breast in female, estrogen receptor negative ([place])  History of vitamin D deficiency  History of depression  History of hypothyroidism  History of hearing loss  History of  migraine  History of anxiety  Orders: No orders of the defined types were placed in this encounter.  Meds ordered this encounter  Medications   predniSONE (DELTASONE) 5 MG tablet    Sig: Take 4 tabs po qd x 7 days, 3  tabs po qd x 7 days, 2  tabs po qd x 7 days, 1  tab po qd x 7 days, 1/2 tab po qd x 7 days.    Dispense:  74 tablet    Refill:  0      Follow-Up Instructions: Return in about 2 months (around 11/17/2021) for Rheumatoid arthritis, Osteoarthritis.   Bo Merino, MD  Note - This record has been created using Editor, commissioning.  Chart creation errors have been sought, but may not always  have been located. Such creation errors do not reflect on  the standard of medical care.

## 2021-09-17 NOTE — Patient Instructions (Signed)
Standing Labs We placed an order today for your standing lab work.   Please have your standing labs drawn in January and every 3 months  If possible, please have your labs drawn 2 weeks prior to your appointment so that the provider can discuss your results at your appointment.  Please note that you may see your imaging and lab results in MyChart before we have reviewed them. We may be awaiting multiple results to interpret others before contacting you. Please allow our office up to 72 hours to thoroughly review all of the results before contacting the office for clarification of your results.  We have open lab daily: Monday through Thursday from 1:30-4:30 PM and Friday from 1:30-4:00 PM at the office of Dr. Brode Sculley, [facility].   Please be advised, all patients with office appointments requiring lab work will take precedent over walk-in lab work.  If possible, please come for your lab work on Monday and Friday afternoons, as you may experience shorter wait times. The office is located at [address], [address], [place], [place] [address] No appointment is necessary.   Labs are drawn by [facility]. Please bring your co-pay at the time of your lab draw.  You may receive a bill from [facility] for your lab work.  If you wish to have your labs drawn at another location, please call the office 24 hours in advance to send orders.  If you have any questions regarding directions or hours of operation,  please call [phone].   As a reminder, please drink plenty of water prior to coming for your lab work. Thanks!   Vaccines You are taking a medication(s) that can suppress your immune system.  The following immunizations are recommended: Flu annually Covid-19  Td/Tdap (tetanus, diphtheria, pertussis) every 10 years Pneumonia (Prevnar 15 then Pneumovax 23 at least 1 year apart.  Alternatively, can take Prevnar 20 without needing additional dose) Shingrix: 2 doses from 4 weeks  to 6 months apart  Please check with your PCP to make sure you are up to date.   If you have signs or symptoms of an infection or start antibiotics: First, call your PCP for workup of your infection. Hold your medication through the infection, until you complete your antibiotics, and until symptoms resolve if you take the following: Injectable medication (Actemra, Benlysta, Cimzia, Cosentyx, Enbrel, Humira, Kevzara, Orencia, Remicade, Simponi, Stelara, Taltz, Tremfya) Methotrexate Leflunomide (Arava) Mycophenolate (Cellcept) Xeljanz, Olumiant, or Rinvoq  

## 2021-10-07 ENCOUNTER — Ambulatory Visit: Payer: PPO | Admitting: Physician Assistant

## 2021-11-12 ENCOUNTER — Other Ambulatory Visit: Payer: Self-pay | Admitting: Physician Assistant

## 2021-11-12 NOTE — Telephone Encounter (Signed)
Next Visit: 11/27/2021  Last Visit: 09/17/2022  Last Fill: 11/06/2020  DX: Fibromyalgia  Current Dose per office note 09/17/2021: Celebrex 200 mg 1 capsule by mouth twice daily PRN for pain relief.  Labs: 08/15/2021 CBC and CMP are normal  Okay to refill Celebrex?

## 2021-11-13 ENCOUNTER — Other Ambulatory Visit: Payer: Self-pay | Admitting: *Deleted

## 2021-11-13 DIAGNOSIS — Z79899 Other long term (current) drug therapy: Secondary | ICD-10-CM

## 2021-11-13 LAB — COMPLETE METABOLIC PANEL WITH GFR
AG Ratio: 1.4 (calc) (ref 1.0–2.5)
ALT: 13 U/L (ref 6–29)
AST: 20 U/L (ref 10–35)
Albumin: 3.4 g/dL — ABNORMAL LOW (ref 3.6–5.1)
Alkaline phosphatase (APISO): 76 U/L (ref 37–153)
BUN: 13 mg/dL (ref 7–25)
CO2: 29 mmol/L (ref 20–32)
Calcium: 9.1 mg/dL (ref 8.6–10.4)
Chloride: 108 mmol/L (ref 98–110)
Creat: 0.68 mg/dL (ref 0.60–1.00)
Globulin: 2.5 g/dL (calc) (ref 1.9–3.7)
Glucose, Bld: 105 mg/dL — ABNORMAL HIGH (ref 65–99)
Potassium: 4.8 mmol/L (ref 3.5–5.3)
Sodium: 142 mmol/L (ref 135–146)
Total Bilirubin: 0.3 mg/dL (ref 0.2–1.2)
Total Protein: 5.9 g/dL — ABNORMAL LOW (ref 6.1–8.1)
eGFR: 91 mL/min/{1.73_m2} (ref >=60)

## 2021-11-13 LAB — CBC WITH DIFFERENTIAL/PLATELET
Absolute Monocytes: 710 cells/uL (ref 200–950)
Basophils Absolute: 47 cells/uL (ref 0–200)
Basophils Relative: 0.7 %
Eosinophils Absolute: 228 cells/uL (ref 15–500)
Eosinophils Relative: 3.4 %
HCT: 41.3 % (ref 35.0–45.0)
Hemoglobin: 13.5 g/dL (ref 11.7–15.5)
Lymphs Abs: 1983 cells/uL (ref 850–3900)
MCH: 29.7 pg (ref 27.0–33.0)
MCHC: 32.7 g/dL (ref 32.0–36.0)
MCV: 90.8 fL (ref 80.0–100.0)
MPV: 11.7 fL (ref 7.5–12.5)
Monocytes Relative: 10.6 %
Neutro Abs: 3732 cells/uL (ref 1500–7800)
Neutrophils Relative %: 55.7 %
Platelets: 238 10*3/uL (ref 140–400)
RBC: 4.55 10*6/uL (ref 3.80–5.10)
RDW: 12.2 % (ref 11.0–15.0)
Total Lymphocyte: 29.6 %
WBC: 6.7 10*3/uL (ref 3.8–10.8)

## 2021-11-13 NOTE — Progress Notes (Signed)
Office Visit Note  Patient: Samantha Valentine             Date of Birth: 1946-05-26           MRN: [id number]             PCP: Abner Greenspan, MD Referring: Tower, Wynelle Fanny, MD Visit Date: 11/27/2021 Occupation: @GUAROCC @  Subjective:  Pain in multiple joints   History of Present Illness: Samantha Valentine is a 76 y.o. female history of seropositive rheumatoid arthritis, DDD, and fibromyalgia.  Patient remains on Enbrel 50 mg subcutaneous injections once weekly.  She was given a prednisone taper at her last office visit on 09/17/2021 for a flare.  Her symptoms improved while taking the prednisone taper but she did experience some side effects of prednisone such as increased constipation.  At her last office visit she declined restarting on methotrexate in hopes that her symptoms would improve after taking a prednisone taper.  She continues to have pain and swelling in both hands and the right wrist.  The pain in her right wrist has been severe at times up to a 10 out of 10 pain.  Her joint stiffness has been lasting all day.  She has been having to take Celebrex more frequently due to the pain.  She typically takes Celebrex only as needed but lately has been having to take it once daily. She would like to discuss restarting on methotrexate.    Activities of Daily Living:  Patient reports joint stiffness all day  Patient Reports nocturnal pain.  Difficulty dressing/grooming: Denies Difficulty climbing stairs: Reports Difficulty getting out of chair: Reports Difficulty using hands for taps, buttons, cutlery, and/or writing: Reports  Review of Systems  Constitutional:  Positive for fatigue.  HENT:  Positive for mouth dryness. Negative for mouth sores and nose dryness.   Eyes:  Positive for dryness. Negative for pain and visual disturbance.  Respiratory:  Negative for cough, hemoptysis, shortness of breath and difficulty breathing.   Cardiovascular:  Negative for chest pain,  palpitations, hypertension and swelling in legs/feet.  Gastrointestinal:  Negative for blood in stool, constipation and diarrhea.  Endocrine: Negative for increased urination.  Genitourinary:  Negative for painful urination.  Musculoskeletal:  Positive for joint pain, joint pain, joint swelling and morning stiffness. Negative for myalgias, muscle weakness, muscle tenderness and myalgias.  Skin:  Negative for color change, pallor, rash, hair loss, nodules/bumps, skin tightness, ulcers and sensitivity to sunlight.  Allergic/Immunologic: Negative for susceptible to infections.  Neurological:  Negative for dizziness, numbness, headaches and weakness.  Hematological:  Negative for swollen glands.  Psychiatric/Behavioral:  Positive for sleep disturbance. Negative for depressed mood. The patient is not nervous/anxious.    PMFS History:  Patient Active Problem List   Diagnosis Date Noted   Elevated glucose level 11/23/2018   Routine general medical examination at a health care facility 08/09/2018   DDD (degenerative disc disease), thoracolumbar 06/28/2018   DDD (degenerative disc disease), cervical 06/28/2018   Atherosclerosis of aorta ([place]) 03/11/2018   Sleep disturbance 06/18/2017   Tachycardia 05/25/2017   Dysuria 05/25/2017   Estrogen deficiency 03/22/2017   Malignant neoplasm of upper-outer quadrant of left breast in female, estrogen receptor negative ([place]) 02/04/2017   Tendinopathy of right shoulder 01/25/2017   High risk medication use 09/29/2016   DJD (degenerative joint disease), cervical 09/29/2016   History of right hip replacement 09/29/2016   Eczema 07/13/2014   Adverse effect of immunosuppressive drug 04/27/2012   ANXIETY  DEPRESSION 10/28/2008   Spinal stenosis 12/29/2007   Hypothyroidism 12/28/2007   Vitamin D deficiency 12/28/2007   HEARING LOSS 12/28/2007   Seropositive rheumatoid arthritis of multiple sites ([place]) 12/28/2007   DDD (degenerative disc disease), lumbar  12/28/2007   Fibromyalgia 12/28/2007   Osteoporosis 12/28/2007   MIGRAINES, HX OF 12/28/2007    Past Medical History:  Diagnosis Date   Anxiety    Arthritis    RA   Breast cancer ([place]) 01/29/2017   Breast mass 1 year    Cancer ([place]) 01/29/2017   left breast INVASIVE MAMMARY CARCINOMA, ER/PR positive   Cataract 2019   resolved with surgery   Collagen vascular disease ([place])    Rhematoid Arthritis   Complication of anesthesia    DDD (degenerative disc disease)    in neck   Depression    Dyspnea    Edema    FEET/LEGS   Emphysema of lung ([facility])    Fibromyalgia    GERD (gastroesophageal reflux disease)    NO MEDS   History of hiatal hernia    History of kidney stones    MULTIPLE KIDNEY STONES BIL   HOH (hard of hearing)    AIDS   Hypothyroidism    Macular degeneration, bilateral    Migraine    Osteoporosis    Palpitations    Personal history of chemotherapy    prior to mastectomy   PONV (postoperative nausea and vomiting)    AFTER FIRST CATARACT   Rheumatoid arthritis ([place])     Family History  Problem Relation Age of Onset   Hypertension Mother    Endometrial cancer Mother    Osteoarthritis Mother    Alcohol abuse Father    Diabetes Father    Stroke Father    Liver disease Sister    Breast cancer Maternal Aunt    Breast cancer Paternal Aunt 16   Rheum arthritis Maternal Grandmother    Diabetes Paternal Grandmother    Parkinson's disease Paternal Grandfather    Past Surgical History:  Procedure Laterality Date   BREAST BIOPSY Left 01/29/2017   US guided biopsy INVASIVE MAMMARY CARCINOMA   CATARACT EXTRACTION W/PHACO Left 04/28/2018   Procedure: CATARACT EXTRACTION PHACO AND INTRAOCULAR LENS PLACEMENT ([place]);  Surgeon: Leandrew Koyanagi, MD;  Location: [facility];  Service: Ophthalmology;  Laterality: Left;  Lot # [id number]   1:03 AP%   13.4 CDE    8.40   CATARACT EXTRACTION W/PHACO Right 06/09/2018   Procedure: CATARACT EXTRACTION PHACO AND INTRAOCULAR  LENS PLACEMENT ([facility]);  Surgeon: Leandrew Koyanagi, MD;  Location: [facility];  Service: Ophthalmology;  Laterality: Right;  lot# [id number] 0:55 ap 16.3% cde 8.21   HIP ARTHROPLASTY     JOINT REPLACEMENT Right 2003   total hip replacement   LITHOTRIPSY  1997   kidney stone   MASTECTOMY Left 01/29/2017   MASTECTOMY W/ SENTINEL NODE BIOPSY Left 05/18/2017   Procedure: MASTECTOMY WITH SENTINEL LYMPH NODE BIOPSY;  Surgeon: Robert Bellow, MD;  Location: [place] [facility];  Service: General;  Laterality: Left;   PORT-A-CATH REMOVAL  09/2017   PORTACATH PLACEMENT Right 02/15/2017   Procedure: INSERTION PORT-A-CATH;  Surgeon: Robert Bellow, MD;  Location: [facility];  Service: General;  Laterality: Right;   TONSILLECTOMY  1970   Social History   Social History Narrative   Not on file   Immunization History  Administered Date(s) Administered   Influenza, High Dose Seasonal PF 07/28/2017, 07/17/2018, 07/26/2019   Influenza,inj,Quad PF,6+ Mos 07/05/2013, 07/13/2014,  08/20/2015   Influenza-Unspecified 07/19/2016, 07/28/2017, 07/17/2018   PFIZER(Purple Top)SARS-COV-2 Vaccination 10/30/2019, 11/18/2019, 06/14/2020   Pneumococcal Conjugate-13 08/20/2015   Pneumococcal Polysaccharide-23 11/03/2006, 07/05/2013   Td 05/03/2002     Objective: Vital Signs: BP 132/81 (BP Location: Right Arm, Patient Position: Sitting, Cuff Size: Large)    Pulse 83    Resp 13    Ht 5\' 4"  (1.626 m)    Wt 179 lb 6.4 oz (81.4 kg)    BMI 30.79 kg/m    Physical Exam Vitals and nursing note reviewed.  Constitutional:      Appearance: She is well-developed.  HENT:     Head: Normocephalic and atraumatic.  Eyes:     Conjunctiva/sclera: Conjunctivae normal.  Cardiovascular:     Rate and Rhythm: Normal rate and regular rhythm.     Heart sounds: Normal heart sounds.  Pulmonary:     Effort: Pulmonary effort is normal.     Breath sounds: Normal breath sounds.  Abdominal:     General: Bowel sounds are normal.      Palpations: Abdomen is soft.  Musculoskeletal:     Cervical back: Normal range of motion.  Skin:    General: Skin is warm and dry.     Capillary Refill: Capillary refill takes less than 2 seconds.  Neurological:     Mental Status: She is alert and oriented to person, place, and time.  Psychiatric:        Behavior: Behavior normal.     Musculoskeletal Exam: C-spine has slightly limited ROM.  Thoracic kyphosis noted.  Shoulder joints and elbow joints have good range of motion with no discomfort.  Rheumatoid nodules palpable on extensor surface of both elbows.  Limited extension of the right wrist with tenderness and synovitis.  Right CMC joint thickening and tenderness noted.  Some tenderness on the ulnar aspect of the left wrist noted.  Tenderness and synovitis of the right second and fifth MCPs and the right third, fourth, and fifth PIP joints.  PIP and DIP thickening consistent with osteoarthritis of both hands.  Difficulty making complete fist bilaterally.  Hip joints have good range of motion with no groin pain.  Knee joints have good range of motion with no warmth or effusion.  Ankle joints have good range of motion with no joint tenderness.  Pedal edema noted bilaterally.  CDAI Exam: CDAI Score: 14.4  Patient Global: 7 mm; Provider Global: 7 mm Swollen: 6 ; Tender: 7  Joint Exam 11/27/2021      Right  Left  Wrist  Swollen Tender   Tender  MCP 2  Swollen Tender     MCP 5  Swollen Tender     PIP 3  Swollen Tender     PIP 4  Swollen Tender     PIP 5  Swollen Tender        Investigation: No additional findings.  Imaging: No results found.  Recent Labs: Lab Results  Component Value Date   WBC 6.7 11/13/2021   HGB 13.5 11/13/2021   PLT 238 11/13/2021   NA 142 11/13/2021   K 4.8 11/13/2021   CL 108 11/13/2021   CO2 29 11/13/2021   GLUCOSE 105 (H) 11/13/2021   BUN 13 11/13/2021   CREATININE 0.68 11/13/2021   BILITOT 0.3 11/13/2021   ALKPHOS 85 01/30/2021   AST 20  11/13/2021   ALT 13 11/13/2021   PROT 5.9 (L) 11/13/2021   ALBUMIN 3.6 01/30/2021   CALCIUM 9.1 11/13/2021   GFRAA 101  11/06/2020   QFTBGOLD NEGATIVE 09/03/2017   QFTBGOLDPLUS NEGATIVE 08/15/2021    Speciality Comments: No specialty comments available.  Procedures:  No procedures performed Allergies: Adhesive [tape], Cymbalta [duloxetine hcl], Hydroxychloroquine sulfate, Ibandronate sodium, and Risedronate sodium   Assessment / Plan:     Visit Diagnoses: Seropositive rheumatoid arthritis of multiple sites ([place]) - Positive RF with erosive disease: She presents today with ongoing tenderness and synovitis involving multiple joints.  Her pain has been most severe in the right wrist.  The right wrist has limited extension on examination today with tenderness and synovitis.  She has tenderness over several MCP and PIP joints as described above.  She has been experiencing nocturnal pain as well as joint stiffness lasting all day.  She was prescribed a prednisone taper at her last office visit on 09/17/2021 which provided temporary relief.  She declined restarting on methotrexate at her last office visit.  She is open to restarting methotrexate at this time.  Indications, contraindications, potential side effects of methotrexate were discussed today in detail.  All questions were addressed and consent was obtained.  A new prescription for methotrexate and folic acid will be sent to the pharmacy today.  She will return for lab work in 2 weeks to monitor for drug toxicity.  Discussed the importance of avoiding the use of Celebrex while taking methotrexate.  She will remain on Enbrel as prescribed.  She will start on methotrexate 6 tablets by mouth once weekly x2 weeks and if labs are stable she will increase to 8 tablets weekly.  She will take folic acid 2 mg daily.  She was advised to notify us if she cannot tolerate taking methotrexate or if she does not notice any clinical improvement.  She will follow-up  in the office in 6 weeks.  Drug Counseling TB Gold: Negative on 08/15/21  Hepatitis panel: Future orders placed today.   Chest-xray:  no active cardiopulmonary disease on 05/25/17 Contraception: Postmenopausal  Alcohol use: Discussed the importance of avoiding alcohol use.  She does not drink alcohol.   Patient was counseled on the purpose, proper use, and adverse effects of methotrexate including nausea, infection, and signs and symptoms of pneumonitis.  Reviewed instructions with patient to take methotrexate weekly along with folic acid daily.  Discussed the importance of frequent monitoring of kidney and liver function and blood counts, and provided patient with standing lab instructions.  Counseled patient to avoid NSAIDs and alcohol while on methotrexate.  Provided patient with educational materials on methotrexate and answered all questions.  Advised patient to get annual influenza vaccine and to get a pneumococcal vaccine if patient has not already had one.  Patient voiced understanding.  Patient consented to methotrexate use.  Will upload into chart.    High risk medication use - Enbrel SureClick 50 mg every 7 days. (discontinued SSZ in the past due to constipation).  She will be restarting on methotrexate as discussed above.  She will start on methotrexate 6 tablets by mouth once weekly and if labs are stable she will increase to 8 tablets once weekly.  She will take folic acid 2 mg daily. CBC and CMP updated on 11/13/2021.  She will be due to update lab work in April and every 3 months to monitor for drug toxicity.  Standing orders for CBC and CMP remain in place.  TB Gold negative on 08/15/2021. She has not had any recent infections.  Discussed the importance of holding Enbrel and methotrexate if she develops signs or  symptoms of infection and to resume once the infection has completely cleared.  She voiced understanding.  Primary osteoarthritis of both hands: She has PIP and DIP thickening  consistent with osteoarthritis of both hands.  She is experiencing right CMC joint tenderness and has thickening.  Discussed the importance of joint protection and muscle strengthening.  History of right hip replacement: She has good ROM of the right hip replacement on examination.   DDD (degenerative disc disease), cervical: Limited range of motion of the C-spine on examination today.  DDD (degenerative disc disease), thoracic: Thoracic kyphosis was noted.  No midline spinal tenderness noted.  DDD (degenerative disc disease), lumbar: She has occasional discomfort in her lower back.  She has been experiencing joint stiffness all day.  She has not had any symptoms of radiculopathy.  Fibromyalgia - She continues to experience intermittent myalgias and muscle tenderness due to fibromyalgia.  She takes gabapentin 100 mg 1 to 2 capsules by mouth at bedtime and Celebrex 200 mg 1 capsule by mouth twice daily PRN for pain relief.  Age-related osteoporosis without current pathological fracture - DEXA scan on 05/06/17 showed T score of -2.4.  She has been treated with Forteo and Prolia in the past.  Other medical conditions are listed as follows:  Malignant neoplasm of upper-outer quadrant of left breast in female, estrogen receptor negative ([place])  History of vitamin D deficiency  History of depression  History of hypothyroidism  History of hearing loss  History of migraine  History of anxiety  Orders: Orders Placed This Encounter  Procedures   Hepatitis B surface antigen   Hepatitis C antibody   Hepatitis B core antibody, IgM   Meds ordered this encounter  Medications   methotrexate (RHEUMATREX) 2.5 MG tablet    Sig: Take 6 tabs po once weekly for 2 weeks, if labs are stable, increase to 8 tabs po once weekly. Caution:Chemotherapy. Protect from light.    Dispense:  28 tablet    Refill:  0   folic acid (FOLVITE) 1 MG tablet    Sig: Take 2 tablets (2 mg total) by mouth daily.     Dispense:  180 tablet    Refill:  3      Follow-Up Instructions: Return in about 6 weeks (around 01/08/2022) for Rheumatoid arthritis, DDD, Fibromyalgia.   Ofilia Neas, PA-C  Note - This record has been created using Dragon software.  Chart creation errors have been sought, but may not always  have been located. Such creation errors do not reflect on  the standard of medical care.

## 2021-11-19 ENCOUNTER — Other Ambulatory Visit: Payer: Self-pay | Admitting: *Deleted

## 2021-11-19 MED ORDER — ENBREL SURECLICK 50 MG/ML ~~LOC~~ SOAJ: 50.0000 mg | SUBCUTANEOUS | 0 refills | Status: DC

## 2021-11-19 NOTE — Telephone Encounter (Signed)
Next Visit: 11/27/2021  Last Visit: 09/17/2021  Last Fill: 08/18/2021  [id number] rheumatoid arthritis of multiple sites   Current Dose per office note 46/27/0350: Enbrel SureClick 50 mg every 7 days.   Labs: 11/13/2021 CBC WNL.  Total protein is borderline low-5.9.  albumin is low-3.4.  rest of CMP WNL.  We will continue to monitor.   TB Gold: 08/15/2021 Neg    Okay to refill Enbrel?

## 2021-11-20 MED ORDER — ENBREL SURECLICK 50 MG/ML ~~LOC~~ SOAJ: 50.0000 mg | SUBCUTANEOUS | 0 refills | Status: DC

## 2021-11-20 NOTE — Addendum Note (Signed)
Addended by: Carole Binning on: 11/20/2021 10:25 AM   Modules accepted: Orders

## 2021-11-27 ENCOUNTER — Encounter: Payer: Self-pay | Admitting: Physician Assistant

## 2021-11-27 ENCOUNTER — Ambulatory Visit: Payer: PPO | Admitting: Physician Assistant

## 2021-11-27 ENCOUNTER — Other Ambulatory Visit: Payer: Self-pay

## 2021-11-27 VITALS — BP 132/81 | HR 83 | Resp 13 | Ht 64.0 in | Wt 179.4 lb

## 2021-11-27 DIAGNOSIS — Z8659 Personal history of other mental and behavioral disorders: Secondary | ICD-10-CM

## 2021-11-27 DIAGNOSIS — M810 Age-related osteoporosis without current pathological fracture: Secondary | ICD-10-CM

## 2021-11-27 DIAGNOSIS — Z8669 Personal history of other diseases of the nervous system and sense organs: Secondary | ICD-10-CM

## 2021-11-27 DIAGNOSIS — C50412 Malignant neoplasm of upper-outer quadrant of left female breast: Secondary | ICD-10-CM | POA: Diagnosis not present

## 2021-11-27 DIAGNOSIS — M5136 Other intervertebral disc degeneration, lumbar region: Secondary | ICD-10-CM | POA: Diagnosis not present

## 2021-11-27 DIAGNOSIS — M19041 Primary osteoarthritis, right hand: Secondary | ICD-10-CM

## 2021-11-27 DIAGNOSIS — M5030 Other cervical disc degeneration, unspecified cervical region: Secondary | ICD-10-CM | POA: Diagnosis not present

## 2021-11-27 DIAGNOSIS — Z8639 Personal history of other endocrine, nutritional and metabolic disease: Secondary | ICD-10-CM

## 2021-11-27 DIAGNOSIS — M0579 Rheumatoid arthritis with rheumatoid factor of multiple sites without organ or systems involvement: Secondary | ICD-10-CM

## 2021-11-27 DIAGNOSIS — M797 Fibromyalgia: Secondary | ICD-10-CM

## 2021-11-27 DIAGNOSIS — Z96641 Presence of right artificial hip joint: Secondary | ICD-10-CM

## 2021-11-27 DIAGNOSIS — M19042 Primary osteoarthritis, left hand: Secondary | ICD-10-CM

## 2021-11-27 DIAGNOSIS — M5134 Other intervertebral disc degeneration, thoracic region: Secondary | ICD-10-CM

## 2021-11-27 DIAGNOSIS — Z171 Estrogen receptor negative status [ER-]: Secondary | ICD-10-CM

## 2021-11-27 DIAGNOSIS — Z79899 Other long term (current) drug therapy: Secondary | ICD-10-CM

## 2021-11-27 MED ORDER — METHOTREXATE 2.5 MG PO TABS: ORAL_TABLET | ORAL | 0 refills | Status: DC

## 2021-11-27 MED ORDER — FOLIC ACID 1 MG PO TABS: 2.0000 mg | ORAL_TABLET | Freq: Every day | ORAL | 3 refills | Status: DC

## 2021-11-27 NOTE — Patient Instructions (Signed)
Standing Labs We placed an order today for your standing lab work.   Please have your standing labs drawn in 2 weeks   If possible, please have your labs drawn 2 weeks prior to your appointment so that the provider can discuss your results at your appointment.  Please note that you may see your imaging and lab results in [place] before we have reviewed them. We may be awaiting multiple results to interpret others before contacting you. Please allow our office up to 72 hours to thoroughly review all of the results before contacting the office for clarification of your results.  We have open lab daily: Monday through Thursday from 1:30-4:30 PM and Friday from 1:30-4:00 PM at the office of Dr. Bo Merino, [facility].   Please be advised, all patients with office appointments requiring lab work will take precedent over walk-in lab work.  If possible, please come for your lab work on Monday and Friday afternoons, as you may experience shorter wait times. The office is located at [address] [place], [place], [place] [address] No appointment is necessary.   Labs are drawn by [facility]. Please bring your co-pay at the time of your lab draw.  You may receive a bill from [facility] for your lab work.  Please note if you are on Hydroxychloroquine and and an order has been placed for a Hydroxychloroquine level, you will need to have it drawn 4 hours or more after your last dose.  If you wish to have your labs drawn at another location, please call the office 24 hours in advance to send orders.  If you have any questions regarding directions or hours of operation,  please call [phone].   As a reminder, please drink plenty of water prior to coming for your lab work. Thanks!   Methotrexate Tablets What is this medication? METHOTREXATE (METH oh TREX ate) treats inflammatory conditions such as arthritis and psoriasis. It works by decreasing inflammation, which can reduce pain and  prevent long-term injury to the joints and skin. It may also be used to treat some types of cancer. It works by slowing down the growth of cancer cells. This medicine may be used for other purposes; ask your health care provider or pharmacist if you have questions. COMMON BRAND NAME(S): Rheumatrex, Trexall What should I tell my care team before I take this medication? They need to know if you have any of these conditions: Fluid in the stomach area or lungs If you often drink alcohol Infection or immune system problems Kidney disease or on hemodialysis Liver disease Low blood counts, like low white cell, platelet, or red cell counts Lung disease Radiation therapy Stomach ulcers Ulcerative colitis An unusual or allergic reaction to methotrexate, other medications, foods, dyes, or preservatives Pregnant or trying to get pregnant Breast-feeding How should I use this medication? Take this medication by mouth with a glass of water. Follow the directions on the prescription label. Take your medication at regular intervals. Do not take it more often than directed. Do not stop taking except on your care team's advice. Make sure you know why you are taking this medication and how often you should take it. If this medication is used for a condition that is not cancer, like arthritis or psoriasis, it should be taken weekly, NOT daily. Taking this medication more often than directed can cause serious side effects, even death. Talk to your care team about safe handling and disposal of this medication. You may need to  take special precautions. Talk to your care team about the use of this medication in children. While this medication may be prescribed for selected conditions, precautions do apply. Overdosage: If you think you have taken too much of this medicine contact a poison control center or emergency room at once. NOTE: This medicine is only for you. Do not share this medicine with others. What if I  miss a dose? If you miss a dose, talk with your care team. Do not take double or extra doses. What may interact with this medication? Do not take this medication with any of the following: Acitretin This medication may also interact with the following: Aspirin and aspirin-like medications including salicylates Azathioprine Certain antibiotics like penicillins, tetracycline, and chloramphenicol Certain medications that treat or prevent blood clots like warfarin, apixaban, dabigatran, and rivaroxaban Certain medications for stomach problems like esomeprazole, omeprazole, pantoprazole Cyclosporine Dapsone Diuretics Gold Hydroxychloroquine Live virus vaccines Medications for infection like acyclovir, adefovir, amphotericin B, bacitracin, cidofovir, foscarnet, ganciclovir, gentamicin, pentamidine, vancomycin Mercaptopurine NSAIDs, medications for pain and inflammation, like ibuprofen or naproxen Other cytotoxic agents Pamidronate Pemetrexed Penicillamine Phenylbutazone Phenytoin Probenecid Pyrimethamine Retinoids such as isotretinoin and tretinoin Steroid medications like prednisone or cortisone Sulfonamides like sulfasalazine and trimethoprim/sulfamethoxazole Theophylline Zoledronic acid This list may not describe all possible interactions. Give your health care provider a list of all the medicines, herbs, non-prescription drugs, or dietary supplements you use. Also tell them if you smoke, drink alcohol, or use illegal drugs. Some items may interact with your medicine. What should I watch for while using this medication? Avoid alcoholic drinks. This medication can make you more sensitive to the sun. Keep out of the sun. If you cannot avoid being in the sun, wear protective clothing and use sunscreen. Do not use sun lamps or tanning beds/booths. You may need blood work done while you are taking this medication. Call your care team for advice if you get a fever, chills or sore throat,  or other symptoms of a cold or flu. Do not treat yourself. This medication decreases your body's ability to fight infections. Try to avoid being around people who are sick. This medication may increase your risk to bruise or bleed. Call your care team if you notice any unusual bleeding. Be careful brushing or flossing your teeth or using a toothpick because you may get an infection or bleed more easily. If you have any dental work done, tell your dentist you are receiving this medication. Check with your care team if you get an attack of severe diarrhea, nausea and vomiting, or if you sweat a lot. The loss of too much body fluid can make it dangerous for you to take this medication. Talk to your care team about your risk of cancer. You may be more at risk for certain types of cancers if you take this medication. Do not become pregnant while taking this medication or for 6 months after stopping it. Women should inform their care team if they wish to become pregnant or think they might be pregnant. Men should not father a child while taking this medication and for 3 months after stopping it. There is potential for serious harm to an unborn child. Talk to your care team for more information. Do not breast-feed an infant while taking this medication or for 1 week after stopping it. This medication may make it more difficult to get pregnant or father a child. Talk to your care team if you are concerned about your fertility. What side  effects may I notice from receiving this medication? Side effects that you should report to your care team as soon as possible: Allergic reactions--skin rash, itching, hives, swelling of the face, lips, tongue, or throat Blood clot--pain, swelling, or warmth in the leg, shortness of breath, chest pain Dry cough, shortness of breath or trouble breathing Infection--fever, chills, cough, sore throat, wounds that don't heal, pain or trouble when passing urine, general feeling of  discomfort or being unwell Kidney injury--decrease in the amount of urine, swelling of the ankles, hands, or feet Liver injury--right upper belly pain, loss of appetite, nausea, light-colored stool, dark yellow or brown urine, yellowing of the skin or eyes, unusual weakness or fatigue Low red blood cell count--unusual weakness or fatigue, dizziness, headache, trouble breathing Redness, blistering, peeling, or loosening of the skin, including inside the mouth Seizures Unusual bruising or bleeding Side effects that usually do not require medical attention (report to your care team if they continue or are bothersome): Diarrhea Dizziness Hair loss Nausea Pain, redness, or swelling with sores inside the mouth or throat Vomiting This list may not describe all possible side effects. Call your doctor for medical advice about side effects. You may report side effects to FDA at 1-800-FDA-1088. Where should I keep my medication? Keep out of the reach of children and pets. Store at room temperature between 20 and 25 degrees C (68 and 77 degrees F). Protect from light. Get rid of any unused medication after the expiration date. Talk to your care team about how to dispose of unused medication. Special directions may apply. NOTE: This sheet is a summary. It may not cover all possible information. If you have questions about this medicine, talk to your doctor, pharmacist, or health care provider.  2022 Elsevier/Gold Standard (2020-12-09 00:00:00)

## 2021-12-12 ENCOUNTER — Other Ambulatory Visit: Payer: Self-pay | Admitting: *Deleted

## 2021-12-12 DIAGNOSIS — Z79899 Other long term (current) drug therapy: Secondary | ICD-10-CM | POA: Diagnosis not present

## 2021-12-14 NOTE — Progress Notes (Signed)
CBC and CMP are normal.

## 2021-12-15 ENCOUNTER — Other Ambulatory Visit: Payer: Self-pay | Admitting: *Deleted

## 2021-12-15 LAB — COMPLETE METABOLIC PANEL WITH GFR
AG Ratio: 1.3 (calc) (ref 1.0–2.5)
ALT: 9 U/L (ref 6–29)
AST: 13 U/L (ref 10–35)
Albumin: 3.5 g/dL — ABNORMAL LOW (ref 3.6–5.1)
Alkaline phosphatase (APISO): 79 U/L (ref 37–153)
BUN: 18 mg/dL (ref 7–25)
CO2: 24 mmol/L (ref 20–32)
Calcium: 9.2 mg/dL (ref 8.6–10.4)
Chloride: 106 mmol/L (ref 98–110)
Creat: 0.73 mg/dL (ref 0.60–1.00)
Globulin: 2.8 g/dL (calc) (ref 1.9–3.7)
Glucose, Bld: 86 mg/dL (ref 65–99)
Potassium: 4.3 mmol/L (ref 3.5–5.3)
Sodium: 139 mmol/L (ref 135–146)
Total Bilirubin: 0.5 mg/dL (ref 0.2–1.2)
Total Protein: 6.3 g/dL (ref 6.1–8.1)
eGFR: 86 mL/min/{1.73_m2} (ref >=60)

## 2021-12-15 LAB — CBC WITH DIFFERENTIAL/PLATELET
Absolute Monocytes: 619 cells/uL (ref 200–950)
Basophils Absolute: 22 cells/uL (ref 0–200)
Basophils Relative: 0.3 %
Eosinophils Absolute: 173 cells/uL (ref 15–500)
Eosinophils Relative: 2.4 %
HCT: 39.9 % (ref 35.0–45.0)
Hemoglobin: 13.1 g/dL (ref 11.7–15.5)
Lymphs Abs: 2182 cells/uL (ref 850–3900)
MCH: 29.4 pg (ref 27.0–33.0)
MCHC: 32.8 g/dL (ref 32.0–36.0)
MCV: 89.7 fL (ref 80.0–100.0)
MPV: 11.9 fL (ref 7.5–12.5)
Monocytes Relative: 8.6 %
Neutro Abs: 4205 cells/uL (ref 1500–7800)
Neutrophils Relative %: 58.4 %
Platelets: 241 10*3/uL (ref 140–400)
RBC: 4.45 10*6/uL (ref 3.80–5.10)
RDW: 12.3 % (ref 11.0–15.0)
Total Lymphocyte: 30.3 %
WBC: 7.2 10*3/uL (ref 3.8–10.8)

## 2021-12-15 LAB — HEPATITIS B SURFACE ANTIGEN: Hepatitis B Surface Ag: NONREACTIVE

## 2021-12-15 LAB — HEPATITIS C ANTIBODY
Hepatitis C Ab: NONREACTIVE
SIGNAL TO CUT-OFF: <0.02 (ref <1.00)

## 2021-12-15 LAB — HEPATITIS B CORE ANTIBODY, IGM: Hep B C IgM: NONREACTIVE

## 2021-12-15 MED ORDER — METHOTREXATE 2.5 MG PO TABS: 20.0000 mg | ORAL_TABLET | ORAL | 0 refills | Status: DC

## 2021-12-15 NOTE — Telephone Encounter (Signed)
Patient states she has increased her MTX to 8 tabs weekly and will need refill sent to the pharmacy.

## 2021-12-15 NOTE — Telephone Encounter (Signed)
-----   Message from Bo Merino, MD sent at 12/14/2021  5:14 PM EST ----- CBC and CMP are normal.

## 2021-12-23 ENCOUNTER — Ambulatory Visit (INDEPENDENT_AMBULATORY_CARE_PROVIDER_SITE_OTHER): Payer: PPO | Admitting: Family Medicine

## 2021-12-23 ENCOUNTER — Other Ambulatory Visit: Payer: Self-pay

## 2021-12-23 ENCOUNTER — Encounter: Payer: Self-pay | Admitting: Family Medicine

## 2021-12-23 ENCOUNTER — Telehealth: Payer: Self-pay | Admitting: Rheumatology

## 2021-12-23 VITALS — BP 130/83 | HR 86 | Temp 98.3°F | Ht 64.0 in | Wt 175.2 lb

## 2021-12-23 DIAGNOSIS — Z853 Personal history of malignant neoplasm of breast: Secondary | ICD-10-CM

## 2021-12-23 DIAGNOSIS — M5135 Other intervertebral disc degeneration, thoracolumbar region: Secondary | ICD-10-CM

## 2021-12-23 DIAGNOSIS — M0579 Rheumatoid arthritis with rheumatoid factor of multiple sites without organ or systems involvement: Secondary | ICD-10-CM

## 2021-12-23 DIAGNOSIS — R7309 Other abnormal glucose: Secondary | ICD-10-CM | POA: Diagnosis not present

## 2021-12-23 DIAGNOSIS — M810 Age-related osteoporosis without current pathological fracture: Secondary | ICD-10-CM | POA: Diagnosis not present

## 2021-12-23 DIAGNOSIS — G479 Sleep disorder, unspecified: Secondary | ICD-10-CM | POA: Diagnosis not present

## 2021-12-23 DIAGNOSIS — E039 Hypothyroidism, unspecified: Secondary | ICD-10-CM

## 2021-12-23 DIAGNOSIS — E559 Vitamin D deficiency, unspecified: Secondary | ICD-10-CM | POA: Diagnosis not present

## 2021-12-23 LAB — TSH: TSH: 1.11 u[IU]/mL (ref 0.35–5.50)

## 2021-12-23 LAB — HEMOGLOBIN A1C: Hgb A1c MFr Bld: 5.6 % (ref 4.6–6.5)

## 2021-12-23 LAB — VITAMIN D 25 HYDROXY (VIT D DEFICIENCY, FRACTURES): VITD: 33.56 ng/mL (ref 30.00–100.00)

## 2021-12-23 MED ORDER — GABAPENTIN 100 MG PO CAPS: ORAL_CAPSULE | ORAL | 3 refills | Status: DC

## 2021-12-23 MED ORDER — LORAZEPAM 1 MG PO TABS: 1.0000 mg | ORAL_TABLET | Freq: Every evening | ORAL | 1 refills | Status: DC | PRN

## 2021-12-23 NOTE — Assessment & Plan Note (Signed)
TSH ordered  ?Taking 137 mcg daily levothyroxine  ?No clinical changes ? ?Pend result, will refill ?

## 2021-12-23 NOTE — Patient Instructions (Signed)
Labs today  ? ?Take care of yourself  ? ?Try to eat a balanced diet  ?Get outdoors when you can  ? ? ?

## 2021-12-23 NOTE — Assessment & Plan Note (Signed)
Uses ativan infrequently  ?Disc risk of falls/habit/sedation  ?Will continue to monitor  ? ?

## 2021-12-23 NOTE — Assessment & Plan Note (Signed)
Declines further tx or screening (mammogram) ?

## 2021-12-23 NOTE — Assessment & Plan Note (Signed)
Declines further eval (dexa) or treatment ?Takes vit d ?No falls or fractures ?

## 2021-12-23 NOTE — Telephone Encounter (Signed)
[facility] [address] in [place] left a voicemail stating they are having to switch manufacturers for Methotrexate and they need providers approval to change it for the patient. They state they don't need a refill just and ok to switch. [phone] ?

## 2021-12-23 NOTE — Telephone Encounter (Signed)
Spoke with the pharmacy and advised okay to switch manufacturers.  ?

## 2021-12-23 NOTE — Assessment & Plan Note (Signed)
D level added to labs ?Disc imp to bone and overall health  ?Takes 1000 iu daily ?

## 2021-12-23 NOTE — Assessment & Plan Note (Signed)
Continues gabapentin  ?May require inc in dose soon, more symptomatic  ?Med is helpful ?

## 2021-12-23 NOTE — Progress Notes (Signed)
Subjective:    Patient ID: Samantha Valentine, female    DOB: October 19, 1946, 76 y.o.   MRN: [id number]  This visit occurred during the SARS-CoV-2 public health emergency.  Safety protocols were in place, including screening questions prior to the visit, additional usage of staff PPE, and extensive cleaning of exam room while observing appropriate contact time as indicated for disinfecting solutions.   HPI Pt presents for f/u of hypothyroid and other chronic health problems   Wt Readings from Last 3 Encounters:  12/23/21 175 lb 4 oz (79.5 kg)  11/27/21 179 lb 6.4 oz (81.4 kg)  09/17/21 177 lb (80.3 kg)   30.08 kg/m  Had a rough year  RA is giving her a tough time as she gets older   Self care is not as good due to more pain   Brother died in [place] was tough, she is Advertising account planner of estate  A lot of work  Unexpected death at 89 /had a fall    Hypothyroidism Lab Results  Component Value Date   TSH 3.19 01/30/2021   Levothyroxine 137 mcg daily  Does not feel different  Sleep is fair  No palpitations   H/o elevated glucose in the past  Lab Results  Component Value Date   HGBA1C 5.6 01/30/2021  Brother and sister had diabetes    RA Methotrexate-back on  Enbrel- not working well anymore  celebrex  DDD - cervical and CS, spinal  Thoracic pain is more chronic now  Also myofascial pain  Takes gabapentin 100 mg tid  May need to go up on dose later   Osteoporosis  Dexa 2018 -declines another Declines treatment  Taking vitamin D   BP Readings from Last 3 Encounters:  12/23/21 (!) 144/82  11/27/21 132/81  09/17/21 (!) 151/86   Pulse Readings from Last 3 Encounters:  12/23/21 86  11/27/21 83  09/17/21 87   Lab Results  Component Value Date   CREATININE 0.73 12/12/2021   BUN 18 12/12/2021   NA 139 12/12/2021   K 4.3 12/12/2021   CL 106 12/12/2021   CO2 24 12/12/2021   Lab Results  Component Value Date   ALT 9 12/12/2021   AST 13 12/12/2021   ALKPHOS 85  01/30/2021   BILITOT 0.5 12/12/2021   Lab Results  Component Value Date   WBC 7.2 12/12/2021   HGB 13.1 12/12/2021   HCT 39.9 12/12/2021   MCV 89.7 12/12/2021   PLT 241 12/12/2021   Had breast cancer Was treated  Declines mammogram now   Had flu shot in oct  Had bivalent covid booster in nov   Patient Active Problem List   Diagnosis Date Noted   History of breast cancer 12/23/2021   Elevated glucose level 11/23/2018   Routine general medical examination at a health care facility 08/09/2018   DDD (degenerative disc disease), thoracolumbar 06/28/2018   DDD (degenerative disc disease), cervical 06/28/2018   Atherosclerosis of aorta ([place]) 03/11/2018   Sleep disturbance 06/18/2017   Tachycardia 05/25/2017   Dysuria 05/25/2017   Estrogen deficiency 03/22/2017   Malignant neoplasm of upper-outer quadrant of left breast in female, estrogen receptor negative ([place]) 02/04/2017   Tendinopathy of right shoulder 01/25/2017   High risk medication use 09/29/2016   DJD (degenerative joint disease), cervical 09/29/2016   History of right hip replacement 09/29/2016   Eczema 07/13/2014   Adverse effect of immunosuppressive drug 04/27/2012   ANXIETY DEPRESSION 10/28/2008   Spinal stenosis 12/29/2007   Hypothyroidism 12/28/2007  Vitamin D deficiency 12/28/2007   HEARING LOSS 12/28/2007   Seropositive rheumatoid arthritis of multiple sites ([place]) 12/28/2007   DDD (degenerative disc disease), lumbar 12/28/2007   Fibromyalgia 12/28/2007   Osteoporosis 12/28/2007   MIGRAINES, HX OF 12/28/2007   Past Medical History:  Diagnosis Date   Anxiety    Arthritis    RA   Breast cancer ([facility]) 01/29/2017   Breast mass 1 year    Cancer ([place]) 01/29/2017   left breast INVASIVE MAMMARY CARCINOMA, ER/PR positive   Cataract 2019   resolved with surgery   Collagen vascular disease ([place])    Rhematoid Arthritis   Complication of anesthesia    DDD (degenerative disc disease)    in neck   Depression     Dyspnea    Edema    FEET/LEGS   Emphysema of lung ([facility])    Fibromyalgia    GERD (gastroesophageal reflux disease)    NO MEDS   History of hiatal hernia    History of kidney stones    MULTIPLE KIDNEY STONES BIL   HOH (hard of hearing)    AIDS   Hypothyroidism    Macular degeneration, bilateral    Migraine    Osteoporosis    Palpitations    Personal history of chemotherapy    prior to mastectomy   PONV (postoperative nausea and vomiting)    AFTER FIRST CATARACT   Rheumatoid arthritis ([place])    Past Surgical History:  Procedure Laterality Date   BREAST BIOPSY Left 01/29/2017   US guided biopsy INVASIVE MAMMARY CARCINOMA   CATARACT EXTRACTION W/PHACO Left 04/28/2018   Procedure: CATARACT EXTRACTION PHACO AND INTRAOCULAR LENS PLACEMENT ([place]);  Surgeon: Leandrew Koyanagi, MD;  Location: [facility] ORS;  Service: Ophthalmology;  Laterality: Left;  Lot # [id number] H [place]   1:03 AP%   13.4 CDE    8.40   CATARACT EXTRACTION W/PHACO Right 06/09/2018   Procedure: CATARACT EXTRACTION PHACO AND INTRAOCULAR LENS PLACEMENT ([facility]);  Surgeon: Leandrew Koyanagi, MD;  Location: [facility];  Service: Ophthalmology;  Laterality: Right;  lot# [id number] [place] 0:55 ap 16.3% cde 8.21   HIP ARTHROPLASTY     JOINT REPLACEMENT Right 2003   total hip replacement   LITHOTRIPSY  1997   kidney stone   MASTECTOMY Left 01/29/2017   MASTECTOMY W/ SENTINEL NODE BIOPSY Left 05/18/2017   Procedure: MASTECTOMY WITH SENTINEL LYMPH NODE BIOPSY;  Surgeon: Robert Bellow, MD;  Location: [facility] OR[facility];  Service: General;  Laterality: Left;   PORT-A-CATH REMOVAL  09/2017   PORTACATH PLACEMENT Right 02/15/2017   Procedure: INSERTION PORT-A-CATH;  Surgeon: Robert Bellow, MD;  Location: [facility] OR[facility];  Service: General;  Laterality: Right;   TONSILLECTOMY  1970   Social History   Tobacco Use   Smoking status: Former    Packs/day: 1.50    Years: 25.00    Pack years: 37.50    Types: Cigarettes    Quit date: 10/20/1983     Years since quitting: 38.2   Smokeless tobacco: Never  Vaping Use   Vaping Use: Never used  Substance Use Topics   Alcohol use: No   Drug use: No   Family History  Problem Relation Age of Onset   Hypertension Mother    Endometrial cancer Mother    Osteoarthritis Mother    Alcohol abuse Father    Diabetes Father    Stroke Father    Liver disease Sister    Breast cancer Maternal Aunt    Breast cancer Paternal Aunt  47   Rheum arthritis Maternal Grandmother    Diabetes Paternal Grandmother    Parkinson's disease Paternal Grandfather    Allergies  Allergen Reactions   Adhesive [Tape] Other (See Comments)    Skin blistered (PAPER TAPE OK)   Cymbalta [Duloxetine Hcl]     Headache Inc bp   Hydroxychloroquine Sulfate Rash   Ibandronate Sodium Rash   Risedronate Sodium Rash   Current Outpatient Medications on File Prior to Visit  Medication Sig Dispense Refill   aspirin EC 81 MG tablet Take 81 mg by mouth daily.     celecoxib (CELEBREX) 200 MG capsule TAKE 1 CAPSULE BY MOUTH TWICE DAILY WITH FOOD AS NEEDED 120 capsule 0   cholecalciferol (VITAMIN D) 1000 units tablet Take 1,000 Units by mouth daily.     etanercept (ENBREL SURECLICK) 50 MG/ML injection Inject 50 mg into the skin once a week. 12 mL 0   folic acid (FOLVITE) 1 MG tablet Take 2 tablets (2 mg total) by mouth daily. 180 tablet 3   levothyroxine (EUTHYROX) 137 MCG tablet TAKE 1 TABLET BY MOUTH ONCE DAILY BEFORE BREAKFAST 90 tablet 2   methotrexate (RHEUMATREX) 2.5 MG tablet Take 8 tablets (20 mg total) by mouth once a week. Caution:Chemotherapy. Protect from light. 96 tablet 0   No current facility-administered medications on file prior to visit.    Review of Systems  Constitutional:  Positive for fatigue. Negative for activity change, appetite change, fever and unexpected weight change.  HENT:  Negative for congestion, ear pain, rhinorrhea, sinus pressure and sore throat.   Eyes:  Negative for pain, redness and visual  disturbance.  Respiratory:  Negative for cough, shortness of breath and wheezing.   Cardiovascular:  Negative for chest pain and palpitations.  Gastrointestinal:  Negative for abdominal pain, blood in stool, constipation and diarrhea.  Endocrine: Negative for polydipsia and polyuria.  Genitourinary:  Negative for dysuria, frequency and urgency.  Musculoskeletal:  Positive for arthralgias, back pain, gait problem and joint swelling. Negative for myalgias.  Skin:  Negative for pallor and rash.  Allergic/Immunologic: Negative for environmental allergies.  Neurological:  Negative for dizziness, syncope and headaches.  Hematological:  Negative for adenopathy. Does not bruise/bleed easily.  Psychiatric/Behavioral:  Negative for decreased concentration and dysphoric mood. The patient is not nervous/anxious.        Gets anxious and down at times due to chronic pain       Objective:   Physical Exam Constitutional:      General: She is not in acute distress.    Appearance: Normal appearance. She is well-developed. She is obese. She is not ill-appearing or diaphoretic.  HENT:     Head: Normocephalic and atraumatic.  Eyes:     Conjunctiva/sclera: Conjunctivae normal.     Pupils: Pupils are equal, round, and reactive to light.  Neck:     Thyroid: No thyromegaly.     Vascular: No carotid bruit or JVD.  Cardiovascular:     Rate and Rhythm: Normal rate and regular rhythm.     Heart sounds: Normal heart sounds.    No gallop.  Pulmonary:     Effort: Pulmonary effort is normal. No respiratory distress.     Breath sounds: Normal breath sounds. No wheezing or rales.  Abdominal:     General: Abdomen is flat. There is no abdominal bruit.  Musculoskeletal:     Cervical back: Normal range of motion and neck supple.     Right lower leg: No edema.  Left lower leg: No edema.     Comments: Joint changes consistent with RA Limited rom of spine  Slow gait/labored   Lymphadenopathy:     Cervical: No  cervical adenopathy.  Skin:    General: Skin is warm and dry.     Coloration: Skin is not pale.     Findings: No rash.  Neurological:     Mental Status: She is alert.     Cranial Nerves: No cranial nerve deficit.     Coordination: Coordination normal.     Deep Tendon Reflexes: Reflexes are normal and symmetric. Reflexes normal.  Psychiatric:        Mood and Affect: Mood normal.        Cognition and Memory: Cognition and memory normal.     Comments: Pleasant           Assessment & Plan:   Problem List Items Addressed This Visit       Endocrine   Hypothyroidism - Primary    TSH ordered  Taking 137 mcg daily levothyroxine  No clinical changes  Pend result, will refill      Relevant Orders   TSH     Musculoskeletal and Integument   DDD (degenerative disc disease), thoracolumbar    Continues gabapentin  May require inc in dose soon, more symptomatic  Med is helpful      Osteoporosis    Declines further eval (dexa) or treatment Takes vit d No falls or fractures      Seropositive rheumatoid arthritis of multiple sites ([place])    Under care of rheumatology  Struggling  enbrel not working well  Back on mtx Folic acid celebrex  Has freq labs-reviewed        Other   Elevated glucose level    a1c ordered Diet is not changed disc imp of low glycemic diet and wt loss to prevent DM2  Has fam h/o DM      Relevant Orders   Hemoglobin A1c   History of breast cancer    Declines further tx or screening (mammogram)      Sleep disturbance    Uses ativan infrequently  Disc risk of falls/habit/sedation  Will continue to monitor        Vitamin D deficiency    D level added to labs Disc imp to bone and overall health  Takes 1000 iu daily      Relevant Orders   VITAMIN D 25 Hydroxy (Vit-D Deficiency, Fractures)

## 2021-12-23 NOTE — Assessment & Plan Note (Signed)
a1c ordered ?Diet is not changed ?disc imp of low glycemic diet and wt loss to prevent DM2  ?Has fam h/o DM ?

## 2021-12-23 NOTE — Assessment & Plan Note (Signed)
Under care of rheumatology  ?Struggling  ?enbrel not working well  ?Back on mtx ?Folic acid ?celebrex  ?Has freq labs-reviewed ?

## 2021-12-25 ENCOUNTER — Other Ambulatory Visit: Payer: Self-pay | Admitting: Family Medicine

## 2021-12-26 DIAGNOSIS — H353132 Nonexudative age-related macular degeneration, bilateral, intermediate dry stage: Secondary | ICD-10-CM | POA: Diagnosis not present

## 2022-01-02 NOTE — Progress Notes (Signed)
Office Visit Note  Patient: Samantha Valentine             Date of Birth: 24-Jul-1946           MRN: [id number]             PCP: Judy Pimple, MD Referring: Tower, Audrie Gallus, MD Visit Date: 01/15/2022 Occupation: @GUAROCC @  Subjective:  Medication monitoring   History of Present Illness: Samantha Valentine is a 76 y.o. female with history of seropositive rheumatoid arthritis, osteoarthritis, DDD, fibromyalgia, and osteoporosis.  Patient remains on Enbrel 50 mg sq injections once weekly.  Methotrexate was restarted after her last office visit due to recurrent flares.  She is currently taking methotrexate 8 tablets by mouth once weekly and folic acid 2 mg daily.  After her last office visit on 11/27/2021 she was restarted on oral methotrexate.  She initially started on 6 tablets of methotrexate which she took for 2 weeks and then increase to 8 tablets once weekly.  She has been experiencing some increased GI upset on oral methotrexate and would like to try switching to injectable methotrexate.  Overall she has noticed about an 80 to 90% improvement in her joint pain and inflammation since adding methotrexate back on his combination therapy.  She denies any recent infections.    Activities of Daily Living:  Patient reports joint stiffness all day  Patient Reports nocturnal pain.  Difficulty dressing/grooming: Reports Difficulty climbing stairs: Reports Difficulty getting out of chair: Reports Difficulty using hands for taps, buttons, cutlery, and/or writing: Reports  Review of Systems  Constitutional:  Positive for fatigue.  HENT:  Positive for mouth dryness. Negative for mouth sores and nose dryness.   Eyes:  Positive for dryness. Negative for pain and visual disturbance.  Respiratory:  Negative for cough, hemoptysis and difficulty breathing.   Cardiovascular:  Positive for swelling in legs/feet. Negative for chest pain, palpitations and hypertension.  Gastrointestinal:  Positive for  constipation. Negative for blood in stool and diarrhea.  Endocrine: Negative for cold intolerance and increased urination.  Genitourinary:  Negative for difficulty urinating and painful urination.  Musculoskeletal:  Positive for joint pain, gait problem, joint pain, joint swelling, muscle weakness, morning stiffness and muscle tenderness. Negative for myalgias and myalgias.  Skin:  Positive for rash. Negative for color change, pallor, hair loss, nodules/bumps, skin tightness, ulcers and sensitivity to sunlight.  Allergic/Immunologic: Negative for susceptible to infections.  Neurological:  Positive for numbness. Negative for dizziness, headaches and weakness.  Hematological:  Negative for bruising/bleeding tendency and swollen glands.  Psychiatric/Behavioral:  Positive for sleep disturbance. Negative for depressed mood. The patient is not nervous/anxious.    PMFS History:  Patient Active Problem List   Diagnosis Date Noted   History of breast cancer 12/23/2021   Elevated glucose level 11/23/2018   Routine general medical examination at a health care facility 08/09/2018   DDD (degenerative disc disease), thoracolumbar 06/28/2018   DDD (degenerative disc disease), cervical 06/28/2018   Atherosclerosis of aorta (HCC) 03/11/2018   Sleep disturbance 06/18/2017   Tachycardia 05/25/2017   Dysuria 05/25/2017   Estrogen deficiency 03/22/2017   Malignant neoplasm of upper-outer quadrant of left breast in female, estrogen receptor negative (HCC) 02/04/2017   Tendinopathy of right shoulder 01/25/2017   High risk medication use 09/29/2016   DJD (degenerative joint disease), cervical 09/29/2016   History of right hip replacement 09/29/2016   Eczema 07/13/2014   Adverse effect of immunosuppressive drug 04/27/2012   ANXIETY DEPRESSION 10/28/2008  Spinal stenosis 12/29/2007   Hypothyroidism 12/28/2007   Vitamin D deficiency 12/28/2007   HEARING LOSS 12/28/2007   Seropositive rheumatoid arthritis of  multiple sites (HCC) 12/28/2007   DDD (degenerative disc disease), lumbar 12/28/2007   Fibromyalgia 12/28/2007   Osteoporosis 12/28/2007   MIGRAINES, HX OF 12/28/2007    Past Medical History:  Diagnosis Date   Anxiety    Arthritis    RA   Breast cancer (HCC) 01/29/2017   Breast mass 1 year    Cancer (HCC) 01/29/2017   left breast INVASIVE MAMMARY CARCINOMA, ER/PR positive   Cataract 2019   resolved with surgery   Collagen vascular disease (HCC)    Rhematoid Arthritis   Complication of anesthesia    DDD (degenerative disc disease)    in neck   Depression    Dyspnea    Edema    FEET/LEGS   Emphysema of lung (HCC)    Fibromyalgia    GERD (gastroesophageal reflux disease)    NO MEDS   History of hiatal hernia    History of kidney stones    MULTIPLE KIDNEY STONES BIL   HOH (hard of hearing)    AIDS   Hypothyroidism    Macular degeneration, bilateral    Migraine    Osteoporosis    Palpitations    Personal history of chemotherapy    prior to mastectomy   PONV (postoperative nausea and vomiting)    AFTER FIRST CATARACT   Rheumatoid arthritis (HCC)     Family History  Problem Relation Age of Onset   Hypertension Mother    Endometrial cancer Mother    Osteoarthritis Mother    Alcohol abuse Father    Diabetes Father    Stroke Father    Liver disease Sister    Breast cancer Maternal Aunt    Breast cancer Paternal Aunt 30   Rheum arthritis Maternal Grandmother    Diabetes Paternal Grandmother    Parkinson's disease Paternal Grandfather    Past Surgical History:  Procedure Laterality Date   BREAST BIOPSY Left 01/29/2017   US guided biopsy INVASIVE MAMMARY CARCINOMA   CATARACT EXTRACTION W/PHACO Left 04/28/2018   Procedure: CATARACT EXTRACTION PHACO AND INTRAOCULAR LENS PLACEMENT (IOC);  Surgeon: Lockie Mola, MD;  Location: [facility];  Service: Ophthalmology;  Laterality: Left;  Lot # [id number]   1:03 AP%   13.4 CDE    8.40   CATARACT EXTRACTION W/PHACO  Right 06/09/2018   Procedure: CATARACT EXTRACTION PHACO AND INTRAOCULAR LENS PLACEMENT (IOC);  Surgeon: Lockie Mola, MD;  Location: [facility];  Service: Ophthalmology;  Laterality: Right;  lot# [id number] [place] 0:55 ap 16.3% cde 8.21   HIP ARTHROPLASTY     JOINT REPLACEMENT Right 2003   total hip replacement   LITHOTRIPSY  1997   kidney stone   MASTECTOMY Left 01/29/2017   MASTECTOMY W/ SENTINEL NODE BIOPSY Left 05/18/2017   Procedure: MASTECTOMY WITH SENTINEL LYMPH NODE BIOPSY;  Surgeon: Earline Mayotte, MD;  Location: [facility];  Service: General;  Laterality: Left;   PORT-A-CATH REMOVAL  09/2017   PORTACATH PLACEMENT Right 02/15/2017   Procedure: INSERTION PORT-A-CATH;  Surgeon: Earline Mayotte, MD;  Location: [facility];  Service: General;  Laterality: Right;   TONSILLECTOMY  1970   Social History   Social History Narrative   Not on file   Immunization History  Administered Date(s) Administered   Influenza, High Dose Seasonal PF 07/28/2017, 07/17/2018, 07/26/2019   Influenza,inj,Quad PF,6+ Mos 07/05/2013, 07/13/2014, 08/20/2015   Influenza-Unspecified  07/19/2016, 07/28/2017, 07/17/2018   PFIZER(Purple Top)SARS-COV-2 Vaccination 10/30/2019, 11/18/2019, 06/14/2020   Pneumococcal Conjugate-13 08/20/2015   Pneumococcal Polysaccharide-23 11/03/2006, 07/05/2013   Td 05/03/2002     Objective: Vital Signs: BP (!) 166/81 (BP Location: Right Arm, Patient Position: Sitting, Cuff Size: Normal)   Pulse 81   Resp 16   Ht 5\' 4"  (1.626 m)   Wt 174 lb (78.9 kg)   BMI 29.87 kg/m    Physical Exam Vitals and nursing note reviewed.  Constitutional:      Appearance: She is well-developed.  HENT:     Head: Normocephalic and atraumatic.  Eyes:     Conjunctiva/sclera: Conjunctivae normal.  Cardiovascular:     Rate and Rhythm: Normal rate and regular rhythm.     Heart sounds: Normal heart sounds.  Pulmonary:     Effort: Pulmonary effort is normal.     Breath sounds: Normal breath  sounds.  Abdominal:     General: Bowel sounds are normal.     Palpations: Abdomen is soft.  Musculoskeletal:     Cervical back: Normal range of motion.  Skin:    General: Skin is warm and dry.     Capillary Refill: Capillary refill takes less than 2 seconds.  Neurological:     Mental Status: She is alert and oriented to person, place, and time.  Psychiatric:        Behavior: Behavior normal.     Musculoskeletal Exam: C-spine has limited ROM with lateral rotation to the left.  Thoracic kyphosis noted.  No midline spinal tenderness.  No SI joint tenderness.  Shoulder joints and elbow joints have good range of motion with no discomfort.  Slightly limited flexion extension of the right wrist.  Small rheumatoid nodules on the extensor surface of both elbows noted.  Mild synovitis of the right second MCP and right third PIP joint.  PIP and DIP thickening consistent with osteoarthritis of both hands.  Complete fist formation bilaterally.  Right hip replacement has good range of motion with no groin pain.  Left hip joint has good range of motion with no groin pain.  Knee joints have good range with no warmth or effusion.  Ankle joints have good range of motion with no tenderness or joint swelling.  Some pedal edema noted bilaterally.  CDAI Exam: CDAI Score: 4.6  Patient Global: 3 mm; Provider Global: 3 mm Swollen: 2 ; Tender: 2  Joint Exam 01/15/2022      Right  Left  MCP 2  Swollen Tender     PIP 3  Swollen Tender        Investigation: No additional findings.  Imaging: No results found.  Recent Labs: Lab Results  Component Value Date   WBC 7.2 12/12/2021   HGB 13.1 12/12/2021   PLT 241 12/12/2021   NA 139 12/12/2021   K 4.3 12/12/2021   CL 106 12/12/2021   CO2 24 12/12/2021   GLUCOSE 86 12/12/2021   BUN 18 12/12/2021   CREATININE 0.73 12/12/2021   BILITOT 0.5 12/12/2021   ALKPHOS 85 01/30/2021   AST 13 12/12/2021   ALT 9 12/12/2021   PROT 6.3 12/12/2021   ALBUMIN 3.6  01/30/2021   CALCIUM 9.2 12/12/2021   GFRAA 101 11/06/2020   QFTBGOLD NEGATIVE 09/03/2017   QFTBGOLDPLUS NEGATIVE 08/15/2021    Speciality Comments: No specialty comments available.  Procedures:  No procedures performed Allergies: Adhesive [tape], Cymbalta [duloxetine hcl], Hydroxychloroquine sulfate, Ibandronate sodium, and Risedronate sodium    Assessment / Plan:  Visit Diagnoses: Seropositive rheumatoid arthritis of multiple sites (HCC) - Positive RF with erosive disease: She has mild synovitis in the right second MCP and third PIP joint.  She has noticed about an 80 to 90% improvement in her joint pain and inflammation since restarting on methotrexate as combination therapy with Enbrel after her last office visit on 11/27/2021.  She was initially started on methotrexate 6 tablets weekly for 2 weeks and then increased to 8 tablets weekly at the beginning of march.  She has had some GI upset on the increased dose of methotrexate so she is open to switching to the injectable form of methotrexate.  We will apply for Rasuvo 20 mg subcutaneous injections once weekly.  She will remain on Enbrel 50 mg sq injections once weekly and folic acid 2 mg daily.  She was advised to notify us if she cannot tolerate injectable methotrexate or has any injection site reactions.  She will return to the office in 3 months to recheck lab work and reassess her response.  High risk medication use - Enbrel SureClick 50 mg sq injections every 7 days. (discontinued SSZ in the past due to constipation).  Switching from oral to injectable methotrexate 0.8 ml sq injections once weekly.  She will remain on folic acid 2 mg daily.  CBC and CMP drawn on 12/12/2021.  CBC and CMP will be drawn today since the patient increase the dose of methotrexate 8 tablets once weekly several weeks ago.  The next lab work will be due in 3 months to monitor for drug toxicity.- Plan: CBC with Differential/Platelet, COMPLETE METABOLIC PANEL WITH  GFR TB gold negative on 08/15/21 and will continue to be monitored yearly. She has not had any recent infections. Discussed the importance of holding Enbrel and methotrexate if she develops signs or symptoms of an infection and to resume once the infection is completely cleared. Discussed the importance of yearly skin examinations while on Enbrel due to the increased risk for nonmelanoma skin cancers.   Primary osteoarthritis of both hands: She has PIP and DIP thickening consistent with osteoarthritis of both hands.  Mild synovitis of the right third PIP joint noted.  Complete fist formation bilaterally.  Discussed the importance of joint protection and muscle strengthening.  History of right hip replacement: Doing well.  She has good range of motion of the right hip replacement with no discomfort on examination today.  DDD (degenerative disc disease), cervical: She has limited range of motion without rotation to the left.  No symptoms of radiculopathy at this time.  DDD (degenerative disc disease), thoracic: Thoracic kyphosis was noted.  No midline spinal tenderness.  DDD (degenerative disc disease), lumbar: She has no midline spinal tenderness at this time.  No symptoms of radiculopathy.  Her lower back pain has been tolerable taking gabapentin as prescribed.  Fibromyalgia: She experiences intermittent myalgias and muscle tenderness due to fibromyalgia.  Overall her symptoms have been tolerable taking gabapentin as prescribed.  Age-related osteoporosis without current pathological fracture - DEXA scan on 05/06/17 showed T score of -2.4.  She has been treated with Forteo and Prolia in the past.  She is taking vitamin D 1000 units daily.  History of vitamin D deficiency: She is taking vitamin D 1000 units daily.  Other medical conditions are listed as follows:  Malignant neoplasm of upper-outer quadrant of left breast in female, estrogen receptor negative (HCC)  History of  depression  History of hypothyroidism  History of hearing loss  History of  migraine  History of anxiety  Orders: Orders Placed This Encounter  Procedures   CBC with Differential/Platelet   COMPLETE METABOLIC PANEL WITH GFR   No orders of the defined types were placed in this encounter.   Follow-Up Instructions: Return in about 3 months (around 04/17/2022) for Rheumatoid arthritis, Osteoarthritis, DDD.   Gearldine Bienenstock, PA-C  Note - This record has been created using Dragon software.  Chart creation errors have been sought, but may not always  have been located. Such creation errors do not reflect on  the standard of medical care.

## 2022-01-15 ENCOUNTER — Telehealth: Payer: Self-pay | Admitting: Pharmacist

## 2022-01-15 ENCOUNTER — Ambulatory Visit: Payer: PPO | Admitting: Physician Assistant

## 2022-01-15 ENCOUNTER — Encounter: Payer: Self-pay | Admitting: Physician Assistant

## 2022-01-15 VITALS — BP 166/81 | HR 81 | Resp 16 | Ht 64.0 in | Wt 174.0 lb

## 2022-01-15 DIAGNOSIS — Z8659 Personal history of other mental and behavioral disorders: Secondary | ICD-10-CM | POA: Diagnosis not present

## 2022-01-15 DIAGNOSIS — M5134 Other intervertebral disc degeneration, thoracic region: Secondary | ICD-10-CM | POA: Diagnosis not present

## 2022-01-15 DIAGNOSIS — Z96641 Presence of right artificial hip joint: Secondary | ICD-10-CM

## 2022-01-15 DIAGNOSIS — M810 Age-related osteoporosis without current pathological fracture: Secondary | ICD-10-CM | POA: Diagnosis not present

## 2022-01-15 DIAGNOSIS — M19041 Primary osteoarthritis, right hand: Secondary | ICD-10-CM

## 2022-01-15 DIAGNOSIS — Z171 Estrogen receptor negative status [ER-]: Secondary | ICD-10-CM

## 2022-01-15 DIAGNOSIS — Z79899 Other long term (current) drug therapy: Secondary | ICD-10-CM

## 2022-01-15 DIAGNOSIS — M51369 Other intervertebral disc degeneration, lumbar region without mention of lumbar back pain or lower extremity pain: Secondary | ICD-10-CM

## 2022-01-15 DIAGNOSIS — M0579 Rheumatoid arthritis with rheumatoid factor of multiple sites without organ or systems involvement: Secondary | ICD-10-CM

## 2022-01-15 DIAGNOSIS — M5030 Other cervical disc degeneration, unspecified cervical region: Secondary | ICD-10-CM

## 2022-01-15 DIAGNOSIS — M19042 Primary osteoarthritis, left hand: Secondary | ICD-10-CM

## 2022-01-15 DIAGNOSIS — Z8639 Personal history of other endocrine, nutritional and metabolic disease: Secondary | ICD-10-CM

## 2022-01-15 DIAGNOSIS — M5136 Other intervertebral disc degeneration, lumbar region: Secondary | ICD-10-CM | POA: Diagnosis not present

## 2022-01-15 DIAGNOSIS — C50412 Malignant neoplasm of upper-outer quadrant of left female breast: Secondary | ICD-10-CM

## 2022-01-15 DIAGNOSIS — M797 Fibromyalgia: Secondary | ICD-10-CM | POA: Diagnosis not present

## 2022-01-15 DIAGNOSIS — Z8669 Personal history of other diseases of the nervous system and sense organs: Secondary | ICD-10-CM

## 2022-01-15 NOTE — Telephone Encounter (Signed)
Please start Rasuvo BIV. ?Dose: '20mg'$  SQ weekly ? ?She has taken oral MTX and reports GI side effects. ? ?Provider and patient portion of Rasuvo PAP completed today. She will need to be called with copay once approved thrrough insurance, and if too expensive for her, she will need to be advised to bring in income documents for PAP application submission. Will place application in PAP pending info folder in pharmacy office. ? ?Knox Saliva, PharmD, MPH, BCPS ?Clinical Pharmacist (Rheumatology and Pulmonology) ?

## 2022-01-15 NOTE — Telephone Encounter (Signed)
Submitted a Prior Authorization request to  [employer]  for RASUVO via CoverMyMeds. Will update once we receive a response. ? ?Key: [facility] ? ?Knox Saliva, PharmD, MPH, BCPS ?Clinical Pharmacist (Rheumatology and Pulmonology) ?

## 2022-01-16 ENCOUNTER — Other Ambulatory Visit (HOSPITAL_COMMUNITY): Payer: Self-pay

## 2022-01-16 LAB — CBC WITH DIFFERENTIAL/PLATELET
Absolute Monocytes: 686 cells/uL (ref 200–950)
Basophils Absolute: 38 cells/uL (ref 0–200)
Basophils Relative: 0.7 %
Eosinophils Absolute: 178 cells/uL (ref 15–500)
Eosinophils Relative: 3.3 %
HCT: 41.9 % (ref 35.0–45.0)
Hemoglobin: 13.6 g/dL (ref 11.7–15.5)
Lymphs Abs: 1847 cells/uL (ref 850–3900)
MCH: 29.4 pg (ref 27.0–33.0)
MCHC: 32.5 g/dL (ref 32.0–36.0)
MCV: 90.5 fL (ref 80.0–100.0)
MPV: 11.5 fL (ref 7.5–12.5)
Monocytes Relative: 12.7 %
Neutro Abs: 2651 cells/uL (ref 1500–7800)
Neutrophils Relative %: 49.1 %
Platelets: 236 10*3/uL (ref 140–400)
RBC: 4.63 10*6/uL (ref 3.80–5.10)
RDW: 13.5 % (ref 11.0–15.0)
Total Lymphocyte: 34.2 %
WBC: 5.4 10*3/uL (ref 3.8–10.8)

## 2022-01-16 LAB — COMPLETE METABOLIC PANEL WITH GFR
AG Ratio: 1.6 (calc) (ref 1.0–2.5)
ALT: 11 U/L (ref 6–29)
AST: 14 U/L (ref 10–35)
Albumin: 3.8 g/dL (ref 3.6–5.1)
Alkaline phosphatase (APISO): 80 U/L (ref 37–153)
BUN: 18 mg/dL (ref 7–25)
CO2: 25 mmol/L (ref 20–32)
Calcium: 9.2 mg/dL (ref 8.6–10.4)
Chloride: 106 mmol/L (ref 98–110)
Creat: 0.68 mg/dL (ref 0.60–1.00)
Globulin: 2.4 g/dL (calc) (ref 1.9–3.7)
Glucose, Bld: 94 mg/dL (ref 65–99)
Potassium: 4.6 mmol/L (ref 3.5–5.3)
Sodium: 140 mmol/L (ref 135–146)
Total Bilirubin: 0.4 mg/dL (ref 0.2–1.2)
Total Protein: 6.2 g/dL (ref 6.1–8.1)
eGFR: 91 mL/min/{1.73_m2} (ref >=60)

## 2022-01-16 NOTE — Progress Notes (Signed)
CBC and CMP WNL

## 2022-01-16 NOTE — Telephone Encounter (Signed)
Received notification from  [facility]  regarding a prior authorization for RASUVO. Authorization has been APPROVED from 01/16/22 to 10/18/22.  ? ?Per test claim, copay for 28 days supply is $100 ? ?Authorization # [id number] ? ?ATC patient to determine if this monthly copay is affordable. If not, will need income documents from patient to submit with Rasuvo PAP application. Will attached PA approval letter to PAP pending info ? ?Knox Saliva, PharmD, MPH, BCPS ?Clinical Pharmacist (Rheumatology and Pulmonology) ?

## 2022-01-19 MED ORDER — RASUVO 20 MG/0.4ML ~~LOC~~ SOAJ: 20.0000 mg | SUBCUTANEOUS | 2 refills | Status: DC

## 2022-01-19 NOTE — Telephone Encounter (Signed)
Patient returned call regarding Rasuvo. She states that she is okay with paying the copay for the rest of the year and will complete Rasuvo PAP application when she is completing Enbrel PAP renewal application ? ?Rx for Rasuvo '20mg'$  SQ weekly sent to [facility]. Patient advised that she is not locked into any specific pharmacy per test claim but if [facility] is unable to fill, they will triage to appropriate pharmacy. She verbalized understanding and does have tablets on hand in case Rasuvo shipment is delayed. ? ?Knox Saliva, PharmD, MPH, BCPS ?Clinical Pharmacist (Rheumatology and Pulmonology) ?

## 2022-02-09 ENCOUNTER — Other Ambulatory Visit: Payer: Self-pay | Admitting: *Deleted

## 2022-02-09 MED ORDER — ENBREL SURECLICK 50 MG/ML ~~LOC~~ SOAJ: 50.0000 mg | SUBCUTANEOUS | 0 refills | Status: DC

## 2022-02-09 NOTE — Telephone Encounter (Signed)
Next Visit: 04/23/2022 ? ?Last Visit: 01/15/2022 ? ?Last Fill: 11/20/2021 ? ?[id number] rheumatoid arthritis of multiple sites  ? ?Current Dose per office note 06/20/4096: Enbrel SureClick 50 mg sq injections every 7 days ? ?Labs: 01/15/2022 CBC and CMP WNL ? ?TB Gold: 08/15/2021 Neg   ? ?Okay to refill Enbrel?  ?

## 2022-02-23 ENCOUNTER — Telehealth: Payer: Self-pay | Admitting: Family Medicine

## 2022-02-23 NOTE — Telephone Encounter (Signed)
Left message for patient to call back and schedule Medicare Annual Wellness Visit (AWV) to be completed by video or phone. ? ? ? ?Last AWV: 11/14/2018 ? ? ? ?Please schedule at anytime with  ?[facility]    ? ? ? ?45 minute appointment ? ? ? ?Any questions, please contact me at [phone] ?

## 2022-03-27 NOTE — Telephone Encounter (Signed)
Okay to send prescription for subcutaneous methotrexate vial.  She will take methotrexate 0.8 mL (20 mg) subcu weekly.  Please provide her with 90-day supply , 0 refills.

## 2022-03-30 MED ORDER — METHOTREXATE SODIUM CHEMO INJECTION 50 MG/2ML: 20.0000 mg | INTRAMUSCULAR | 0 refills | Status: DC

## 2022-03-30 MED ORDER — "BD TB SYRINGE 27G X 1/2"" 1 ML MISC": 12.0000 | 3 refills | Status: DC

## 2022-04-10 NOTE — Progress Notes (Deleted)
Office Visit Note  Patient: Samantha Valentine             Date of Birth: 09-28-1946           MRN: [id number]             PCP: Abner Greenspan, MD Referring: Tower, Wynelle Fanny, MD Visit Date: 04/23/2022 Occupation: '@GUAROCC'$ @  Subjective:  No chief complaint on file.   History of Present Illness: Samantha Valentine is a 76 y.o. female ***   Activities of Daily Living:  Patient reports morning stiffness for *** {minute/hour:19697}.   Patient {ACTIONS;DENIES/REPORTS:21021675::"Denies"} nocturnal pain.  Difficulty dressing/grooming: {ACTIONS;DENIES/REPORTS:21021675::"Denies"} Difficulty climbing stairs: {ACTIONS;DENIES/REPORTS:21021675::"Denies"} Difficulty getting out of chair: {ACTIONS;DENIES/REPORTS:21021675::"Denies"} Difficulty using hands for taps, buttons, cutlery, and/or writing: {ACTIONS;DENIES/REPORTS:21021675::"Denies"}  No Rheumatology ROS completed.   PMFS History:  Patient Active Problem List   Diagnosis Date Noted   History of breast cancer 12/23/2021   Elevated glucose level 11/23/2018   Routine general medical examination at a health care facility 08/09/2018   DDD (degenerative disc disease), thoracolumbar 06/28/2018   DDD (degenerative disc disease), cervical 06/28/2018   Atherosclerosis of aorta ([place]) 03/11/2018   Sleep disturbance 06/18/2017   Tachycardia 05/25/2017   Dysuria 05/25/2017   Estrogen deficiency 03/22/2017   Malignant neoplasm of upper-outer quadrant of left breast in female, estrogen receptor negative ([place]) 02/04/2017   Tendinopathy of right shoulder 01/25/2017   High risk medication use 09/29/2016   DJD (degenerative joint disease), cervical 09/29/2016   History of right hip replacement 09/29/2016   Eczema 07/13/2014   Adverse effect of immunosuppressive drug 04/27/2012   ANXIETY DEPRESSION 10/28/2008   Spinal stenosis 12/29/2007   Hypothyroidism 12/28/2007   Vitamin D deficiency 12/28/2007   HEARING LOSS 12/28/2007   Seropositive  rheumatoid arthritis of multiple sites ([place]) 12/28/2007   DDD (degenerative disc disease), lumbar 12/28/2007   Fibromyalgia 12/28/2007   Osteoporosis 12/28/2007   MIGRAINES, HX OF 12/28/2007    Past Medical History:  Diagnosis Date   Anxiety    Arthritis    RA   Breast cancer ([place]) 01/29/2017   Breast mass 1 year    Cancer ([place]) 01/29/2017   left breast INVASIVE MAMMARY CARCINOMA, ER/PR positive   Cataract 2019   resolved with surgery   Collagen vascular disease ([place])    Rhematoid Arthritis   Complication of anesthesia    DDD (degenerative disc disease)    in neck   Depression    Dyspnea    Edema    FEET/LEGS   Emphysema of lung (HCC)    Fibromyalgia    GERD (gastroesophageal reflux disease)    NO MEDS   History of hiatal hernia    History of kidney stones    MULTIPLE KIDNEY STONES BIL   HOH (hard of hearing)    AIDS   Hypothyroidism    Macular degeneration, bilateral    Migraine    Osteoporosis    Palpitations    Personal history of chemotherapy    prior to mastectomy   PONV (postoperative nausea and vomiting)    AFTER FIRST CATARACT   Rheumatoid arthritis ([place])     Family History  Problem Relation Age of Onset   Hypertension Mother    Endometrial cancer Mother    Osteoarthritis Mother    Alcohol abuse Father    Diabetes Father    Stroke Father    Liver disease Sister    Breast cancer Maternal Aunt    Breast cancer Paternal Aunt 34  Rheum arthritis Maternal Grandmother    Diabetes Paternal Grandmother    Parkinson's disease Paternal Grandfather    Past Surgical History:  Procedure Laterality Date   BREAST BIOPSY Left 01/29/2017   US guided biopsy INVASIVE MAMMARY CARCINOMA   CATARACT EXTRACTION W/PHACO Left 04/28/2018   Procedure: CATARACT EXTRACTION PHACO AND INTRAOCULAR LENS PLACEMENT ([place]);  Surgeon: Leandrew Koyanagi, MD;  Location: [facility] ORS;  Service: Ophthalmology;  Laterality: Left;  Lot # [id number] H [place]   1:03 AP%   13.4 CDE    8.40    CATARACT EXTRACTION W/PHACO Right 06/09/2018   Procedure: CATARACT EXTRACTION PHACO AND INTRAOCULAR LENS PLACEMENT (IOC);  Surgeon: Leandrew Koyanagi, MD;  Location: [facility];  Service: Ophthalmology;  Laterality: Right;  lot# [id number] h [place] 0:55 ap 16.3% cde 8.21   HIP ARTHROPLASTY     JOINT REPLACEMENT Right 2003   total hip replacement   LITHOTRIPSY  1997   kidney stone   MASTECTOMY Left 01/29/2017   MASTECTOMY W/ SENTINEL NODE BIOPSY Left 05/18/2017   Procedure: MASTECTOMY WITH SENTINEL LYMPH NODE BIOPSY;  Surgeon: Robert Bellow, MD;  Location: [facility];  Service: General;  Laterality: Left;   PORT-A-CATH REMOVAL  09/2017   PORTACATH PLACEMENT Right 02/15/2017   Procedure: INSERTION PORT-A-CATH;  Surgeon: Robert Bellow, MD;  Location: [facility];  Service: General;  Laterality: Right;   TONSILLECTOMY  1970   Social History   Social History Narrative   Not on file   Immunization History  Administered Date(s) Administered   Influenza, High Dose Seasonal PF 07/28/2017, 07/17/2018, 07/26/2019   Influenza,inj,Quad PF,6+ Mos 07/05/2013, 07/13/2014, 08/20/2015   Influenza-Unspecified 07/19/2016, 07/28/2017, 07/17/2018   PFIZER(Purple Top)SARS-COV-2 Vaccination 10/30/2019, 11/18/2019, 06/14/2020   Pneumococcal Conjugate-13 08/20/2015   Pneumococcal Polysaccharide-23 11/03/2006, 07/05/2013   Td 05/03/2002     Objective: Vital Signs: There were no vitals taken for this visit.   Physical Exam   Musculoskeletal Exam: ***  CDAI Exam: CDAI Score: -- Patient Global: --; Provider Global: -- Swollen: --; Tender: -- Joint Exam 04/23/2022   No joint exam has been documented for this visit   There is currently no information documented on the homunculus. Go to the Rheumatology activity and complete the homunculus joint exam.  Investigation: No additional findings.  Imaging: No results found.  Recent Labs: Lab Results  Component Value Date   WBC 5.4 01/15/2022   HGB  13.6 01/15/2022   PLT 236 01/15/2022   NA 140 01/15/2022   K 4.6 01/15/2022   CL 106 01/15/2022   CO2 25 01/15/2022   GLUCOSE 94 01/15/2022   BUN 18 01/15/2022   CREATININE 0.68 01/15/2022   BILITOT 0.4 01/15/2022   ALKPHOS 85 01/30/2021   AST 14 01/15/2022   ALT 11 01/15/2022   PROT 6.2 01/15/2022   ALBUMIN 3.6 01/30/2021   CALCIUM 9.2 01/15/2022   GFRAA 101 11/06/2020   QFTBGOLD NEGATIVE 09/03/2017   QFTBGOLDPLUS NEGATIVE 08/15/2021    Speciality Comments: No specialty comments available.  Procedures:  No procedures performed Allergies: Adhesive [tape], Cymbalta [duloxetine hcl], Hydroxychloroquine sulfate, Ibandronate sodium, and Risedronate sodium   Assessment / Plan:     Visit Diagnoses: Seropositive rheumatoid arthritis of multiple sites ([place])  High risk medication use  Primary osteoarthritis of both hands  History of right hip replacement  DDD (degenerative disc disease), cervical  DDD (degenerative disc disease), thoracic  DDD (degenerative disc disease), lumbar  Fibromyalgia  Age-related osteoporosis without current pathological fracture  History of vitamin D deficiency  Malignant  neoplasm of upper-outer quadrant of left breast in female, estrogen receptor negative ([place])  History of depression  History of hearing loss  History of hypothyroidism  History of anxiety  History of migraine  Orders: No orders of the defined types were placed in this encounter.  No orders of the defined types were placed in this encounter.   Face-to-face time spent with patient was *** minutes. Greater than 50% of time was spent in counseling and coordination of care.  Follow-Up Instructions: No follow-ups on file.   Bo Merino, MD  Note - This record has been created using Editor, commissioning.  Chart creation errors have been sought, but may not always  have been located. Such creation errors do not reflect on  the standard of medical care.

## 2022-04-14 ENCOUNTER — Telehealth: Payer: Self-pay | Admitting: Family Medicine

## 2022-04-14 NOTE — Telephone Encounter (Signed)
Left message for patient to call back and schedule Medicare Annual Wellness Visit (AWV) either virtually or phone   Last AWV 11/14/18   I left my direct # [phone]

## 2022-04-14 NOTE — Progress Notes (Signed)
Office Visit Note  Patient: Samantha Valentine             Date of Birth: Mar 16, 1946           MRN: [id number]             PCP: Abner Greenspan, MD Referring: Tower, Wynelle Fanny, MD Visit Date: 04/15/2022 Occupation: '@GUAROCC'$ @  Subjective: Medication management  History of Present Illness: Tawonda Anaka Beazer is a 76 y.o. female with history of seropositive rheumatoid arthritis and osteoarthritis.  She was started on methotrexate 20 mg subcu weekly after her last visit on January 15, 2022.  She has noticed improvement in her symptoms since she has been on combination therapy.  She has been taking Enbrel 50 mg subcu weekly.  She still has some discomfort in her bilateral ankles.  None of the other joints are as painful.  Right total hip replacement is doing well.  She continues to have discomfort in her and to her spine.  She continues to have some generalized pain from fibromyalgia.  Activities of Daily Living:  Patient reports morning stiffness for 24 hours.   Patient Reports nocturnal pain.  Difficulty dressing/grooming: Reports Difficulty climbing stairs: Reports Difficulty getting out of chair: Reports Difficulty using hands for taps, buttons, cutlery, and/or writing: Reports  Review of Systems  Constitutional:  Positive for fatigue.  HENT:  Positive for hearing loss and mouth dryness.   Eyes:  Positive for dryness.  Respiratory: Negative.  Negative for shortness of breath.   Cardiovascular: Negative.  Negative for chest pain and palpitations.  Gastrointestinal: Negative.  Negative for blood in stool and constipation.  Endocrine: Negative.  Negative for increased urination.  Genitourinary: Negative.  Negative for difficulty urinating.       Passed kidney stone in May, has had many   Musculoskeletal:  Positive for joint pain, joint pain and morning stiffness.  Skin:  Negative for rash and sensitivity to sunlight.  Allergic/Immunologic: Negative for susceptible to infections.        Denies recent infections  Neurological:  Negative for dizziness.  Hematological: Negative.  Negative for swollen glands.  Psychiatric/Behavioral: Negative.  Negative for depressed mood and sleep disturbance. The patient is not nervous/anxious.     PMFS History:  Patient Active Problem List   Diagnosis Date Noted   History of breast cancer 12/23/2021   Elevated glucose level 11/23/2018   Routine general medical examination at a health care facility 08/09/2018   DDD (degenerative disc disease), thoracolumbar 06/28/2018   DDD (degenerative disc disease), cervical 06/28/2018   Atherosclerosis of aorta ([place]) 03/11/2018   Sleep disturbance 06/18/2017   Tachycardia 05/25/2017   Estrogen deficiency 03/22/2017   Malignant neoplasm of upper-outer quadrant of left breast in female, estrogen receptor negative ([place]) 02/04/2017   Tendinopathy of right shoulder 01/25/2017   High risk medication use 09/29/2016   DJD (degenerative joint disease), cervical 09/29/2016   History of right hip replacement 09/29/2016   Eczema 07/13/2014   Adverse effect of immunosuppressive drug 04/27/2012   ANXIETY DEPRESSION 10/28/2008   Spinal stenosis 12/29/2007   Hypothyroidism 12/28/2007   Vitamin D deficiency 12/28/2007   HEARING LOSS 12/28/2007   Seropositive rheumatoid arthritis of multiple sites ([place]) 12/28/2007   DDD (degenerative disc disease), lumbar 12/28/2007   Fibromyalgia 12/28/2007   Osteoporosis 12/28/2007   MIGRAINES, HX OF 12/28/2007    Past Medical History:  Diagnosis Date   Anxiety    Arthritis    RA   Breast cancer (  [place]) 01/29/2017   Breast mass 1 year    Cancer ([place]) 01/29/2017   left breast INVASIVE MAMMARY CARCINOMA, ER/PR positive   Cataract 2019   resolved with surgery   Collagen vascular disease ([place])    Rhematoid Arthritis   Complication of anesthesia    DDD (degenerative disc disease)    in neck   Depression    Dyspnea    Edema    FEET/LEGS   Emphysema of lung ([facility])     Fibromyalgia    GERD (gastroesophageal reflux disease)    NO MEDS   History of hiatal hernia    History of kidney stones    MULTIPLE KIDNEY STONES BIL   HOH (hard of hearing)    AIDS   Hypothyroidism    Macular degeneration, bilateral    Migraine    Osteoporosis    Palpitations    Personal history of chemotherapy    prior to mastectomy   PONV (postoperative nausea and vomiting)    AFTER FIRST CATARACT   Rheumatoid arthritis ([place])     Family History  Problem Relation Age of Onset   Hypertension Mother    Endometrial cancer Mother    Osteoarthritis Mother    Alcohol abuse Father    Diabetes Father    Stroke Father    Liver disease Sister    Breast cancer Maternal Aunt    Breast cancer Paternal Aunt 73   Rheum arthritis Maternal Grandmother    Diabetes Paternal Grandmother    Parkinson's disease Paternal Grandfather    Past Surgical History:  Procedure Laterality Date   BREAST BIOPSY Left 01/29/2017   US guided biopsy INVASIVE MAMMARY CARCINOMA   CATARACT EXTRACTION W/PHACO Left 04/28/2018   Procedure: CATARACT EXTRACTION PHACO AND INTRAOCULAR LENS PLACEMENT ([place]);  Surgeon: Leandrew Koyanagi, MD;  Location: [facility];  Service: Ophthalmology;  Laterality: Left;  Lot # [id number] H [place]   1:03 AP%   13.4 CDE    8.40   CATARACT EXTRACTION W/PHACO Right 06/09/2018   Procedure: CATARACT EXTRACTION PHACO AND INTRAOCULAR LENS PLACEMENT (IOC);  Surgeon: Leandrew Koyanagi, MD;  Location: [facility];  Service: Ophthalmology;  Laterality: Right;  lot# [id number] h [place] 0:55 ap 16.3% cde 8.21   HIP ARTHROPLASTY     JOINT REPLACEMENT Right 2003   total hip replacement   LITHOTRIPSY  1997   kidney stone   MASTECTOMY Left 01/29/2017   MASTECTOMY W/ SENTINEL NODE BIOPSY Left 05/18/2017   Procedure: MASTECTOMY WITH SENTINEL LYMPH NODE BIOPSY;  Surgeon: Robert Bellow, MD;  Location: [facility];  Service: General;  Laterality: Left;   PORT-A-CATH REMOVAL  09/2017   PORTACATH PLACEMENT  Right 02/15/2017   Procedure: INSERTION PORT-A-CATH;  Surgeon: Robert Bellow, MD;  Location: [facility];  Service: General;  Laterality: Right;   TONSILLECTOMY  1970   Social History   Social History Narrative   Not on file   Immunization History  Administered Date(s) Administered   Influenza, High Dose Seasonal PF 07/28/2017, 07/17/2018, 07/26/2019   Influenza,inj,Quad PF,6+ Mos 07/05/2013, 07/13/2014, 08/20/2015   Influenza-Unspecified 07/19/2016, 07/28/2017, 07/17/2018   PFIZER(Purple Top)SARS-COV-2 Vaccination 10/30/2019, 11/18/2019, 06/14/2020   Pneumococcal Conjugate-13 08/20/2015   Pneumococcal Polysaccharide-23 11/03/2006, 07/05/2013   Td 05/03/2002     Objective: Vital Signs: BP (!) 146/86   Pulse 65   Ht '5\' 4"'$  (1.626 m)   Wt 173 lb (78.5 kg)   BMI 29.70 kg/m    Physical Exam Vitals and nursing note reviewed.  Constitutional:  Appearance: She is well-developed.  HENT:     Head: Normocephalic and atraumatic.  Eyes:     Conjunctiva/sclera: Conjunctivae normal.  Cardiovascular:     Rate and Rhythm: Normal rate and regular rhythm.     Heart sounds: Normal heart sounds.  Pulmonary:     Effort: Pulmonary effort is normal.     Breath sounds: Normal breath sounds.  Abdominal:     General: Bowel sounds are normal.     Palpations: Abdomen is soft.  Musculoskeletal:     Cervical back: Normal range of motion.  Lymphadenopathy:     Cervical: No cervical adenopathy.  Skin:    General: Skin is warm and dry.     Capillary Refill: Capillary refill takes less than 2 seconds.  Neurological:     Mental Status: She is alert and oriented to person, place, and time.  Psychiatric:        Behavior: Behavior normal.      Musculoskeletal Exam: She had limited lateral rotation.  Shoulder joints, elbow joints, wrist joints, MCPs PIPs and DIPs with good range of motion.  She has synovial thickening over MCP joints and PIP and DIP joints with no synovitis.  Hip joints and  knee joints with good range of motion.  She had pedal edema but no tenderness or synovitis over ankle joints.  There was no tenderness over MTPs.  CDAI Exam: CDAI Score: 0.8  Patient Global: 5 mm; Provider Global: 3 mm Swollen: 0 ; Tender: 0  Joint Exam 04/15/2022   No joint exam has been documented for this visit   There is currently no information documented on the homunculus. Go to the Rheumatology activity and complete the homunculus joint exam.  Investigation: No additional findings.  Imaging: No results found.  Recent Labs: Lab Results  Component Value Date   WBC 5.4 01/15/2022   HGB 13.6 01/15/2022   PLT 236 01/15/2022   NA 140 01/15/2022   K 4.6 01/15/2022   CL 106 01/15/2022   CO2 25 01/15/2022   GLUCOSE 94 01/15/2022   BUN 18 01/15/2022   CREATININE 0.68 01/15/2022   BILITOT 0.4 01/15/2022   ALKPHOS 85 01/30/2021   AST 14 01/15/2022   ALT 11 01/15/2022   PROT 6.2 01/15/2022   ALBUMIN 3.6 01/30/2021   CALCIUM 9.2 01/15/2022   GFRAA 101 11/06/2020   QFTBGOLD NEGATIVE 09/03/2017   QFTBGOLDPLUS NEGATIVE 08/15/2021    Speciality Comments: No specialty comments available.  Procedures:  No procedures performed Allergies: Adhesive [tape], Cymbalta [duloxetine hcl], Hydroxychloroquine sulfate, Ibandronate sodium, and Risedronate sodium   Assessment / Plan:     Visit Diagnoses: Seropositive rheumatoid arthritis of multiple sites ([place]) - +RF, erosive disease.  Patient is doing better on the combination of Enbrel and methotrexate.  Subcutaneous methotrexate was restarted a March 2023.  She continues to have some stiffness in her joints and also complains of discomfort in her ankle joints.  No synovitis was noted.  She has synovial thickening over MCP joints.  Pitting pedal edema was noted over bilateral ankles.  High risk medication use - Enbrel SureClick 50 mg sq injections every 7 days. (discontinued SSZ in the past due to constipation).  Switching from oral to  injectable methotrexate 0.8 ml sq Labs obtained on January 15, 2022 were reviewed which were within normal limits.  TB gold was negative on February 11, 2022.- Plan: CBC with Differential/Platelet, COMPLETE METABOLIC PANEL WITH GFR, QuantiFERON-TB Gold Plus today and then every 3 months.  Information  regarding immunization was placed in the AVS.  She was also advised to hold methotrexate and Enbrel if she develops an infection and resume after the infection resolves.  Primary osteoarthritis of both hands-she has severe osteoarthritis in her hands which causes discomfort.  History of right hip replacement-she had good range of motion without discomfort.  Pain in joint involving ankle and foot, unspecified laterality-she complains of pain and discomfort in her bilateral ankle joints.  No warmth swelling or effusion was noted.  Pitting edema was noted.  DDD (degenerative disc disease), cervical-chronic pain.  She has some limitation with lateral rotation.  DDD (degenerative disc disease), thoracic-she has thoracic kyphosis without point tenderness.  DDD (degenerative disc disease), lumbar-she complains of chronic discomfort.  There was no point tenderness.  Fibromyalgia-she continues to have generalized pain and discomfort.  She had positive tender points.  Age-related osteoporosis without current pathological fracture - Subcutaneous May 06, 2017 T score -2.4.  Patient was treated with Forteo and Prolia in the past.  She has been taking vitamin D 1000 units daily.  She does not wish to have repeat DEXA scan in future treatment.  History of vitamin D deficiency-use of vitamin D supplement was discussed.  Other medical problems are listed as follows:  Malignant neoplasm of upper-outer quadrant of left breast in female, estrogen receptor negative ([place])  History of hypothyroidism  History of hearing loss  History of depression  History of anxiety  History of migraine  Orders: Orders Placed  This Encounter  Procedures   CBC with Differential/Platelet   COMPLETE METABOLIC PANEL WITH GFR   QuantiFERON-TB Gold Plus   Meds ordered this encounter  Medications   etanercept (ENBREL SURECLICK) 50 MG/ML injection    Sig: Inject 50 mg into the skin once a week.    Dispense:  12 mL    Refill:  0    Follow-Up Instructions: Return in about 5 months (around 09/15/2022) for Rheumatoid arthritis.   Bo Merino, MD  Note - This record has been created using Editor, commissioning.  Chart creation errors have been sought, but may not always  have been located. Such creation errors do not reflect on  the standard of medical care.

## 2022-04-15 ENCOUNTER — Encounter: Payer: Self-pay | Admitting: Rheumatology

## 2022-04-15 ENCOUNTER — Ambulatory Visit: Payer: PPO | Admitting: Rheumatology

## 2022-04-15 VITALS — BP 146/86 | HR 65 | Ht 64.0 in | Wt 173.0 lb

## 2022-04-15 DIAGNOSIS — M5134 Other intervertebral disc degeneration, thoracic region: Secondary | ICD-10-CM | POA: Diagnosis not present

## 2022-04-15 DIAGNOSIS — M797 Fibromyalgia: Secondary | ICD-10-CM

## 2022-04-15 DIAGNOSIS — Z8639 Personal history of other endocrine, nutritional and metabolic disease: Secondary | ICD-10-CM

## 2022-04-15 DIAGNOSIS — Z171 Estrogen receptor negative status [ER-]: Secondary | ICD-10-CM

## 2022-04-15 DIAGNOSIS — Z8669 Personal history of other diseases of the nervous system and sense organs: Secondary | ICD-10-CM

## 2022-04-15 DIAGNOSIS — Z79899 Other long term (current) drug therapy: Secondary | ICD-10-CM

## 2022-04-15 DIAGNOSIS — M51369 Other intervertebral disc degeneration, lumbar region without mention of lumbar back pain or lower extremity pain: Secondary | ICD-10-CM

## 2022-04-15 DIAGNOSIS — M5030 Other cervical disc degeneration, unspecified cervical region: Secondary | ICD-10-CM | POA: Diagnosis not present

## 2022-04-15 DIAGNOSIS — M5136 Other intervertebral disc degeneration, lumbar region: Secondary | ICD-10-CM | POA: Diagnosis not present

## 2022-04-15 DIAGNOSIS — M25579 Pain in unspecified ankle and joints of unspecified foot: Secondary | ICD-10-CM | POA: Diagnosis not present

## 2022-04-15 DIAGNOSIS — M0579 Rheumatoid arthritis with rheumatoid factor of multiple sites without organ or systems involvement: Secondary | ICD-10-CM | POA: Diagnosis not present

## 2022-04-15 DIAGNOSIS — C50412 Malignant neoplasm of upper-outer quadrant of left female breast: Secondary | ICD-10-CM | POA: Diagnosis not present

## 2022-04-15 DIAGNOSIS — M810 Age-related osteoporosis without current pathological fracture: Secondary | ICD-10-CM | POA: Diagnosis not present

## 2022-04-15 DIAGNOSIS — Z8659 Personal history of other mental and behavioral disorders: Secondary | ICD-10-CM

## 2022-04-15 DIAGNOSIS — M19041 Primary osteoarthritis, right hand: Secondary | ICD-10-CM | POA: Diagnosis not present

## 2022-04-15 DIAGNOSIS — Z96641 Presence of right artificial hip joint: Secondary | ICD-10-CM

## 2022-04-15 DIAGNOSIS — M19042 Primary osteoarthritis, left hand: Secondary | ICD-10-CM

## 2022-04-15 MED ORDER — ENBREL SURECLICK 50 MG/ML ~~LOC~~ SOAJ: 50.0000 mg | SUBCUTANEOUS | 0 refills | Status: DC

## 2022-04-15 NOTE — Patient Instructions (Addendum)
Standing Labs We placed an order today for your standing lab work.   Please have your standing labs drawn in September and every 3 months TB Gold with next labs  If possible, please have your labs drawn 2 weeks prior to your appointment so that the provider can discuss your results at your appointment.  Please note that you may see your imaging and lab results in [place] before we have reviewed them. We may be awaiting multiple results to interpret others before contacting you. Please allow our office up to 72 hours to thoroughly review all of the results before contacting the office for clarification of your results.  We have open lab daily: Monday through Thursday from 1:30-4:30 PM and Friday from 1:30-4:00 PM at the office of Dr. Bo Merino, [facility].   Please be advised, all patients with office appointments requiring lab work will take precedent over walk-in lab work.  If possible, please come for your lab work on Monday and Friday afternoons, as you may experience shorter wait times. The office is located at [address], [place], [place], [place] [address] No appointment is necessary.   Labs are drawn by [facility]. Please bring your co-pay at the time of your lab draw.  You may receive a bill from [facility] for your lab work.  Please note if you are on Hydroxychloroquine and and an order has been placed for a Hydroxychloroquine level, you will need to have it drawn 4 hours or more after your last dose.  If you wish to have your labs drawn at another location, please call the office 24 hours in advance to send orders.  If you have any questions regarding directions or hours of operation,  please call [phone].   As a reminder, please drink plenty of water prior to coming for your lab work. Thanks!   Vaccines You are taking a medication(s) that can suppress your immune system.  The following immunizations are recommended: Flu annually Covid-19  Td/Tdap  (tetanus, diphtheria, pertussis) every 10 years Pneumonia (Prevnar 15 then Pneumovax 23 at least 1 year apart.  Alternatively, can take Prevnar 20 without needing additional dose) Shingrix: 2 doses from 4 weeks to 6 months apart  Please check with your PCP to make sure you are up to date.   If you have signs or symptoms of an infection or start antibiotics: First, call your PCP for workup of your infection. Hold your medication through the infection, until you complete your antibiotics, and until symptoms resolve if you take the following: Injectable medication (Actemra, Benlysta, Cimzia, Cosentyx, Enbrel, Humira, Kevzara, Orencia, Remicade, Simponi, Stelara, Taltz, Tremfya) Methotrexate Leflunomide (Arava) Mycophenolate (Cellcept) Roma Kayser, or Rinvoq  Please get an annual skin examination by your dermatologist to screen for skin cancer while you are on Enbrel

## 2022-04-16 LAB — COMPLETE METABOLIC PANEL WITH GFR
AG Ratio: 1.6 (calc) (ref 1.0–2.5)
ALT: 12 U/L (ref 6–29)
AST: 12 U/L (ref 10–35)
Albumin: 3.8 g/dL (ref 3.6–5.1)
Alkaline phosphatase (APISO): 86 U/L (ref 37–153)
BUN: 15 mg/dL (ref 7–25)
CO2: 25 mmol/L (ref 20–32)
Calcium: 9 mg/dL (ref 8.6–10.4)
Chloride: 106 mmol/L (ref 98–110)
Creat: 0.64 mg/dL (ref 0.60–1.00)
Globulin: 2.4 g/dL (calc) (ref 1.9–3.7)
Glucose, Bld: 93 mg/dL (ref 65–99)
Potassium: 4.5 mmol/L (ref 3.5–5.3)
Sodium: 141 mmol/L (ref 135–146)
Total Bilirubin: 0.4 mg/dL (ref 0.2–1.2)
Total Protein: 6.2 g/dL (ref 6.1–8.1)
eGFR: 93 mL/min/{1.73_m2} (ref >=60)

## 2022-04-16 LAB — CBC WITH DIFFERENTIAL/PLATELET
Absolute Monocytes: 618 cells/uL (ref 200–950)
Basophils Absolute: 52 cells/uL (ref 0–200)
Basophils Relative: 0.8 %
Eosinophils Absolute: 202 cells/uL (ref 15–500)
Eosinophils Relative: 3.1 %
HCT: 40.8 % (ref 35.0–45.0)
Hemoglobin: 13.1 g/dL (ref 11.7–15.5)
Lymphs Abs: 1859 cells/uL (ref 850–3900)
MCH: 29.8 pg (ref 27.0–33.0)
MCHC: 32.1 g/dL (ref 32.0–36.0)
MCV: 92.9 fL (ref 80.0–100.0)
MPV: 11.5 fL (ref 7.5–12.5)
Monocytes Relative: 9.5 %
Neutro Abs: 3770 cells/uL (ref 1500–7800)
Neutrophils Relative %: 58 %
Platelets: 280 10*3/uL (ref 140–400)
RBC: 4.39 10*6/uL (ref 3.80–5.10)
RDW: 14.1 % (ref 11.0–15.0)
Total Lymphocyte: 28.6 %
WBC: 6.5 10*3/uL (ref 3.8–10.8)

## 2022-04-16 NOTE — Progress Notes (Signed)
CBC and CMP normal

## 2022-04-23 ENCOUNTER — Ambulatory Visit: Payer: PPO | Admitting: Rheumatology

## 2022-04-23 DIAGNOSIS — Z96641 Presence of right artificial hip joint: Secondary | ICD-10-CM

## 2022-04-23 DIAGNOSIS — M5134 Other intervertebral disc degeneration, thoracic region: Secondary | ICD-10-CM

## 2022-04-23 DIAGNOSIS — M5136 Other intervertebral disc degeneration, lumbar region: Secondary | ICD-10-CM

## 2022-04-23 DIAGNOSIS — C50412 Malignant neoplasm of upper-outer quadrant of left female breast: Secondary | ICD-10-CM

## 2022-04-23 DIAGNOSIS — M0579 Rheumatoid arthritis with rheumatoid factor of multiple sites without organ or systems involvement: Secondary | ICD-10-CM

## 2022-04-23 DIAGNOSIS — Z8659 Personal history of other mental and behavioral disorders: Secondary | ICD-10-CM

## 2022-04-23 DIAGNOSIS — Z8639 Personal history of other endocrine, nutritional and metabolic disease: Secondary | ICD-10-CM

## 2022-04-23 DIAGNOSIS — M810 Age-related osteoporosis without current pathological fracture: Secondary | ICD-10-CM

## 2022-04-23 DIAGNOSIS — M19041 Primary osteoarthritis, right hand: Secondary | ICD-10-CM

## 2022-04-23 DIAGNOSIS — M5030 Other cervical disc degeneration, unspecified cervical region: Secondary | ICD-10-CM

## 2022-04-23 DIAGNOSIS — Z8669 Personal history of other diseases of the nervous system and sense organs: Secondary | ICD-10-CM

## 2022-04-23 DIAGNOSIS — M797 Fibromyalgia: Secondary | ICD-10-CM

## 2022-04-23 DIAGNOSIS — Z79899 Other long term (current) drug therapy: Secondary | ICD-10-CM

## 2022-05-29 ENCOUNTER — Other Ambulatory Visit: Payer: Self-pay | Admitting: Rheumatology

## 2022-05-29 NOTE — Telephone Encounter (Signed)
Next Visit: 09/16/2022  Last Visit: 04/15/2022  Last Fill: 03/30/2022  DX: Seropositive rheumatoid arthritis of multiple sites   Current Dose per office note 04/15/2022: methotrexate 0.8 ml sq  Labs: 04/15/2022 CBC and CMP normal.  Okay to refill MTX?

## 2022-06-25 DIAGNOSIS — H353132 Nonexudative age-related macular degeneration, bilateral, intermediate dry stage: Secondary | ICD-10-CM | POA: Diagnosis not present

## 2022-07-10 ENCOUNTER — Other Ambulatory Visit: Payer: Self-pay | Admitting: *Deleted

## 2022-07-10 DIAGNOSIS — Z79899 Other long term (current) drug therapy: Secondary | ICD-10-CM

## 2022-07-11 LAB — COMPLETE METABOLIC PANEL WITH GFR
AG Ratio: 1.6 (calc) (ref 1.0–2.5)
ALT: 12 U/L (ref 6–29)
AST: 14 U/L (ref 10–35)
Albumin: 3.7 g/dL (ref 3.6–5.1)
Alkaline phosphatase (APISO): 87 U/L (ref 37–153)
BUN: 16 mg/dL (ref 7–25)
CO2: 27 mmol/L (ref 20–32)
Calcium: 9.2 mg/dL (ref 8.6–10.4)
Chloride: 105 mmol/L (ref 98–110)
Creat: 0.65 mg/dL (ref 0.60–1.00)
Globulin: 2.3 g/dL (calc) (ref 1.9–3.7)
Glucose, Bld: 91 mg/dL (ref 65–99)
Potassium: 4.8 mmol/L (ref 3.5–5.3)
Sodium: 139 mmol/L (ref 135–146)
Total Bilirubin: 0.3 mg/dL (ref 0.2–1.2)
Total Protein: 6 g/dL — ABNORMAL LOW (ref 6.1–8.1)
eGFR: 92 mL/min/{1.73_m2} (ref >=60)

## 2022-07-11 LAB — CBC WITH DIFFERENTIAL/PLATELET
Absolute Monocytes: 812 cells/uL (ref 200–950)
Basophils Absolute: 50 cells/uL (ref 0–200)
Basophils Relative: 0.8 %
Eosinophils Absolute: 229 cells/uL (ref 15–500)
Eosinophils Relative: 3.7 %
HCT: 38.2 % (ref 35.0–45.0)
Hemoglobin: 12.2 g/dL (ref 11.7–15.5)
Lymphs Abs: 1767 cells/uL (ref 850–3900)
MCH: 28.5 pg (ref 27.0–33.0)
MCHC: 31.9 g/dL — ABNORMAL LOW (ref 32.0–36.0)
MCV: 89.3 fL (ref 80.0–100.0)
MPV: 11.8 fL (ref 7.5–12.5)
Monocytes Relative: 13.1 %
Neutro Abs: 3342 cells/uL (ref 1500–7800)
Neutrophils Relative %: 53.9 %
Platelets: 252 10*3/uL (ref 140–400)
RBC: 4.28 10*6/uL (ref 3.80–5.10)
RDW: 15.2 % — ABNORMAL HIGH (ref 11.0–15.0)
Total Lymphocyte: 28.5 %
WBC: 6.2 10*3/uL (ref 3.8–10.8)

## 2022-07-13 NOTE — Progress Notes (Signed)
CBC and CMP are normal.

## 2022-07-21 ENCOUNTER — Other Ambulatory Visit: Payer: Self-pay | Admitting: Rheumatology

## 2022-07-21 MED ORDER — ENBREL SURECLICK 50 MG/ML ~~LOC~~ SOAJ: 50.0000 mg | SUBCUTANEOUS | 0 refills | Status: DC

## 2022-07-21 NOTE — Telephone Encounter (Signed)
Next Visit: 09/16/2022   Last Visit: 04/15/2022   Last Fill: 04/15/2022  DX: Seropositive rheumatoid arthritis of multiple sites    Current Dose per office note 0/76/2263: Enbrel SureClick 50 mg sq injections every 7 days  Labs: 07/10/2022 CBC and CMP are normal.  TB Gold: 08/15/2021 Neg   Okay to refill Enbrel?

## 2022-08-18 ENCOUNTER — Telehealth: Payer: Self-pay | Admitting: Family Medicine

## 2022-08-18 ENCOUNTER — Other Ambulatory Visit: Payer: Self-pay | Admitting: Rheumatology

## 2022-08-18 MED ORDER — METHOTREXATE SODIUM CHEMO INJECTION 250 MG/10ML: 20.0000 mg | INTRAMUSCULAR | 0 refills | Status: DC

## 2022-08-18 NOTE — Telephone Encounter (Signed)
Next Visit: 09/16/2022   Last Visit: 04/15/2022   Last Fill: 05/29/2022   DX: Seropositive rheumatoid arthritis of multiple sites    Current Dose per office note 04/15/2022: methotrexate 0.8 ml sq   Labs: 07/10/2022 CBC and CMP are normal.    Okay to refill MTX?

## 2022-08-18 NOTE — Telephone Encounter (Signed)
Patient declined the Medicare Wellness Visit with [facility]  - Does not feel it is necessary and does not want to be contacted to schedule

## 2022-08-31 ENCOUNTER — Telehealth: Payer: Self-pay | Admitting: Pharmacist

## 2022-08-31 NOTE — Telephone Encounter (Signed)
Received Enbrel PAP renewal application for [employer] from patient. Provider portion placed in Dr. Arlean Hopping folder for signature  Knox Saliva, PharmD, MPH, BCPS, CPP Clinical Pharmacist (Rheumatology and Pulmonology)

## 2022-09-02 NOTE — Progress Notes (Signed)
Office Visit Note  Patient: Samantha Valentine             Date of Birth: 09-Apr-1946           MRN: [id number]             PCP: Abner Greenspan, MD Referring: Tower, Wynelle Fanny, MD Visit Date: 09/16/2022 Occupation: '@GUAROCC'$ @  Subjective:  Medication monitoring  History of Present Illness: Samantha Valentine is a 76 y.o. female with history of seropositive rheumatoid arthritis, DDD, and fibromyalgia.  She remains on Enbrel 50 mg sq injections once weekly, methotrexate 0.8 ml sq injections once weekly and folic acid 2 mg daily.  She has been tolerating Enbrel and methotrexate without any side effects and has not missed any doses recently.  She denies any signs or symptoms of a rheumatoid arthritis flare.  She continues to experience arthralgias and joint stiffness especially with weather changes.  Her discomfort is most severe in her lower back.  She takes Celebrex for pain relief.  She requested a refill of Celebrex to be sent to the pharmacy today.  She experiences intermittent discomfort in her ankle joints and has noticed some increased fluid retention recently.  She has not yet tried wearing compression stockings.  She states she has noticed a mild rash on her lower legs which is itchy at times.  She states in the past she has been diagnosed with skin candidal infections and has used antifungal agents in the past which have been helpful.  She has not yet tried any topical agents.  She has not followed by dermatology at this time.     Activities of Daily Living:  Patient reports morning stiffness for few minutes  Patient Reports nocturnal pain.  Difficulty dressing/grooming: Denies Difficulty climbing stairs: Reports Difficulty getting out of chair: Reports Difficulty using hands for taps, buttons, cutlery, and/or writing: Reports  Review of Systems  Constitutional:  Positive for fatigue.  HENT:  Positive for mouth dryness. Negative for mouth sores and nose dryness.   Eyes:  Positive  for dryness. Negative for pain and visual disturbance.  Respiratory:  Positive for cough. Negative for hemoptysis, shortness of breath and difficulty breathing.   Cardiovascular:  Negative for chest pain, palpitations, hypertension and swelling in legs/feet.  Gastrointestinal:  Negative for blood in stool, constipation and diarrhea.  Endocrine: Negative for increased urination.  Genitourinary:  Negative for painful urination.  Musculoskeletal:  Positive for joint pain, joint pain, myalgias, morning stiffness, muscle tenderness and myalgias. Negative for joint swelling and muscle weakness.  Skin:  Positive for rash. Negative for color change, pallor, hair loss, nodules/bumps, skin tightness, ulcers and sensitivity to sunlight.  Allergic/Immunologic: Negative for susceptible to infections.  Neurological:  Positive for headaches. Negative for dizziness, numbness and weakness.  Hematological:  Negative for swollen glands.  Psychiatric/Behavioral:  Positive for sleep disturbance. Negative for depressed mood. The patient is nervous/anxious.     PMFS History:  Patient Active Problem List   Diagnosis Date Noted   History of breast cancer 12/23/2021   Elevated glucose level 11/23/2018   Routine general medical examination at a health care facility 08/09/2018   DDD (degenerative disc disease), thoracolumbar 06/28/2018   DDD (degenerative disc disease), cervical 06/28/2018   Atherosclerosis of aorta ([place]) 03/11/2018   Sleep disturbance 06/18/2017   Tachycardia 05/25/2017   Estrogen deficiency 03/22/2017   Malignant neoplasm of upper-outer quadrant of left breast in female, estrogen receptor negative ([place]) 02/04/2017   Tendinopathy of right  shoulder 01/25/2017   High risk medication use 09/29/2016   DJD (degenerative joint disease), cervical 09/29/2016   History of right hip replacement 09/29/2016   Eczema 07/13/2014   Adverse effect of immunosuppressive drug 04/27/2012   ANXIETY DEPRESSION  10/28/2008   Spinal stenosis 12/29/2007   Hypothyroidism 12/28/2007   Vitamin D deficiency 12/28/2007   HEARING LOSS 12/28/2007   Seropositive rheumatoid arthritis of multiple sites ([place]) 12/28/2007   DDD (degenerative disc disease), lumbar 12/28/2007   Fibromyalgia 12/28/2007   Osteoporosis 12/28/2007   MIGRAINES, HX OF 12/28/2007    Past Medical History:  Diagnosis Date   Anxiety    Arthritis    RA   Breast cancer ([place]) 01/29/2017   Breast mass 1 year    Cancer ([place]) 01/29/2017   left breast INVASIVE MAMMARY CARCINOMA, ER/PR positive   Cataract 2019   resolved with surgery   Collagen vascular disease ([place])    Rhematoid Arthritis   Complication of anesthesia    DDD (degenerative disc disease)    in neck   Depression    Dyspnea    Edema    FEET/LEGS   Emphysema of lung ([facility])    Fibromyalgia    GERD (gastroesophageal reflux disease)    NO MEDS   History of hiatal hernia    History of kidney stones    MULTIPLE KIDNEY STONES BIL   HOH (hard of hearing)    AIDS   Hypothyroidism    Macular degeneration, bilateral    Migraine    Osteoporosis    Palpitations    Personal history of chemotherapy    prior to mastectomy   PONV (postoperative nausea and vomiting)    AFTER FIRST CATARACT   Rheumatoid arthritis ([place])     Family History  Problem Relation Age of Onset   Hypertension Mother    Endometrial cancer Mother    Osteoarthritis Mother    Alcohol abuse Father    Diabetes Father    Stroke Father    Liver disease Sister    Breast cancer Maternal Aunt    Breast cancer Paternal Aunt 22   Rheum arthritis Maternal Grandmother    Diabetes Paternal Grandmother    Parkinson's disease Paternal Grandfather    Past Surgical History:  Procedure Laterality Date   BREAST BIOPSY Left 01/29/2017   US guided biopsy INVASIVE MAMMARY CARCINOMA   CATARACT EXTRACTION W/PHACO Left 04/28/2018   Procedure: CATARACT EXTRACTION PHACO AND INTRAOCULAR LENS PLACEMENT ([place]);  Surgeon:  Leandrew Koyanagi, MD;  Location: [facility] OR[facility];  Service: Ophthalmology;  Laterality: Left;  Lot # [id number]   1:03 AP%   13.4 CDE    8.40   CATARACT EXTRACTION W/PHACO Right 06/09/2018   Procedure: CATARACT EXTRACTION PHACO AND INTRAOCULAR LENS PLACEMENT ([facility]);  Surgeon: Leandrew Koyanagi, MD;  Location: [facility];  Service: Ophthalmology;  Laterality: Right;  lot# [id number] [place] 0:55 ap 16.3% cde 8.21   HIP ARTHROPLASTY     JOINT REPLACEMENT Right 2003   total hip replacement   LITHOTRIPSY  1997   kidney stone   MASTECTOMY Left 01/29/2017   MASTECTOMY W/ SENTINEL NODE BIOPSY Left 05/18/2017   Procedure: MASTECTOMY WITH SENTINEL LYMPH NODE BIOPSY;  Surgeon: Robert Bellow, MD;  Location: [facility] OR[facility];  Service: General;  Laterality: Left;   PORT-A-CATH REMOVAL  09/2017   PORTACATH PLACEMENT Right 02/15/2017   Procedure: INSERTION PORT-A-CATH;  Surgeon: Robert Bellow, MD;  Location: [facility] OR[facility];  Service: General;  Laterality: Right;   TONSILLECTOMY  39   Social History   Social History Narrative   Not on file   Immunization History  Administered Date(s) Administered   Influenza, High Dose Seasonal PF 07/28/2017, 07/17/2018, 07/26/2019   Influenza,inj,Quad PF,6+ Mos 07/05/2013, 07/13/2014, 08/20/2015   Influenza-Unspecified 07/19/2016, 07/28/2017, 07/17/2018   PFIZER(Purple Top)SARS-COV-2 Vaccination 10/30/2019, 11/18/2019, 06/14/2020   Pneumococcal Conjugate-13 08/20/2015   Pneumococcal Polysaccharide-23 11/03/2006, 07/05/2013   Td 05/03/2002     Objective: Vital Signs: BP 137/84 (BP Location: Right Arm, Patient Position: Sitting, Cuff Size: Normal)   Pulse 87   Resp 12   Ht '5\' 4"'$  (1.626 m)   Wt 175 lb (79.4 kg)   BMI 30.04 kg/m    Physical Exam Vitals and nursing note reviewed.  Constitutional:      Appearance: She is well-developed.  HENT:     Head: Normocephalic and atraumatic.  Eyes:     Conjunctiva/sclera: Conjunctivae normal.  Cardiovascular:     Rate  and Rhythm: Normal rate and regular rhythm.     Heart sounds: Normal heart sounds.  Pulmonary:     Effort: Pulmonary effort is normal.     Breath sounds: Normal breath sounds.  Abdominal:     General: Bowel sounds are normal.     Palpations: Abdomen is soft.  Musculoskeletal:     Cervical back: Normal range of motion.  Skin:    General: Skin is warm and dry.     Capillary Refill: Capillary refill takes less than 2 seconds.  Neurological:     Mental Status: She is alert and oriented to person, place, and time.  Psychiatric:        Behavior: Behavior normal.      Musculoskeletal Exam: C-spine limited ROM with lateral rotation. Painful ROM of lumbar spine. Shoulder joints, elbow joints, wrist joints, MCPs, PIPs, DIPs have good range of motion with no synovitis. PIP and DIP thickening consistent with osteoarthritis of both hands. Tenderness over both CMC joints.  Complete fist formation bilaterally.  Right hip replacement has good ROM.  Left hip joint has good range of motion with no groin pain.  Knee joints have good range of motion with no warmth or effusion.  Ankle joints have good range of motion with no tenderness or joint swelling.  CDAI Exam: CDAI Score: -- Patient Global: 6 mm; Provider Global: 6 mm Swollen: --; Tender: -- Joint Exam 09/16/2022   No joint exam has been documented for this visit   There is currently no information documented on the homunculus. Go to the Rheumatology activity and complete the homunculus joint exam.  Investigation: No additional findings.  Imaging: No results found.  Recent Labs: Lab Results  Component Value Date   WBC 6.2 07/10/2022   HGB 12.2 07/10/2022   PLT 252 07/10/2022   NA 139 07/10/2022   K 4.8 07/10/2022   CL 105 07/10/2022   CO2 27 07/10/2022   GLUCOSE 91 07/10/2022   BUN 16 07/10/2022   CREATININE 0.65 07/10/2022   BILITOT 0.3 07/10/2022   ALKPHOS 85 01/30/2021   AST 14 07/10/2022   ALT 12 07/10/2022   PROT 6.0 (L)  07/10/2022   ALBUMIN 3.6 01/30/2021   CALCIUM 9.2 07/10/2022   GFRAA 101 11/06/2020   QFTBGOLD NEGATIVE 09/03/2017   QFTBGOLDPLUS NEGATIVE 08/15/2021    Speciality Comments: No specialty comments available.  Procedures:  No procedures performed Allergies: Adhesive [tape], Cymbalta [duloxetine hcl], Hydroxychloroquine sulfate, Ibandronate sodium, and Risedronate sodium    Assessment / Plan:  Visit Diagnoses: Seropositive rheumatoid arthritis of multiple sites ([place]) - +RF, erosive disease: She has no synovitis on examination today.  She has not had any signs or symptoms of a rheumatoid arthritis flare.  She continues to experience intermittent arthralgias and joint stiffness especially with weather changes.  Her discomfort has been most severe in her lower back due to generative disc disease.  She takes Celebrex very sparingly for pain relief and gabapentin as prescribed which has been helpful.  She has clinically been doing well taking Enbrel 50 mg subcutaneous injections once weekly and methotrexate 0.8 mL sq injections once weekly.  She has been tolerating combination therapy without any side effects or injection site reactions.  She has not had any gaps in therapy recently.  She has not had any recent or recurrent infections. She will remain on Enbrel and methotrexate as prescribed.  She was advised to notify us if she develops increased joint pain or joint swelling.  She will follow-up in the office in 5 months or sooner if needed.  High risk medication use - Enbrel SureClick 50 mg sq injections every 7 days, methotrexate 0.8 ml sq once weekly, and folic acid 2 mg daily.  (discontinued SSZ in the past due to constipation).  CBC and CMP updated on 07/10/22.  Orders for CBC and CMP were released today.  Her next lab work will be due in March and every 3 months to monitor for drug toxicity. TB gold negative on 08/15/21.  Order for TB gold released today.  She has not had any recent or  recurrent infections. Discussed the importance of holding Enbrel and MTX if she develops signs or symptoms of an infection and to resume once the infection has completely cleared.  - Plan: QuantiFERON-TB Gold Plus, CBC with Differential/Platelet, COMPLETE METABOLIC PANEL WITH GFR Discussed the importance of yearly skin cancer screening with a dermatologist.  She is not currently followed by dermatology.  Screening for tuberculosis -Order for TB gold released today.  Plan: QuantiFERON-TB Gold Plus  Primary osteoarthritis of both hands: She has PIP and DIP thickening consistent with osteoarthritis of both hands.  Complete fist formation bilaterally.  Discussed the importance of joint protection and muscle strengthening.  Patient was encouraged to perform hand exercises daily.  History of right hip replacement: Doing well.  Good range of motion with no discomfort at this time.  Pain in joint involving ankle and foot, unspecified laterality: No tenderness or synovitis on examination today.  DDD (degenerative disc disease), cervical: C-spine has slightly limited range of motion with lateral rotation.  She has trapezius muscle tension and tenderness bilaterally.  DDD (degenerative disc disease), thoracic: No midline spinal tenderness in the thoracic region currently.  DDD (degenerative disc disease), lumbar: Chronic pain.  Difficulty standing or walking for long periods of time.  Her symptoms have been tolerable taking gabapentin as prescribed.  She requested a refill of Celebrex which she takes sparingly for pain relief.  Fibromyalgia: Patient continues to experience intermittent myalgias and muscle tenderness due to fibromyalgia.  Her symptoms are exacerbated by frequent weather changes.  Discussed the importance of regular exercise and good sleep hygiene.  Overall her symptoms have been manageable taking gabapentin and Celebrex as prescribed.  Age-related osteoporosis without current pathological  fracture -DEXA updated May 06, 2017 T score -2.4.  Patient was treated with Forteo and Prolia in the past.  She is taking vitamin D 1000 units daily.   History of vitamin D deficiency: She is taking vitamin  D 1,000 units daily.   Other medical conditions are listed as follows:  Malignant neoplasm of upper-outer quadrant of left breast in female, estrogen receptor negative ([place])  History of hearing loss  History of hypothyroidism  History of depression  History of anxiety  History of migraine    Orders: Orders Placed This Encounter  Procedures   QuantiFERON-TB Gold Plus   CBC with Differential/Platelet   COMPLETE METABOLIC PANEL WITH GFR   Meds ordered this encounter  Medications   celecoxib (CELEBREX) 200 MG capsule    Sig: TAKE 1 CAPSULE BY MOUTH TWICE DAILY WITH FOOD AS NEEDED    Dispense:  120 capsule    Refill:  0      Follow-Up Instructions: Return in about 5 months (around 02/15/2023) for Rheumatoid arthritis, Fibromyalgia, DDD.   Ofilia Neas, PA-C  Note - This record has been created using Dragon software.  Chart creation errors have been sought, but may not always  have been located. Such creation errors do not reflect on  the standard of medical care.

## 2022-09-07 NOTE — Telephone Encounter (Signed)
Signed provider form received. Submitted a Prior Authorization request to  [employer]  for ENBREL via CoverMyMeds. Will update once we receive a response.   Key: [id number]  PAP submission is pending PA approval letter  Knox Saliva, PharmD, MPH, BCPS, CPP Clinical Pharmacist (Rheumatology and Pulmonology)

## 2022-09-09 NOTE — Telephone Encounter (Signed)
CMM will not process PA online. Called [employer] to initiate on phone. Rep will fax PA form to clinic  Knox Saliva, PharmD, MPH, BCPS, CPP Clinical Pharmacist (Rheumatology and Pulmonology)

## 2022-09-14 NOTE — Telephone Encounter (Signed)
Submitted a Prior Authorization request to  [employer]  for ENBREL via fax. Will update once we receive a response.  Fax: [phone] Phone: [phone], [facility] [place] [facility] PharmD Candidate [phone]

## 2022-09-15 NOTE — Telephone Encounter (Signed)
Received notification from  [employer]  regarding a prior authorization for ENBREL. Authorization has been APPROVED from 09/14/22 to 09/15/23  Authorization # [id number]  Knox Saliva, PharmD, MPH, BCPS, CPP Clinical Pharmacist (Rheumatology and Pulmonology)

## 2022-09-15 NOTE — Telephone Encounter (Signed)
Submitted Patient Assistance Application to [employer] for ENBREL along with provider portion, patient portion, PA, insurance cards, and medication list. Will update patient when we receive a response.  Fax# [phone] Phone# [phone]  [place] [facility] Pharm[facility] PharmD Candidate [phone]

## 2022-09-16 ENCOUNTER — Ambulatory Visit: Payer: PPO | Attending: Physician Assistant | Admitting: Physician Assistant

## 2022-09-16 ENCOUNTER — Other Ambulatory Visit: Payer: Self-pay | Admitting: *Deleted

## 2022-09-16 ENCOUNTER — Encounter: Payer: Self-pay | Admitting: Physician Assistant

## 2022-09-16 VITALS — BP 137/84 | HR 87 | Resp 12 | Ht 64.0 in | Wt 175.0 lb

## 2022-09-16 DIAGNOSIS — C50412 Malignant neoplasm of upper-outer quadrant of left female breast: Secondary | ICD-10-CM

## 2022-09-16 DIAGNOSIS — M5134 Other intervertebral disc degeneration, thoracic region: Secondary | ICD-10-CM | POA: Diagnosis not present

## 2022-09-16 DIAGNOSIS — M5030 Other cervical disc degeneration, unspecified cervical region: Secondary | ICD-10-CM

## 2022-09-16 DIAGNOSIS — M5136 Other intervertebral disc degeneration, lumbar region: Secondary | ICD-10-CM

## 2022-09-16 DIAGNOSIS — Z79899 Other long term (current) drug therapy: Secondary | ICD-10-CM

## 2022-09-16 DIAGNOSIS — M19041 Primary osteoarthritis, right hand: Secondary | ICD-10-CM | POA: Diagnosis not present

## 2022-09-16 DIAGNOSIS — M25579 Pain in unspecified ankle and joints of unspecified foot: Secondary | ICD-10-CM | POA: Diagnosis not present

## 2022-09-16 DIAGNOSIS — Z111 Encounter for screening for respiratory tuberculosis: Secondary | ICD-10-CM

## 2022-09-16 DIAGNOSIS — M810 Age-related osteoporosis without current pathological fracture: Secondary | ICD-10-CM

## 2022-09-16 DIAGNOSIS — M797 Fibromyalgia: Secondary | ICD-10-CM | POA: Diagnosis not present

## 2022-09-16 DIAGNOSIS — Z8639 Personal history of other endocrine, nutritional and metabolic disease: Secondary | ICD-10-CM | POA: Diagnosis not present

## 2022-09-16 DIAGNOSIS — M0579 Rheumatoid arthritis with rheumatoid factor of multiple sites without organ or systems involvement: Secondary | ICD-10-CM

## 2022-09-16 DIAGNOSIS — M19042 Primary osteoarthritis, left hand: Secondary | ICD-10-CM

## 2022-09-16 DIAGNOSIS — Z8659 Personal history of other mental and behavioral disorders: Secondary | ICD-10-CM

## 2022-09-16 DIAGNOSIS — Z171 Estrogen receptor negative status [ER-]: Secondary | ICD-10-CM

## 2022-09-16 DIAGNOSIS — Z96641 Presence of right artificial hip joint: Secondary | ICD-10-CM | POA: Diagnosis not present

## 2022-09-16 DIAGNOSIS — Z8669 Personal history of other diseases of the nervous system and sense organs: Secondary | ICD-10-CM

## 2022-09-16 MED ORDER — CELECOXIB 200 MG PO CAPS: ORAL_CAPSULE | ORAL | 0 refills | Status: DC

## 2022-09-16 MED ORDER — ENBREL SURECLICK 50 MG/ML ~~LOC~~ SOAJ: 50.0000 mg | SUBCUTANEOUS | 0 refills | Status: DC

## 2022-09-16 NOTE — Patient Instructions (Signed)
Standing Labs We placed an order today for your standing lab work.   Please have your standing labs drawn in march and every 3 months   Please have your labs drawn 2 weeks prior to your appointment so that the provider can discuss your lab results at your appointment.  Please note that you may see your imaging and lab results in MyChart before we have reviewed them. We will contact you once all results are reviewed. Please allow our office up to 72 hours to thoroughly review all of the results before contacting the office for clarification of your results.  Lab hours are:   Monday through Thursday from 8:00 am -12:30 pm and 1:00 pm-5:00 pm and Friday from 8:00 am-12:00 pm.  Please be advised, all patients with office appointments requiring lab work will take precedent over walk-in lab work.   Labs are drawn by [facility]. Please bring your co-pay at the time of your lab draw.  You may receive a bill from [facility] for your lab work.  Please note if you are on Hydroxychloroquine and and an order has been placed for a Hydroxychloroquine level, you will need to have it drawn 4 hours or more after your last dose.  If you wish to have your labs drawn at another location, please call the office 24 hours in advance so we can fax the orders.  The office is located at [address], [address], [place], [place] [address] No appointment is necessary.    If you have any questions regarding directions or hours of operation,  please call [phone].   As a reminder, please drink plenty of water prior to coming for your lab work. Thanks!  If you have signs or symptoms of an infection or start antibiotics: First, call your PCP for workup of your infection. Hold your medication through the infection, until you complete your antibiotics, and until symptoms resolve if you take the following: Injectable medication (Actemra, Benlysta, Cimzia, Cosentyx, Enbrel, Humira, Kevzara, Orencia, Remicade, Simponi,  Stelara, Taltz, Tremfya) Methotrexate Leflunomide (Arava) Mycophenolate (Cellcept) Xeljanz, Olumiant, or Rinvoq Vaccines You are taking a medication(s) that can suppress your immune system.  The following immunizations are recommended: Flu annually Covid-19  Td/Tdap (tetanus, diphtheria, pertussis) every 10 years Pneumonia (Prevnar 15 then Pneumovax 23 at least 1 year apart.  Alternatively, can take Prevnar 20 without needing additional dose) Shingrix: 2 doses from 4 weeks to 6 months apart  Please check with your PCP to make sure you are up to date.   

## 2022-09-17 ENCOUNTER — Telehealth: Payer: Self-pay | Admitting: *Deleted

## 2022-09-17 DIAGNOSIS — Z79899 Other long term (current) drug therapy: Secondary | ICD-10-CM

## 2022-09-17 NOTE — Addendum Note (Signed)
Addended by: Carole Binning on: 09/17/2022 02:00 PM   Modules accepted: Orders

## 2022-09-17 NOTE — Progress Notes (Signed)
Hemoglobin is borderline low-11.6. rest of CBC WNL.   Creatinine is borderline elevated-1.01 and GFR is slightly low at 58-dropped from 92 2 months ago.  Please clarify how often she has been taking celebrex?  I would recommend avoiding the use of celebrex.  Recheck BMP with GFR in 1 month.   If creatinine remains elevated and GFR is low we will need to discuss reducing the dose of MTX.

## 2022-09-17 NOTE — Telephone Encounter (Signed)
-----   Message from Ofilia Neas, PA-C sent at 09/17/2022 12:38 PM EST ----- Hemoglobin is borderline low-11.6. rest of CBC WNL.   Creatinine is borderline elevated-1.01 and GFR is slightly low at 58-dropped from 92 2 months ago.  Please clarify how often she has been taking celebrex?  I would recommend avoiding the use of celebrex.  Recheck BMP with GFR in 1 month.   If creatinine remains elevated and GFR is low we will need to discuss reducing the dose of MTX.

## 2022-09-17 NOTE — Telephone Encounter (Signed)
Received fax from [place] stating they are missing PA approval letter for Enbrel from most recent plan year however this was faxed.  Case ID: [id number]  Per rep, they do have all of the documents they need but their income check ran a higher income than what patient reports. Reached out to patient to advise that she will need to provide proof of income. She states her son may be able to fax 2022 tax return but I recommended she attached her 2023 SS benefits statement as well to provide most updated information. Provided her with [employer] fax number and her [employer] Case ID #.  Phone# [phone]  Knox Saliva, PharmD, MPH, BCPS, CPP Clinical Pharmacist (Rheumatology and Pulmonology)

## 2022-09-19 LAB — COMPLETE METABOLIC PANEL WITH GFR
AG Ratio: 1.6 (calc) (ref 1.0–2.5)
ALT: 12 U/L (ref 6–29)
AST: 14 U/L (ref 10–35)
Albumin: 3.8 g/dL (ref 3.6–5.1)
Alkaline phosphatase (APISO): 76 U/L (ref 37–153)
BUN/Creatinine Ratio: 17 (calc) (ref 6–22)
BUN: 17 mg/dL (ref 7–25)
CO2: 26 mmol/L (ref 20–32)
Calcium: 8.9 mg/dL (ref 8.6–10.4)
Chloride: 106 mmol/L (ref 98–110)
Creat: 1.01 mg/dL — ABNORMAL HIGH (ref 0.60–1.00)
Globulin: 2.4 g/dL (calc) (ref 1.9–3.7)
Glucose, Bld: 98 mg/dL (ref 65–139)
Potassium: 4.3 mmol/L (ref 3.5–5.3)
Sodium: 139 mmol/L (ref 135–146)
Total Bilirubin: 0.3 mg/dL (ref 0.2–1.2)
Total Protein: 6.2 g/dL (ref 6.1–8.1)
eGFR: 58 mL/min/{1.73_m2} — ABNORMAL LOW (ref >=60)

## 2022-09-19 LAB — QUANTIFERON-TB GOLD PLUS
Mitogen-NIL: >10 IU/mL
NIL: 0.04 IU/mL
QuantiFERON-TB Gold Plus: NEGATIVE
TB1-NIL: <0 IU/mL
TB2-NIL: 0 IU/mL

## 2022-09-19 LAB — CBC WITH DIFFERENTIAL/PLATELET
Absolute Monocytes: 531 cells/uL (ref 200–950)
Basophils Absolute: 39 cells/uL (ref 0–200)
Basophils Relative: 0.5 %
Eosinophils Absolute: 139 cells/uL (ref 15–500)
Eosinophils Relative: 1.8 %
HCT: 36 % (ref 35.0–45.0)
Hemoglobin: 11.6 g/dL — ABNORMAL LOW (ref 11.7–15.5)
Lymphs Abs: 1448 cells/uL (ref 850–3900)
MCH: 27.7 pg (ref 27.0–33.0)
MCHC: 32.2 g/dL (ref 32.0–36.0)
MCV: 85.9 fL (ref 80.0–100.0)
MPV: 11.3 fL (ref 7.5–12.5)
Monocytes Relative: 6.9 %
Neutro Abs: 5544 cells/uL (ref 1500–7800)
Neutrophils Relative %: 72 %
Platelets: 294 10*3/uL (ref 140–400)
RBC: 4.19 10*6/uL (ref 3.80–5.10)
RDW: 15.7 % — ABNORMAL HIGH (ref 11.0–15.0)
Total Lymphocyte: 18.8 %
WBC: 7.7 10*3/uL (ref 3.8–10.8)

## 2022-09-21 NOTE — Progress Notes (Signed)
TB gold negative

## 2022-09-23 ENCOUNTER — Other Ambulatory Visit: Payer: Self-pay | Admitting: Family Medicine

## 2022-09-23 NOTE — Telephone Encounter (Signed)
Received income documents from patients. Submitted to [employer] via fax today  Fax# [phone] Phone# [phone]  Knox Saliva, PharmD, MPH, BCPS, CPP Clinical Pharmacist (Rheumatology and Pulmonology)

## 2022-10-16 ENCOUNTER — Other Ambulatory Visit: Payer: Self-pay | Admitting: *Deleted

## 2022-10-16 DIAGNOSIS — Z79899 Other long term (current) drug therapy: Secondary | ICD-10-CM | POA: Diagnosis not present

## 2022-10-17 LAB — BASIC METABOLIC PANEL WITH GFR
BUN: 17 mg/dL (ref 7–25)
CO2: 23 mmol/L (ref 20–32)
Calcium: 8.7 mg/dL (ref 8.6–10.4)
Chloride: 107 mmol/L (ref 98–110)
Creat: 0.68 mg/dL (ref 0.60–1.00)
Glucose, Bld: 100 mg/dL — ABNORMAL HIGH (ref 65–99)
Potassium: 4 mmol/L (ref 3.5–5.3)
Sodium: 139 mmol/L (ref 135–146)
eGFR: 91 mL/min/{1.73_m2} (ref >=60)

## 2022-10-20 NOTE — Progress Notes (Signed)
Creatinine and GFR have returned to WNL.  Rest of BMP WNL.

## 2022-10-28 NOTE — Telephone Encounter (Signed)
Spoke with [employer] rep regarding PAP application for ENBREL. Rep stated that they did not receive the income documents that were faxed on 09/23/2022. Will need to refax documents later today  Maryan Puls, PharmD PGY-1 [facility] Resident

## 2022-10-30 NOTE — Telephone Encounter (Signed)
Received a fax from  [place] regarding an approval for ENBREL patient assistance from 10/30/2022 to 10/19/2023. Approval letter sent to scan center.  Phone number: [phone]

## 2022-11-15 ENCOUNTER — Other Ambulatory Visit: Payer: Self-pay | Admitting: Physician Assistant

## 2022-11-16 NOTE — Telephone Encounter (Signed)
Next Visit: 02/11/2023  Last Visit: 78/47/8412  Last Fill: folic acid: 05/20/812 MTX: 08/18/2022   DX: Seropositive rheumatoid arthritis of multiple sites   Current Dose per office note on 09/16/2022: methotrexate 0.8 ml sq once weekly, and folic acid 2 mg daily.   Labs: 09/16/2022 Hemoglobin is borderline low-11.6. rest of CBC WNL.   Creatinine is borderline elevated-1.01 and GFR is slightly low at 58-dropped from 92 2 months ago.  Please clarify how often she has been taking celebrex?  I would recommend avoiding the use of celebrex.  Recheck BMP with GFR in 1 month.   If creatinine remains elevated and GFR is low we will need to discuss reducing the dose of MTX. 10/16/2022 Creatinine and GFR have returned to WNL.  Rest of BMP WNL.     Okay to refill MTX and folic acid?

## 2022-12-15 ENCOUNTER — Encounter: Payer: Self-pay | Admitting: Family Medicine

## 2022-12-15 ENCOUNTER — Ambulatory Visit (INDEPENDENT_AMBULATORY_CARE_PROVIDER_SITE_OTHER): Payer: PPO | Admitting: Family Medicine

## 2022-12-15 VITALS — BP 138/80 | HR 93 | Temp 97.9°F | Ht 64.0 in | Wt 178.5 lb

## 2022-12-15 DIAGNOSIS — F341 Dysthymic disorder: Secondary | ICD-10-CM

## 2022-12-15 DIAGNOSIS — E559 Vitamin D deficiency, unspecified: Secondary | ICD-10-CM

## 2022-12-15 DIAGNOSIS — E039 Hypothyroidism, unspecified: Secondary | ICD-10-CM | POA: Diagnosis not present

## 2022-12-15 DIAGNOSIS — R7309 Other abnormal glucose: Secondary | ICD-10-CM | POA: Diagnosis not present

## 2022-12-15 DIAGNOSIS — G4700 Insomnia, unspecified: Secondary | ICD-10-CM | POA: Insufficient documentation

## 2022-12-15 DIAGNOSIS — G4709 Other insomnia: Secondary | ICD-10-CM | POA: Diagnosis not present

## 2022-12-15 LAB — BASIC METABOLIC PANEL
BUN: 15 mg/dL (ref 6–23)
CO2: 26 mEq/L (ref 19–32)
Calcium: 8.8 mg/dL (ref 8.4–10.5)
Chloride: 107 mEq/L (ref 96–112)
Creatinine, Ser: 0.64 mg/dL (ref 0.40–1.20)
GFR: 86.64 mL/min (ref >60.00)
Glucose, Bld: 95 mg/dL (ref 70–99)
Potassium: 4.3 mEq/L (ref 3.5–5.1)
Sodium: 139 mEq/L (ref 135–145)

## 2022-12-15 LAB — TSH: TSH: 0.16 u[IU]/mL — ABNORMAL LOW (ref 0.35–5.50)

## 2022-12-15 LAB — VITAMIN D 25 HYDROXY (VIT D DEFICIENCY, FRACTURES): VITD: 24.99 ng/mL — ABNORMAL LOW (ref 30.00–100.00)

## 2022-12-15 LAB — HEMOGLOBIN A1C: Hgb A1c MFr Bld: 5.8 % (ref 4.6–6.5)

## 2022-12-15 MED ORDER — TRAZODONE HCL 50 MG PO TABS: 25.0000 mg | ORAL_TABLET | Freq: Every evening | ORAL | 3 refills | Status: DC | PRN

## 2022-12-15 MED ORDER — LORAZEPAM 1 MG PO TABS: 1.0000 mg | ORAL_TABLET | Freq: Every evening | ORAL | 1 refills | Status: DC | PRN

## 2022-12-15 NOTE — Assessment & Plan Note (Signed)
Pt takes occ lorazepam for anxiety- during the day or night  Sleep is poor Chronic pain is issue  Disc trial of trazodone 50 mg for sleep

## 2022-12-15 NOTE — Assessment & Plan Note (Signed)
Trial of trazodone 50  [facility] inc if not effective Pain is a factor Disc sleep hygiene   Unable to exercise due to RA

## 2022-12-15 NOTE — Assessment & Plan Note (Signed)
Lab today  No clinical changes Taking levothyroxine 137 mcg daily

## 2022-12-15 NOTE — Assessment & Plan Note (Signed)
Lab today  Disc imp to bone and overall health Taking 1000 iu D3 daily

## 2022-12-15 NOTE — Progress Notes (Signed)
Subjective:    Patient ID: Samantha Valentine, female    DOB: 1946-07-07, 77 y.o.   MRN: [id number]  HPI Pt presents for f/u of hypothyroidism   Wt Readings from Last 3 Encounters:  12/15/22 178 lb 8 oz (81 kg)  09/16/22 175 lb (79.4 kg)  04/15/22 173 lb (78.5 kg)   30.64 kg/m  Not doing a lot lately  Getting by day to day   ?if her RA pain is worse than the MTX Enbrel works fairly well however  Sees rheumatology - has f/u upcoming   Self care if fair  Wishes she could exercise more  Broke out in rash from a pool in the past    Vitals:   12/15/22 0853  BP: (!) 146/80  Pulse: 93  Temp: 97.9 F (36.6 C)  SpO2: 99%    Mood Has a lot mood with - thinkks she does as well as she can PHQ  Mood is affected by pain  Takes lorazepam at bedtime rarely - does not sleep well and cannot sleep during the day either   Has seen family / has 7 yo grand daughter visits /this is fun  Her friend returns from [facility] soon   Last GFR 91   Hypothyroidism  Pt has no clinical changes No change in energy level/ hair or skin/ edema and no tremor Lab Results  Component Value Date   TSH 1.11 12/23/2021    Due for labs  Taking 137 mcg levothyroxine    Past elevated glucose level Lab Results  Component Value Date   HGBA1C 5.6 12/23/2021   Past vit D def  Has h/o OP- declines further eval   Eats healthy  Thinks she eats too much due to boredom Loves cooking also  Patient Active Problem List   Diagnosis Date Noted   Insomnia 12/15/2022   History of breast cancer 12/23/2021   Elevated glucose level 11/23/2018   Routine general medical examination at a health care facility 08/09/2018   DDD (degenerative disc disease), thoracolumbar 06/28/2018   DDD (degenerative disc disease), cervical 06/28/2018   Atherosclerosis of aorta ([place]) 03/11/2018   Sleep disturbance 06/18/2017   Tachycardia 05/25/2017   Estrogen deficiency 03/22/2017   Malignant neoplasm of upper-outer quadrant of  left breast in female, estrogen receptor negative ([place]) 02/04/2017   Tendinopathy of right shoulder 01/25/2017   High risk medication use 09/29/2016   DJD (degenerative joint disease), cervical 09/29/2016   History of right hip replacement 09/29/2016   Eczema 07/13/2014   Adverse effect of immunosuppressive drug 04/27/2012   ANXIETY DEPRESSION 10/28/2008   Spinal stenosis 12/29/2007   Hypothyroidism 12/28/2007   Vitamin D deficiency 12/28/2007   HEARING LOSS 12/28/2007   Seropositive rheumatoid arthritis of multiple sites ([place]) 12/28/2007   DDD (degenerative disc disease), lumbar 12/28/2007   Fibromyalgia 12/28/2007   Osteoporosis 12/28/2007   MIGRAINES, HX OF 12/28/2007   Past Medical History:  Diagnosis Date   Anxiety    Arthritis    RA   Breast cancer ([place]) 01/29/2017   Breast mass 1 year    Cancer ([place]) 01/29/2017   left breast INVASIVE MAMMARY CARCINOMA, ER/PR positive   Cataract 2019   resolved with surgery   Collagen vascular disease ([place])    Rhematoid Arthritis   Complication of anesthesia    DDD (degenerative disc disease)    in neck   Depression    Dyspnea    Edema    FEET/LEGS   Emphysema of lung (  [place])    Fibromyalgia    GERD (gastroesophageal reflux disease)    NO MEDS   History of hiatal hernia    History of kidney stones    MULTIPLE KIDNEY STONES BIL   HOH (hard of hearing)    AIDS   Hypothyroidism    Macular degeneration, bilateral    Migraine    Osteoporosis    Palpitations    Personal history of chemotherapy    prior to mastectomy   PONV (postoperative nausea and vomiting)    AFTER FIRST CATARACT   Rheumatoid arthritis [facility])    Past Surgical History:  Procedure Laterality Date   BREAST BIOPSY Left 01/29/2017   US guided biopsy INVASIVE MAMMARY CARCINOMA   CATARACT EXTRACTION W/PHACO Left 04/28/2018   Procedure: CATARACT EXTRACTION PHACO AND INTRAOCULAR LENS PLACEMENT ([place]);  Surgeon: Leandrew Koyanagi, MD;  Location: [facility] ORS;   Service: Ophthalmology;  Laterality: Left;  Lot # [id number]   1:03 AP%   13.4 CDE    8.40   CATARACT EXTRACTION W/PHACO Right 06/09/2018   Procedure: CATARACT EXTRACTION PHACO AND INTRAOCULAR LENS PLACEMENT ([facility]);  Surgeon: Leandrew Koyanagi, MD;  Location: [facility] ORS;  Service: Ophthalmology;  Laterality: Right;  lot# [id number] [place] 0:55 ap 16.3% cde 8.21   HIP ARTHROPLASTY     JOINT REPLACEMENT Right 2003   total hip replacement   LITHOTRIPSY  1997   kidney stone   MASTECTOMY Left 01/29/2017   MASTECTOMY W/ SENTINEL NODE BIOPSY Left 05/18/2017   Procedure: MASTECTOMY WITH SENTINEL LYMPH NODE BIOPSY;  Surgeon: Robert Bellow, MD;  Location: [facility] ORS;  Service: General;  Laterality: Left;   PORT-A-CATH REMOVAL  09/2017   PORTACATH PLACEMENT Right 02/15/2017   Procedure: INSERTION PORT-A-CATH;  Surgeon: Robert Bellow, MD;  Location: [facility] ORS;  Service: General;  Laterality: Right;   TONSILLECTOMY  1970   Social History   Tobacco Use   Smoking status: Former    Packs/day: 1.50    Years: 25.00    Total pack years: 37.50    Types: Cigarettes    Quit date: 10/20/1983    Years since quitting: 39.1   Smokeless tobacco: Never  Vaping Use   Vaping Use: Never used  Substance Use Topics   Alcohol use: No   Drug use: No   Family History  Problem Relation Age of Onset   Hypertension Mother    Endometrial cancer Mother    Osteoarthritis Mother    Alcohol abuse Father    Diabetes Father    Stroke Father    Liver disease Sister    Breast cancer Maternal Aunt    Breast cancer Paternal Aunt 41   Rheum arthritis Maternal Grandmother    Diabetes Paternal Grandmother    Parkinson's disease Paternal Grandfather    Allergies  Allergen Reactions   Adhesive [Tape] Other (See Comments)    Skin blistered (PAPER TAPE OK)   Cymbalta [Duloxetine Hcl]     Headache Inc bp   Hydroxychloroquine Sulfate Rash   Ibandronate Sodium Rash   Risedronate Sodium Rash   Current Outpatient  Medications on File Prior to Visit  Medication Sig Dispense Refill   aspirin EC 81 MG tablet Take 81 mg by mouth daily.     celecoxib (CELEBREX) 200 MG capsule TAKE 1 CAPSULE BY MOUTH TWICE DAILY WITH FOOD AS NEEDED 120 capsule 0   cholecalciferol (VITAMIN D) 1000 units tablet Take 1,000 Units by mouth daily.     etanercept (ENBREL SURECLICK) 50 MG/ML  injection Inject 50 mg into the skin once a week. 12 mL 0   folic acid (FOLVITE) 1 MG tablet Take 2 tablets by mouth once daily 180 tablet 3   gabapentin (NEURONTIN) 100 MG capsule TAKE 1 CAPSULE BY MOUTH THREE TIMES DAILY 270 capsule 3   levothyroxine (SYNTHROID) 137 MCG tablet TAKE 1 TABLET BY MOUTH ONCE DAILY BEFORE BREAKFAST 90 tablet 0   methotrexate 50 MG/2ML injection INJECT 0.8MLS ('20MG'$  TOTAL) INTO THE SKIN ONCE WEEKLY 10 mL 0   TUBERCULIN SYR 1CC/27GX1/2" (B-D TB SYRINGE 1CC/27GX1/2") 27G X 1/2" 1 ML MISC 12 Syringes by Does not apply route once a week. 12 each 3   No current facility-administered medications on file prior to visit.    Review of Systems  Constitutional:  Positive for fatigue. Negative for activity change, appetite change, fever and unexpected weight change.  HENT:  Negative for congestion, ear pain, rhinorrhea, sinus pressure and sore throat.   Eyes:  Negative for pain, redness and visual disturbance.  Respiratory:  Negative for cough, shortness of breath and wheezing.   Cardiovascular:  Negative for chest pain and palpitations.  Gastrointestinal:  Negative for abdominal pain, blood in stool, constipation and diarrhea.  Endocrine: Negative for polydipsia and polyuria.  Genitourinary:  Negative for dysuria, frequency and urgency.  Musculoskeletal:  Positive for arthralgias and back pain. Negative for myalgias.  Skin:  Negative for pallor and rash.  Allergic/Immunologic: Negative for environmental allergies.  Neurological:  Negative for dizziness, syncope and headaches.  Hematological:  Negative for adenopathy. Does  not bruise/bleed easily.  Psychiatric/Behavioral:  Positive for sleep disturbance. Negative for decreased concentration and dysphoric mood. The patient is nervous/anxious.        Objective:   Physical Exam Constitutional:      General: She is not in acute distress.    Appearance: Normal appearance. She is well-developed. She is obese. She is not ill-appearing or diaphoretic.  HENT:     Head: Normocephalic and atraumatic.  Eyes:     Conjunctiva/sclera: Conjunctivae normal.     Pupils: Pupils are equal, round, and reactive to light.  Neck:     Thyroid: No thyromegaly.     Vascular: No carotid bruit or JVD.  Cardiovascular:     Rate and Rhythm: Normal rate and regular rhythm.     Heart sounds: Normal heart sounds.     No gallop.  Pulmonary:     Effort: Pulmonary effort is normal. No respiratory distress.     Breath sounds: Normal breath sounds. No wheezing or rales.  Abdominal:     General: There is no distension or abdominal bruit.     Palpations: Abdomen is soft.  Musculoskeletal:     Cervical back: Normal range of motion and neck supple.     Right lower leg: No edema.     Left lower leg: No edema.     Comments: Joint changes noted of RA  Lymphadenopathy:     Cervical: No cervical adenopathy.  Skin:    General: Skin is warm and dry.     Coloration: Skin is not pale.     Findings: No rash.  Neurological:     Mental Status: She is alert.     Motor: No tremor.     Coordination: Coordination normal.     Deep Tendon Reflexes: Reflexes are normal and symmetric. Reflexes normal.  Psychiatric:        Mood and Affect: Mood normal. Mood is not anxious or depressed.  Cognition and Memory: Cognition and memory normal.     Comments: Candidly discusses symptoms and stressors             Assessment & Plan:   Problem List Items Addressed This Visit       Endocrine   Hypothyroidism - Primary    Lab today  No clinical changes Taking levothyroxine 137 mcg daily       Relevant Orders   TSH     Other   ANXIETY DEPRESSION    Pt takes occ lorazepam for anxiety- during the day or night  Sleep is poor Chronic pain is issue  Disc trial of trazodone 50 mg for sleep      Relevant Medications   traZODone (DESYREL) 50 MG tablet   LORazepam (ATIVAN) 1 MG tablet   Elevated glucose level    Diet is stable A1c ordered  disc imp of low glycemic diet and wt loss to prevent DM2       Relevant Orders   Basic metabolic panel   Hemoglobin A1c   Insomnia    Trial of trazodone 50  Can inc if not effective Pain is a factor Disc sleep hygiene   Unable to exercise due to RA      Vitamin D deficiency    Lab today  Disc imp to bone and overall health Taking 1000 iu D3 daily      Relevant Orders   VITAMIN D 25 Hydroxy (Vit-D Deficiency, Fractures)

## 2022-12-15 NOTE — Patient Instructions (Addendum)
Try trazodone for sleep  50 mg at bedtime   Keep [place] posted   Use lorazepam for anxiety only if really needed    Labs today   Take care of yourself

## 2022-12-15 NOTE — Assessment & Plan Note (Signed)
Diet is stable A1c ordered  disc imp of low glycemic diet and wt loss to prevent DM2

## 2022-12-16 ENCOUNTER — Other Ambulatory Visit: Payer: Self-pay

## 2022-12-16 MED ORDER — GABAPENTIN 100 MG PO CAPS: ORAL_CAPSULE | ORAL | 0 refills | Status: DC

## 2022-12-16 MED ORDER — LEVOTHYROXINE SODIUM 125 MCG PO TABS: 125.0000 ug | ORAL_TABLET | Freq: Every day | ORAL | 1 refills | Status: DC

## 2023-01-07 DIAGNOSIS — H35373 Puckering of macula, bilateral: Secondary | ICD-10-CM | POA: Diagnosis not present

## 2023-01-07 DIAGNOSIS — H43813 Vitreous degeneration, bilateral: Secondary | ICD-10-CM | POA: Diagnosis not present

## 2023-01-07 DIAGNOSIS — H353132 Nonexudative age-related macular degeneration, bilateral, intermediate dry stage: Secondary | ICD-10-CM | POA: Diagnosis not present

## 2023-01-07 DIAGNOSIS — Z961 Presence of intraocular lens: Secondary | ICD-10-CM | POA: Diagnosis not present

## 2023-01-08 ENCOUNTER — Other Ambulatory Visit: Payer: Self-pay | Admitting: Family Medicine

## 2023-01-08 DIAGNOSIS — Z1231 Encounter for screening mammogram for malignant neoplasm of breast: Secondary | ICD-10-CM

## 2023-01-20 ENCOUNTER — Other Ambulatory Visit: Payer: Self-pay | Admitting: *Deleted

## 2023-01-20 DIAGNOSIS — Z79899 Other long term (current) drug therapy: Secondary | ICD-10-CM

## 2023-01-21 ENCOUNTER — Other Ambulatory Visit: Payer: Self-pay | Admitting: *Deleted

## 2023-01-21 LAB — COMPLETE METABOLIC PANEL WITH GFR
AG Ratio: 1.5 (calc) (ref 1.0–2.5)
ALT: 10 U/L (ref 6–29)
AST: 12 U/L (ref 10–35)
Albumin: 3.7 g/dL (ref 3.6–5.1)
Alkaline phosphatase (APISO): 80 U/L (ref 37–153)
BUN: 14 mg/dL (ref 7–25)
CO2: 23 mmol/L (ref 20–32)
Calcium: 8.8 mg/dL (ref 8.6–10.4)
Chloride: 107 mmol/L (ref 98–110)
Creat: 0.66 mg/dL (ref 0.60–1.00)
Globulin: 2.5 g/dL (calc) (ref 1.9–3.7)
Glucose, Bld: 94 mg/dL (ref 65–99)
Potassium: 4.9 mmol/L (ref 3.5–5.3)
Sodium: 140 mmol/L (ref 135–146)
Total Bilirubin: 0.4 mg/dL (ref 0.2–1.2)
Total Protein: 6.2 g/dL (ref 6.1–8.1)
eGFR: 91 mL/min/{1.73_m2} (ref >=60)

## 2023-01-21 LAB — CBC WITH DIFFERENTIAL/PLATELET
Absolute Monocytes: 413 cells/uL (ref 200–950)
Basophils Absolute: 51 cells/uL (ref 0–200)
Basophils Relative: 1 %
Eosinophils Absolute: 184 cells/uL (ref 15–500)
Eosinophils Relative: 3.6 %
HCT: 36.7 % (ref 35.0–45.0)
Hemoglobin: 11.1 g/dL — ABNORMAL LOW (ref 11.7–15.5)
Lymphs Abs: 1484.1 cells/uL (ref 850–3900)
MCH: 25.8 pg — ABNORMAL LOW (ref 27.0–33.0)
MCHC: 30.2 g/dL — ABNORMAL LOW (ref 32.0–36.0)
MCV: 85.3 fL (ref 80.0–100.0)
MPV: 11.5 fL (ref 7.5–12.5)
Monocytes Relative: 8.1 %
Neutro Abs: 2968 cells/uL (ref 1500–7800)
Neutrophils Relative %: 58.2 %
Platelets: 299 10*3/uL (ref 140–400)
RBC: 4.3 10*6/uL (ref 3.80–5.10)
RDW: 17.6 % — ABNORMAL HIGH (ref 11.0–15.0)
Total Lymphocyte: 29.1 %
WBC: 5.1 10*3/uL (ref 3.8–10.8)

## 2023-01-21 MED ORDER — ENBREL SURECLICK 50 MG/ML ~~LOC~~ SOAJ: 50.0000 mg | SUBCUTANEOUS | 0 refills | Status: DC

## 2023-01-21 NOTE — Progress Notes (Signed)
Anemia persists with hemoglobin of 11.1.  CMP is normal.  Please advise patient to take folic acid on a regular basis.

## 2023-01-21 NOTE — Telephone Encounter (Signed)
Patient requested refill on Enbrel to be sent to [facility].  Last Fill: 09/16/2022  Labs: 01/20/2023 Anemia persists with hemoglobin of 11.1.  CMP is normal.     TB Gold: 09/16/2022 Neg   Next Visit: 02/11/2023  Last Visit: 09/16/2022  DX: Seropositive rheumatoid arthritis of multiple sites   Current Dose per office note [id number]: Enbrel SureClick 50 mg sq injections every 7 days   Okay to refill Enbrel?

## 2023-01-28 NOTE — Progress Notes (Signed)
Office Visit Note  Patient: Samantha Valentine             Date of Birth: 1945/12/01           MRN: [id number]             PCP: Judy Pimple, MD Referring: Tower, Audrie Gallus, MD Visit Date: 02/11/2023 Occupation: @GUAROCC @  Subjective:  Fatigue  History of Present Illness: Samantha Valentine is a 77 y.o. female with history of seropositive rheumatoid arthritis, osteoarthritis, fibromyalgia, and osteoporosis.  She is currently on Enbrel SureClick 50 mg sq injections every 7 days, methotrexate 0.8 ml sq once weekly, and folic acid 2 mg daily.  She has not missed any doses recently.  Patient has been experiencing significant fatigue, intermittent lightheadedness, and cold intolerance which she attributes to being anemic.  She is concerned that methotrexate is the cause for anemia and would like to discuss her options.  She states that her fatigue has been significantly interfering with her quality of life.   Activities of Daily Living:  Patient reports morning stiffness for 5-10 minutes.   Patient Reports nocturnal pain.  Difficulty dressing/grooming: Reports Difficulty climbing stairs: Reports Difficulty getting out of chair: Reports Difficulty using hands for taps, buttons, cutlery, and/or writing: Reports  Review of Systems  Constitutional:  Positive for fatigue.  HENT:  Positive for mouth dryness. Negative for mouth sores.   Eyes:  Positive for dryness.  Respiratory:  Positive for shortness of breath.   Cardiovascular:  Negative for chest pain and palpitations.  Gastrointestinal:  Negative for blood in stool, constipation and diarrhea.  Endocrine: Negative for increased urination.  Genitourinary:  Negative for involuntary urination.  Musculoskeletal:  Positive for joint pain, gait problem, joint pain, joint swelling, myalgias, morning stiffness and myalgias. Negative for muscle weakness and muscle tenderness.  Skin:  Positive for hair loss and sensitivity to sunlight. Negative for  color change and rash.  Allergic/Immunologic: Negative for susceptible to infections.  Neurological:  Positive for light-headedness and headaches. Negative for dizziness.  Hematological:  Negative for swollen glands.  Psychiatric/Behavioral:  Positive for sleep disturbance. Negative for depressed mood. The patient is not nervous/anxious.     PMFS History:  Patient Active Problem List   Diagnosis Date Noted   Insomnia 12/15/2022   History of breast cancer 12/23/2021   Elevated glucose level 11/23/2018   Routine general medical examination at a health care facility 08/09/2018   DDD (degenerative disc disease), thoracolumbar 06/28/2018   DDD (degenerative disc disease), cervical 06/28/2018   Atherosclerosis of aorta 03/11/2018   Sleep disturbance 06/18/2017   Tachycardia 05/25/2017   Estrogen deficiency 03/22/2017   Malignant neoplasm of upper-outer quadrant of left breast in female, estrogen receptor negative 02/04/2017   Tendinopathy of right shoulder 01/25/2017   High risk medication use 09/29/2016   DJD (degenerative joint disease), cervical 09/29/2016   History of right hip replacement 09/29/2016   Eczema 07/13/2014   Adverse effect of immunosuppressive drug 04/27/2012   ANXIETY DEPRESSION 10/28/2008   Spinal stenosis 12/29/2007   Hypothyroidism 12/28/2007   Vitamin D deficiency 12/28/2007   HEARING LOSS 12/28/2007   Seropositive rheumatoid arthritis of multiple sites 12/28/2007   DDD (degenerative disc disease), lumbar 12/28/2007   Fibromyalgia 12/28/2007   Osteoporosis 12/28/2007   MIGRAINES, HX OF 12/28/2007    Past Medical History:  Diagnosis Date   Anxiety    Arthritis    RA   Breast cancer 01/29/2017   Breast mass 1 year  Cancer 01/29/2017   left breast INVASIVE MAMMARY CARCINOMA, ER/PR positive   Cataract 2019   resolved with surgery   Collagen vascular disease    Rhematoid Arthritis   Complication of anesthesia    DDD (degenerative disc disease)    in  neck   Depression    Dyspnea    Edema    FEET/LEGS   Emphysema of lung    Fibromyalgia    GERD (gastroesophageal reflux disease)    NO MEDS   History of hiatal hernia    History of kidney stones    MULTIPLE KIDNEY STONES BIL   HOH (hard of hearing)    AIDS   Hypothyroidism    Macular degeneration, bilateral    Migraine    Osteoporosis    Palpitations    Personal history of chemotherapy    prior to mastectomy   PONV (postoperative nausea and vomiting)    AFTER FIRST CATARACT   Rheumatoid arthritis     Family History  Problem Relation Age of Onset   Hypertension Mother    Endometrial cancer Mother    Osteoarthritis Mother    Alcohol abuse Father    Diabetes Father    Stroke Father    Liver disease Sister    Breast cancer Maternal Aunt    Breast cancer Paternal Aunt 54   Rheum arthritis Maternal Grandmother    Diabetes Paternal Grandmother    Parkinson's disease Paternal Grandfather    Past Surgical History:  Procedure Laterality Date   BREAST BIOPSY Left 01/29/2017   US guided biopsy INVASIVE MAMMARY CARCINOMA   CATARACT EXTRACTION W/PHACO Left 04/28/2018   Procedure: CATARACT EXTRACTION PHACO AND INTRAOCULAR LENS PLACEMENT ([facility]);  Surgeon: Lockie Mola, MD;  Location: [facility] ORS;  Service: Ophthalmology;  Laterality: Left;  Lot # [id number]   1:03 AP%   13.4 CDE    8.40   CATARACT EXTRACTION W/PHACO Right 06/09/2018   Procedure: CATARACT EXTRACTION PHACO AND INTRAOCULAR LENS PLACEMENT ([facility]);  Surgeon: Lockie Mola, MD;  Location: [facility] ORS;  Service: Ophthalmology;  Laterality: Right;  lot# [id number] [place] 0:55 ap 16.3% cde 8.21   HIP ARTHROPLASTY     JOINT REPLACEMENT Right 2003   total hip replacement   LITHOTRIPSY  1997   kidney stone   MASTECTOMY Left 01/29/2017   MASTECTOMY W/ SENTINEL NODE BIOPSY Left 05/18/2017   Procedure: MASTECTOMY WITH SENTINEL LYMPH NODE BIOPSY;  Surgeon: Earline Mayotte, MD;  Location: [facility] ORS;  Service: General;   Laterality: Left;   PORT-A-CATH REMOVAL  09/2017   PORTACATH PLACEMENT Right 02/15/2017   Procedure: INSERTION PORT-A-CATH;  Surgeon: Earline Mayotte, MD;  Location: [facility] ORS;  Service: General;  Laterality: Right;   TONSILLECTOMY  1970   Social History   Social History Narrative   Not on file   Immunization History  Administered Date(s) Administered   Influenza, High Dose Seasonal PF 07/28/2017, 07/17/2018, 07/26/2019   Influenza,inj,Quad PF,6+ Mos 07/05/2013, 07/13/2014, 08/20/2015   Influenza-Unspecified 07/19/2016, 07/28/2017, 07/17/2018   PFIZER(Purple Top)SARS-COV-2 Vaccination 10/30/2019, 11/18/2019, 06/14/2020   Pneumococcal Conjugate-13 08/20/2015   Pneumococcal Polysaccharide-23 11/03/2006, 07/05/2013   Td 05/03/2002     Objective: Vital Signs: BP (!) 163/94 (BP Location: Right Arm, Patient Position: Sitting, Cuff Size: Small)   Pulse 83   Resp 12   Ht 5\' 4"  (1.626 m)   Wt 176 lb 12.8 oz (80.2 kg)   BMI 30.35 kg/m    Physical Exam Vitals and nursing note reviewed.  Constitutional:  Appearance: She is well-developed.  HENT:     Head: Normocephalic and atraumatic.  Eyes:     Conjunctiva/sclera: Conjunctivae normal.  Cardiovascular:     Rate and Rhythm: Normal rate and regular rhythm.     Heart sounds: Normal heart sounds.  Pulmonary:     Effort: Pulmonary effort is normal.     Breath sounds: Normal breath sounds.  Abdominal:     General: Bowel sounds are normal.     Palpations: Abdomen is soft.  Musculoskeletal:     Cervical back: Normal range of motion.  Lymphadenopathy:     Cervical: No cervical adenopathy.  Skin:    General: Skin is warm and dry.     Capillary Refill: Capillary refill takes less than 2 seconds.  Neurological:     Mental Status: She is alert and oriented to person, place, and time.  Psychiatric:        Behavior: Behavior normal.      Musculoskeletal Exam: C-spine has limited range of motion with lateral rotation.  Shoulder  joints, elbow joints, wrist joints, MCPs, PIPs, DIPs have good range of motion with no synovitis.  PIP and DIP thickening consistent with osteoarthritis of both hands.  CMC joint prominence noted bilaterally.  Right hip replacement has good range of motion.  Left hip joint has good range of motion.  Both knee joints have good range of motion with no warmth or effusion noted today.  Ankle joints have good range of motion with no tenderness or joint swelling.  CDAI Exam: CDAI Score: -- Patient Global: --; Provider Global: -- Swollen: --; Tender: -- Joint Exam 02/11/2023   No joint exam has been documented for this visit   There is currently no information documented on the homunculus. Go to the Rheumatology activity and complete the homunculus joint exam.  Investigation: No additional findings.  Imaging: No results found.  Recent Labs: Lab Results  Component Value Date   WBC 5.1 01/20/2023   HGB 11.1 (L) 01/20/2023   PLT 299 01/20/2023   NA 140 01/20/2023   K 4.9 01/20/2023   CL 107 01/20/2023   CO2 23 01/20/2023   GLUCOSE 94 01/20/2023   BUN 14 01/20/2023   CREATININE 0.66 01/20/2023   BILITOT 0.4 01/20/2023   ALKPHOS 85 01/30/2021   AST 12 01/20/2023   ALT 10 01/20/2023   PROT 6.2 01/20/2023   ALBUMIN 3.6 01/30/2021   CALCIUM 8.8 01/20/2023   GFRAA 101 11/06/2020   QFTBGOLD NEGATIVE 09/03/2017   QFTBGOLDPLUS NEGATIVE 09/16/2022    Speciality Comments: No specialty comments available.  Procedures:  No procedures performed Allergies: Adhesive [tape], Cymbalta [duloxetine hcl], Hydroxychloroquine sulfate, Ibandronate sodium, and Risedronate sodium      Assessment / Plan:     Visit Diagnoses: Seropositive rheumatoid arthritis of multiple sites - +RF, erosive disease: She has no synovitis on examination today.  She remains on Enbrel 50 mg subcutaneous injections once weekly along with methotrexate 0.8 mL sq injections once weekly.  She has been experiencing significant  fatigue which she attributes to anemia secondary to methotrexate use.  Plan to try reducing methotrexate to 0.6 mL sq injections once weekly and the patient will return for 1 month lab work including CBC with differential and iron panel.  She was advised to notify us if the fatigue persists or worsens.  She will remain on enbrel as prescribed.  She will follow up in the office in 3 months or sooner if needed.   High risk medication  use - Enbrel SureClick 50 mg sq injections every 7 days, methotrexate 0.6 ml sq once weekly, and folic acid 2 mg daily. (d/c SSZ in the past due to constipation). CBC and CMP updated on 01/20/23.  Her next lab work will be due in July and every 3 months.  TB gold negative 09/16/22.  Discussed the importance of holding Enbrel and MTX if she develops signs or symptoms of an infection and to resume once the infection has completely cleared.   - Plan: Fe+TIBC+Fer  Primary osteoarthritis of both hands: PIP and DIP thickening.  CMC thickening noted bilaterally.  No active inflammation.  History of right hip replacement: Doing well.  Good ROM with no groin pain.   DDD (degenerative disc disease), cervical: Limited ROM with lateral rotation.   DDD (degenerative disc disease), thoracic: Chronic pain   DDD (degenerative disc disease), lumbar: Chronic pain   Fibromyalgia: Intermittent flares.  She has been experiencing profound fatigue which he attributes to insomnia.  Age-related osteoporosis without current pathological fracture - DEXA updated May 06, 2017 T score -2.4.  Patient was treated with Forteo and Prolia in the past.  She is taking vitamin D 1000 units daily.  History of vitamin D deficiency: She is taking vitamin D 1,000 units daily.   History of anemia -Hemoglobin slightly low at 11.1 on 01/19/2022.  Hematocrit and red blood cell count are within normal limits.   Patient has been experiencing significant fatigue on a daily basis.  Her level of fatigue has been  significantly interfering with her quality of life.  She has also noticed intermittent lightheadedness and cold intolerance which she attributes to being anemic.  She feels that the anemia secondary to methotrexate so we discussed reducing the dose of methotrexate to 0.6 mL sq injections once weekly.  Plan to update CBC with differential and iron panel in 1 month.  Plan: Fe+TIBC+Fer  Other medical conditions are listed as follows:   Malignant neoplasm of upper-outer quadrant of left breast in female, estrogen receptor negative  History of hearing loss  History of hypothyroidism: Followed by Dr. Milinda Antis.   History of depression  History of anxiety  History of migraine  Orders: Orders Placed This Encounter  Procedures   Fe+TIBC+Fer   No orders of the defined types were placed in this encounter.    Follow-Up Instructions: Return in about 3 months (around 05/13/2023) for Rheumatoid arthritis, Osteoarthritis, Fibromyalgia.   Gearldine Bienenstock, PA-C  Note - This record has been created using Dragon software.  Chart creation errors have been sought, but may not always  have been located. Such creation errors do not reflect on  the standard of medical care.

## 2023-02-01 ENCOUNTER — Telehealth: Payer: Self-pay | Admitting: Family Medicine

## 2023-02-01 DIAGNOSIS — E039 Hypothyroidism, unspecified: Secondary | ICD-10-CM

## 2023-02-01 NOTE — Telephone Encounter (Signed)
-----   Message from Alvina Chou sent at 01/18/2023 10:13 AM EDT ----- Regarding: Lab orders for Tuesday, 4.16.24 Lab orders for Thyroid

## 2023-02-02 ENCOUNTER — Other Ambulatory Visit (INDEPENDENT_AMBULATORY_CARE_PROVIDER_SITE_OTHER): Payer: PPO

## 2023-02-02 DIAGNOSIS — E039 Hypothyroidism, unspecified: Secondary | ICD-10-CM | POA: Diagnosis not present

## 2023-02-02 LAB — TSH: TSH: 1.27 u[IU]/mL (ref 0.35–5.50)

## 2023-02-03 ENCOUNTER — Other Ambulatory Visit: Payer: Self-pay | Admitting: *Deleted

## 2023-02-03 MED ORDER — LEVOTHYROXINE SODIUM 125 MCG PO TABS: 125.0000 ug | ORAL_TABLET | Freq: Every day | ORAL | 1 refills | Status: DC

## 2023-02-11 ENCOUNTER — Encounter: Payer: Self-pay | Admitting: Physician Assistant

## 2023-02-11 ENCOUNTER — Ambulatory Visit: Payer: PPO | Attending: Physician Assistant | Admitting: Physician Assistant

## 2023-02-11 ENCOUNTER — Other Ambulatory Visit: Payer: Self-pay

## 2023-02-11 VITALS — BP 163/94 | HR 83 | Resp 12 | Ht 64.0 in | Wt 176.8 lb

## 2023-02-11 DIAGNOSIS — M5136 Other intervertebral disc degeneration, lumbar region: Secondary | ICD-10-CM | POA: Diagnosis not present

## 2023-02-11 DIAGNOSIS — M810 Age-related osteoporosis without current pathological fracture: Secondary | ICD-10-CM | POA: Diagnosis not present

## 2023-02-11 DIAGNOSIS — M797 Fibromyalgia: Secondary | ICD-10-CM | POA: Diagnosis not present

## 2023-02-11 DIAGNOSIS — Z79899 Other long term (current) drug therapy: Secondary | ICD-10-CM | POA: Diagnosis not present

## 2023-02-11 DIAGNOSIS — M19042 Primary osteoarthritis, left hand: Secondary | ICD-10-CM

## 2023-02-11 DIAGNOSIS — M5030 Other cervical disc degeneration, unspecified cervical region: Secondary | ICD-10-CM | POA: Diagnosis not present

## 2023-02-11 DIAGNOSIS — Z8639 Personal history of other endocrine, nutritional and metabolic disease: Secondary | ICD-10-CM | POA: Diagnosis not present

## 2023-02-11 DIAGNOSIS — Z8669 Personal history of other diseases of the nervous system and sense organs: Secondary | ICD-10-CM

## 2023-02-11 DIAGNOSIS — M0579 Rheumatoid arthritis with rheumatoid factor of multiple sites without organ or systems involvement: Secondary | ICD-10-CM

## 2023-02-11 DIAGNOSIS — Z8659 Personal history of other mental and behavioral disorders: Secondary | ICD-10-CM

## 2023-02-11 DIAGNOSIS — M19041 Primary osteoarthritis, right hand: Secondary | ICD-10-CM | POA: Diagnosis not present

## 2023-02-11 DIAGNOSIS — Z171 Estrogen receptor negative status [ER-]: Secondary | ICD-10-CM

## 2023-02-11 DIAGNOSIS — M51369 Other intervertebral disc degeneration, lumbar region without mention of lumbar back pain or lower extremity pain: Secondary | ICD-10-CM

## 2023-02-11 DIAGNOSIS — M5134 Other intervertebral disc degeneration, thoracic region: Secondary | ICD-10-CM | POA: Diagnosis not present

## 2023-02-11 DIAGNOSIS — C50412 Malignant neoplasm of upper-outer quadrant of left female breast: Secondary | ICD-10-CM

## 2023-02-11 DIAGNOSIS — Z96641 Presence of right artificial hip joint: Secondary | ICD-10-CM | POA: Diagnosis not present

## 2023-02-11 DIAGNOSIS — Z862 Personal history of diseases of the blood and blood-forming organs and certain disorders involving the immune mechanism: Secondary | ICD-10-CM

## 2023-02-11 MED ORDER — METHOTREXATE SODIUM CHEMO INJECTION 50 MG/2ML: INTRAMUSCULAR | 0 refills | Status: DC

## 2023-02-11 NOTE — Progress Notes (Unsigned)
Please review and sign no print MTX rx to reflect dose change that you advised today. Thanks!

## 2023-03-14 ENCOUNTER — Other Ambulatory Visit: Payer: Self-pay | Admitting: Family Medicine

## 2023-03-16 NOTE — Telephone Encounter (Signed)
Last filled on 12/16/22 #270 caps with 0 refills, next appt is scheduled for 03/29/23

## 2023-03-29 ENCOUNTER — Ambulatory Visit (INDEPENDENT_AMBULATORY_CARE_PROVIDER_SITE_OTHER): Payer: PPO | Admitting: Family Medicine

## 2023-03-29 ENCOUNTER — Encounter: Payer: Self-pay | Admitting: Family Medicine

## 2023-03-29 VITALS — BP 132/70 | HR 90 | Temp 98.2°F | Ht 64.0 in | Wt 176.0 lb

## 2023-03-29 DIAGNOSIS — R0602 Shortness of breath: Secondary | ICD-10-CM

## 2023-03-29 DIAGNOSIS — E559 Vitamin D deficiency, unspecified: Secondary | ICD-10-CM

## 2023-03-29 DIAGNOSIS — E538 Deficiency of other specified B group vitamins: Secondary | ICD-10-CM | POA: Insufficient documentation

## 2023-03-29 DIAGNOSIS — T451X5S Adverse effect of antineoplastic and immunosuppressive drugs, sequela: Secondary | ICD-10-CM | POA: Diagnosis not present

## 2023-03-29 DIAGNOSIS — I700 Atherosclerosis of aorta: Secondary | ICD-10-CM

## 2023-03-29 DIAGNOSIS — R5382 Chronic fatigue, unspecified: Secondary | ICD-10-CM | POA: Diagnosis not present

## 2023-03-29 DIAGNOSIS — R030 Elevated blood-pressure reading, without diagnosis of hypertension: Secondary | ICD-10-CM | POA: Diagnosis not present

## 2023-03-29 DIAGNOSIS — E039 Hypothyroidism, unspecified: Secondary | ICD-10-CM

## 2023-03-29 DIAGNOSIS — F341 Dysthymic disorder: Secondary | ICD-10-CM | POA: Diagnosis not present

## 2023-03-29 DIAGNOSIS — D649 Anemia, unspecified: Secondary | ICD-10-CM | POA: Diagnosis not present

## 2023-03-29 DIAGNOSIS — R600 Localized edema: Secondary | ICD-10-CM

## 2023-03-29 DIAGNOSIS — E611 Iron deficiency: Secondary | ICD-10-CM | POA: Insufficient documentation

## 2023-03-29 DIAGNOSIS — R5383 Other fatigue: Secondary | ICD-10-CM | POA: Insufficient documentation

## 2023-03-29 LAB — FERRITIN: Ferritin: 11.2 ng/mL (ref 10.0–291.0)

## 2023-03-29 LAB — COMPREHENSIVE METABOLIC PANEL
ALT: 16 U/L (ref 0–35)
AST: 17 U/L (ref 0–37)
Albumin: 3.8 g/dL (ref 3.5–5.2)
Alkaline Phosphatase: 82 U/L (ref 39–117)
BUN: 17 mg/dL (ref 6–23)
CO2: 22 mEq/L (ref 19–32)
Calcium: 9.2 mg/dL (ref 8.4–10.5)
Chloride: 105 mEq/L (ref 96–112)
Creatinine, Ser: 0.72 mg/dL (ref 0.40–1.20)
GFR: 81.81 mL/min (ref >60.00)
Glucose, Bld: 83 mg/dL (ref 70–99)
Potassium: 4.8 mEq/L (ref 3.5–5.1)
Sodium: 137 mEq/L (ref 135–145)
Total Bilirubin: 0.4 mg/dL (ref 0.2–1.2)
Total Protein: 6.4 g/dL (ref 6.0–8.3)

## 2023-03-29 LAB — VITAMIN B12: Vitamin B-12: 133 pg/mL — ABNORMAL LOW (ref 211–911)

## 2023-03-29 LAB — CBC WITH DIFFERENTIAL/PLATELET
Eosinophils Absolute: 53 cells/uL (ref 15–500)
Eosinophils Relative: 0.8 %
HCT: 38.1 % (ref 35.0–45.0)
Lymphs Abs: 1703 cells/uL (ref 850–3900)
MCHC: 30.7 g/dL — ABNORMAL LOW (ref 32.0–36.0)
MCV: 84.9 fL (ref 80.0–100.0)
MPV: 12 fL (ref 7.5–12.5)
Monocytes Relative: 9.8 %
Neutro Abs: 4145 cells/uL (ref 1500–7800)
Platelets: 363 10*3/uL (ref 140–400)
WBC: 6.6 10*3/uL (ref 3.8–10.8)

## 2023-03-29 LAB — VITAMIN D 25 HYDROXY (VIT D DEFICIENCY, FRACTURES): VITD: 35.15 ng/mL (ref 30.00–100.00)

## 2023-03-29 LAB — IRON: Iron: 35 ug/dL — ABNORMAL LOW (ref 42–145)

## 2023-03-29 NOTE — Assessment & Plan Note (Signed)
Lab Results  Component Value Date   TSH 1.27 02/02/2023   Continues levothyroxine 125 mcg daily  Is fatigued

## 2023-03-29 NOTE — Assessment & Plan Note (Signed)
Chronic  Mostly L - since her cancer treatment - at that time venous US was neg for DVT No new pain  Encouraged trial of support socks

## 2023-03-29 NOTE — Assessment & Plan Note (Signed)
Ongoing  May be multifactorial  Some mild anemia with methotrexate (dose was reduced) Labs ordered today

## 2023-03-29 NOTE — Assessment & Plan Note (Signed)
Per pt stable 

## 2023-03-29 NOTE — Assessment & Plan Note (Signed)
Better on 2nd check BP: 132/70  Continue to monitor

## 2023-03-29 NOTE — Assessment & Plan Note (Signed)
No clinical changes  Watching blood pressure (better on 2nd check after sitting) 

## 2023-03-29 NOTE — Patient Instructions (Signed)
Labs today for anemia and fatigue and vitamin D  Take care of yourself  Follow up with rheumatology as planned   If shortness of breath does not improve let me know   Support stockings - over the counter /knee highs if needed

## 2023-03-29 NOTE — Progress Notes (Signed)
Subjective:    Patient ID: Samantha Valentine, female    DOB: 21-Oct-1945, 77 y.o.   MRN: [id number]  HPI Pt presents for anemia and fatigue Some shortness of breath  Intermittent swelling of L leg -chronic   Wt Readings from Last 3 Encounters:  03/29/23 176 lb (79.8 kg)  03/03/2023 176 lb 12.8 oz (80.2 kg)  12/15/22 178 lb 8 oz (81 kg)   30.21 kg/m  Vitals:   03/29/23 1100 03/29/23 1131  BP: (!) 152/82 132/70  Pulse: 90   Temp: 98.2 F (36.8 C)   SpO2: 97%     Multifactorial fatigue Also mild anemia   Also some shortness of breath more than usual  Wonders about her heart  Has some leg swelling since age 39 on /off  Does NOT want to see cardiology    Taking Celebrex -not often (her cr went up so watching this)  Folate   Enbrel Methotrexate -did cut down from 8 to 6 mg due to anemia   Rheum visit was 2023/03/03  Was cleaning her deceased brother's house     Lab Results  Component Value Date   WBC 5.1 01/20/2023   HGB 11.1 (L) 01/20/2023   HCT 36.7 01/20/2023   MCV 85.3 01/20/2023   PLT 299 01/20/2023    CMP     Component Value Date/Time   NA 140 01/20/2023 0903   K 4.9 01/20/2023 0903   CL 107 01/20/2023 0903   CO2 23 01/20/2023 0903   GLUCOSE 94 01/20/2023 0903   BUN 14 01/20/2023 0903   CREATININE 0.66 01/20/2023 0903   CALCIUM 8.8 01/20/2023 0903   PROT 6.2 01/20/2023 0903   ALBUMIN 3.6 01/30/2021 0918   AST 12 01/20/2023 0903   ALT 10 01/20/2023 0903   ALKPHOS 85 01/30/2021 0918   BILITOT 0.4 01/20/2023 0903   GFR 86.64 12/15/2022 0944   EGFR 91 01/20/2023 0903   GFRNONAA 87 11/06/2020 1324   Hypothyroidism  Pt has no clinical changes  Lab Results  Component Value Date   TSH 1.27 02/02/2023    Levothyroxine 125 mcg daily   Vit D def Last vitamin D Lab Results  Component Value Date   VD25OH 24.99 (L) 12/15/2022  Taking 2000 iu daily  In sun a bit also    Patient Active Problem List   Diagnosis Date Noted   Mild anemia  03/29/2023   Fatigue 03/29/2023   Pedal edema 03/29/2023   Elevated blood pressure reading 03/29/2023   Shortness of breath 03/29/2023   Insomnia 12/15/2022   History of breast cancer 12/23/2021   Elevated glucose level 11/23/2018   Routine general medical examination at a health care facility 08/09/2018   DDD (degenerative disc disease), thoracolumbar 06/28/2018   DDD (degenerative disc disease), cervical 06/28/2018   Atherosclerosis of aorta (HCC) 03/11/2018   Sleep disturbance 06/18/2017   Tachycardia 05/25/2017   Estrogen deficiency 03/22/2017   Malignant neoplasm of upper-outer quadrant of left breast in female, estrogen receptor negative (HCC) 02/04/2017   Tendinopathy of right shoulder 01/25/2017   High risk medication use 09/29/2016   DJD (degenerative joint disease), cervical 09/29/2016   History of right hip replacement 09/29/2016   Eczema 07/13/2014   Adverse effect of immunosuppressive drug 04/27/2012   ANXIETY DEPRESSION 10/28/2008   Spinal stenosis 12/29/2007   Hypothyroidism 12/28/2007   Vitamin D deficiency 12/28/2007   HEARING LOSS 12/28/2007   Seropositive rheumatoid arthritis of multiple sites (HCC) 12/28/2007   DDD (degenerative disc  disease), lumbar 12/28/2007   Fibromyalgia 12/28/2007   Osteoporosis 12/28/2007   MIGRAINES, HX OF 12/28/2007   Past Medical History:  Diagnosis Date   Anxiety    Arthritis    RA   Breast cancer (HCC) 01/29/2017   Breast mass 1 year    Cancer (HCC) 01/29/2017   left breast INVASIVE MAMMARY CARCINOMA, ER/PR positive   Cataract 2019   resolved with surgery   Collagen vascular disease (HCC)    Rhematoid Arthritis   Complication of anesthesia    DDD (degenerative disc disease)    in neck   Depression    Dyspnea    Edema    FEET/LEGS   Emphysema of lung (HCC)    Fibromyalgia    GERD (gastroesophageal reflux disease)    NO MEDS   History of hiatal hernia    History of kidney stones    MULTIPLE KIDNEY STONES BIL    HOH (hard of hearing)    AIDS   Hypothyroidism    Macular degeneration, bilateral    Migraine    Osteoporosis    Palpitations    Personal history of chemotherapy    prior to mastectomy   PONV (postoperative nausea and vomiting)    AFTER FIRST CATARACT   Rheumatoid arthritis (HCC)    Past Surgical History:  Procedure Laterality Date   BREAST BIOPSY Left 01/29/2017   US guided biopsy INVASIVE MAMMARY CARCINOMA   CATARACT EXTRACTION W/PHACO Left 04/28/2018   Procedure: CATARACT EXTRACTION PHACO AND INTRAOCULAR LENS PLACEMENT (IOC);  Surgeon: Lockie Mola, MD;  Location: [facility];  Service: Ophthalmology;  Laterality: Left;  Lot # [id number]   1:03 AP%   13.4 CDE    8.40   CATARACT EXTRACTION W/PHACO Right 06/09/2018   Procedure: CATARACT EXTRACTION PHACO AND INTRAOCULAR LENS PLACEMENT (IOC);  Surgeon: Lockie Mola, MD;  Location: [facility];  Service: Ophthalmology;  Laterality: Right;  lot# [id number] [place] 0:55 ap 16.3% cde 8.21   HIP ARTHROPLASTY     JOINT REPLACEMENT Right 2003   total hip replacement   LITHOTRIPSY  1997   kidney stone   MASTECTOMY Left 01/29/2017   MASTECTOMY W/ SENTINEL NODE BIOPSY Left 05/18/2017   Procedure: MASTECTOMY WITH SENTINEL LYMPH NODE BIOPSY;  Surgeon: Earline Mayotte, MD;  Location: [facility];  Service: General;  Laterality: Left;   PORT-A-CATH REMOVAL  09/2017   PORTACATH PLACEMENT Right 02/15/2017   Procedure: INSERTION PORT-A-CATH;  Surgeon: Earline Mayotte, MD;  Location: [facility];  Service: General;  Laterality: Right;   TONSILLECTOMY  1970   Social History   Tobacco Use   Smoking status: Former    Packs/day: 1.50    Years: 25.00    Additional pack years: 0.00    Total pack years: 37.50    Types: Cigarettes    Quit date: 10/20/1983    Years since quitting: 39.4   Smokeless tobacco: Never  Vaping Use   Vaping Use: Never used  Substance Use Topics   Alcohol use: No   Drug use: No   Family History  Problem Relation  Age of Onset   Hypertension Mother    Endometrial cancer Mother    Osteoarthritis Mother    Alcohol abuse Father    Diabetes Father    Stroke Father    Liver disease Sister    Breast cancer Maternal Aunt    Breast cancer Paternal Aunt 2   Rheum arthritis Maternal Grandmother    Diabetes Paternal Grandmother  Parkinson's disease Paternal Grandfather    Allergies  Allergen Reactions   Adhesive [Tape] Other (See Comments)    Skin blistered (PAPER TAPE OK)   Cymbalta [Duloxetine Hcl]     Headache Inc bp   Hydroxychloroquine Sulfate Rash   Ibandronate Sodium Rash   Risedronate Sodium Rash   Current Outpatient Medications on File Prior to Visit  Medication Sig Dispense Refill   aspirin EC 81 MG tablet Take 81 mg by mouth daily.     celecoxib (CELEBREX) 200 MG capsule TAKE 1 CAPSULE BY MOUTH TWICE DAILY WITH FOOD AS NEEDED 120 capsule 0   cholecalciferol (VITAMIN D) 1000 units tablet Take 1,000 Units by mouth daily.     etanercept (ENBREL SURECLICK) 50 MG/ML injection Inject 50 mg into the skin once a week. 12 mL 0   folic acid (FOLVITE) 1 MG tablet Take 2 tablets by mouth once daily 180 tablet 3   gabapentin (NEURONTIN) 100 MG capsule TAKE 1 CAPSULE BY MOUTH THREE TIMES DAILY 270 capsule 1   levothyroxine (SYNTHROID) 125 MCG tablet Take 1 tablet (125 mcg total) by mouth daily before breakfast. 90 tablet 1   LORazepam (ATIVAN) 1 MG tablet Take 1 tablet (1 mg total) by mouth at bedtime as needed for anxiety. 30 tablet 1   methotrexate 50 MG/2ML injection INJECT 0.6MLS INTO THE SKIN ONCE WEEKLY 8 mL 0   TUBERCULIN SYR 1CC/27GX1/2" (B-D TB SYRINGE 1CC/27GX1/2") 27G X 1/2" 1 ML MISC 12 Syringes by Does not apply route once a week. 12 each 3   No current facility-administered medications on file prior to visit.    Review of Systems  Constitutional:  Positive for fatigue. Negative for activity change, appetite change, fever and unexpected weight change.  HENT:  Negative for  congestion, ear pain, rhinorrhea, sinus pressure and sore throat.   Eyes:  Negative for pain, redness and visual disturbance.  Respiratory:  Positive for shortness of breath. Negative for cough, choking, chest tightness, wheezing and stridor.        ? Poor exercise tolerance   Cardiovascular:  Positive for leg swelling. Negative for chest pain and palpitations.       LLE is intermittently swollen for years   Gastrointestinal:  Negative for abdominal pain, blood in stool, constipation and diarrhea.  Endocrine: Negative for polydipsia and polyuria.  Genitourinary:  Negative for dysuria, frequency and urgency.  Musculoskeletal:  Negative for arthralgias, back pain and myalgias.  Skin:  Negative for pallor and rash.  Allergic/Immunologic: Negative for environmental allergies.  Neurological:  Negative for dizziness, syncope and headaches.  Hematological:  Negative for adenopathy. Does not bruise/bleed easily.  Psychiatric/Behavioral:  Negative for decreased concentration and dysphoric mood. The patient is not nervous/anxious.        Objective:   Physical Exam Constitutional:      General: She is not in acute distress.    Appearance: Normal appearance. She is well-developed. She is obese. She is not ill-appearing or diaphoretic.  HENT:     Head: Normocephalic and atraumatic.     Mouth/Throat:     Mouth: Mucous membranes are moist.  Eyes:     Conjunctiva/sclera: Conjunctivae normal.     Pupils: Pupils are equal, round, and reactive to light.  Neck:     Thyroid: No thyromegaly.     Vascular: No carotid bruit or JVD.  Cardiovascular:     Rate and Rhythm: Normal rate and regular rhythm.     Heart sounds: Normal heart sounds.  No gallop.  Pulmonary:     Effort: Pulmonary effort is normal. No respiratory distress.     Breath sounds: Normal breath sounds. No wheezing or rales.  Abdominal:     General: There is no distension or abdominal bruit.     Palpations: Abdomen is soft. There is  no mass.     Tenderness: There is no abdominal tenderness.  Musculoskeletal:     Cervical back: Normal range of motion and neck supple.     Right lower leg: No edema.     Left lower leg: No edema.  Lymphadenopathy:     Cervical: No cervical adenopathy.  Skin:    General: Skin is warm and dry.     Coloration: Skin is not jaundiced or pale.     Findings: No bruising or rash.  Neurological:     Mental Status: She is alert.     Cranial Nerves: No cranial nerve deficit.     Motor: No weakness.     Coordination: Coordination normal.     Deep Tendon Reflexes: Reflexes are normal and symmetric. Reflexes normal.  Psychiatric:        Mood and Affect: Mood normal.           Assessment & Plan:   Problem List Items Addressed This Visit       Cardiovascular and Mediastinum   Atherosclerosis of aorta (HCC)    No clinical changes  Watching blood pressure (better on 2nd check after sitting)        Endocrine   Hypothyroidism    Lab Results  Component Value Date   TSH 1.27 02/02/2023  Continues levothyroxine 125 mcg daily  Is fatigued         Other   Vitamin D deficiency    D level ordered Taking 2000 iu daily  Discussed importance to bone and overall health      Relevant Orders   VITAMIN D 25 Hydroxy (Vit-D Deficiency, Fractures)   Shortness of breath    This is intermittent Sometimes exertional/ related to fatigue  Pt declines cardiac w/u at this time but will return if not improved   Her LLE edema is unchanged and vitals are stable with normal pulse ox on RA      Pedal edema    Chronic  Mostly L - since her cancer treatment - at that time venous US was neg for DVT No new pain  Encouraged trial of support socks      Relevant Orders   Comprehensive metabolic panel   Mild anemia - Primary    Lab today  Takes mtx Also celebrex around once weekly   Very tired  Occ shortness of breath (declines card ref)      Relevant Orders   Pathologist smear review    Vitamin B12   Iron   Ferritin   Comprehensive metabolic panel   CBC with Differential/Platelet   Fatigue    Ongoing  May be multifactorial  Some mild anemia with methotrexate (dose was reduced) Labs ordered today         Relevant Orders   Vitamin B12   Iron   Comprehensive metabolic panel   CBC with Differential/Platelet   Elevated blood pressure reading    Better on 2nd check BP: 132/70  Continue to monitor      ANXIETY DEPRESSION    Per pt stable      Adverse effect of immunosuppressive drug    Methotrexate may be causing fatigue and anemia  Reviewed rheum note and dose was decreased Lab today

## 2023-03-29 NOTE — Assessment & Plan Note (Signed)
Methotrexate may be causing fatigue and anemia Reviewed rheum note and dose was decreased Lab today

## 2023-03-29 NOTE — Assessment & Plan Note (Signed)
D level ordered Taking 2000 iu daily  Discussed importance to bone and overall health

## 2023-03-29 NOTE — Assessment & Plan Note (Signed)
This is intermittent Sometimes exertional/ related to fatigue  Pt declines cardiac w/u at this time but will return if not improved   Her LLE edema is unchanged and vitals are stable with normal pulse ox on RA

## 2023-03-29 NOTE — Assessment & Plan Note (Signed)
Lab today  Takes mtx Also celebrex around once weekly   Very tired  Occ shortness of breath (declines card ref)

## 2023-03-30 LAB — CBC WITH DIFFERENTIAL/PLATELET
Absolute Monocytes: 647 cells/uL (ref 200–950)
Basophils Absolute: 53 cells/uL (ref 0–200)
Basophils Relative: 0.8 %
Hemoglobin: 11.7 g/dL (ref 11.7–15.5)
MCH: 26.1 pg — ABNORMAL LOW (ref 27.0–33.0)
Neutrophils Relative %: 62.8 %
RBC: 4.49 10*6/uL (ref 3.80–5.10)
RDW: 17.4 % — ABNORMAL HIGH (ref 11.0–15.0)
Total Lymphocyte: 25.8 %

## 2023-03-30 LAB — PATHOLOGIST SMEAR REVIEW

## 2023-04-07 ENCOUNTER — Ambulatory Visit (INDEPENDENT_AMBULATORY_CARE_PROVIDER_SITE_OTHER): Payer: PPO | Admitting: Family Medicine

## 2023-04-07 ENCOUNTER — Encounter: Payer: Self-pay | Admitting: Family Medicine

## 2023-04-07 VITALS — BP 135/75 | HR 87 | Temp 97.9°F | Ht 64.0 in | Wt 178.4 lb

## 2023-04-07 DIAGNOSIS — D649 Anemia, unspecified: Secondary | ICD-10-CM | POA: Diagnosis not present

## 2023-04-07 DIAGNOSIS — E538 Deficiency of other specified B group vitamins: Secondary | ICD-10-CM

## 2023-04-07 DIAGNOSIS — E611 Iron deficiency: Secondary | ICD-10-CM | POA: Diagnosis not present

## 2023-04-07 MED ORDER — CYANOCOBALAMIN 1000 MCG/ML IJ SOLN: INTRAMUSCULAR | 0 refills | Status: DC

## 2023-04-07 MED ORDER — "BD ECLIPSE SYRINGE 21G X 1"" 3 ML MISC": 1 refills | Status: DC

## 2023-04-07 MED ORDER — POLYSACCHARIDE IRON COMPLEX 150 MG PO CAPS: 150.0000 mg | ORAL_CAPSULE | Freq: Every day | ORAL | 3 refills | Status: DC

## 2023-04-07 MED ORDER — CYANOCOBALAMIN 1000 MCG/ML IJ SOLN
1000.0000 ug | Freq: Once | INTRAMUSCULAR | Status: AC
Start: 2023-04-07 — End: 2023-04-07
  Administered 2023-04-07: 1000 ug via INTRAMUSCULAR

## 2023-04-07 NOTE — Assessment & Plan Note (Signed)
Low B12 and low iron  ? If related to methotrexate Pt declines work up   Will treat  Re check 2 mo

## 2023-04-07 NOTE — Progress Notes (Signed)
Subjective:    Patient ID: Samantha Valentine, female    DOB: Mar 10, 1946, 77 y.o.   MRN: [id number]  HPI  Wt Readings from Last 3 Encounters:  04/07/23 178 lb 6.4 oz (80.9 kg)  03/29/23 176 lb (79.8 kg)  02/11/23 176 lb 12.8 oz (80.2 kg)   30.62 kg/m  Vitals:   04/07/23 0959 04/07/23 1037  BP: (!) 156/70 135/75  Pulse: 87   Temp: 97.9 F (36.6 C)   SpO2: 98%    Pr presents for follow up of B12 deficiency   BP Readings from Last 3 Encounters:  04/07/23 135/75  03/29/23 132/70  02/11/23 (!) 163/94     Lab Results  Component Value Date   WBC 6.6 03/29/2023   HGB 11.7 03/29/2023   HCT 38.1 03/29/2023   MCV 84.9 03/29/2023   PLT 363 03/29/2023   Lab Results  Component Value Date   VITAMINB12 133 (L) 03/29/2023   St to start B12 1000 mcg daily -feels a little better  Here to start shots   Has a fairly balance diet   Held methotrexate Friday also  Had cut dose before that  May start back at lower dose  Feels better off of it   Also mild iron def Lab Results  Component Value Date   IRON 35 (L) 03/29/2023   FERRITIN 11.2 03/29/2023   Path reviewed- microcytic/hypochromic   Takes folic acid   Does NOT want to look for a source of treatment   No blood in stool No GI symptoms      Takes celebrex seldomly-none in weeks    CMP     Component Value Date/Time   NA 137 03/29/2023 1145   K 4.8 03/29/2023 1145   CL 105 03/29/2023 1145   CO2 22 03/29/2023 1145   GLUCOSE 83 03/29/2023 1145   BUN 17 03/29/2023 1145   CREATININE 0.72 03/29/2023 1145   CREATININE 0.66 01/20/2023 0903   CALCIUM 9.2 03/29/2023 1145   PROT 6.4 03/29/2023 1145   ALBUMIN 3.8 03/29/2023 1145   AST 17 03/29/2023 1145   ALT 16 03/29/2023 1145   ALKPHOS 82 03/29/2023 1145   BILITOT 0.4 03/29/2023 1145   GFR 81.81 03/29/2023 1145   EGFR 91 01/20/2023 0903   GFRNONAA 87 11/06/2020 1324       Patient Active Problem List   Diagnosis Date Noted   Anemia 03/29/2023    Fatigue 03/29/2023   Pedal edema 03/29/2023   Elevated blood pressure reading 03/29/2023   Shortness of breath 03/29/2023   Vitamin B12 deficiency 03/29/2023   Low serum iron 03/29/2023   Insomnia 12/15/2022   History of breast cancer 12/23/2021   Elevated glucose level 11/23/2018   Routine general medical examination at a health care facility 08/09/2018   DDD (degenerative disc disease), thoracolumbar 06/28/2018   DDD (degenerative disc disease), cervical 06/28/2018   Atherosclerosis of aorta (HCC) 03/11/2018   Sleep disturbance 06/18/2017   Tachycardia 05/25/2017   Estrogen deficiency 03/22/2017   Malignant neoplasm of upper-outer quadrant of left breast in female, estrogen receptor negative (HCC) 02/04/2017   Tendinopathy of right shoulder 01/25/2017   High risk medication use 09/29/2016   DJD (degenerative joint disease), cervical 09/29/2016   History of right hip replacement 09/29/2016   Eczema 07/13/2014   Adverse effect of immunosuppressive drug 04/27/2012   ANXIETY DEPRESSION 10/28/2008   Spinal stenosis 12/29/2007   Hypothyroidism 12/28/2007   Vitamin D deficiency 12/28/2007   HEARING LOSS 12/28/2007  Seropositive rheumatoid arthritis of multiple sites (HCC) 12/28/2007   DDD (degenerative disc disease), lumbar 12/28/2007   Fibromyalgia 12/28/2007   Osteoporosis 12/28/2007   MIGRAINES, HX OF 12/28/2007   Past Medical History:  Diagnosis Date   Anxiety    Arthritis    RA   Breast cancer (HCC) 01/29/2017   Breast mass 1 year    Cancer (HCC) 01/29/2017   left breast INVASIVE MAMMARY CARCINOMA, ER/PR positive   Cataract 2019   resolved with surgery   Collagen vascular disease (HCC)    Rhematoid Arthritis   Complication of anesthesia    DDD (degenerative disc disease)    in neck   Depression    Dyspnea    Edema    FEET/LEGS   Emphysema of lung (HCC)    Fibromyalgia    GERD (gastroesophageal reflux disease)    NO MEDS   History of hiatal hernia    History  of kidney stones    MULTIPLE KIDNEY STONES BIL   HOH (hard of hearing)    AIDS   Hypothyroidism    Macular degeneration, bilateral    Migraine    Osteoporosis    Palpitations    Personal history of chemotherapy    prior to mastectomy   PONV (postoperative nausea and vomiting)    AFTER FIRST CATARACT   Rheumatoid arthritis (HCC)    Past Surgical History:  Procedure Laterality Date   BREAST BIOPSY Left 01/29/2017   US guided biopsy INVASIVE MAMMARY CARCINOMA   CATARACT EXTRACTION W/PHACO Left 04/28/2018   Procedure: CATARACT EXTRACTION PHACO AND INTRAOCULAR LENS PLACEMENT (IOC);  Surgeon: Lockie Mola, MD;  Location: [facility];  Service: Ophthalmology;  Laterality: Left;  Lot # [id number]   1:03 AP%   13.4 CDE    8.40   CATARACT EXTRACTION W/PHACO Right 06/09/2018   Procedure: CATARACT EXTRACTION PHACO AND INTRAOCULAR LENS PLACEMENT (IOC);  Surgeon: Lockie Mola, MD;  Location: [facility];  Service: Ophthalmology;  Laterality: Right;  lot# [id number] [place] 0:55 ap 16.3% cde 8.21   HIP ARTHROPLASTY     JOINT REPLACEMENT Right 2003   total hip replacement   LITHOTRIPSY  1997   kidney stone   MASTECTOMY Left 01/29/2017   MASTECTOMY W/ SENTINEL NODE BIOPSY Left 05/18/2017   Procedure: MASTECTOMY WITH SENTINEL LYMPH NODE BIOPSY;  Surgeon: Earline Mayotte, MD;  Location: [facility];  Service: General;  Laterality: Left;   PORT-A-CATH REMOVAL  09/2017   PORTACATH PLACEMENT Right 02/15/2017   Procedure: INSERTION PORT-A-CATH;  Surgeon: Earline Mayotte, MD;  Location: [facility];  Service: General;  Laterality: Right;   TONSILLECTOMY  1970   Social History   Tobacco Use   Smoking status: Former    Packs/day: 1.50    Years: 25.00    Additional pack years: 0.00    Total pack years: 37.50    Types: Cigarettes    Quit date: 10/20/1983    Years since quitting: 39.4   Smokeless tobacco: Never  Vaping Use   Vaping Use: Never used  Substance Use Topics   Alcohol use: No    Drug use: No   Family History  Problem Relation Age of Onset   Hypertension Mother    Endometrial cancer Mother    Osteoarthritis Mother    Alcohol abuse Father    Diabetes Father    Stroke Father    Liver disease Sister    Breast cancer Maternal Aunt    Breast cancer Paternal Aunt 86  Rheum arthritis Maternal Grandmother    Diabetes Paternal Grandmother    Parkinson's disease Paternal Grandfather    Allergies  Allergen Reactions   Adhesive [Tape] Other (See Comments)    Skin blistered (PAPER TAPE OK)   Cymbalta [Duloxetine Hcl]     Headache Inc bp   Hydroxychloroquine Sulfate Rash   Ibandronate Sodium Rash   Risedronate Sodium Rash   Current Outpatient Medications on File Prior to Visit  Medication Sig Dispense Refill   aspirin EC 81 MG tablet Take 81 mg by mouth daily.     celecoxib (CELEBREX) 200 MG capsule TAKE 1 CAPSULE BY MOUTH TWICE DAILY WITH FOOD AS NEEDED 120 capsule 0   cholecalciferol (VITAMIN D) 1000 units tablet Take 1,000 Units by mouth daily.     cyanocobalamin (VITAMIN B12) 1000 MCG tablet Take 1,000 mcg by mouth daily.     etanercept (ENBREL SURECLICK) 50 MG/ML injection Inject 50 mg into the skin once a week. 12 mL 0   folic acid (FOLVITE) 1 MG tablet Take 2 tablets by mouth once daily 180 tablet 3   gabapentin (NEURONTIN) 100 MG capsule TAKE 1 CAPSULE BY MOUTH THREE TIMES DAILY 270 capsule 1   levothyroxine (SYNTHROID) 125 MCG tablet Take 1 tablet (125 mcg total) by mouth daily before breakfast. 90 tablet 1   LORazepam (ATIVAN) 1 MG tablet Take 1 tablet (1 mg total) by mouth at bedtime as needed for anxiety. 30 tablet 1   methotrexate 50 MG/2ML injection INJECT 0.6MLS INTO THE SKIN ONCE WEEKLY 8 mL 0   TUBERCULIN SYR 1CC/27GX1/2" (B-D TB SYRINGE 1CC/27GX1/2") 27G X 1/2" 1 ML MISC 12 Syringes by Does not apply route once a week. 12 each 3   No current facility-administered medications on file prior to visit.    Review of Systems  Constitutional:   Positive for fatigue.  Gastrointestinal:  Negative for abdominal distention, abdominal pain, anal bleeding, blood in stool, constipation, diarrhea, nausea, rectal pain and vomiting.  Musculoskeletal:  Positive for arthralgias.  Neurological:  Negative for light-headedness and headaches.       Objective:   Physical Exam Constitutional:      General: She is not in acute distress.    Appearance: Normal appearance. She is obese. She is not ill-appearing or diaphoretic.  Eyes:     General:        Right eye: No discharge.        Left eye: No discharge.     Conjunctiva/sclera: Conjunctivae normal.     Pupils: Pupils are equal, round, and reactive to light.  Cardiovascular:     Rate and Rhythm: Normal rate and regular rhythm.  Pulmonary:     Effort: Pulmonary effort is normal.  Skin:    Coloration: Skin is not jaundiced or pale.     Findings: No bruising.  Neurological:     Mental Status: She is alert.     Sensory: No sensory deficit.     Motor: No weakness.     Coordination: Coordination normal.  Psychiatric:        Mood and Affect: Mood normal.           Assessment & Plan:   Problem List Items Addressed This Visit       Other   Vitamin B12 deficiency    New dx Lab Results  Component Value Date   VITAMINB12 133 (L) 03/29/2023  Will proceed with weekly B12 shots for 4 weeks Continue 1000 mcg daily (just started) over the  counter Balance diet ? If from methotrexate-pt will d/w rheumatology Hope she will feel bette with treatment  Re check level in 2 mo      Low serum iron    Unclear of cause  Lab Results  Component Value Date   IRON 35 (L) 03/29/2023   FERRITIN 11.2 03/29/2023   She does not want to work this up / declines endoscopy and denies GI symptoms) Px niferex 150 mg to take daily as tolerated Discussed ways to avoid constipation   Re check cbc in 2 mo  Pt will d/w rheum also      Anemia    Low B12 and low iron  ? If related to methotrexate Pt  declines work up   Will treat  Re check 2 mo      Relevant Medications   cyanocobalamin (VITAMIN B12) 1000 MCG tablet   iron polysaccharides (NIFEREX) 150 MG capsule   Other Visit Diagnoses     B12 deficiency    -  Primary   Relevant Medications   cyanocobalamin (VITAMIN B12) injection 1,000 mcg (Completed)

## 2023-04-07 NOTE — Assessment & Plan Note (Signed)
New dx Lab Results  Component Value Date   VITAMINB12 133 (L) 03/29/2023   Will proceed with weekly B12 shots for 4 weeks Continue 1000 mcg daily (just started) over the counter Balance diet ? If from methotrexate-pt will d/w rheumatology Hope she will feel bette with treatment  Re check level in 2 mo

## 2023-04-07 NOTE — Patient Instructions (Addendum)
B12 shot today  Let's do a shot weekly for 3 more weeks after that  Continue the 1000 mcg daily over the counter   Let's check your level in 2-3 months along with iron   Eat high iron foods   Try and take the niferex 150 mg daily as tolerated  Use a stool softener or miralax for constipation as needed   If symptoms do not improve in the meantime let me know   Take care of yourself!

## 2023-04-07 NOTE — Assessment & Plan Note (Signed)
Unclear of cause  Lab Results  Component Value Date   IRON 35 (L) 03/29/2023   FERRITIN 11.2 03/29/2023    She does not want to work this up / declines endoscopy and denies GI symptoms) Px niferex 150 mg to take daily as tolerated Discussed ways to avoid constipation   Re check cbc in 2 mo  Pt will d/w rheum also

## 2023-04-12 ENCOUNTER — Other Ambulatory Visit: Payer: Self-pay | Admitting: *Deleted

## 2023-04-12 MED ORDER — ENBREL SURECLICK 50 MG/ML ~~LOC~~ SOAJ: 50.0000 mg | SUBCUTANEOUS | 0 refills | Status: DC

## 2023-04-12 NOTE — Telephone Encounter (Signed)
Last Fill: 01/21/2023  Labs: 03/29/2023 MCH 26.1, MCHC 30.7, RDW 17.4  TB Gold: 09/16/2022 Neg    Next Visit: 05/19/2023  Last Visit: 02/11/2023  DX: Seropositive rheumatoid arthritis of multiple sites   Current Dose per office note 02/11/2023: Enbrel SureClick 50 mg sq injections every 7 days   Okay to refill Enbrel?

## 2023-04-12 NOTE — Telephone Encounter (Signed)
From: [name] To: Office of Gearldine Bienenstock, [place] Sent: 04/11/2023 6:52 PM EDT Subject: Medication Renewal Request  Refills have been requested for the following medications:   etanercept (ENBREL SURECLICK) 50 MG/ML injection Samantha Valentine]  Preferred pharmacy: [facility] [place], [place] - [address] Delivery method: Mail

## 2023-04-15 ENCOUNTER — Ambulatory Visit: Payer: PPO

## 2023-04-29 ENCOUNTER — Ambulatory Visit: Payer: PPO

## 2023-05-05 NOTE — Progress Notes (Signed)
Office Visit Note  Patient: Samantha Valentine             Date of Birth: 1946/05/11           MRN: [id number]             PCP: Judy Pimple, MD Referring: Tower, Audrie Gallus, MD Visit Date: 05/19/2023 Occupation: @GUAROCC @  Subjective:  Medication manage meant  History of Present Illness: Samantha Valentine is a 77 y.o. female with seropositive rheumatoid arthritis, osteoarthritis, fibromyalgia and osteoporosis.  She states she continues to have pain and discomfort in multiple joints.  She has been having pain in her bilateral feet.  She has not noticed any joint swelling.  She notices some swelling on her legs.  She has been taking Enbrel 50 mg subcu weekly and has reduced the dose of methotrexate to 0.5 mL subcu weekly.  Patient states she plans to get off methotrexate as she does not like the side effects of methotrexate.  She is currently on B12 supplement.  She states she developed B12 deficiency.  She is taking calcium and vitamin D.  She does not want to take any treatment for osteoporosis.  She continues to have dry mouth and dry eyes.  She has joint stiffness.  She ambulates with the help of a cane.    Activities of Daily Living:  Patient reports morning stiffness for 1 hour.   Patient Reports nocturnal pain.  Difficulty dressing/grooming: Reports Difficulty climbing stairs: Reports Difficulty getting out of chair: Reports Difficulty using hands for taps, buttons, cutlery, and/or writing: Reports  Review of Systems  Constitutional:  Positive for fatigue.  HENT:  Positive for mouth dryness. Negative for mouth sores.   Eyes:  Positive for dryness.  Respiratory:  Positive for shortness of breath. Negative for cough and wheezing.   Cardiovascular:  Negative for chest pain and palpitations.  Gastrointestinal:  Positive for constipation. Negative for blood in stool and diarrhea.  Endocrine: Negative for increased urination.  Genitourinary:  Negative for involuntary urination.   Musculoskeletal:  Positive for joint pain, gait problem, joint pain, joint swelling, myalgias, muscle weakness, morning stiffness, muscle tenderness and myalgias.  Skin:  Negative for color change, rash, hair loss and sensitivity to sunlight.  Allergic/Immunologic: Negative for susceptible to infections.  Neurological:  Positive for dizziness and headaches.  Hematological:  Negative for swollen glands.  Psychiatric/Behavioral:  Positive for depressed mood and sleep disturbance. The patient is nervous/anxious.     PMFS History:  Patient Active Problem List   Diagnosis Date Noted   Anemia 03/29/2023   Fatigue 03/29/2023   Pedal edema 03/29/2023   Elevated blood pressure reading 03/29/2023   Shortness of breath 03/29/2023   Vitamin B12 deficiency 03/29/2023   Low serum iron 03/29/2023   Insomnia 12/15/2022   History of breast cancer 12/23/2021   Elevated glucose level 11/23/2018   Routine general medical examination at a health care facility 08/09/2018   DDD (degenerative disc disease), thoracolumbar 06/28/2018   DDD (degenerative disc disease), cervical 06/28/2018   Atherosclerosis of aorta (HCC) 03/11/2018   Sleep disturbance 06/18/2017   Tachycardia 05/25/2017   Estrogen deficiency 03/22/2017   Malignant neoplasm of upper-outer quadrant of left breast in female, estrogen receptor negative (HCC) 02/04/2017   Tendinopathy of right shoulder 01/25/2017   High risk medication use 09/29/2016   DJD (degenerative joint disease), cervical 09/29/2016   History of right hip replacement 09/29/2016   Eczema 07/13/2014   Adverse effect of immunosuppressive  drug 04/27/2012   ANXIETY DEPRESSION 10/28/2008   Spinal stenosis 12/29/2007   Hypothyroidism 12/28/2007   Vitamin D deficiency 12/28/2007   HEARING LOSS 12/28/2007   Seropositive rheumatoid arthritis of multiple sites (HCC) 12/28/2007   DDD (degenerative disc disease), lumbar 12/28/2007   Fibromyalgia 12/28/2007   Osteoporosis  12/28/2007   MIGRAINES, HX OF 12/28/2007    Past Medical History:  Diagnosis Date   Anxiety    Arthritis    RA   Breast cancer (HCC) 01/29/2017   Breast mass 1 year    Cancer (HCC) 01/29/2017   left breast INVASIVE MAMMARY CARCINOMA, ER/PR positive   Cataract 2019   resolved with surgery   Collagen vascular disease (HCC)    Rhematoid Arthritis   Complication of anesthesia    DDD (degenerative disc disease)    in neck   Depression    Dyspnea    Edema    FEET/LEGS   Emphysema of lung (HCC)    Fibromyalgia    GERD (gastroesophageal reflux disease)    NO MEDS   History of hiatal hernia    History of kidney stones    MULTIPLE KIDNEY STONES BIL   HOH (hard of hearing)    AIDS   Hypothyroidism    Macular degeneration, bilateral    Migraine    Osteoporosis    Palpitations    Personal history of chemotherapy    prior to mastectomy   PONV (postoperative nausea and vomiting)    AFTER FIRST CATARACT   Rheumatoid arthritis (HCC)     Family History  Problem Relation Age of Onset   Hypertension Mother    Endometrial cancer Mother    Osteoarthritis Mother    Alcohol abuse Father    Diabetes Father    Stroke Father    Liver disease Sister    Breast cancer Maternal Aunt    Breast cancer Paternal Aunt 19   Rheum arthritis Maternal Grandmother    Diabetes Paternal Grandmother    Parkinson's disease Paternal Grandfather    Past Surgical History:  Procedure Laterality Date   BREAST BIOPSY Left 01/29/2017   US guided biopsy INVASIVE MAMMARY CARCINOMA   CATARACT EXTRACTION W/PHACO Left 04/28/2018   Procedure: CATARACT EXTRACTION PHACO AND INTRAOCULAR LENS PLACEMENT (IOC);  Surgeon: Lockie Mola, MD;  Location: [facility];  Service: Ophthalmology;  Laterality: Left;  Lot # [id number]   1:03 AP%   13.4 CDE    8.40   CATARACT EXTRACTION W/PHACO Right 06/09/2018   Procedure: CATARACT EXTRACTION PHACO AND INTRAOCULAR LENS PLACEMENT (IOC);  Surgeon: Lockie Mola,  MD;  Location: [facility];  Service: Ophthalmology;  Laterality: Right;  lot# [id number] [place] 0:55 ap 16.3% cde 8.21   HIP ARTHROPLASTY     JOINT REPLACEMENT Right 2003   total hip replacement   LITHOTRIPSY  1997   kidney stone   MASTECTOMY Left 01/29/2017   MASTECTOMY W/ SENTINEL NODE BIOPSY Left 05/18/2017   Procedure: MASTECTOMY WITH SENTINEL LYMPH NODE BIOPSY;  Surgeon: Earline Mayotte, MD;  Location: [facility];  Service: General;  Laterality: Left;   PORT-A-CATH REMOVAL  09/2017   PORTACATH PLACEMENT Right 02/15/2017   Procedure: INSERTION PORT-A-CATH;  Surgeon: Earline Mayotte, MD;  Location: [facility];  Service: General;  Laterality: Right;   TONSILLECTOMY  1970   Social History   Social History Narrative   Not on file   Immunization History  Administered Date(s) Administered   Influenza, High Dose Seasonal PF 07/28/2017, 07/17/2018, 07/26/2019  Influenza,inj,Quad PF,6+ Mos 07/05/2013, 07/13/2014, 08/20/2015   Influenza-Unspecified 07/19/2016, 07/28/2017, 07/17/2018   PFIZER(Purple Top)SARS-COV-2 Vaccination 10/30/2019, 11/18/2019, 06/14/2020   Pneumococcal Conjugate-13 08/20/2015   Pneumococcal Polysaccharide-23 11/03/2006, 07/05/2013   Td 05/03/2002     Objective: Vital Signs: BP (!) 163/89 (BP Location: Right Arm, Patient Position: Sitting, Cuff Size: Normal)   Pulse 89   Resp 15   Ht 5\' 4"  (1.626 m)   Wt 178 lb 12.8 oz (81.1 kg)   BMI 30.69 kg/m    Physical Exam Vitals and nursing note reviewed.  Constitutional:      Appearance: She is well-developed.  HENT:     Head: Normocephalic and atraumatic.  Eyes:     Conjunctiva/sclera: Conjunctivae normal.  Cardiovascular:     Rate and Rhythm: Normal rate and regular rhythm.     Heart sounds: Normal heart sounds.  Pulmonary:     Effort: Pulmonary effort is normal.     Breath sounds: Normal breath sounds.  Abdominal:     General: Bowel sounds are normal.     Palpations: Abdomen is soft.  Musculoskeletal:      Cervical back: Normal range of motion.     Right lower leg: Edema present.     Left lower leg: Edema present.  Lymphadenopathy:     Cervical: No cervical adenopathy.  Skin:    General: Skin is warm and dry.     Capillary Refill: Capillary refill takes less than 2 seconds.  Neurological:     Mental Status: She is alert and oriented to person, place, and time.  Psychiatric:        Behavior: Behavior normal.      Musculoskeletal Exam: She had limited lateral rotation of the cervical spine especially to the left side.  Shoulders, elbows, wrist joints were in good range of motion.  No MCP PIP or DIP synovitis was noted.  Bilateral CMC prominence was noted.  The right hip joint was replaced and was in good range of motion.  She had good range of motion of her left hip joint.  Knee joints were in good range of motion without any warmth swelling or effusion.  There was no tenderness over ankles or MTPs.  There was no Planter fasciitis.  She had bilateral significant pedal edema.  CDAI Exam: CDAI Score: -- Patient Global: 20 / 100; Provider Global: 20 / 100 Swollen: --; Tender: -- Joint Exam 05/19/2023   No joint exam has been documented for this visit   There is currently no information documented on the homunculus. Go to the Rheumatology activity and complete the homunculus joint exam.  Investigation: No additional findings.  Imaging: No results found.  Recent Labs: Lab Results  Component Value Date   WBC 6.6 03/29/2023   HGB 11.7 03/29/2023   PLT 363 03/29/2023   NA 137 03/29/2023   K 4.8 03/29/2023   CL 105 03/29/2023   CO2 22 03/29/2023   GLUCOSE 83 03/29/2023   BUN 17 03/29/2023   CREATININE 0.72 03/29/2023   BILITOT 0.4 03/29/2023   ALKPHOS 82 03/29/2023   AST 17 03/29/2023   ALT 16 03/29/2023   PROT 6.4 03/29/2023   ALBUMIN 3.8 03/29/2023   CALCIUM 9.2 03/29/2023   GFRAA 101 11/06/2020   QFTBGOLD NEGATIVE 09/03/2017   QFTBGOLDPLUS NEGATIVE 09/16/2022     Speciality Comments: No specialty comments available.  Procedures:  No procedures performed Allergies: Adhesive [tape], Cymbalta [duloxetine hcl], Hydroxychloroquine sulfate, Ibandronate sodium, and Risedronate sodium   Assessment / Plan:  Visit Diagnoses: Seropositive rheumatoid arthritis of multiple sites (HCC) - +RF, erosive disease: Patient reports ongoing pain and discomfort in her joints.  No synovitis was noted on the examination today.  She has been reducing dose of methotrexate as she does not like the side effects of methotrexate.  She is currently on methotrexate 0.5 mL subcu weekly along with folic acid 2 mg daily and Enbrel 50 mg subcu weekly.  She states that methotrexate is causing B12 deficiency.  I tried to convince her that methotrexate does not cause B12 deficiency.  I also discussed if she has a flare of rheumatoid arthritis she may have to increase the dose of methotrexate.  High risk medication use - Enbrel SureClick 50 mg sq injections every 7 days, methotrexate 0.6 ml sq once weekly, and folic acid 2 mg daily.(d/c SSZ in the past due to constipation).  Labs obtained on March 29, 2023 CBC and CMP were normal.  TB Gold was negative on September 16, 2022.  She was advised to get labs every 3 months to monitor for drug toxicity.  Information about immunization was placed in the AVS.  She was advised to hold Enbrel if she develops an infection and resume after the infection resolves.  Annual skin examination to screen for skin cancer was advised.  Use of sunscreen and sun protection was discussed.  Primary osteoarthritis of both hands-she has bilateral CMC PIP and DIP PIP thickening.  No synovitis was noted.  She denies any discomfort in her hands today.  History of right hip replacement-good range of motion without discomfort.  Pain in both feet-she complains of discomfort in her bilateral feet.  No synovitis was noted.  She has significant pedal edema in bilateral lower  extremities.  Use of compression socks was discussed.  She also has discomfort neuropathy.  DDD (degenerative disc disease), cervical-she had limited lateral rotation of the cervical spine with some discomfort.  DDD (degenerative disc disease), thoracic-thoracic kyphosis with no point tenderness was noted.  DDD (degenerative disc disease), lumbar-she has not returned discomfort in the lumbar spine.  Core strengthening exercises were discussed.  Fibromyalgia-she needs to have generalized pain and discomfort from fibromyalgia.  She is on gabapentin.  She takes Celebrex on as needed basis which she states about once a month.  Need for regular exercise and stretching was discussed.  Age-related osteoporosis without current pathological fracture - DEXA updated May 06, 2017 T score -2.4.  Patient was treated with Forteo and Prolia in the past.  Patient does not want to take any treatment for osteoporosis.  History of vitamin D deficiency-vitamin D was 35.15 in June 2024.  Use of vitamin D supplement and calcium was discussed.  Other medical problems listed as follows:  History of anemia  Malignant neoplasm of upper-outer quadrant of left breast in female, estrogen receptor negative (HCC)  History of hearing loss  History of depression  History of hypothyroidism - Followed by Dr. Milinda Antis.  History of anxiety  History of migraine  Orders: No orders of the defined types were placed in this encounter.  No orders of the defined types were placed in this encounter.    Follow-Up Instructions: Return in about 5 months (around 10/19/2023) for Rheumatoid arthritis, Osteoarthritis, Osteoporosis.   Pollyann Savoy, MD  Note - This record has been created using Animal nutritionist.  Chart creation errors have been sought, but may not always  have been located. Such creation errors do not reflect on  the standard of medical care.

## 2023-05-06 ENCOUNTER — Ambulatory Visit: Payer: PPO

## 2023-05-19 ENCOUNTER — Encounter: Payer: Self-pay | Admitting: Rheumatology

## 2023-05-19 ENCOUNTER — Ambulatory Visit: Payer: PPO | Attending: Rheumatology | Admitting: Rheumatology

## 2023-05-19 VITALS — BP 163/89 | HR 89 | Resp 15 | Ht 64.0 in | Wt 178.8 lb

## 2023-05-19 DIAGNOSIS — M5134 Other intervertebral disc degeneration, thoracic region: Secondary | ICD-10-CM | POA: Diagnosis not present

## 2023-05-19 DIAGNOSIS — M19042 Primary osteoarthritis, left hand: Secondary | ICD-10-CM

## 2023-05-19 DIAGNOSIS — M51369 Other intervertebral disc degeneration, lumbar region without mention of lumbar back pain or lower extremity pain: Secondary | ICD-10-CM

## 2023-05-19 DIAGNOSIS — Z96641 Presence of right artificial hip joint: Secondary | ICD-10-CM | POA: Diagnosis not present

## 2023-05-19 DIAGNOSIS — M810 Age-related osteoporosis without current pathological fracture: Secondary | ICD-10-CM

## 2023-05-19 DIAGNOSIS — M79671 Pain in right foot: Secondary | ICD-10-CM

## 2023-05-19 DIAGNOSIS — M5136 Other intervertebral disc degeneration, lumbar region: Secondary | ICD-10-CM

## 2023-05-19 DIAGNOSIS — Z79899 Other long term (current) drug therapy: Secondary | ICD-10-CM | POA: Diagnosis not present

## 2023-05-19 DIAGNOSIS — M797 Fibromyalgia: Secondary | ICD-10-CM | POA: Diagnosis not present

## 2023-05-19 DIAGNOSIS — M19041 Primary osteoarthritis, right hand: Secondary | ICD-10-CM

## 2023-05-19 DIAGNOSIS — Z8639 Personal history of other endocrine, nutritional and metabolic disease: Secondary | ICD-10-CM

## 2023-05-19 DIAGNOSIS — Z862 Personal history of diseases of the blood and blood-forming organs and certain disorders involving the immune mechanism: Secondary | ICD-10-CM | POA: Diagnosis not present

## 2023-05-19 DIAGNOSIS — Z171 Estrogen receptor negative status [ER-]: Secondary | ICD-10-CM

## 2023-05-19 DIAGNOSIS — C50412 Malignant neoplasm of upper-outer quadrant of left female breast: Secondary | ICD-10-CM

## 2023-05-19 DIAGNOSIS — M5030 Other cervical disc degeneration, unspecified cervical region: Secondary | ICD-10-CM

## 2023-05-19 DIAGNOSIS — M0579 Rheumatoid arthritis with rheumatoid factor of multiple sites without organ or systems involvement: Secondary | ICD-10-CM | POA: Diagnosis not present

## 2023-05-19 DIAGNOSIS — M79672 Pain in left foot: Secondary | ICD-10-CM

## 2023-05-19 DIAGNOSIS — Z8659 Personal history of other mental and behavioral disorders: Secondary | ICD-10-CM

## 2023-05-19 DIAGNOSIS — Z8669 Personal history of other diseases of the nervous system and sense organs: Secondary | ICD-10-CM

## 2023-05-19 NOTE — Patient Instructions (Signed)
Standing Labs We placed an order today for your standing lab work.   Please have your standing labs drawn in September and every 3 months  Please have your labs drawn 2 weeks prior to your appointment so that the provider can discuss your lab results at your appointment, if possible.  Please note that you may see your imaging and lab results in MyChart before we have reviewed them. We will contact you once all results are reviewed. Please allow our office up to 72 hours to thoroughly review all of the results before contacting the office for clarification of your results.  WALK-IN LAB HOURS  Monday through Thursday from 8:00 am -12:30 pm and 1:00 pm-5:00 pm and Friday from 8:00 am-12:00 pm.  Patients with office visits requiring labs will be seen before walk-in labs.  You may encounter longer than normal wait times. Please allow additional time. Wait times may be shorter on  Monday and Thursday afternoons.  We do not book appointments for walk-in labs. We appreciate your patience and understanding with our staff.   Labs are drawn by [facility]. Please bring your co-pay at the time of your lab draw.  You may receive a bill from [facility] for your lab work.  Please note if you are on Hydroxychloroquine and and an order has been placed for a Hydroxychloroquine level,  you will need to have it drawn 4 hours or more after your last dose.  If you wish to have your labs drawn at another location, please call the office 24 hours in advance so we can fax the orders.  The office is located at [address], [address], [place], [place] [address]   If you have any questions regarding directions or hours of operation,  please call [phone].   As a reminder, please drink plenty of water prior to coming for your lab work. Thanks!   Vaccines You are taking a medication(s) that can suppress your immune system.  The following immunizations are recommended: Flu annually Covid-19  RSV Td/Tdap (tetanus,  diphtheria, pertussis) every 10 years Pneumonia (Prevnar 15 then Pneumovax 23 at least 1 year apart.  Alternatively, can take Prevnar 20 without needing additional dose) Shingrix: 2 doses from 4 weeks to 6 months apart  Please check with your PCP to make sure you are up to date.   If you have signs or symptoms of an infection or start antibiotics: First, call your PCP for workup of your infection. Hold your medication through the infection, until you complete your antibiotics, and until symptoms resolve if you take the following: Injectable medication (Actemra, Benlysta, Cimzia, Cosentyx, Enbrel, Humira, Kevzara, Orencia, Remicade, Simponi, Stelara, Taltz, Tremfya) Methotrexate Leflunomide (Arava) Mycophenolate (Cellcept) Harriette Ohara, Olumiant, or Rinvoq

## 2023-05-19 NOTE — Progress Notes (Signed)
Medication Samples have been provided to the patient.  Drug name: enbrel sureclick       Strength: 50mg /mL        Qty: 4  LOT: [id number]  Exp.Date: 08/18/2025  Dosing instructions: Inject 50mg  into the skin once weekly.

## 2023-06-03 ENCOUNTER — Encounter (INDEPENDENT_AMBULATORY_CARE_PROVIDER_SITE_OTHER): Payer: Self-pay

## 2023-06-29 ENCOUNTER — Ambulatory Visit (INDEPENDENT_AMBULATORY_CARE_PROVIDER_SITE_OTHER): Payer: PPO | Admitting: Family Medicine

## 2023-06-29 ENCOUNTER — Encounter: Payer: Self-pay | Admitting: Family Medicine

## 2023-06-29 VITALS — BP 131/80 | HR 85 | Temp 97.7°F | Ht 64.0 in | Wt 179.4 lb

## 2023-06-29 DIAGNOSIS — Z23 Encounter for immunization: Secondary | ICD-10-CM | POA: Diagnosis not present

## 2023-06-29 DIAGNOSIS — H02846 Edema of left eye, unspecified eyelid: Secondary | ICD-10-CM | POA: Diagnosis not present

## 2023-06-29 DIAGNOSIS — D649 Anemia, unspecified: Secondary | ICD-10-CM | POA: Diagnosis not present

## 2023-06-29 DIAGNOSIS — E611 Iron deficiency: Secondary | ICD-10-CM | POA: Diagnosis not present

## 2023-06-29 DIAGNOSIS — Z79899 Other long term (current) drug therapy: Secondary | ICD-10-CM | POA: Diagnosis not present

## 2023-06-29 DIAGNOSIS — R030 Elevated blood-pressure reading, without diagnosis of hypertension: Secondary | ICD-10-CM

## 2023-06-29 DIAGNOSIS — E538 Deficiency of other specified B group vitamins: Secondary | ICD-10-CM | POA: Diagnosis not present

## 2023-06-29 DIAGNOSIS — H02849 Edema of unspecified eye, unspecified eyelid: Secondary | ICD-10-CM | POA: Insufficient documentation

## 2023-06-29 LAB — COMPREHENSIVE METABOLIC PANEL
ALT: 11 U/L (ref 0–35)
AST: 15 U/L (ref 0–37)
Albumin: 3.4 g/dL — ABNORMAL LOW (ref 3.5–5.2)
Alkaline Phosphatase: 79 U/L (ref 39–117)
BUN: 16 mg/dL (ref 6–23)
CO2: 26 meq/L (ref 19–32)
Calcium: 8.7 mg/dL (ref 8.4–10.5)
Chloride: 105 meq/L (ref 96–112)
Creatinine, Ser: 0.65 mg/dL (ref 0.40–1.20)
GFR: 85.99 mL/min (ref >60.00)
Glucose, Bld: 95 mg/dL (ref 70–99)
Potassium: 4.2 meq/L (ref 3.5–5.1)
Sodium: 139 meq/L (ref 135–145)
Total Bilirubin: 0.4 mg/dL (ref 0.2–1.2)
Total Protein: 6.7 g/dL (ref 6.0–8.3)

## 2023-06-29 LAB — FERRITIN: Ferritin: 12.4 ng/mL (ref 10.0–291.0)

## 2023-06-29 LAB — CBC WITH DIFFERENTIAL/PLATELET
Basophils Absolute: 0.1 10*3/uL (ref 0.0–0.1)
Basophils Relative: 0.7 % (ref 0.0–3.0)
Eosinophils Absolute: 0.2 10*3/uL (ref 0.0–0.7)
Eosinophils Relative: 2.9 % (ref 0.0–5.0)
HCT: 37.2 % (ref 36.0–46.0)
Hemoglobin: 11.6 g/dL — ABNORMAL LOW (ref 12.0–15.0)
Lymphocytes Relative: 26.9 % (ref 12.0–46.0)
Lymphs Abs: 1.8 10*3/uL (ref 0.7–4.0)
MCHC: 31.3 g/dL (ref 30.0–36.0)
MCV: 79 fl (ref 78.0–100.0)
Monocytes Absolute: 0.7 10*3/uL (ref 0.1–1.0)
Monocytes Relative: 10.1 % (ref 3.0–12.0)
Neutro Abs: 4 10*3/uL (ref 1.4–7.7)
Neutrophils Relative %: 59.4 % (ref 43.0–77.0)
Platelets: 313 10*3/uL (ref 150.0–400.0)
RBC: 4.71 Mil/uL (ref 3.87–5.11)
RDW: 19.4 % — ABNORMAL HIGH (ref 11.5–15.5)
WBC: 6.7 10*3/uL (ref 4.0–10.5)

## 2023-06-29 LAB — VITAMIN B12: Vitamin B-12: 921 pg/mL — ABNORMAL HIGH (ref 211–911)

## 2023-06-29 LAB — IRON: Iron: 31 ug/dL — ABNORMAL LOW (ref 42–145)

## 2023-06-29 NOTE — Progress Notes (Signed)
Subjective:    Patient ID: Samantha Valentine, female    DOB: 02/27/1946, 77 y.o.   MRN: [id number]  HPI  Wt Readings from Last 3 Encounters:  06/29/23 179 lb 6 oz (81.4 kg)  05/19/23 178 lb 12.8 oz (81.1 kg)  04/07/23 178 lb 6.4 oz (80.9 kg)   30.79 kg/m  Vitals:   06/29/23 0922 06/29/23 0947  BP: (!) 146/64 131/80  Pulse: 85   Temp: 97.7 F (36.5 C)   SpO2: 96%    Pt presents for follow up of B12 deficiency and iron def Also flu shot  Eye problem   Overall feels better than she was / energy wise  Still not 100%   Has issue with her left eye  Swollen/irritated Started as dry feeling eye  No change in vision  ? RA related -has had it before (keratitis in past)  Has oph appointment this month- for check of macular degeneration   Stopped methotrexate aug -for multiple reasons - not agreeing with her  Still on enbrel   Lab Results  Component Value Date   VITAMINB12 921 (H) 06/29/2023   Given a B12 shot weekly for one month  Instructed to take 1000 mcg daily over the counter   Had los iron also  Lab Results  Component Value Date   IRON 31 (L) 06/29/2023   FERRITIN 12.4 06/29/2023   But declined GI work up / further eval  Instructed to take niferex 150 mg dialy as tolerated - did not tolerate/ severe constipation despite miralax and stool softeners  Took it 10 days  Does take celebrex from rheum as needed   Lab Results  Component Value Date   WBC 6.7 06/29/2023   HGB 11.6 (L) 06/29/2023   HCT 37.2 06/29/2023   MCV 79.0 06/29/2023   PLT 313.0 06/29/2023         Patient Active Problem List   Diagnosis Date Noted   Swelling of eyelid 06/29/2023   Anemia 03/29/2023   Fatigue 03/29/2023   Pedal edema 03/29/2023   Elevated blood pressure reading 03/29/2023   Shortness of breath 03/29/2023   Vitamin B12 deficiency 03/29/2023   Low serum iron 03/29/2023   Insomnia 12/15/2022   History of breast cancer 12/23/2021   Elevated glucose level  11/23/2018   Routine general medical examination at a health care facility 08/09/2018   DDD (degenerative disc disease), thoracolumbar 06/28/2018   DDD (degenerative disc disease), cervical 06/28/2018   Atherosclerosis of aorta (HCC) 03/11/2018   Sleep disturbance 06/18/2017   Tachycardia 05/25/2017   Estrogen deficiency 03/22/2017   Malignant neoplasm of upper-outer quadrant of left breast in female, estrogen receptor negative (HCC) 02/04/2017   Tendinopathy of right shoulder 01/25/2017   High risk medication use 09/29/2016   DJD (degenerative joint disease), cervical 09/29/2016   History of right hip replacement 09/29/2016   Eczema 07/13/2014   Adverse effect of immunosuppressive drug 04/27/2012   ANXIETY DEPRESSION 10/28/2008   Spinal stenosis 12/29/2007   Hypothyroidism 12/28/2007   Vitamin D deficiency 12/28/2007   HEARING LOSS 12/28/2007   Seropositive rheumatoid arthritis of multiple sites (HCC) 12/28/2007   DDD (degenerative disc disease), lumbar 12/28/2007   Fibromyalgia 12/28/2007   Osteoporosis 12/28/2007   MIGRAINES, HX OF 12/28/2007   Past Medical History:  Diagnosis Date   Anxiety    Arthritis    RA   Breast cancer (HCC) 01/29/2017   Breast mass 1 year    Cancer (HCC) 01/29/2017   left  breast INVASIVE MAMMARY CARCINOMA, ER/PR positive   Cataract 2019   resolved with surgery   Collagen vascular disease (HCC)    Rhematoid Arthritis   Complication of anesthesia    DDD (degenerative disc disease)    in neck   Depression    Dyspnea    Edema    FEET/LEGS   Emphysema of lung (HCC)    Fibromyalgia    GERD (gastroesophageal reflux disease)    NO MEDS   History of hiatal hernia    History of kidney stones    MULTIPLE KIDNEY STONES BIL   HOH (hard of hearing)    AIDS   Hypothyroidism    Macular degeneration, bilateral    Migraine    Osteoporosis    Palpitations    Personal history of chemotherapy    prior to mastectomy   PONV (postoperative nausea and  vomiting)    AFTER FIRST CATARACT   Rheumatoid arthritis (HCC)    Past Surgical History:  Procedure Laterality Date   BREAST BIOPSY Left 01/29/2017   US guided biopsy INVASIVE MAMMARY CARCINOMA   CATARACT EXTRACTION W/PHACO Left 04/28/2018   Procedure: CATARACT EXTRACTION PHACO AND INTRAOCULAR LENS PLACEMENT (IOC);  Surgeon: Lockie Mola, MD;  Location: [facility];  Service: Ophthalmology;  Laterality: Left;  Lot # [id number]   1:03 AP%   13.4 CDE    8.40   CATARACT EXTRACTION W/PHACO Right 06/09/2018   Procedure: CATARACT EXTRACTION PHACO AND INTRAOCULAR LENS PLACEMENT (IOC);  Surgeon: Lockie Mola, MD;  Location: [facility];  Service: Ophthalmology;  Laterality: Right;  lot# [id number] [place] 0:55 ap 16.3% cde 8.21   HIP ARTHROPLASTY     JOINT REPLACEMENT Right 2003   total hip replacement   LITHOTRIPSY  1997   kidney stone   MASTECTOMY Left 01/29/2017   MASTECTOMY W/ SENTINEL NODE BIOPSY Left 05/18/2017   Procedure: MASTECTOMY WITH SENTINEL LYMPH NODE BIOPSY;  Surgeon: Earline Mayotte, MD;  Location: [facility];  Service: General;  Laterality: Left;   PORT-A-CATH REMOVAL  09/2017   PORTACATH PLACEMENT Right 02/15/2017   Procedure: INSERTION PORT-A-CATH;  Surgeon: Earline Mayotte, MD;  Location: [facility];  Service: General;  Laterality: Right;   TONSILLECTOMY  1970   Social History   Tobacco Use   Smoking status: Former    Current packs/day: 0.00    Average packs/day: 1.5 packs/day for 25.0 years (37.5 ttl pk-yrs)    Types: Cigarettes    Start date: 10/19/1958    Quit date: 10/20/1983    Years since quitting: 39.7    Passive exposure: Past   Smokeless tobacco: Never  Vaping Use   Vaping status: Never Used  Substance Use Topics   Alcohol use: No   Drug use: No   Family History  Problem Relation Age of Onset   Hypertension Mother    Endometrial cancer Mother    Osteoarthritis Mother    Alcohol abuse Father    Diabetes Father    Stroke Father    Liver disease  Sister    Breast cancer Maternal Aunt    Breast cancer Paternal Aunt 69   Rheum arthritis Maternal Grandmother    Diabetes Paternal Grandmother    Parkinson's disease Paternal Grandfather    Allergies  Allergen Reactions   Adhesive [Tape] Other (See Comments)    Skin blistered (PAPER TAPE OK)   Cymbalta [Duloxetine Hcl]     Headache Inc bp   Hydroxychloroquine Sulfate Rash   Ibandronate Sodium Rash  Risedronate Sodium Rash   Current Outpatient Medications on File Prior to Visit  Medication Sig Dispense Refill   aspirin EC 81 MG tablet Take 81 mg by mouth daily.     celecoxib (CELEBREX) 200 MG capsule TAKE 1 CAPSULE BY MOUTH TWICE DAILY WITH FOOD AS NEEDED 120 capsule 0   cholecalciferol (VITAMIN D) 1000 units tablet Take 1,000 Units by mouth daily.     cyanocobalamin (VITAMIN B12) 1000 MCG tablet Take 1,000 mcg by mouth daily.     etanercept (ENBREL SURECLICK) 50 MG/ML injection Inject 50 mg into the skin once a week. 12 mL 0   gabapentin (NEURONTIN) 100 MG capsule TAKE 1 CAPSULE BY MOUTH THREE TIMES DAILY 270 capsule 1   levothyroxine (SYNTHROID) 125 MCG tablet Take 1 tablet (125 mcg total) by mouth daily before breakfast. 90 tablet 1   LORazepam (ATIVAN) 1 MG tablet Take 1 tablet (1 mg total) by mouth at bedtime as needed for anxiety. 30 tablet 1   No current facility-administered medications on file prior to visit.    Review of Systems  Constitutional:  Positive for fatigue. Negative for activity change, appetite change, fever and unexpected weight change.  HENT:  Negative for congestion, ear pain, rhinorrhea, sinus pressure and sore throat.   Eyes:  Negative for pain, redness and visual disturbance.       Left eyelid is swollen Improved from several days ago   Respiratory:  Negative for cough, shortness of breath and wheezing.   Cardiovascular:  Negative for chest pain and palpitations.  Gastrointestinal:  Positive for constipation. Negative for abdominal pain, blood in  stool and diarrhea.  Endocrine: Negative for polydipsia and polyuria.  Genitourinary:  Negative for dysuria, frequency and urgency.  Musculoskeletal:  Positive for arthralgias. Negative for back pain and myalgias.  Skin:  Negative for pallor and rash.  Allergic/Immunologic: Negative for environmental allergies.  Neurological:  Negative for dizziness, syncope and headaches.  Hematological:  Negative for adenopathy. Does not bruise/bleed easily.  Psychiatric/Behavioral:  Negative for decreased concentration and dysphoric mood. The patient is not nervous/anxious.        Objective:   Physical Exam Constitutional:      General: She is not in acute distress.    Appearance: Normal appearance. She is well-developed. She is obese. She is not ill-appearing or diaphoretic.  HENT:     Head: Normocephalic and atraumatic.  Eyes:     General: No scleral icterus.       Right eye: No discharge.        Left eye: No discharge.     Extraocular Movements: Extraocular movements intact.     Conjunctiva/sclera: Conjunctivae normal.     Pupils: Pupils are equal, round, and reactive to light.     Comments: Left eyelid is mildly swollen Some crust in med corner  Slightly pink /not red  Eye itself appears normal   Neck:     Thyroid: No thyromegaly.     Vascular: No carotid bruit or JVD.  Cardiovascular:     Rate and Rhythm: Normal rate and regular rhythm.     Heart sounds: Normal heart sounds.     No gallop.  Pulmonary:     Effort: Pulmonary effort is normal. No respiratory distress.     Breath sounds: Normal breath sounds. No wheezing or rales.  Abdominal:     General: There is no distension or abdominal bruit.     Palpations: Abdomen is soft.  Musculoskeletal:     Cervical  back: Normal range of motion and neck supple.     Right lower leg: No edema.     Left lower leg: No edema.     Comments: RA joint changes   Lymphadenopathy:     Cervical: No cervical adenopathy.  Skin:    General: Skin is  warm and dry.     Coloration: Skin is not pale.     Findings: No rash.     Comments: Fair complexion    Neurological:     Mental Status: She is alert.     Coordination: Coordination normal.     Deep Tendon Reflexes: Reflexes are normal and symmetric. Reflexes normal.  Psychiatric:        Mood and Affect: Mood normal.           Assessment & Plan:   Problem List Items Addressed This Visit       Other   Anemia    Pt did not tolerate the iron after 10 days (even with miralax/ stool softener)  Declines GI work up No longer on methotrexate  Cbc and iron and ferritin today      Elevated blood pressure reading    Better on 2nd check  BP: 131/80  Will continue to monitor       High risk medication use    Added cmet to lab today Taking enbrel  Was recently on methotrexate (off of it now)      Relevant Orders   Comprehensive metabolic panel (Completed)   Low serum iron    Did not tolerate oral iron (GI) Re check today   Consider heme eval if  hb goes down further or symptomatic -may consider IV iron   Declines GI w/u      Swelling of eyelid    Mild swelling of left eyelid - medial  Per pt this is improving and has happened before (? Related to RA) No vision change or eye symptoms otherwise  Has appointment with eye specialist upcoming Call back and Er precautions noted in detail today         Vitamin B12 deficiency - Primary    Has had 4 weekly shots Taking 1000 mcg daily   Lab today for B12 level       Other Visit Diagnoses     Need for influenza vaccination       Relevant Orders   Flu Vaccine Trivalent High Dose (Fluad) (Completed)

## 2023-06-29 NOTE — Assessment & Plan Note (Signed)
Did not tolerate oral iron (GI) Re check today   Consider heme eval if  hb goes down further or symptomatic -may consider IV iron   Declines GI w/u

## 2023-06-29 NOTE — Patient Instructions (Addendum)
Labs today  Cmet and iron and B12 and cbc   We will make a plan from there   Take care of yourself   Get to the eye doctor as planned (earlier if condition worsens)     Flu shot today

## 2023-06-29 NOTE — Assessment & Plan Note (Signed)
Mild swelling of left eyelid - medial  Per pt this is improving and has happened before (? Related to RA) No vision change or eye symptoms otherwise  Has appointment with eye specialist upcoming Call back and Er precautions noted in detail today

## 2023-06-29 NOTE — Assessment & Plan Note (Signed)
Added cmet to lab today Taking enbrel  Was recently on methotrexate (off of it now)

## 2023-06-29 NOTE — Assessment & Plan Note (Signed)
Better on 2nd check  BP: 131/80  Will continue to monitor

## 2023-06-29 NOTE — Assessment & Plan Note (Signed)
Has had 4 weekly shots Taking 1000 mcg daily   Lab today for B12 level

## 2023-06-29 NOTE — Assessment & Plan Note (Signed)
Pt did not tolerate the iron after 10 days (even with miralax/ stool softener)  Declines GI work up No longer on methotrexate  Cbc and iron and ferritin today

## 2023-07-08 ENCOUNTER — Other Ambulatory Visit: Payer: Self-pay | Admitting: *Deleted

## 2023-07-08 MED ORDER — ENBREL SURECLICK 50 MG/ML ~~LOC~~ SOAJ: 50.0000 mg | SUBCUTANEOUS | 0 refills | Status: DC

## 2023-07-08 NOTE — Telephone Encounter (Signed)
Last Fill: 04/12/2023  Labs: 06/29/2023 Albumin 3.4, Hgb 11.6, RDW 19.4  TB Gold: 09/16/2022 Neg    Next Visit: 10/18/2023  Last Visit: 05/19/2023  [id number] rheumatoid arthritis of multiple sites   Current Dose per office note 05/19/2023: Enbrel SureClick 50 mg sq injections every 7 days,   Okay to refill Enbrel?

## 2023-07-16 DIAGNOSIS — H353132 Nonexudative age-related macular degeneration, bilateral, intermediate dry stage: Secondary | ICD-10-CM | POA: Diagnosis not present

## 2023-07-16 DIAGNOSIS — H35373 Puckering of macula, bilateral: Secondary | ICD-10-CM | POA: Diagnosis not present

## 2023-07-16 DIAGNOSIS — H43813 Vitreous degeneration, bilateral: Secondary | ICD-10-CM | POA: Diagnosis not present

## 2023-07-16 DIAGNOSIS — Z961 Presence of intraocular lens: Secondary | ICD-10-CM | POA: Diagnosis not present

## 2023-07-23 ENCOUNTER — Other Ambulatory Visit: Payer: Self-pay | Admitting: Family Medicine

## 2023-07-23 DIAGNOSIS — Z1212 Encounter for screening for malignant neoplasm of rectum: Secondary | ICD-10-CM

## 2023-07-23 DIAGNOSIS — Z1211 Encounter for screening for malignant neoplasm of colon: Secondary | ICD-10-CM

## 2023-08-07 ENCOUNTER — Other Ambulatory Visit: Payer: Self-pay | Admitting: Family Medicine

## 2023-08-09 DIAGNOSIS — Z1211 Encounter for screening for malignant neoplasm of colon: Secondary | ICD-10-CM | POA: Diagnosis not present

## 2023-08-09 DIAGNOSIS — Z1212 Encounter for screening for malignant neoplasm of rectum: Secondary | ICD-10-CM | POA: Diagnosis not present

## 2023-08-15 LAB — COLOGUARD: COLOGUARD: NEGATIVE

## 2023-09-08 ENCOUNTER — Other Ambulatory Visit: Payer: Self-pay | Admitting: Family Medicine

## 2023-09-08 ENCOUNTER — Telehealth: Payer: Self-pay | Admitting: Family Medicine

## 2023-09-08 DIAGNOSIS — E611 Iron deficiency: Secondary | ICD-10-CM

## 2023-09-08 DIAGNOSIS — E538 Deficiency of other specified B group vitamins: Secondary | ICD-10-CM

## 2023-09-08 DIAGNOSIS — D509 Iron deficiency anemia, unspecified: Secondary | ICD-10-CM

## 2023-09-08 NOTE — Telephone Encounter (Signed)
Last OV was a f/u on 06/29/23, Last filled on 03/16/23 #270 caps/ 1 refills

## 2023-09-08 NOTE — Telephone Encounter (Signed)
-----   Message from Lovena Neighbours sent at 09/08/2023  1:20 PM EST ----- Regarding: Labs for Thursday 11.21.24 Please put iron/cbc lab orders in future. Thank you, Denny Peon

## 2023-09-09 ENCOUNTER — Telehealth (INDEPENDENT_AMBULATORY_CARE_PROVIDER_SITE_OTHER): Payer: PPO | Admitting: Family Medicine

## 2023-09-09 ENCOUNTER — Other Ambulatory Visit (INDEPENDENT_AMBULATORY_CARE_PROVIDER_SITE_OTHER): Payer: PPO

## 2023-09-09 DIAGNOSIS — E538 Deficiency of other specified B group vitamins: Secondary | ICD-10-CM | POA: Diagnosis not present

## 2023-09-09 DIAGNOSIS — T451X5S Adverse effect of antineoplastic and immunosuppressive drugs, sequela: Secondary | ICD-10-CM

## 2023-09-09 DIAGNOSIS — E611 Iron deficiency: Secondary | ICD-10-CM

## 2023-09-09 DIAGNOSIS — D509 Iron deficiency anemia, unspecified: Secondary | ICD-10-CM | POA: Diagnosis not present

## 2023-09-09 LAB — COMPREHENSIVE METABOLIC PANEL
ALT: 11 U/L (ref 0–35)
AST: 15 U/L (ref 0–37)
Albumin: 3.8 g/dL (ref 3.5–5.2)
Alkaline Phosphatase: 75 U/L (ref 39–117)
BUN: 12 mg/dL (ref 6–23)
CO2: 25 meq/L (ref 19–32)
Calcium: 8.8 mg/dL (ref 8.4–10.5)
Chloride: 104 meq/L (ref 96–112)
Creatinine, Ser: 0.65 mg/dL (ref 0.40–1.20)
GFR: 85.88 mL/min (ref >60.00)
Glucose, Bld: 98 mg/dL (ref 70–99)
Potassium: 4.1 meq/L (ref 3.5–5.1)
Sodium: 137 meq/L (ref 135–145)
Total Bilirubin: 0.5 mg/dL (ref 0.2–1.2)
Total Protein: 6.5 g/dL (ref 6.0–8.3)

## 2023-09-09 LAB — CBC WITH DIFFERENTIAL/PLATELET
Basophils Absolute: 0.1 10*3/uL (ref 0.0–0.1)
Basophils Relative: 1 % (ref 0.0–3.0)
Eosinophils Absolute: 0.2 10*3/uL (ref 0.0–0.7)
Eosinophils Relative: 3.3 % (ref 0.0–5.0)
HCT: 36.2 % (ref 36.0–46.0)
Hemoglobin: 11.3 g/dL — ABNORMAL LOW (ref 12.0–15.0)
Lymphocytes Relative: 36.9 % (ref 12.0–46.0)
Lymphs Abs: 2.3 10*3/uL (ref 0.7–4.0)
MCHC: 31.2 g/dL (ref 30.0–36.0)
MCV: 76.4 fL — ABNORMAL LOW (ref 78.0–100.0)
Monocytes Absolute: 0.7 10*3/uL (ref 0.1–1.0)
Monocytes Relative: 10.5 % (ref 3.0–12.0)
Neutro Abs: 3.1 10*3/uL (ref 1.4–7.7)
Neutrophils Relative %: 48.3 % (ref 43.0–77.0)
Platelets: 313 10*3/uL (ref 150.0–400.0)
RBC: 4.75 Mil/uL (ref 3.87–5.11)
RDW: 18.8 % — ABNORMAL HIGH (ref 11.5–15.5)
WBC: 6.3 10*3/uL (ref 4.0–10.5)

## 2023-09-09 LAB — VITAMIN B12: Vitamin B-12: 1051 pg/mL — ABNORMAL HIGH (ref 211–911)

## 2023-09-09 LAB — IRON: Iron: 30 ug/dL — ABNORMAL LOW (ref 42–145)

## 2023-09-09 NOTE — Telephone Encounter (Signed)
Pt was in this morning for lab work wanted to know if a CMET could be added to her blood work for Dr. Reginia Forts office.

## 2023-09-09 NOTE — Telephone Encounter (Signed)
Order is in.

## 2023-09-13 ENCOUNTER — Telehealth: Payer: Self-pay | Admitting: Pharmacist

## 2023-09-13 NOTE — Telephone Encounter (Addendum)
Received Enbrel PAP renewal application. Provider portion signed by Dr. Corliss Skains. Scanned patient portion, MD portion, income docs, med list, insurance card copy to [place]  Will need to initiate Enbrel PA renewal after 09/15/23 (HTA is not submitting on CMM)  Samantha Valentine, PharmD, MPH, BCPS, CPP Clinical Pharmacist (Rheumatology and Pulmonology)

## 2023-09-22 NOTE — Telephone Encounter (Signed)
Received notification from [facility] [employer] regarding a prior authorization for ENBREL. Authorization has been APPROVED from 09/22/23 to 09/21/24. Approval letter sent to scan center.  Authorization # [id number]  Submitted Patient Assistance RENEWAL Application to [employer] for ENBREL along with provider portion, patient portion, PA, medication list, insurance card copy and income documents. Will update patient when we receive a response.  Phone #: [phone] Fax #: [phone]   Chesley Mires, PharmD, MPH, BCPS, CPP Clinical Pharmacist (Rheumatology and Pulmonology)

## 2023-09-22 NOTE — Telephone Encounter (Signed)
Submitted a Prior Authorization request to [facility] [employer]VANT[employer]/RX ADVANCE for ENBREL via CoverMyMeds. Will update once we receive a response.  Key: [id number]

## 2023-09-24 ENCOUNTER — Other Ambulatory Visit: Payer: Self-pay | Admitting: *Deleted

## 2023-09-24 MED ORDER — ENBREL SURECLICK 50 MG/ML ~~LOC~~ SOAJ: 50.0000 mg | SUBCUTANEOUS | 0 refills | Status: DC

## 2023-09-24 NOTE — Telephone Encounter (Signed)
Last Fill: 07/08/2023  Labs: 09/09/2023 Hgb 11.3, MCV 76.4, RDW 18.8  TB Gold: 09/16/2022 Neg    Next Visit: 10/17/2022  Last Visit: 05/19/2023  [id number] rheumatoid arthritis of multiple sites   Current Dose per office note 05/19/2023: Enbrel SureClick 50 mg sq injections every 7 days  Patient advised she is due to update TB Gold.   Okay to refill Enbrel?

## 2023-10-04 NOTE — Progress Notes (Signed)
Office Visit Note  Patient: Samantha Valentine             Date of Birth: October 11, 1946           MRN: [id number]             PCP: Judy Pimple, MD Referring: Tower, Audrie Gallus, MD Visit Date: 10/18/2023 Occupation: @GUAROCC @  Subjective:  Medication monitoring  History of Present Illness: Samantha Valentine is a 77 y.o. female with history of seropositive rheumatoid arthritis, osteoarthritis, and fibromyalgia.  Patient remains on Enbrel SureClick 50 mg sq injections every 7 days.  She continues to tolerate Enbrel without any side effects or injection site reactions.  She experiences intermittent arthralgias and joint stiffness especially with the cooler weather temperatures.  Her morning stiffness has been lasting for about 2 hours daily.  She has been using a cane to assist with ambulation due to feeling unsteady on her feet at times.  She has been taking Celebrex sparingly for pain relief if needed.   She denies any recent or recurrent infections.     Activities of Daily Living:  Patient reports morning stiffness for 2 hours.   Patient Reports nocturnal pain.  Difficulty dressing/grooming: Reports Difficulty climbing stairs: Reports Difficulty getting out of chair: Reports Difficulty using hands for taps, buttons, cutlery, and/or writing: Reports  Review of Systems  Constitutional:  Positive for fatigue.  HENT:  Positive for mouth dryness. Negative for mouth sores.   Eyes:  Positive for dryness.  Respiratory:  Positive for shortness of breath.   Cardiovascular:  Negative for chest pain and palpitations.  Gastrointestinal:  Positive for constipation. Negative for blood in stool and diarrhea.  Endocrine: Negative for increased urination.  Genitourinary:  Negative for involuntary urination.  Musculoskeletal:  Positive for joint pain, gait problem, joint pain, joint swelling, myalgias, morning stiffness and myalgias. Negative for muscle weakness and muscle tenderness.  Skin:   Positive for color change, rash and hair loss. Negative for sensitivity to sunlight.  Allergic/Immunologic: Negative for susceptible to infections.  Neurological:  Negative for dizziness and headaches.  Hematological:  Negative for swollen glands.  Psychiatric/Behavioral:  Positive for depressed mood and sleep disturbance. The patient is nervous/anxious.     PMFS History:  Patient Active Problem List   Diagnosis Date Noted   Swelling of eyelid 06/29/2023   Anemia 03/29/2023   Fatigue 03/29/2023   Pedal edema 03/29/2023   Elevated blood pressure reading 03/29/2023   Shortness of breath 03/29/2023   Vitamin B12 deficiency 03/29/2023   Low serum iron 03/29/2023   Insomnia 12/15/2022   History of breast cancer 12/23/2021   Elevated glucose level 11/23/2018   Routine general medical examination at a health care facility 08/09/2018   DDD (degenerative disc disease), thoracolumbar 06/28/2018   DDD (degenerative disc disease), cervical 06/28/2018   Atherosclerosis of aorta (HCC) 03/11/2018   Sleep disturbance 06/18/2017   Tachycardia 05/25/2017   Estrogen deficiency 03/22/2017   Malignant neoplasm of upper-outer quadrant of left breast in female, estrogen receptor negative (HCC) 02/04/2017   Tendinopathy of right shoulder 01/25/2017   High risk medication use 09/29/2016   DJD (degenerative joint disease), cervical 09/29/2016   History of right hip replacement 09/29/2016   Eczema 07/13/2014   Adverse effect of immunosuppressive drug 04/27/2012   ANXIETY DEPRESSION 10/28/2008   Spinal stenosis 12/29/2007   Hypothyroidism 12/28/2007   Vitamin D deficiency 12/28/2007   HEARING LOSS 12/28/2007   Seropositive rheumatoid arthritis of multiple sites (HCC)  12/28/2007   DDD (degenerative disc disease), lumbar 12/28/2007   Fibromyalgia 12/28/2007   Osteoporosis 12/28/2007   MIGRAINES, HX OF 12/28/2007    Past Medical History:  Diagnosis Date   Anxiety    Arthritis    RA   Breast  cancer (HCC) 01/29/2017   Breast mass 1 year    Cancer (HCC) 01/29/2017   left breast INVASIVE MAMMARY CARCINOMA, ER/PR positive   Cataract 2019   resolved with surgery   Collagen vascular disease (HCC)    Rhematoid Arthritis   Complication of anesthesia    DDD (degenerative disc disease)    in neck   Depression    Dyspnea    Edema    FEET/LEGS   Emphysema of lung (HCC)    Fibromyalgia    GERD (gastroesophageal reflux disease)    NO MEDS   History of hiatal hernia    History of kidney stones    MULTIPLE KIDNEY STONES BIL   HOH (hard of hearing)    AIDS   Hypothyroidism    Macular degeneration, bilateral    Migraine    Osteoporosis    Palpitations    Personal history of chemotherapy    prior to mastectomy   PONV (postoperative nausea and vomiting)    AFTER FIRST CATARACT   Rheumatoid arthritis (HCC)     Family History  Problem Relation Age of Onset   Hypertension Mother    Endometrial cancer Mother    Osteoarthritis Mother    Alcohol abuse Father    Diabetes Father    Stroke Father    Liver disease Sister    Breast cancer Maternal Aunt    Breast cancer Paternal Aunt 19   Rheum arthritis Maternal Grandmother    Diabetes Paternal Grandmother    Parkinson's disease Paternal Grandfather    Past Surgical History:  Procedure Laterality Date   BREAST BIOPSY Left 01/29/2017   US guided biopsy INVASIVE MAMMARY CARCINOMA   CATARACT EXTRACTION W/PHACO Left 04/28/2018   Procedure: CATARACT EXTRACTION PHACO AND INTRAOCULAR LENS PLACEMENT (IOC);  Surgeon: Lockie Mola, MD;  Location: [facility];  Service: Ophthalmology;  Laterality: Left;  Lot # [id number]   1:03 AP%   13.4 CDE    8.40   CATARACT EXTRACTION W/PHACO Right 06/09/2018   Procedure: CATARACT EXTRACTION PHACO AND INTRAOCULAR LENS PLACEMENT (IOC);  Surgeon: Lockie Mola, MD;  Location: [facility];  Service: Ophthalmology;  Laterality: Right;  lot# [id number] [place] 0:55 ap 16.3% cde 8.21   HIP  ARTHROPLASTY     JOINT REPLACEMENT Right 2003   total hip replacement   LITHOTRIPSY  1997   kidney stone   MASTECTOMY Left 01/29/2017   MASTECTOMY W/ SENTINEL NODE BIOPSY Left 05/18/2017   Procedure: MASTECTOMY WITH SENTINEL LYMPH NODE BIOPSY;  Surgeon: Earline Mayotte, MD;  Location: [facility];  Service: General;  Laterality: Left;   PORT-A-CATH REMOVAL  09/2017   PORTACATH PLACEMENT Right 02/15/2017   Procedure: INSERTION PORT-A-CATH;  Surgeon: Earline Mayotte, MD;  Location: [facility];  Service: General;  Laterality: Right;   TONSILLECTOMY  1970   Social History   Social History Narrative   Not on file   Immunization History  Administered Date(s) Administered   Fluad Trivalent(High Dose 65+) 06/29/2023   Influenza, High Dose Seasonal PF 07/28/2017, 07/17/2018, 07/26/2019   Influenza,inj,Quad PF,6+ Mos 07/05/2013, 07/13/2014, 08/20/2015   Influenza-Unspecified 07/19/2016, 07/28/2017, 07/17/2018   PFIZER(Purple Top)SARS-COV-2 Vaccination 10/30/2019, 11/18/2019, 06/14/2020   Pneumococcal Conjugate-13 08/20/2015   Pneumococcal Polysaccharide-23  11/03/2006, 07/05/2013   Td 05/03/2002     Objective: Vital Signs: BP (!) 162/89 (BP Location: Right Arm, Patient Position: Sitting, Cuff Size: Normal)   Pulse 89   Resp 16   Ht 5\' 4"  (1.626 m)   Wt 181 lb (82.1 kg)   BMI 31.07 kg/m    Physical Exam Vitals and nursing note reviewed.  Constitutional:      Appearance: She is well-developed.  HENT:     Head: Normocephalic and atraumatic.  Eyes:     Conjunctiva/sclera: Conjunctivae normal.  Cardiovascular:     Rate and Rhythm: Normal rate and regular rhythm.     Heart sounds: Normal heart sounds.  Pulmonary:     Effort: Pulmonary effort is normal.     Breath sounds: Normal breath sounds.  Abdominal:     General: Bowel sounds are normal.     Palpations: Abdomen is soft.  Musculoskeletal:     Cervical back: Normal range of motion.     Right lower leg: Edema present.      Left lower leg: Edema present.  Skin:    General: Skin is warm and dry.     Capillary Refill: Capillary refill takes less than 2 seconds.  Neurological:     Mental Status: She is alert and oriented to person, place, and time.  Psychiatric:        Behavior: Behavior normal.      Musculoskeletal Exam: C-spine has limited range of motion with lateral rotation especially to the left.  Thoracic kyphosis noted.  Shoulder joints, elbow joints, wrist joints, MCPs, PIPs, DIPs have good range of motion with no synovitis.  PIP and DIP thickening consistent with osteoarthritis of both hands.  CMC joint prominence noted bilaterally.  Right hip replacement has good range of motion with no groin pain.  Left hip joint has good range of motion with no groin pain.  Knee joints have good range of motion with no effusion.  Ankle joints have good range of motion with no tenderness or joint swelling.  Edema noted in bilateral lower extremities.  CDAI Exam: CDAI Score: -- Patient Global: --; Provider Global: -- Swollen: --; Tender: -- Joint Exam 10/18/2023   No joint exam has been documented for this visit   There is currently no information documented on the homunculus. Go to the Rheumatology activity and complete the homunculus joint exam.  Investigation: No additional findings.  Imaging: No results found.  Recent Labs: Lab Results  Component Value Date   WBC 6.3 09/09/2023   HGB 11.3 (L) 09/09/2023   PLT 313.0 09/09/2023   NA 137 09/09/2023   K 4.1 09/09/2023   CL 104 09/09/2023   CO2 25 09/09/2023   GLUCOSE 98 09/09/2023   BUN 12 09/09/2023   CREATININE 0.65 09/09/2023   BILITOT 0.5 09/09/2023   ALKPHOS 75 09/09/2023   AST 15 09/09/2023   ALT 11 09/09/2023   PROT 6.5 09/09/2023   ALBUMIN 3.8 09/09/2023   CALCIUM 8.8 09/09/2023   GFRAA 101 11/06/2020   QFTBGOLD NEGATIVE 09/03/2017   QFTBGOLDPLUS NEGATIVE 09/16/2022    Speciality Comments: No specialty comments  available.  Procedures:  No procedures performed Allergies: Adhesive [tape], Cymbalta [duloxetine hcl], Hydroxychloroquine sulfate, Ibandronate sodium, and Risedronate sodium   Assessment / Plan:     Visit Diagnoses: Seropositive rheumatoid arthritis of multiple sites (HCC) - +RF, erosive disease: She has no synovitis on examination today.  She continues to experience intermittent arthralgias and joint stiffness especially with the  colder weather temperatures.  She has been taking Celebrex sparingly for pain relief.  Patient remains on Enbrel 50 mg sq injections every week.  She is tolerating Enbrel without any side effects or injection site reactions.  She has not had any recent interruptions in therapy.  No recent or recurrent infections.  Overall her rheumatoid arthritis remains stable on Enbrel as monotherapy.  No medication changes will be made at this time.  She was advised to notify us if she develops signs or symptoms of a flare.  She will follow-up in the office in 5 months or sooner if needed.  High risk medication use - Enbrel SureClick 50 mg sq injections every 7 days.  (d/c SSZ in the past due to constipation). - CBC and CMP updated on 09/09/23.  Her next lab work will be due in February and every 3 months.  Standing orders for CBC and CMP remain in place.   TB gold negative on 09/16/22.  Order for TB gold released today. No recent or recurrent infections.  Discussed the importance of holding enbrel and methotrexate if she develops signs or symptoms of an infection and to resume once the infection has completely cleared.   Plan: QuantiFERON-TB Gold Plus  Screening for tuberculosis -Order for TB gold released today.  Plan: QuantiFERON-TB Gold Plus  Primary osteoarthritis of both hands: PIP and DIP thickening consistent with osteoarthritis of both hands.  CMC joint prominence noted bilaterally.  Intermittent discomfort and stiffness involving both hands.  No active inflammation noted  today.  History of right hip replacement: Doing well.  No groin pain currently.  Pain in both feet: Pedal edema noted in bilateral lower extremities.  Good range of motion of both ankle joints with no joint tenderness or synovitis.  Patient is using a cane to assist with ambulation since she is felt more unsteady on her feet.  DDD (degenerative disc disease), cervical: Cervical spine has limited range of motion with laterall rotation.  No symptoms of radiculopathy at this time.  DDD (degenerative disc disease), thoracic: Postural thoracic kyphosis noted.  Degeneration of intervertebral disc of lumbar region without discogenic back pain or lower extremity pain: Using a cane to assist with ambulation.  Fibromyalgia: She experiences intermittent myalgias and muscle tenderness due to fibromyalgia.  Age-related osteoporosis without current pathological fracture - DEXA updated May 06, 2017 T score -2.4.  Patient was treated with Forteo and Prolia in the past. declined tx  History of vitamin D deficiency: He is taking vitamin D 1000 units daily.  Low iron -Iron panel will be updated today.  Results will be forwarded to her PCP Dr. Milinda Antis.  Plan: Fe+TIBC+Fer  Vitamin B12 deficiency -Vitamin B12 will be rechecked today.  Results will be forwarded to Dr. Milinda Antis as requested.  Plan: Vitamin B12  History of anemia: Chronic-hemoglobin 11.3 on 09/09/2023.  Iron low at 30 on 09/09/2023.  Iron panel will be rechecked today as requested.  Other medical conditions are listed as follows:  Malignant neoplasm of upper-outer quadrant of left breast in female, estrogen receptor negative (HCC)  History of hearing loss  History of depression  History of hypothyroidism  History of anxiety  History of migraine    Orders: Orders Placed This Encounter  Procedures   QuantiFERON-TB Gold Plus   Vitamin B12   Fe+TIBC+Fer   No orders of the defined types were placed in this encounter.  Follow-Up  Instructions: Return in about 5 months (around 03/17/2024) for Rheumatoid arthritis.   Ladona Ridgel  Fransisca Kaufmann, PA-C  Note - This record has been created using [employer].  Chart creation errors have been sought, but may not always  have been located. Such creation errors do not reflect on  the standard of medical care.

## 2023-10-18 ENCOUNTER — Encounter: Payer: Self-pay | Admitting: Physician Assistant

## 2023-10-18 ENCOUNTER — Ambulatory Visit: Payer: PPO | Attending: Physician Assistant | Admitting: Physician Assistant

## 2023-10-18 VITALS — BP 162/89 | HR 89 | Resp 16 | Ht 64.0 in | Wt 181.0 lb

## 2023-10-18 DIAGNOSIS — Z79899 Other long term (current) drug therapy: Secondary | ICD-10-CM | POA: Diagnosis not present

## 2023-10-18 DIAGNOSIS — Z171 Estrogen receptor negative status [ER-]: Secondary | ICD-10-CM

## 2023-10-18 DIAGNOSIS — Z96641 Presence of right artificial hip joint: Secondary | ICD-10-CM

## 2023-10-18 DIAGNOSIS — Z8669 Personal history of other diseases of the nervous system and sense organs: Secondary | ICD-10-CM

## 2023-10-18 DIAGNOSIS — Z862 Personal history of diseases of the blood and blood-forming organs and certain disorders involving the immune mechanism: Secondary | ICD-10-CM | POA: Diagnosis not present

## 2023-10-18 DIAGNOSIS — M5030 Other cervical disc degeneration, unspecified cervical region: Secondary | ICD-10-CM

## 2023-10-18 DIAGNOSIS — E538 Deficiency of other specified B group vitamins: Secondary | ICD-10-CM | POA: Diagnosis not present

## 2023-10-18 DIAGNOSIS — M19042 Primary osteoarthritis, left hand: Secondary | ICD-10-CM

## 2023-10-18 DIAGNOSIS — M79671 Pain in right foot: Secondary | ICD-10-CM

## 2023-10-18 DIAGNOSIS — M0579 Rheumatoid arthritis with rheumatoid factor of multiple sites without organ or systems involvement: Secondary | ICD-10-CM

## 2023-10-18 DIAGNOSIS — M797 Fibromyalgia: Secondary | ICD-10-CM | POA: Diagnosis not present

## 2023-10-18 DIAGNOSIS — M19041 Primary osteoarthritis, right hand: Secondary | ICD-10-CM

## 2023-10-18 DIAGNOSIS — M79672 Pain in left foot: Secondary | ICD-10-CM

## 2023-10-18 DIAGNOSIS — M51369 Other intervertebral disc degeneration, lumbar region without mention of lumbar back pain or lower extremity pain: Secondary | ICD-10-CM

## 2023-10-18 DIAGNOSIS — Z111 Encounter for screening for respiratory tuberculosis: Secondary | ICD-10-CM | POA: Diagnosis not present

## 2023-10-18 DIAGNOSIS — Z8639 Personal history of other endocrine, nutritional and metabolic disease: Secondary | ICD-10-CM

## 2023-10-18 DIAGNOSIS — M5134 Other intervertebral disc degeneration, thoracic region: Secondary | ICD-10-CM

## 2023-10-18 DIAGNOSIS — C50412 Malignant neoplasm of upper-outer quadrant of left female breast: Secondary | ICD-10-CM

## 2023-10-18 DIAGNOSIS — E611 Iron deficiency: Secondary | ICD-10-CM | POA: Diagnosis not present

## 2023-10-18 DIAGNOSIS — Z8659 Personal history of other mental and behavioral disorders: Secondary | ICD-10-CM

## 2023-10-18 DIAGNOSIS — M810 Age-related osteoporosis without current pathological fracture: Secondary | ICD-10-CM

## 2023-10-18 NOTE — Patient Instructions (Signed)

## 2023-10-19 NOTE — Progress Notes (Signed)
Vitamin B12--552.   Iron is low-25.  Ferritin is low.  Please notify the patient and forward results to PCP as requested.

## 2023-10-21 ENCOUNTER — Telehealth: Payer: Self-pay | Admitting: *Deleted

## 2023-10-21 LAB — VITAMIN B12: Vitamin B-12: 552 pg/mL (ref 200–1100)

## 2023-10-21 LAB — QUANTIFERON-TB GOLD PLUS
Mitogen-NIL: 6.63 [IU]/mL
NIL: 0.02 [IU]/mL
QuantiFERON-TB Gold Plus: NEGATIVE
TB1-NIL: 0.01 [IU]/mL
TB2-NIL: 0 [IU]/mL

## 2023-10-21 LAB — IRON,TIBC AND FERRITIN PANEL
%SAT: 6 % — ABNORMAL LOW (ref 16–45)
Ferritin: 8 ng/mL — ABNORMAL LOW (ref 16–288)
Iron: 25 ug/dL — ABNORMAL LOW (ref 45–160)
TIBC: 436 ug/dL (ref 250–450)

## 2023-10-21 NOTE — Telephone Encounter (Signed)
-----   Message from [place] sent at 10/20/2023  3:39 PM EST ----- Please let pt know iron  and iron  stores are still low  If she changes her mind about hematology opinion (possible iron  infusion) let us  know   B12 is better on this dose Keep taking it as you are ----- Message ----- From: Knute Reena DEL, RT Sent: 10/19/2023   9:06 AM EST To: Laine DELENA Balls, MD  Please review mutual patient's lab results. Thank you.

## 2023-10-21 NOTE — Telephone Encounter (Signed)
 Aware, thanks  B12 is noted as not taking on med list

## 2023-10-21 NOTE — Telephone Encounter (Signed)
 Pt notified of lab results and Dr. Graham comments. Pt did want PCP to know that she isn't taking any B12 right now, she ran out a few months ago and she wanted to see how her levels are doing before deciding if she was going to restart med. Pt said she feels fine and since labs in normal range she wants to stay off of the B12 tabs.  Pt said she will let us  know if she wants to proceed with referral at a later time but still declines hematology referral for now

## 2023-10-25 NOTE — Telephone Encounter (Signed)
 Received a fax from  [employer] regarding an approval for ENBREL patient assistance from 10/25/2023 to 10/18/2024. Approval letter sent to scan center.  Phone #: [phone] Fax #: [phone]

## 2023-12-15 ENCOUNTER — Other Ambulatory Visit: Payer: Self-pay | Admitting: Family Medicine

## 2023-12-15 NOTE — Progress Notes (Unsigned)
 Office Visit Note  Patient: Samantha Valentine             Date of Birth: 1946-08-09           MRN: [id number]             PCP: Judy Pimple, MD Referring: Tower, Audrie Gallus, MD Visit Date: 12/16/2023 Occupation: @GUAROCC @  Subjective:  Pain in joints  History of Present Illness: Samantha Valentine is a 78 y.o. female with seropositive rheumatoid arthritis, osteoarthritis and fibromyalgia.  She states she has been taking Enbrel 50 mg subcu weekly without any interruption.  She has been having increased pain and discomfort in her both hands and wrist joints.  She states the swelling started about 2 weeks ago which was much worse and has improved to some extent.  She has not had a flare in couple of years per patient.  She has occasional twinges in her shoulders and her knees.  She states this discomfort increased with the weather change.  She has been ambulating with the help of a cane.    Activities of Daily Living:  Patient reports morning stiffness for 2 hours.   Patient Reports nocturnal pain.  Difficulty dressing/grooming: Reports Difficulty climbing stairs: Reports Difficulty getting out of chair: Reports Difficulty using hands for taps, buttons, cutlery, and/or writing: Reports  Review of Systems  Constitutional:  Positive for fatigue.  HENT:  Positive for mouth dryness. Negative for mouth sores.   Eyes:  Positive for dryness.  Respiratory:  Negative for difficulty breathing.   Cardiovascular:  Negative for chest pain and palpitations.  Gastrointestinal:  Positive for constipation. Negative for blood in stool and diarrhea.  Endocrine: Negative for increased urination.  Genitourinary:  Negative for involuntary urination.  Musculoskeletal:  Positive for joint pain, gait problem, joint pain, joint swelling, myalgias, muscle weakness, morning stiffness, muscle tenderness and myalgias.  Skin:  Negative for color change, rash, hair loss and sensitivity to sunlight.   Allergic/Immunologic: Negative for susceptible to infections.  Neurological:  Positive for dizziness and headaches.  Hematological:  Negative for swollen glands.  Psychiatric/Behavioral:  Positive for depressed mood and sleep disturbance. The patient is nervous/anxious.     PMFS History:  Patient Active Problem List   Diagnosis Date Noted   Swelling of eyelid 06/29/2023   Anemia 03/29/2023   Fatigue 03/29/2023   Pedal edema 03/29/2023   Elevated blood pressure reading 03/29/2023   Shortness of breath 03/29/2023   Vitamin B12 deficiency 03/29/2023   Low serum iron 03/29/2023   Insomnia 12/15/2022   History of breast cancer 12/23/2021   Elevated glucose level 11/23/2018   Routine general medical examination at a health care facility 08/09/2018   DDD (degenerative disc disease), thoracolumbar 06/28/2018   DDD (degenerative disc disease), cervical 06/28/2018   Atherosclerosis of aorta (HCC) 03/11/2018   Sleep disturbance 06/18/2017   Tachycardia 05/25/2017   Estrogen deficiency 03/22/2017   Malignant neoplasm of upper-outer quadrant of left breast in female, estrogen receptor negative (HCC) 02/04/2017   Tendinopathy of right shoulder 01/25/2017   High risk medication use 09/29/2016   DJD (degenerative joint disease), cervical 09/29/2016   History of right hip replacement 09/29/2016   Eczema 07/13/2014   Adverse effect of immunosuppressive drug 04/27/2012   ANXIETY DEPRESSION 10/28/2008   Spinal stenosis 12/29/2007   Hypothyroidism 12/28/2007   Vitamin D deficiency 12/28/2007   HEARING LOSS 12/28/2007   Seropositive rheumatoid arthritis of multiple sites (HCC) 12/28/2007   DDD (degenerative disc  disease), lumbar 12/28/2007   Fibromyalgia 12/28/2007   Osteoporosis 12/28/2007   MIGRAINES, HX OF 12/28/2007    Past Medical History:  Diagnosis Date   Anxiety    Arthritis    RA   Breast cancer (HCC) 01/29/2017   Breast mass 1 year    Cancer (HCC) 01/29/2017   left breast  INVASIVE MAMMARY CARCINOMA, ER/PR positive   Cataract 2019   resolved with surgery   Collagen vascular disease (HCC)    Rhematoid Arthritis   Complication of anesthesia    DDD (degenerative disc disease)    in neck   Depression    Dyspnea    Edema    FEET/LEGS   Emphysema of lung (HCC)    Fibromyalgia    GERD (gastroesophageal reflux disease)    NO MEDS   History of hiatal hernia    History of kidney stones    MULTIPLE KIDNEY STONES BIL   HOH (hard of hearing)    AIDS   Hypothyroidism    Macular degeneration, bilateral    Migraine    Osteoporosis    Palpitations    Personal history of chemotherapy    prior to mastectomy   PONV (postoperative nausea and vomiting)    AFTER FIRST CATARACT   Rheumatoid arthritis (HCC)     Family History  Problem Relation Age of Onset   Hypertension Mother    Endometrial cancer Mother    Osteoarthritis Mother    Alcohol abuse Father    Diabetes Father    Stroke Father    Liver disease Sister    Breast cancer Maternal Aunt    Breast cancer Paternal Aunt 5   Rheum arthritis Maternal Grandmother    Diabetes Paternal Grandmother    Parkinson's disease Paternal Grandfather    Past Surgical History:  Procedure Laterality Date   BREAST BIOPSY Left 01/29/2017   US guided biopsy INVASIVE MAMMARY CARCINOMA   CATARACT EXTRACTION W/PHACO Left 04/28/2018   Procedure: CATARACT EXTRACTION PHACO AND INTRAOCULAR LENS PLACEMENT (IOC);  Surgeon: Lockie Mola, MD;  Location: [facility];  Service: Ophthalmology;  Laterality: Left;  Lot # [id number]   1:03 AP%   13.4 CDE    8.40   CATARACT EXTRACTION W/PHACO Right 06/09/2018   Procedure: CATARACT EXTRACTION PHACO AND INTRAOCULAR LENS PLACEMENT (IOC);  Surgeon: Lockie Mola, MD;  Location: [facility];  Service: Ophthalmology;  Laterality: Right;  lot# [id number] [place] 0:55 ap 16.3% cde 8.21   HIP ARTHROPLASTY     JOINT REPLACEMENT Right 2003   total hip replacement   LITHOTRIPSY  1997    kidney stone   MASTECTOMY Left 01/29/2017   MASTECTOMY W/ SENTINEL NODE BIOPSY Left 05/18/2017   Procedure: MASTECTOMY WITH SENTINEL LYMPH NODE BIOPSY;  Surgeon: Earline Mayotte, MD;  Location: [facility];  Service: General;  Laterality: Left;   PORT-A-CATH REMOVAL  09/2017   PORTACATH PLACEMENT Right 02/15/2017   Procedure: INSERTION PORT-A-CATH;  Surgeon: Earline Mayotte, MD;  Location: [facility];  Service: General;  Laterality: Right;   TONSILLECTOMY  1970   Social History   Social History Narrative   Not on file   Immunization History  Administered Date(s) Administered   Fluad Trivalent(High Dose 65+) 06/29/2023   Influenza, High Dose Seasonal PF 07/28/2017, 07/17/2018, 07/26/2019   Influenza,inj,Quad PF,6+ Mos 07/05/2013, 07/13/2014, 08/20/2015   Influenza-Unspecified 07/19/2016, 07/28/2017, 07/17/2018   PFIZER(Purple Top)SARS-COV-2 Vaccination 10/30/2019, 11/18/2019, 06/14/2020   Pneumococcal Conjugate-13 08/20/2015   Pneumococcal Polysaccharide-23 11/03/2006, 07/05/2013   Td 05/03/2002  Objective: Vital Signs: BP (!) 161/84 (BP Location: Right Arm, Patient Position: Sitting, Cuff Size: Normal)   Pulse 66   Resp 14   Ht 5\' 4"  (1.626 m)   Wt 179 lb (81.2 kg)   BMI 30.73 kg/m    Physical Exam Vitals and nursing note reviewed.  Constitutional:      Appearance: She is well-developed.  HENT:     Head: Normocephalic and atraumatic.  Eyes:     Conjunctiva/sclera: Conjunctivae normal.  Cardiovascular:     Rate and Rhythm: Normal rate and regular rhythm.     Heart sounds: Normal heart sounds.  Pulmonary:     Effort: Pulmonary effort is normal.     Breath sounds: Normal breath sounds.  Abdominal:     General: Bowel sounds are normal.     Palpations: Abdomen is soft.  Musculoskeletal:     Cervical back: Normal range of motion.  Lymphadenopathy:     Cervical: No cervical adenopathy.  Skin:    General: Skin is warm and dry.     Capillary Refill: Capillary refill  takes less than 2 seconds.  Neurological:     Mental Status: She is alert and oriented to person, place, and time.  Psychiatric:        Behavior: Behavior normal.      Musculoskeletal Exam: She good range of motion of the cervical spine.  Thoracic kyphosis was noted.  There was no tenderness over thoracic or lumbar spine.  Shoulders and elbow joints in good range of motion.  She had synovitis of bilateral wrist joints, MCPs and PIPs as described below.  She also had warmth on palpation of her knee joints.  Hip joints and knee joints in good range of motion.  There was no tenderness over ankles or MTPs.  CDAI Exam: CDAI Score: 40  Patient Global: 80 / 100; Provider Global: 60 / 100 Swollen: 12 ; Tender: 14  Joint Exam 12/16/2023      Right  Left  Wrist  Swollen Tender  Swollen Tender  MCP 2  Swollen Tender  Swollen Tender  MCP 3  Swollen Tender  Swollen Tender  PIP 2 (finger)  Swollen Tender  Swollen Tender  PIP 3 (finger)  Swollen Tender  Swollen Tender  PIP 4 (finger)  Swollen Tender  Swollen Tender  Knee   Tender   Tender     Investigation: No additional findings.  Imaging: No results found.  Recent Labs: Lab Results  Component Value Date   WBC 6.3 09/09/2023   HGB 11.3 (L) 09/09/2023   PLT 313.0 09/09/2023   NA 137 09/09/2023   K 4.1 09/09/2023   CL 104 09/09/2023   CO2 25 09/09/2023   GLUCOSE 98 09/09/2023   BUN 12 09/09/2023   CREATININE 0.65 09/09/2023   BILITOT 0.5 09/09/2023   ALKPHOS 75 09/09/2023   AST 15 09/09/2023   ALT 11 09/09/2023   PROT 6.5 09/09/2023   ALBUMIN 3.8 09/09/2023   CALCIUM 8.8 09/09/2023   GFRAA 101 11/06/2020   QFTBGOLD NEGATIVE 09/03/2017   QFTBGOLDPLUS NEGATIVE 10/18/2023    Speciality Comments: No specialty comments available.  Procedures:  No procedures performed Allergies: Adhesive [tape], Cymbalta [duloxetine hcl], Hydroxychloroquine sulfate, Ibandronate sodium, and Risedronate sodium   Assessment / Plan:     Visit  Diagnoses: Seropositive rheumatoid arthritis of multiple sites (HCC) - +RF, erosive disease: -She is having a flare of rheumatoid arthritis with increased pain and swelling in her wrist joints and hands.  She also has discomfort in her shoulders and her knees.  Patient states she has not had a flare in 2 years.  She states she has been taking Enbrel on a regular basis.  She requested a prednisone taper.  Side effects of prednisone including weight gain, cataracts, diabetes, hypertension, osteoporosis were discussed.  A prescription for prednisone 5 mg tablets, 20 mg p.o. daily for 4 days and taper by 5 mg every 4 days was sent.  Plan: predniSONE (DELTASONE) 5 MG tablet  High risk medication use - Enbrel SureClick 50 mg sq injections every 7 days. (d/c SSZ in the past due to constipation). -Last CBC and CMP were in November 2024.  She was anemic.  Will repeat labs today.  TB Gold was negative on October 18, 2023.  Information on immunization was placed in the AVS.  She was advised to hold Enbrel if she develops an infection resume after the infection resolves.  Annual skin examination to screen for skin cancer was advised.  She was advised to use sunscreen and sun protection.  Plan: CBC with Differential/Platelet, COMPLETE METABOLIC PANEL WITH GFR  Primary osteoarthritis of both hands-she has rheumatoid arthritis and osteoarthritis overlap.  She had discomfort of her CMC, MCP and PIP joints with synovitis in multiple joints as described above.  A prednisone taper was sent.  History of right hip replacement-she had good range of motion without discomfort.  Pain in both feet-she denies discomfort today.  DDD (degenerative disc disease), cervical-she had good range of motion without discomfort.  DDD (degenerative disc disease), thoracic-thoracic process was noted without any point tenderness.  Degeneration of intervertebral disc of lumbar region without discogenic back pain or lower extremity pain-she is  not tender at lower back pain.  Fibromyalgia-she cannot history of generalized pain and discomfort.  Age-related osteoporosis without current pathological fracture - DEXA updated May 06, 2017 T score -2.4.  Patient was treated with Forteo and Prolia in the past. declined tx  History of vitamin D deficiency-vitamin D was normal in June 2024.  History of anemia-patient requested iron studies today.  Malignant neoplasm of upper-outer quadrant of left breast in female, estrogen receptor negative (HCC)  History of hearing loss  History of depression  History of anxiety  History of hypothyroidism  History of migraine  Vitamin B12 deficiency  Dyslipidemia - Plan: Lipid panel  Iron deficiency - Plan: Iron, TIBC and Ferritin Panel  Orders: Orders Placed This Encounter  Procedures   CBC with Differential/Platelet   COMPLETE METABOLIC PANEL WITH GFR   Lipid panel   Iron, TIBC and Ferritin Panel   Meds ordered this encounter  Medications   predniSONE (DELTASONE) 5 MG tablet    Sig: Take 4 tabs po x 4 days, 3  tabs po x 4 days, 2  tabs po x 4 days, 1  tab po x 4 days    Dispense:  40 tablet    Refill:  0     Follow-Up Instructions: Return in about 5 months (around 05/14/2024) for Rheumatoid arthritis.   Pollyann Savoy, MD  Note - This record has been created using Animal nutritionist.  Chart creation errors have been sought, but may not always  have been located. Such creation errors do not reflect on  the standard of medical care.

## 2023-12-15 NOTE — Telephone Encounter (Signed)
 Name of Medication: Ativan Name of Pharmacy: [facility] [address] Last Fill or Written Date and Quantity: 12/15/22 #30 tabs/ 1 refill Last Office Visit and Type: f/u 06/29/23 Next Office Visit and Type: none scheduled

## 2023-12-16 ENCOUNTER — Encounter: Payer: Self-pay | Admitting: Rheumatology

## 2023-12-16 ENCOUNTER — Ambulatory Visit: Payer: PPO | Attending: Rheumatology | Admitting: Rheumatology

## 2023-12-16 VITALS — BP 161/84 | HR 66 | Resp 14 | Ht 64.0 in | Wt 179.0 lb

## 2023-12-16 DIAGNOSIS — E538 Deficiency of other specified B group vitamins: Secondary | ICD-10-CM

## 2023-12-16 DIAGNOSIS — E611 Iron deficiency: Secondary | ICD-10-CM

## 2023-12-16 DIAGNOSIS — Z171 Estrogen receptor negative status [ER-]: Secondary | ICD-10-CM

## 2023-12-16 DIAGNOSIS — M51369 Other intervertebral disc degeneration, lumbar region without mention of lumbar back pain or lower extremity pain: Secondary | ICD-10-CM | POA: Diagnosis not present

## 2023-12-16 DIAGNOSIS — M810 Age-related osteoporosis without current pathological fracture: Secondary | ICD-10-CM

## 2023-12-16 DIAGNOSIS — Z79899 Other long term (current) drug therapy: Secondary | ICD-10-CM | POA: Diagnosis not present

## 2023-12-16 DIAGNOSIS — Z8659 Personal history of other mental and behavioral disorders: Secondary | ICD-10-CM

## 2023-12-16 DIAGNOSIS — Z96641 Presence of right artificial hip joint: Secondary | ICD-10-CM | POA: Diagnosis not present

## 2023-12-16 DIAGNOSIS — C50412 Malignant neoplasm of upper-outer quadrant of left female breast: Secondary | ICD-10-CM

## 2023-12-16 DIAGNOSIS — M79671 Pain in right foot: Secondary | ICD-10-CM

## 2023-12-16 DIAGNOSIS — Z8639 Personal history of other endocrine, nutritional and metabolic disease: Secondary | ICD-10-CM | POA: Diagnosis not present

## 2023-12-16 DIAGNOSIS — E785 Hyperlipidemia, unspecified: Secondary | ICD-10-CM

## 2023-12-16 DIAGNOSIS — M19041 Primary osteoarthritis, right hand: Secondary | ICD-10-CM | POA: Diagnosis not present

## 2023-12-16 DIAGNOSIS — M5030 Other cervical disc degeneration, unspecified cervical region: Secondary | ICD-10-CM

## 2023-12-16 DIAGNOSIS — M797 Fibromyalgia: Secondary | ICD-10-CM

## 2023-12-16 DIAGNOSIS — M19042 Primary osteoarthritis, left hand: Secondary | ICD-10-CM

## 2023-12-16 DIAGNOSIS — M0579 Rheumatoid arthritis with rheumatoid factor of multiple sites without organ or systems involvement: Secondary | ICD-10-CM

## 2023-12-16 DIAGNOSIS — Z862 Personal history of diseases of the blood and blood-forming organs and certain disorders involving the immune mechanism: Secondary | ICD-10-CM

## 2023-12-16 DIAGNOSIS — M5134 Other intervertebral disc degeneration, thoracic region: Secondary | ICD-10-CM | POA: Diagnosis not present

## 2023-12-16 DIAGNOSIS — Z8669 Personal history of other diseases of the nervous system and sense organs: Secondary | ICD-10-CM

## 2023-12-16 DIAGNOSIS — M79672 Pain in left foot: Secondary | ICD-10-CM

## 2023-12-16 MED ORDER — PREDNISONE 5 MG PO TABS: ORAL_TABLET | ORAL | 0 refills | Status: DC

## 2023-12-16 NOTE — Patient Instructions (Signed)
 Standing Labs We placed an order today for your standing lab work.   Please have your standing labs drawn in May and every 3 months  Please have your labs drawn 2 weeks prior to your appointment so that the provider can discuss your lab results at your appointment, if possible.  Please note that you may see your imaging and lab results in MyChart before we have reviewed them. We will contact you once all results are reviewed. Please allow our office up to 72 hours to thoroughly review all of the results before contacting the office for clarification of your results.  WALK-IN LAB HOURS  Monday through Thursday from 8:00 am -12:30 pm and 1:00 pm-5:00 pm and Friday from 8:00 am-12:00 pm.  Patients with office visits requiring labs will be seen before walk-in labs.  You may encounter longer than normal wait times. Please allow additional time. Wait times may be shorter on  Monday and Thursday afternoons.  We do not book appointments for walk-in labs. We appreciate your patience and understanding with our staff.   Labs are drawn by [facility]. Please bring your co-pay at the time of your lab draw.  You may receive a bill from [facility] for your lab work.  Please note if you are on Hydroxychloroquine and and an order has been placed for a Hydroxychloroquine level,  you will need to have it drawn 4 hours or more after your last dose.  If you wish to have your labs drawn at another location, please call the office 24 hours in advance so we can fax the orders.  The office is located at [address], [address], [place], [place] [address]   If you have any questions regarding directions or hours of operation,  please call [phone].   As a reminder, please drink plenty of water prior to coming for your lab work. Thanks!   Vaccines You are taking a medication(s) that can suppress your immune system.  The following immunizations are recommended: Flu annually Covid-19  RSV Td/Tdap (tetanus,  diphtheria, pertussis) every 10 years Pneumonia (Prevnar 15 then Pneumovax 23 at least 1 year apart.  Alternatively, can take Prevnar 20 without needing additional dose) Shingrix: 2 doses from 4 weeks to 6 months apart  Please check with your PCP to make sure you are up to date.  If you have signs or symptoms of an infection or start antibiotics: First, call your PCP for workup of your infection. Hold your medication through the infection, until you complete your antibiotics, and until symptoms resolve if you take the following: Injectable medication (Actemra, Benlysta, Cimzia, Cosentyx, Enbrel, Humira, Kevzara, Orencia, Remicade, Simponi, Stelara, Taltz, Tremfya) Methotrexate Leflunomide (Arava) Mycophenolate (Cellcept) Harriette Ohara, Olumiant, or Rinvoq    Please get an annual skin examination to screen for skin cancer.  Please use sunscreen and sun protection while you are on Enbrel.

## 2023-12-17 LAB — COMPLETE METABOLIC PANEL WITH GFR
AG Ratio: 1.2 (calc) (ref 1.0–2.5)
ALT: 9 U/L (ref 6–29)
AST: 12 U/L (ref 10–35)
Albumin: 3.6 g/dL (ref 3.6–5.1)
Alkaline phosphatase (APISO): 81 U/L (ref 37–153)
BUN: 17 mg/dL (ref 7–25)
CO2: 26 mmol/L (ref 20–32)
Calcium: 9.4 mg/dL (ref 8.6–10.4)
Chloride: 105 mmol/L (ref 98–110)
Creat: 0.67 mg/dL (ref 0.60–1.00)
Globulin: 3.1 g/dL (ref 1.9–3.7)
Glucose, Bld: 99 mg/dL (ref 65–99)
Potassium: 4.5 mmol/L (ref 3.5–5.3)
Sodium: 139 mmol/L (ref 135–146)
Total Bilirubin: 0.3 mg/dL (ref 0.2–1.2)
Total Protein: 6.7 g/dL (ref 6.1–8.1)
eGFR: 91 mL/min/{1.73_m2} (ref >=60)

## 2023-12-17 LAB — CBC WITH DIFFERENTIAL/PLATELET
Absolute Lymphocytes: 2299 {cells}/uL (ref 850–3900)
Absolute Monocytes: 963 {cells}/uL — ABNORMAL HIGH (ref 200–950)
Basophils Absolute: 58 {cells}/uL (ref 0–200)
Basophils Relative: 0.7 %
Eosinophils Absolute: 191 {cells}/uL (ref 15–500)
Eosinophils Relative: 2.3 %
HCT: 36.6 % (ref 35.0–45.0)
Hemoglobin: 11 g/dL — ABNORMAL LOW (ref 11.7–15.5)
MCH: 22.4 pg — ABNORMAL LOW (ref 27.0–33.0)
MCHC: 30.1 g/dL — ABNORMAL LOW (ref 32.0–36.0)
MCV: 74.5 fL — ABNORMAL LOW (ref 80.0–100.0)
MPV: 11.8 fL (ref 7.5–12.5)
Monocytes Relative: 11.6 %
Neutro Abs: 4789 {cells}/uL (ref 1500–7800)
Neutrophils Relative %: 57.7 %
Platelets: 401 10*3/uL — ABNORMAL HIGH (ref 140–400)
RBC: 4.91 10*6/uL (ref 3.80–5.10)
RDW: 16.7 % — ABNORMAL HIGH (ref 11.0–15.0)
Total Lymphocyte: 27.7 %
WBC: 8.3 10*3/uL (ref 3.8–10.8)

## 2023-12-17 LAB — IRON,TIBC AND FERRITIN PANEL
%SAT: 5 % — ABNORMAL LOW (ref 16–45)
Ferritin: 10 ng/mL — ABNORMAL LOW (ref 16–288)
Iron: 21 ug/dL — ABNORMAL LOW (ref 45–160)
TIBC: 396 ug/dL (ref 250–450)

## 2023-12-17 LAB — LIPID PANEL
Cholesterol: 169 mg/dL (ref <200)
HDL: 72 mg/dL (ref >=50)
LDL Cholesterol (Calc): 82 mg/dL
Non-HDL Cholesterol (Calc): 97 mg/dL (ref <130)
Total CHOL/HDL Ratio: 2.3 (calc) (ref <5.0)
Triglycerides: 73 mg/dL (ref <150)

## 2023-12-17 NOTE — Progress Notes (Signed)
 Iron studies show low iron.  Please forward results to patient's hematologist.  Please notify patient.  Hemoglobin is low and stable.  CMP is normal.  Lipid panel is normal.

## 2023-12-19 ENCOUNTER — Encounter: Payer: Self-pay | Admitting: Family Medicine

## 2023-12-19 ENCOUNTER — Other Ambulatory Visit: Payer: Self-pay | Admitting: Rheumatology

## 2023-12-19 DIAGNOSIS — E611 Iron deficiency: Secondary | ICD-10-CM

## 2023-12-20 MED ORDER — ENBREL SURECLICK 50 MG/ML ~~LOC~~ SOAJ: 50.0000 mg | SUBCUTANEOUS | 0 refills | Status: DC

## 2023-12-20 NOTE — Telephone Encounter (Signed)
 Last Fill: 09/24/2023  Labs: 12/16/2023 Hemoglobin is low and stable.  CMP is normal.  Lipid panel is normal.   TB Gold: 10/18/2023 Neg    Next Visit: 03/22/2024  Last Visit: 12/16/2023  [id number] rheumatoid arthritis of multiple sites   Current Dose per office note 12/16/2023: Enbrel SureClick 50 mg sq injections every 7 days.   Okay to refill Enbrel?

## 2023-12-27 ENCOUNTER — Inpatient Hospital Stay: Attending: Oncology | Admitting: Oncology

## 2023-12-27 ENCOUNTER — Encounter: Payer: Self-pay | Admitting: Oncology

## 2023-12-27 ENCOUNTER — Inpatient Hospital Stay

## 2023-12-27 VITALS — BP 158/96 | HR 61 | Temp 98.3°F | Resp 16 | Ht 64.0 in | Wt 179.0 lb

## 2023-12-27 DIAGNOSIS — D509 Iron deficiency anemia, unspecified: Secondary | ICD-10-CM

## 2023-12-27 DIAGNOSIS — I10 Essential (primary) hypertension: Secondary | ICD-10-CM | POA: Insufficient documentation

## 2023-12-27 DIAGNOSIS — C50412 Malignant neoplasm of upper-outer quadrant of left female breast: Secondary | ICD-10-CM | POA: Diagnosis not present

## 2023-12-27 DIAGNOSIS — Z171 Estrogen receptor negative status [ER-]: Secondary | ICD-10-CM | POA: Diagnosis not present

## 2023-12-27 DIAGNOSIS — Z87891 Personal history of nicotine dependence: Secondary | ICD-10-CM | POA: Insufficient documentation

## 2023-12-27 DIAGNOSIS — E039 Hypothyroidism, unspecified: Secondary | ICD-10-CM | POA: Insufficient documentation

## 2023-12-27 NOTE — Progress Notes (Signed)
 [facility]  Telephone:(336) [phone] Fax:(336) [phone]  ID: Samantha Valentine OB: 03/06/1946  MR#: [id number]  MWN#:[id number]  Patient Care Team: Judy Pimple, MD as PCP - [facility], Audrie Gallus, MD as Consulting Physician (Family Medicine)  CHIEF COMPLAINT: Iron deficiency anemia.  INTERVAL HISTORY: Patient is a 78 year old female with a history of breast cancer who is referred to clinic after routine blood work revealed slowly declining hemoglobin and iron stores.  Patient admits to fatigue, but otherwise feels well.  She has no neurologic complaints.  She denies any recent fevers or illnesses.  She has a good appetite and denies weight loss.  She has no chest pain, shortness of breath, cough, or hemoptysis.  She denies any nausea, vomiting, constipation, or diarrhea.  She has no urinary complaints.  Patient offers no further specific complaints today.  REVIEW OF SYSTEMS:   Review of Systems  Constitutional:  Positive for malaise/fatigue. Negative for fever and weight loss.  Respiratory: Negative.  Negative for cough, hemoptysis and shortness of breath.   Cardiovascular: Negative.  Negative for chest pain and leg swelling.  Gastrointestinal: Negative.  Negative for abdominal pain, blood in stool and melena.  Genitourinary: Negative.  Negative for frequency.  Musculoskeletal: Negative.  Negative for back pain.  Skin: Negative.  Negative for rash.  Neurological: Negative.  Negative for dizziness, focal weakness, weakness and headaches.  Psychiatric/Behavioral: Negative.  The patient is not nervous/anxious.     As per HPI. Otherwise, a complete review of systems is negative.  PAST MEDICAL HISTORY: Past Medical History:  Diagnosis Date   Anxiety    Arthritis    RA   Breast cancer (HCC) 01/29/2017   Breast mass 1 year    Cancer (HCC) 01/29/2017   left breast INVASIVE MAMMARY CARCINOMA, ER/PR positive   Cataract 2019   resolved with surgery   Collagen  vascular disease (HCC)    Rhematoid Arthritis   Complication of anesthesia    DDD (degenerative disc disease)    in neck   Depression    Dyspnea    Edema    FEET/LEGS   Emphysema of lung (HCC)    Fibromyalgia    GERD (gastroesophageal reflux disease)    NO MEDS   History of hiatal hernia    History of kidney stones    MULTIPLE KIDNEY STONES BIL   HOH (hard of hearing)    AIDS   Hypothyroidism    Macular degeneration, bilateral    Migraine    Osteoporosis    Palpitations    Personal history of chemotherapy    prior to mastectomy   PONV (postoperative nausea and vomiting)    AFTER FIRST CATARACT   Rheumatoid arthritis (HCC)     PAST SURGICAL HISTORY: Past Surgical History:  Procedure Laterality Date   BREAST BIOPSY Left 01/29/2017   US guided biopsy INVASIVE MAMMARY CARCINOMA   CATARACT EXTRACTION W/PHACO Left 04/28/2018   Procedure: CATARACT EXTRACTION PHACO AND INTRAOCULAR LENS PLACEMENT (IOC);  Surgeon: Lockie Mola, MD;  Location: [facility];  Service: Ophthalmology;  Laterality: Left;  Lot # [id number] H [place]   1:03 AP%   13.4 CDE    8.40   CATARACT EXTRACTION W/PHACO Right 06/09/2018   Procedure: CATARACT EXTRACTION PHACO AND INTRAOCULAR LENS PLACEMENT (IOC);  Surgeon: Lockie Mola, MD;  Location: [facility];  Service: Ophthalmology;  Laterality: Right;  lot# [id number] [place] 0:55 ap 16.3% cde 8.21   HIP ARTHROPLASTY     JOINT REPLACEMENT Right 2003  total hip replacement   LITHOTRIPSY  1997   kidney stone   MASTECTOMY Left 01/29/2017   MASTECTOMY W/ SENTINEL NODE BIOPSY Left 05/18/2017   Procedure: MASTECTOMY WITH SENTINEL LYMPH NODE BIOPSY;  Surgeon: Earline Mayotte, MD;  Location: [facility];  Service: General;  Laterality: Left;   PORT-A-CATH REMOVAL  09/2017   PORTACATH PLACEMENT Right 02/15/2017   Procedure: INSERTION PORT-A-CATH;  Surgeon: Earline Mayotte, MD;  Location: [facility];  Service: General;  Laterality: Right;   TONSILLECTOMY  1970     FAMILY HISTORY: Family History  Problem Relation Age of Onset   Hypertension Mother    Endometrial cancer Mother    Osteoarthritis Mother    Hearing loss Mother    Alcohol abuse Father    Diabetes Father    Stroke Father    Liver disease Sister    Breast cancer Maternal Aunt    Breast cancer Paternal Aunt 6   Rheum arthritis Maternal Grandmother    Arthritis Maternal Grandmother    Diabetes Paternal Grandmother    Parkinson's disease Paternal Grandfather     ADVANCED DIRECTIVES (Y/N):  N  HEALTH MAINTENANCE: Social History   Tobacco Use   Smoking status: Former    Current packs/day: 0.00    Average packs/day: 1.5 packs/day for 25.0 years (37.5 ttl pk-yrs)    Types: Cigarettes    Start date: 10/19/1958    Quit date: 10/20/1983    Years since quitting: 40.2    Passive exposure: Past   Smokeless tobacco: Never  Vaping Use   Vaping status: Never Used  Substance Use Topics   Alcohol use: Not Currently   Drug use: Never     Colonoscopy:  PAP:  Bone density:  Lipid panel:  Allergies  Allergen Reactions   Adhesive [Tape] Other (See Comments)    Skin blistered (PAPER TAPE OK)   Cymbalta [Duloxetine Hcl]     Headache Inc bp   Hydroxychloroquine Sulfate Rash   Ibandronate Sodium Rash   Risedronate Sodium Rash    Current Outpatient Medications  Medication Sig Dispense Refill   aspirin EC 81 MG tablet Take 81 mg by mouth daily.     celecoxib (CELEBREX) 200 MG capsule TAKE 1 CAPSULE BY MOUTH TWICE DAILY WITH FOOD AS NEEDED 120 capsule 0   cholecalciferol (VITAMIN D) 1000 units tablet Take 1,000 Units by mouth daily.     etanercept (ENBREL SURECLICK) 50 MG/ML injection Inject 50 mg into the skin once a week. 12 mL 0   gabapentin (NEURONTIN) 100 MG capsule TAKE 1 CAPSULE BY MOUTH THREE TIMES DAILY 270 capsule 1   levothyroxine (SYNTHROID) 125 MCG tablet TAKE 1 TABLET BY MOUTH ONCE DAILY BEFORE BREAKFAST 90 tablet 2   LORazepam (ATIVAN) 1 MG tablet TAKE 1 TABLET BY  MOUTH AT BEDTIME AS NEEDED FOR ANXIETY 30 tablet 1   predniSONE (DELTASONE) 5 MG tablet Take 4 tabs po x 4 days, 3  tabs po x 4 days, 2  tabs po x 4 days, 1  tab po x 4 days 40 tablet 0   cyanocobalamin (VITAMIN B12) 1000 MCG tablet Take 1,000 mcg by mouth daily. (Patient not taking: Reported on 10/18/2023)     No current facility-administered medications for this visit.    OBJECTIVE: Vitals:   12/27/23 1109  BP: (!) 158/96  Pulse: 61  Resp: 16  Temp: 98.3 F (36.8 C)  SpO2: 100%     Body mass index is 30.73 kg/m.    ECOG  FS:0 - Asymptomatic  General: Well-developed, well-nourished, no acute distress. Eyes: Pink conjunctiva, anicteric sclera. HEENT: Normocephalic, moist mucous membranes. Lungs: No audible wheezing or coughing. Heart: Regular rate and rhythm. Abdomen: Soft, nontender, no obvious distention. Musculoskeletal: No edema, cyanosis, or clubbing. Neuro: Alert, answering all questions appropriately. Cranial nerves grossly intact. Skin: No rashes or petechiae noted. Psych: Normal affect. Lymphatics: No cervical, calvicular, axillary or inguinal LAD.   LAB RESULTS:  Lab Results  Component Value Date   NA 139 12/16/2023   K 4.5 12/16/2023   CL 105 12/16/2023   CO2 26 12/16/2023   GLUCOSE 99 12/16/2023   BUN 17 12/16/2023   CREATININE 0.67 12/16/2023   CALCIUM 9.4 12/16/2023   PROT 6.7 12/16/2023   ALBUMIN 3.8 09/09/2023   AST 12 12/16/2023   ALT 9 12/16/2023   ALKPHOS 75 09/09/2023   BILITOT 0.3 12/16/2023   GFRNONAA 87 11/06/2020   GFRAA 101 11/06/2020    Lab Results  Component Value Date   WBC 8.3 12/16/2023   NEUTROABS 4,789 12/16/2023   HGB 11.0 (L) 12/16/2023   HCT 36.6 12/16/2023   MCV 74.5 (L) 12/16/2023   PLT 401 (H) 12/16/2023   Lab Results  Component Value Date   IRON 21 (L) 12/16/2023   TIBC 396 12/16/2023   IRONPCTSAT 5 (L) 12/16/2023   Lab Results  Component Value Date   FERRITIN 10 (L) 12/16/2023     STUDIES: No results  found.  ASSESSMENT: Iron deficiency anemia.  PLAN:    Iron deficiency anemia: Patient noted to have a mildly decreased hemoglobin with significantly decreased iron panel on recent blood work.  Unclear when her last luminal evaluation was.  Patient has been instructed to continue oral iron supplementation.  She will return to clinic 5 times over the next 2 to 3 weeks to receive 200 mg IV Venofer.  Patient will then return to clinic in 4 months with repeat laboratory work, further evaluation, and continuation of treatment if needed. History of pathologic stage IA ER/PR positive, HER-2 negative invasive carcinoma of the upper-outer quadrant of the left breast: Patient completed only 3 cycles of Adriamycin and Cytoxan on March 24, 2017 then secondary to toxicity, declined any further neoadjuvant chemotherapy.  She underwent simple mastectomy on May 18, 2017 therefore did not require adjuvant XRT.  Adjuvant aromatase inhibitors were recommended, but patient declined.  She previously expressed understanding but not completing the recommended treatments increased her risk of recurrence.  She has subsequently lost to follow-up.  No further intervention is needed.  Continue right breast screening mammograms as per primary care.   Hypertension: Patient's blood pressure is moderately elevated today.  Continue monitoring and treatment per primary care.  I spent a total of 45 minutes reviewing chart data, face-to-face evaluation with the patient, counseling and coordination of care as detailed above.   Patient expressed understanding and was in agreement with this plan. She also understands that She can call clinic at any time with any questions, concerns, or complaints.    Cancer Staging  Malignant neoplasm of upper-outer quadrant of left breast in female, estrogen receptor negative (HCC) Staging form: Breast, AJCC 8th Edition - Clinical stage from 02/04/2017: Stage IB (cT2, cN0, cM0, G2, ER+, PR+, HER2-) -  Signed by Jeralyn Ruths, MD on 02/15/2017 Histologic grading system: 3 grade system - Pathologic stage from 05/29/2017: Stage IA (pT2, pN0, cM0, G2, ER+, PR+, HER2-) - Signed by Jeralyn Ruths, MD on 05/29/2017 Neoadjuvant therapy: No Histologic  grading system: 3 grade system Laterality: Left   Jeralyn Ruths, MD   12/27/2023 4:30 PM

## 2023-12-28 ENCOUNTER — Inpatient Hospital Stay

## 2023-12-28 VITALS — BP 135/66 | HR 90 | Temp 99.2°F | Resp 16

## 2023-12-28 DIAGNOSIS — D509 Iron deficiency anemia, unspecified: Secondary | ICD-10-CM | POA: Diagnosis not present

## 2023-12-28 MED ORDER — SODIUM CHLORIDE 0.9% FLUSH
10.0000 mL | Freq: Once | INTRAVENOUS | Status: AC | PRN
  Administered 2023-12-28: 10 mL
  Filled 2023-12-28: qty 10

## 2023-12-28 MED ORDER — IRON SUCROSE 20 MG/ML IV SOLN
200.0000 mg | Freq: Once | INTRAVENOUS | Status: AC
  Administered 2023-12-28: 200 mg via INTRAVENOUS
  Filled 2023-12-28: qty 10

## 2023-12-28 NOTE — Progress Notes (Signed)
 1320: B/P 160/105. HR ranging between 90 and 146. Pt denies history of Afib, pt denies any concerns at this time and states this is WNL, that she feels anxious and then exertion triggers SOB and tachycardia. Per Dr. Orlie Dakin okay to treat and continue to monitor.   1409: Pt tolerated Venofer IVP well. Pt stable. VS stable.

## 2023-12-31 ENCOUNTER — Inpatient Hospital Stay

## 2023-12-31 VITALS — BP 138/76 | HR 86 | Temp 97.1°F | Resp 16

## 2023-12-31 DIAGNOSIS — D509 Iron deficiency anemia, unspecified: Secondary | ICD-10-CM

## 2023-12-31 MED ORDER — IRON SUCROSE 20 MG/ML IV SOLN
200.0000 mg | Freq: Once | INTRAVENOUS | Status: AC
  Administered 2023-12-31: 200 mg via INTRAVENOUS
  Filled 2023-12-31: qty 10

## 2023-12-31 NOTE — Progress Notes (Signed)
 Pt declined 30 minute post obs. Stable at discharge

## 2024-01-04 ENCOUNTER — Inpatient Hospital Stay

## 2024-01-04 VITALS — BP 124/67 | HR 100 | Temp 98.2°F | Resp 18

## 2024-01-04 DIAGNOSIS — D509 Iron deficiency anemia, unspecified: Secondary | ICD-10-CM

## 2024-01-04 MED ORDER — IRON SUCROSE 20 MG/ML IV SOLN
200.0000 mg | Freq: Once | INTRAVENOUS | Status: AC
  Administered 2024-01-04: 200 mg via INTRAVENOUS
  Filled 2024-01-04: qty 10

## 2024-01-04 NOTE — Progress Notes (Signed)
 Pt tolerated treatment without concerns.  VSS.  Pt refused 30 minute post observation.  Pt understands risks.

## 2024-01-07 ENCOUNTER — Inpatient Hospital Stay

## 2024-01-07 VITALS — BP 154/78 | HR 78 | Temp 96.0°F | Resp 18

## 2024-01-07 DIAGNOSIS — D509 Iron deficiency anemia, unspecified: Secondary | ICD-10-CM

## 2024-01-07 MED ORDER — SODIUM CHLORIDE 0.9% FLUSH
10.0000 mL | Freq: Once | INTRAVENOUS | Status: AC | PRN
  Administered 2024-01-07: 10 mL
  Filled 2024-01-07: qty 10

## 2024-01-07 MED ORDER — IRON SUCROSE 20 MG/ML IV SOLN
200.0000 mg | Freq: Once | INTRAVENOUS | Status: AC
  Administered 2024-01-07: 200 mg via INTRAVENOUS

## 2024-01-11 ENCOUNTER — Inpatient Hospital Stay

## 2024-01-25 ENCOUNTER — Encounter: Payer: Self-pay | Admitting: Family Medicine

## 2024-01-25 ENCOUNTER — Ambulatory Visit (INDEPENDENT_AMBULATORY_CARE_PROVIDER_SITE_OTHER): Admitting: Family Medicine

## 2024-01-25 ENCOUNTER — Ambulatory Visit (INDEPENDENT_AMBULATORY_CARE_PROVIDER_SITE_OTHER)
Admission: RE | Admit: 2024-01-25 | Discharge: 2024-01-25 | Disposition: A | Discharge status: Home / Self Care | Source: Ambulatory Visit | Attending: Family Medicine | Admitting: Family Medicine

## 2024-01-25 VITALS — BP 129/70 | HR 98 | Temp 97.8°F | Ht 64.0 in | Wt 174.2 lb

## 2024-01-25 DIAGNOSIS — M51369 Other intervertebral disc degeneration, lumbar region without mention of lumbar back pain or lower extremity pain: Secondary | ICD-10-CM

## 2024-01-25 DIAGNOSIS — M79606 Pain in leg, unspecified: Secondary | ICD-10-CM | POA: Diagnosis not present

## 2024-01-25 DIAGNOSIS — M545 Low back pain, unspecified: Secondary | ICD-10-CM | POA: Diagnosis not present

## 2024-01-25 DIAGNOSIS — M533 Sacrococcygeal disorders, not elsewhere classified: Secondary | ICD-10-CM

## 2024-01-25 DIAGNOSIS — Z96641 Presence of right artificial hip joint: Secondary | ICD-10-CM | POA: Diagnosis not present

## 2024-01-25 DIAGNOSIS — Z471 Aftercare following joint replacement surgery: Secondary | ICD-10-CM | POA: Diagnosis not present

## 2024-01-25 DIAGNOSIS — M5136 Other intervertebral disc degeneration, lumbar region with discogenic back pain only: Secondary | ICD-10-CM | POA: Diagnosis not present

## 2024-01-25 DIAGNOSIS — M1612 Unilateral primary osteoarthritis, left hip: Secondary | ICD-10-CM | POA: Diagnosis not present

## 2024-01-25 LAB — POC URINALSYSI DIPSTICK (AUTOMATED)
Bilirubin, UA: NEGATIVE
Blood, UA: NEGATIVE
Glucose, UA: NEGATIVE
Ketones, UA: NEGATIVE
Leukocytes, UA: NEGATIVE
Nitrite, UA: NEGATIVE
Protein, UA: NEGATIVE
Spec Grav, UA: <=1.005 (ref 1.010–1.025)
Urobilinogen, UA: 0.2 U/dL
pH, UA: 6 (ref 5.0–8.0)

## 2024-01-25 MED ORDER — GABAPENTIN 300 MG PO CAPS: 300.0000 mg | ORAL_CAPSULE | Freq: Three times a day (TID) | ORAL | 3 refills | Status: DC

## 2024-01-25 NOTE — Assessment & Plan Note (Addendum)
 Suspect adding to recently worsened back pain  Reviewed last rheum notes   Xray ordered -noting some degenerative discussed changes at L1-2 and L4-5  Also some changes of possible L4 old fracture  Continues celebrex/ ice and heat with stretching  Will try increase in gabapentin to 300 mg tid - watching for side effects   SI joint is also bothersome

## 2024-01-25 NOTE — Patient Instructions (Addendum)
 Keep doing what you are doing -stretching and ice and heat  Gabapentin , go up to 300 mg three times daily as tolerated   Use caution of sedation with gabapentin   Xray today  SI and LS  May consider sport med

## 2024-01-25 NOTE — Progress Notes (Signed)
 Subjective:    Patient ID: Samantha Valentine, female    DOB: 1946-02-14, 78 y.o.   MRN: [id number]  HPI  Wt Readings from Last 3 Encounters:  01/25/24 174 lb 4 oz (79 kg)  12/27/23 179 lb (81.2 kg)  12/16/23 179 lb (81.2 kg)   29.91 kg/m  Vitals:   01/25/24 1025 01/25/24 1058  BP: (!) 130/90 129/70  Pulse: 98   Temp: 97.8 F (36.6 C)   SpO2: 97%     Pt presents with c/o lower back pain   History of DDD lumbar  Gabapentin 100 mg tid prn  Celebrex 200 mg Enbrel   Also has RA and myofascial pain disorder   CT of pelvis in 2018 noted some discussed bulges likely   Now having more pain in right SI joint area  Sometimes migrates to mid spine  Right quadriceps area is very sore  It has never been this bad -since 3/21   Trying to do some stretches- helped until about Saturday   Has used heat and ice   Worst to move/walk for more than a few minutes Can sit for 5-30 minutes before lying down   Lying down is helpful-on left side with legs flexed   Has 51 y old hip replacement on that side     Gabapentin  Before this started -took 3 (100) pills at night   Now took 200 in the am and then 200     Getting iron infusions   Urinalysis is clear  Results for orders placed or performed in visit on 01/25/24  POCT Urinalysis Dipstick (Automated)   Collection Time: 01/25/24 10:39 AM  Result Value Ref Range   Color, UA Light Yellow    Clarity, UA Clear    Glucose, UA Negative Negative   Bilirubin, UA Negative    Ketones, UA Negative    Spec Grav, UA <=1.005 1.010 - 1.025   Blood, UA Negative    pH, UA 6.0 5.0 - 8.0   Protein, UA Negative Negative   Urobilinogen, UA 0.2 0.2 or 1.0 E.U./dL   Nitrite, UA Negative    Leukocytes, UA Negative Negative    Imaging today  DG Pelvis 1-2 Views Result Date: 01/25/2024 CLINICAL DATA:  Upper leg pain. EXAM: PELVIS - 1-2 VIEW COMPARISON:  November 20, 2008. FINDINGS: Status post right total hip arthroplasty. No acute  fracture or dislocation is noted. Mild degenerative changes seen involving the left hip. IMPRESSION: No acute abnormality seen. Electronically Signed   By: Lupita Raider M.D.   On: 01/25/2024 12:10   DG Lumbar Spine 2-3 Views Result Date: 01/25/2024 CLINICAL DATA:  Lower back pain. EXAM: LUMBAR SPINE - 2-3 VIEW COMPARISON:  February 12, 2017. FINDINGS: Moderate superior endplate depression of L4 vertebral body is noted which is most consistent with old fracture. Mild degenerative disc disease is noted at L1-2. Moderate degenerative disc disease is noted at L4-5. No spondylolisthesis. IMPRESSION: Moderate superior endplate depression of L4 vertebral body most consistent with old fracture, but if patient is symptomatic in this area, MRI is recommended to rule out acute fracture. Electronically Signed   By: Lupita Raider M.D.   On: 01/25/2024 12:08       Patient Active Problem List   Diagnosis Date Noted   Sacroiliac pain 01/25/2024   Iron deficiency anemia 12/27/2023   Swelling of eyelid 06/29/2023   Anemia 03/29/2023   Fatigue 03/29/2023   Pedal edema 03/29/2023   Elevated blood pressure  reading 03/29/2023   Shortness of breath 03/29/2023   Vitamin B12 deficiency 03/29/2023   Low serum iron 03/29/2023   Insomnia 12/15/2022   History of breast cancer 12/23/2021   Elevated glucose level 11/23/2018   Routine general medical examination at a health care facility 08/09/2018   DDD (degenerative disc disease), thoracolumbar 06/28/2018   DDD (degenerative disc disease), cervical 06/28/2018   Atherosclerosis of aorta (HCC) 03/11/2018   Sleep disturbance 06/18/2017   Tachycardia 05/25/2017   Estrogen deficiency 03/22/2017   Malignant neoplasm of upper-outer quadrant of left breast in female, estrogen receptor negative (HCC) 02/04/2017   Tendinopathy of right shoulder 01/25/2017   High risk medication use 09/29/2016   DJD (degenerative joint disease), cervical 09/29/2016   History of right hip  replacement 09/29/2016   Eczema 07/13/2014   Adverse effect of immunosuppressive drug 04/27/2012   ANXIETY DEPRESSION 10/28/2008   Spinal stenosis 12/29/2007   Hypothyroidism 12/28/2007   Vitamin D deficiency 12/28/2007   HEARING LOSS 12/28/2007   Seropositive rheumatoid arthritis of multiple sites (HCC) 12/28/2007   DDD (degenerative disc disease), lumbar 12/28/2007   Fibromyalgia 12/28/2007   Osteoporosis 12/28/2007   MIGRAINES, HX OF 12/28/2007   Past Medical History:  Diagnosis Date   Anxiety    Arthritis    RA   Breast cancer (HCC) 01/29/2017   Breast mass 1 year    Cancer (HCC) 01/29/2017   left breast INVASIVE MAMMARY CARCINOMA, ER/PR positive   Cataract 2019   resolved with surgery   Collagen vascular disease (HCC)    Rhematoid Arthritis   Complication of anesthesia    DDD (degenerative disc disease)    in neck   Depression    Dyspnea    Edema    FEET/LEGS   Emphysema of lung (HCC)    Fibromyalgia    GERD (gastroesophageal reflux disease)    NO MEDS   History of hiatal hernia    History of kidney stones    MULTIPLE KIDNEY STONES BIL   HOH (hard of hearing)    AIDS   Hypothyroidism    Macular degeneration, bilateral    Migraine    Osteoporosis    Palpitations    Personal history of chemotherapy    prior to mastectomy   PONV (postoperative nausea and vomiting)    AFTER FIRST CATARACT   Rheumatoid arthritis (HCC)    Past Surgical History:  Procedure Laterality Date   BREAST BIOPSY Left 01/29/2017   US guided biopsy INVASIVE MAMMARY CARCINOMA   CATARACT EXTRACTION W/PHACO Left 04/28/2018   Procedure: CATARACT EXTRACTION PHACO AND INTRAOCULAR LENS PLACEMENT (IOC);  Surgeon: Lockie Mola, MD;  Location: [facility];  Service: Ophthalmology;  Laterality: Left;  Lot # [id number]   1:03 AP%   13.4 CDE    8.40   CATARACT EXTRACTION W/PHACO Right 06/09/2018   Procedure: CATARACT EXTRACTION PHACO AND INTRAOCULAR LENS PLACEMENT (IOC);  Surgeon:  Lockie Mola, MD;  Location: [facility];  Service: Ophthalmology;  Laterality: Right;  lot# [id number] h [place] 0:55 ap 16.3% cde 8.21   HIP ARTHROPLASTY     JOINT REPLACEMENT Right 2003   total hip replacement   LITHOTRIPSY  1997   kidney stone   MASTECTOMY Left 01/29/2017   MASTECTOMY W/ SENTINEL NODE BIOPSY Left 05/18/2017   Procedure: MASTECTOMY WITH SENTINEL LYMPH NODE BIOPSY;  Surgeon: Earline Mayotte, MD;  Location: [facility];  Service: General;  Laterality: Left;   PORT-A-CATH REMOVAL  09/2017   PORTACATH PLACEMENT Right 02/15/2017  Procedure: INSERTION PORT-A-CATH;  Surgeon: Earline Mayotte, MD;  Location: [facility];  Service: General;  Laterality: Right;   TONSILLECTOMY  1970   Social History   Tobacco Use   Smoking status: Former    Current packs/day: 0.00    Average packs/day: 1.5 packs/day for 25.0 years (37.5 ttl pk-yrs)    Types: Cigarettes    Start date: 10/19/1958    Quit date: 10/20/1983    Years since quitting: 40.2    Passive exposure: Past   Smokeless tobacco: Never  Vaping Use   Vaping status: Never Used  Substance Use Topics   Alcohol use: Not Currently   Drug use: Never   Family History  Problem Relation Age of Onset   Hypertension Mother    Endometrial cancer Mother    Osteoarthritis Mother    Hearing loss Mother    Alcohol abuse Father    Diabetes Father    Stroke Father    Liver disease Sister    Breast cancer Maternal Aunt    Breast cancer Paternal Aunt 76   Rheum arthritis Maternal Grandmother    Arthritis Maternal Grandmother    Diabetes Paternal Grandmother    Parkinson's disease Paternal Grandfather    Allergies  Allergen Reactions   Adhesive [Tape] Other (See Comments)    Skin blistered (PAPER TAPE OK)   Cymbalta [Duloxetine Hcl]     Headache Inc bp   Hydroxychloroquine Sulfate Rash   Ibandronate Sodium Rash   Risedronate Sodium Rash   Current Outpatient Medications on File Prior to Visit  Medication Sig Dispense Refill    aspirin EC 81 MG tablet Take 81 mg by mouth daily.     celecoxib (CELEBREX) 200 MG capsule TAKE 1 CAPSULE BY MOUTH TWICE DAILY WITH FOOD AS NEEDED 120 capsule 0   cholecalciferol (VITAMIN D) 1000 units tablet Take 1,000 Units by mouth daily.     cyanocobalamin (VITAMIN B12) 1000 MCG tablet Take 1,000 mcg by mouth daily.     etanercept (ENBREL SURECLICK) 50 MG/ML injection Inject 50 mg into the skin once a week. 12 mL 0   gabapentin (NEURONTIN) 100 MG capsule TAKE 1 CAPSULE BY MOUTH THREE TIMES DAILY (Patient taking differently: 100 mg 3 (three) times daily as needed. TAKE 1 CAPSULE BY MOUTH THREE TIMES DAILY) 270 capsule 1   levothyroxine (SYNTHROID) 125 MCG tablet TAKE 1 TABLET BY MOUTH ONCE DAILY BEFORE BREAKFAST 90 tablet 2   LORazepam (ATIVAN) 1 MG tablet TAKE 1 TABLET BY MOUTH AT BEDTIME AS NEEDED FOR ANXIETY 30 tablet 1   No current facility-administered medications on file prior to visit.    Review of Systems  Constitutional:  Negative for activity change, appetite change, fatigue, fever and unexpected weight change.  HENT:  Negative for congestion, ear pain, rhinorrhea, sinus pressure and sore throat.   Eyes:  Negative for pain, redness and visual disturbance.  Respiratory:  Negative for cough, shortness of breath and wheezing.   Cardiovascular:  Negative for chest pain and palpitations.  Gastrointestinal:  Negative for abdominal pain, blood in stool, constipation and diarrhea.  Endocrine: Negative for polydipsia and polyuria.  Genitourinary:  Negative for dysuria, frequency and urgency.  Musculoskeletal:  Positive for arthralgias and back pain. Negative for myalgias.  Skin:  Negative for pallor and rash.  Allergic/Immunologic: Negative for environmental allergies.  Neurological:  Negative for dizziness, syncope and headaches.  Hematological:  Negative for adenopathy. Does not bruise/bleed easily.  Psychiatric/Behavioral:  Negative for decreased concentration and dysphoric mood.  The  patient is not nervous/anxious.        Objective:   Physical Exam Constitutional:      General: She is not in acute distress.    Appearance: She is well-developed.  HENT:     Head: Normocephalic and atraumatic.  Eyes:     Conjunctiva/sclera: Conjunctivae normal.     Pupils: Pupils are equal, round, and reactive to light.  Neck:     Thyroid: No thyromegaly.     Vascular: No carotid bruit or JVD.  Cardiovascular:     Rate and Rhythm: Normal rate and regular rhythm.     Heart sounds: Normal heart sounds.     No gallop.  Pulmonary:     Effort: Pulmonary effort is normal. No respiratory distress.     Breath sounds: Normal breath sounds. No wheezing or rales.  Abdominal:     General: There is no distension or abdominal bruit.     Palpations: Abdomen is soft.  Musculoskeletal:     Cervical back: Normal range of motion and neck supple.     Lumbar back: Tenderness and bony tenderness present. No swelling. Positive right straight leg raise test. Negative left straight leg raise test. Scoliosis present.     Right lower leg: No edema.     Left lower leg: No edema.     Comments: Some thoracolumbar scoliosis  Left shoulder is higher  Some tenderness over right SI area  No bony spine tenderness Worse with extension  Improved with flexion  Normal rom of hips  SLR causes back and buttock pain on left and upper leg pain on right   Lymphadenopathy:     Cervical: No cervical adenopathy.  Skin:    General: Skin is warm and dry.     Coloration: Skin is not pale.     Findings: No rash.  Neurological:     Mental Status: She is alert.     Sensory: No sensory deficit.     Motor: No weakness.     Coordination: Coordination normal.     Deep Tendon Reflexes: Reflexes are normal and symmetric. Reflexes normal.  Psychiatric:        Mood and Affect: Mood normal.           Assessment & Plan:   Problem List Items Addressed This Visit       Musculoskeletal and Integument   DDD  (degenerative disc disease), lumbar   Suspect adding to recently worsened back pain  Reviewed last rheum notes   Xray ordered -noting some degenerative discussed changes at L1-2 and L4-5  Also some changes of possible L4 old fracture  Continues celebrex/ ice and heat with stretching  Will try increase in gabapentin to 300 mg tid - watching for side effects   SI joint is also bothersome      Relevant Orders   DG Lumbar Spine 2-3 Views (Completed)   DG Pelvis 1-2 Views (Completed)     Other   Sacroiliac pain - Primary   In setting of lumbar DDD and also RA   Some tenderness on exam Some improvement with flexion of spine  Reassuring rom hip   Xray today : pelvic xray notes intact right hip arth and left hip mild deg changes / no SI joint changes   May benefit from sport med opinion and injection or PT if appropriate      Relevant Orders   DG Lumbar Spine 2-3 Views (Completed)   DG Pelvis 1-2 Views (Completed)  Other Visit Diagnoses       Low back pain, unspecified back pain laterality, unspecified chronicity, unspecified whether sciatica present       Relevant Orders   POCT Urinalysis Dipstick (Automated) (Completed)   DG Lumbar Spine 2-3 Views (Completed)   DG Pelvis 1-2 Views (Completed)

## 2024-01-25 NOTE — Assessment & Plan Note (Addendum)
 In setting of lumbar DDD and also RA   Some tenderness on exam Some improvement with flexion of spine  Reassuring rom hip   Xray today : pelvic xray notes intact right hip arth and left hip mild deg changes / no SI joint changes   May benefit from sport med opinion and injection or PT if appropriate

## 2024-02-02 ENCOUNTER — Encounter: Payer: Self-pay | Admitting: Family Medicine

## 2024-02-02 NOTE — Progress Notes (Signed)
 Awesome Jared T. Caterra Ostroff, MD, CAQ Sports Medicine [facility] at [facility] [address] [place], [address]  Phone: [phone]  FAX: [phone]  Samantha Valentine - 78 y.o. female  MRN [id number]  Date of Birth: 04/18/1946  Date: 02/03/2024  PCP: Clemens Curt, MD  Referral: Clemens Curt, MD  Chief Complaint  Patient presents with   SI Joint Pain    Here with friend, Dana Duncan. Dr Malissa Se saw pt recently and did x-rays. Right side is the issue.    Subjective:   Samantha Valentine is a 78 y.o. very pleasant female patient with Body mass index is 29.52 kg/m. who presents with the following:  He has a very pleasant lady who last saw about 10 years ago, and she presents today with some ongoing SI joint pain.  Of note, the patient does have rheumatoid arthritis and the chart does have some notes about chronic cervical, thoracic, and chronic lumbar pain.  There is also a note about fibromyalgia and chronic myofascial pain syndrome.  She does have rheumatoid arthritis Enbrel  for 25 years Sees Dr. Alvira Josephs  3/21 - onset of severe pain She does have ongoing back pain, she has pain in the SI joints, worse on the right She also has some pain in the buttocks  Not really active, limited a lot with RA  Does have a history of left sided total hip arthroplasty Pain is a little bit better when she lies down on her left side  She is taking some gabapentin , which she thinks is helping  Review of Systems is noted in the HPI, as appropriate  Objective:   BP 138/78 (BP Location: Right Arm, Patient Position: Sitting, Cuff Size: Large)   Pulse 84   Temp 98.1 F (36.7 C) (Oral)   Ht 5\' 4"  (1.626 m)   Wt 172 lb (78 kg)   SpO2 96%   BMI 29.52 kg/m   GEN: No acute distress; alert,appropriate. PULM: Breathing comfortably in no respiratory distress PSYCH: Normally interactive.    Range of motion at  the waist: Flexion, extension, lateral bending and  rotation: Mild restriction globally, some pain with forward flexion, lateral bending and rotation maneuvers are more preserved  No echymosis or edema Rises to examination table with mild difficulty Gait: minimally antalgic  Inspection/Deformity: N Paraspinus Tenderness: L3-S1 bilaterally  B Ankle Dorsiflexion (L5,4): 5/5 B Great Toe Dorsiflexion (L5,4): 5/5 Heel Walk (L5): WNL Toe Walk (S1): WNL Rise/Squat (L4): WNL, mild pain  SENSORY B Medial Foot (L4): WNL B Dorsum (L5): WNL B Lateral (S1): WNL Light Touch: WNL  REFLEXES Knee (L4): 2+ Ankle (S1): 2+  B SLR, seated: neg B SLR, supine: neg B FABER: Back pain B Reverse FABER: Back pain B Greater Troch: NT B Log Roll: neg B Sciatic Notch: SI joint pain on the right greater than left  Laboratory and Imaging Data: CLINICAL DATA:  Lower back pain.   EXAM: LUMBAR SPINE - 2-3 VIEW   COMPARISON:  February 12, 2017.   FINDINGS: Moderate superior endplate depression of L4 vertebral body is noted which is most consistent with old fracture. Mild degenerative disc disease is noted at L1-2. Moderate degenerative disc disease is noted at L4-5. No spondylolisthesis.   IMPRESSION: Moderate superior endplate depression of L4 vertebral body most consistent with old fracture, but if patient is symptomatic in this area, MRI is recommended to rule out acute fracture.     Electronically Signed   By:  Rosalene Colon M.D.   On: 01/25/2024 12:08    Assessment and Plan:     ICD-10-CM   1. Sacroiliac pain  M53.3 triamcinolone  acetonide (KENALOG -40) injection 40 mg    Ambulatory referral to Physical Therapy    2. DDD (degenerative disc disease), thoracolumbar  M51.35 Ambulatory referral to Physical Therapy     Patient certainly has extensive degenerative changes in the back and she has intermittent chronic pain in the back.  For last month this has been more acute and focal to the SI joint on the right.  Lumbar spine films are  reviewed.  I think that the compression fracture at L4 is old, and she also has no significant pain in this area.  Will have her go ahead and start PT.  Begin basic HEP now.  The primary question here is if her SI joint pain is manage of her overall global low back and buttocks pain will improve.  I think that is a reasonable question, and will try to do a diagnostic and therapeutic SI joint injection to see if this will help relieve her symptoms.  Aspiration/Injection Procedure Note Samantha Valentine 1945-12-14 Date of procedure: 02/03/2024  Procedure: SI Joint Injection, R Indications: Pain  Procedure Details Verbal consent obtained. Risks (including potential skin lightening, and potential atrophy), benefits, and alternatives have been discussed with the patient. Patient prone. Chloraprep for prep and ethyl chloride for anesthesia.  Under sterile conditions, SI joint injected angled obliquely laterally at 45 degree angle. No resistance met and medication flows freely. Medication: 2 cc Lidocaine  1% and 1 cc of Kenalog  40 mg Needle: 22 gauge 1 1/2 inch Medication: 1 mL Kenalog  40 mg   Medication Management during today's office visit: Meds ordered this encounter  Medications   triamcinolone  acetonide (KENALOG -40) injection 40 mg   There are no discontinued medications.  Orders placed today for conditions managed today: Orders Placed This Encounter  Procedures   Ambulatory referral to Physical Therapy    Disposition: No follow-ups on file.  [employer] One speech-to-text software was used for transcription in this dictation.  Possible transcriptional errors can occur using Animal nutritionist.   Signed,  Ranny Bye. Eevie Lapp, MD   Outpatient Encounter Medications as of 02/03/2024  Medication Sig   aspirin EC 81 MG tablet Take 81 mg by mouth daily.   celecoxib  (CELEBREX ) 200 MG capsule TAKE 1 CAPSULE BY MOUTH TWICE DAILY WITH FOOD AS NEEDED   cholecalciferol (VITAMIN D ) 1000  units tablet Take 1,000 Units by mouth daily.   cyanocobalamin  (VITAMIN B12) 1000 MCG tablet Take 1,000 mcg by mouth daily.   etanercept  (ENBREL  SURECLICK) 50 MG/ML injection Inject 50 mg into the skin once a week.   gabapentin  (NEURONTIN ) 100 MG capsule TAKE 1 CAPSULE BY MOUTH THREE TIMES DAILY (Patient taking differently: 100 mg 3 (three) times daily as needed. TAKE 1 CAPSULE BY MOUTH THREE TIMES DAILY)   gabapentin  (NEURONTIN ) 300 MG capsule Take 1 capsule (300 mg total) by mouth 3 (three) times daily.   levothyroxine  (SYNTHROID ) 125 MCG tablet TAKE 1 TABLET BY MOUTH ONCE DAILY BEFORE BREAKFAST   LORazepam  (ATIVAN ) 1 MG tablet TAKE 1 TABLET BY MOUTH AT BEDTIME AS NEEDED FOR ANXIETY   [EXPIRED] triamcinolone  acetonide (KENALOG -40) injection 40 mg    No facility-administered encounter medications on file as of 02/03/2024.

## 2024-02-03 ENCOUNTER — Ambulatory Visit (INDEPENDENT_AMBULATORY_CARE_PROVIDER_SITE_OTHER): Admitting: Family Medicine

## 2024-02-03 VITALS — BP 138/78 | HR 84 | Temp 98.1°F | Ht 64.0 in | Wt 172.0 lb

## 2024-02-03 DIAGNOSIS — M5135 Other intervertebral disc degeneration, thoracolumbar region: Secondary | ICD-10-CM

## 2024-02-03 DIAGNOSIS — M533 Sacrococcygeal disorders, not elsewhere classified: Secondary | ICD-10-CM

## 2024-02-03 MED ORDER — TRIAMCINOLONE ACETONIDE 40 MG/ML IJ SUSP
40.0000 mg | Freq: Once | INTRAMUSCULAR | Status: AC
  Administered 2024-02-03: 40 mg via INTRA_ARTICULAR

## 2024-02-05 ENCOUNTER — Encounter: Payer: Self-pay | Admitting: Family Medicine

## 2024-03-08 NOTE — Progress Notes (Signed)
 Office Visit Note  Patient: Samantha Valentine             Date of Birth: Aug 09, 1946           MRN: [id number]             PCP: Clemens Curt, MD Referring: Tower, Manley Seeds, MD Visit Date: 03/22/2024 Occupation: @GUAROCC @  Subjective:  Joint stiffness  History of Present Illness: Samantha Valentine is a 78 y.o. female with seropositive rheumatoid arthritis, osteoarthritis, fibromyalgia and osteoporosis.  She returns today after her last visit in February 2025.  She continues to have some joint pain and stiffness.  She had a SI joint injection at the pain clinic which was helpful.  She denies any joint swelling.  She continues to have stiffness in the morning and also after prolonged sitting.  She states she is been using a cane for ambulation which has been helpful.    Activities of Daily Living:  Patient reports morning stiffness for 1.5-2 hours.   Patient Reports nocturnal pain.  Difficulty dressing/grooming: Reports Difficulty climbing stairs: Reports Difficulty getting out of chair: Reports Difficulty using hands for taps, buttons, cutlery, and/or writing: Reports  Review of Systems  Constitutional:  Positive for fatigue.  HENT:  Positive for mouth dryness. Negative for mouth sores.   Eyes:  Positive for dryness.  Respiratory:  Positive for shortness of breath.   Cardiovascular:  Negative for chest pain and palpitations.  Gastrointestinal:  Positive for constipation. Negative for blood in stool and diarrhea.  Endocrine: Negative for increased urination.  Genitourinary:  Negative for involuntary urination.  Musculoskeletal:  Positive for joint pain, gait problem, joint pain, myalgias, muscle weakness, morning stiffness and myalgias. Negative for joint swelling and muscle tenderness.  Skin:  Negative for color change, rash, hair loss and sensitivity to sunlight.  Allergic/Immunologic: Negative for susceptible to infections.  Neurological:  Positive for headaches. Negative for  dizziness.  Hematological:  Negative for swollen glands.  Psychiatric/Behavioral:  Positive for sleep disturbance. Negative for depressed mood. The patient is not nervous/anxious.     PMFS History:  Patient Active Problem List   Diagnosis Date Noted   Sacroiliac pain 01/25/2024   Iron  deficiency anemia 12/27/2023   Swelling of eyelid 06/29/2023   Anemia 03/29/2023   Fatigue 03/29/2023   Pedal edema 03/29/2023   Elevated blood pressure reading 03/29/2023   Shortness of breath 03/29/2023   Vitamin B12 deficiency 03/29/2023   Low serum iron  03/29/2023   Insomnia 12/15/2022   History of breast cancer 12/23/2021   Elevated glucose level 11/23/2018   Routine general medical examination at a health care facility 08/09/2018   DDD (degenerative disc disease), thoracolumbar 06/28/2018   DDD (degenerative disc disease), cervical 06/28/2018   Atherosclerosis of aorta (HCC) 03/11/2018   Sleep disturbance 06/18/2017   Tachycardia 05/25/2017   Estrogen deficiency 03/22/2017   Malignant neoplasm of upper-outer quadrant of left breast in female, estrogen receptor negative (HCC) 02/04/2017   Tendinopathy of right shoulder 01/25/2017   High risk medication use 09/29/2016   DJD (degenerative joint disease), cervical 09/29/2016   History of right hip replacement 09/29/2016   Eczema 07/13/2014   Adverse effect of immunosuppressive drug 04/27/2012   ANXIETY DEPRESSION 10/28/2008   Spinal stenosis 12/29/2007   Hypothyroidism 12/28/2007   Vitamin D  deficiency 12/28/2007   HEARING LOSS 12/28/2007   Seropositive rheumatoid arthritis of multiple sites (HCC) 12/28/2007   DDD (degenerative disc disease), lumbar 12/28/2007   Fibromyalgia 12/28/2007  Osteoporosis 12/28/2007   MIGRAINES, HX OF 12/28/2007    Past Medical History:  Diagnosis Date   Anxiety    Arthritis    RA   Breast cancer (HCC) 01/29/2017   Breast mass 1 year    Cancer (HCC) 01/29/2017   left breast INVASIVE MAMMARY CARCINOMA,  ER/PR positive   Cataract 2019   resolved with surgery   Collagen vascular disease (HCC)    Rhematoid Arthritis   Complication of anesthesia    DDD (degenerative disc disease)    in neck   Depression    Dyspnea    Emphysema of lung (HCC)    Fibromyalgia    GERD (gastroesophageal reflux disease)    NO MEDS   History of hiatal hernia    History of kidney stones    MULTIPLE KIDNEY STONES BIL   HOH (hard of hearing)    AIDS   Hypothyroidism    Macular degeneration, bilateral    Migraine    Osteoporosis    Palpitations    Personal history of chemotherapy    prior to mastectomy   PONV (postoperative nausea and vomiting)    AFTER FIRST CATARACT   Rheumatoid arthritis (HCC)     Family History  Problem Relation Age of Onset   Hypertension Mother    Endometrial cancer Mother    Osteoarthritis Mother    Hearing loss Mother    Alcohol abuse Father    Diabetes Father    Stroke Father    Liver disease Sister    Breast cancer Maternal Aunt    Breast cancer Paternal Aunt 73   Rheum arthritis Maternal Grandmother    Arthritis Maternal Grandmother    Diabetes Paternal Grandmother    Parkinson's disease Paternal Grandfather    Past Surgical History:  Procedure Laterality Date   BREAST BIOPSY Left 01/29/2017   US  guided biopsy INVASIVE MAMMARY CARCINOMA   CATARACT EXTRACTION W/PHACO Left 04/28/2018   Procedure: CATARACT EXTRACTION PHACO AND INTRAOCULAR LENS PLACEMENT (IOC);  Surgeon: Annell Kidney, MD;  Location: [facility];  Service: Ophthalmology;  Laterality: Left;  Lot # [id number] H US    1:03 AP%   13.4 CDE    8.40   CATARACT EXTRACTION W/PHACO Right 06/09/2018   Procedure: CATARACT EXTRACTION PHACO AND INTRAOCULAR LENS PLACEMENT (IOC);  Surgeon: Annell Kidney, MD;  Location: [facility];  Service: Ophthalmology;  Laterality: Right;  lot# [id number] h us  0:55 ap 16.3% cde 8.21   HIP ARTHROPLASTY     JOINT REPLACEMENT Right 2003   total hip replacement   LITHOTRIPSY   1997   kidney stone   MASTECTOMY Left 01/29/2017   MASTECTOMY W/ SENTINEL NODE BIOPSY Left 05/18/2017   Procedure: MASTECTOMY WITH SENTINEL LYMPH NODE BIOPSY;  Surgeon: Marshall Skeeter, MD;  Location: [facility] OR[facility];  Service: General;  Laterality: Left;   PORT-A-CATH REMOVAL  09/2017   PORTACATH PLACEMENT Right 02/15/2017   Procedure: INSERTION PORT-A-CATH;  Surgeon: Marshall Skeeter, MD;  Location: [facility];  Service: General;  Laterality: Right;   TONSILLECTOMY  1970   Social History   Social History Narrative   Not on file   Immunization History  Administered Date(s) Administered   Fluad Trivalent(High Dose 65+) 06/29/2023   Influenza, High Dose Seasonal PF 07/28/2017, 07/17/2018, 07/26/2019   Influenza,inj,Quad PF,6+ Mos 07/05/2013, 07/13/2014, 08/20/2015   Influenza-Unspecified 07/19/2016, 07/28/2017, 07/17/2018   PFIZER(Purple Top)SARS-COV-2 Vaccination 10/30/2019, 11/18/2019, 06/14/2020   Pneumococcal Conjugate-13 08/20/2015   Pneumococcal Polysaccharide-23 11/03/2006, 07/05/2013   Td 05/03/2002  Objective: Vital Signs: BP 136/86 (BP Location: Left Arm, Patient Position: Sitting, Cuff Size: Normal)   Pulse 94   Resp 14   Ht 5\' 4"  (1.626 m)   Wt 169 lb (76.7 kg)   BMI 29.01 kg/m    Physical Exam Vitals and nursing note reviewed.  Constitutional:      Appearance: She is well-developed.  HENT:     Head: Normocephalic and atraumatic.  Eyes:     Conjunctiva/sclera: Conjunctivae normal.  Cardiovascular:     Rate and Rhythm: Normal rate and regular rhythm.     Heart sounds: Normal heart sounds.  Pulmonary:     Effort: Pulmonary effort is normal.     Breath sounds: Normal breath sounds.  Abdominal:     General: Bowel sounds are normal.     Palpations: Abdomen is soft.  Musculoskeletal:     Cervical back: Normal range of motion.  Lymphadenopathy:     Cervical: No cervical adenopathy.  Skin:    General: Skin is warm and dry.     Capillary Refill: Capillary  refill takes less than 2 seconds.  Neurological:     Mental Status: She is alert and oriented to person, place, and time.  Psychiatric:        Behavior: Behavior normal.      Musculoskeletal Exam: She had limited range of motion of the cervical spine with lateral rotation and stiffness.  Thoracic kyphosis was noted.  There was no tenderness over thoracic region.  She had some discomfort to the lumbar region and SI joints.  Shoulders, elbows were in good range of motion.  There was no synovitis of her wrist joints, MCPs or PIPs.  Bilateral PIP and DIP thickening was noted.  Hip joints and knee joints were in good range of motion.  She had bilateral pedal edema.  There was no tenderness over ankles or MTPs.  CDAI Exam: CDAI Score: -- Patient Global: --; Provider Global: -- Swollen: --; Tender: -- Joint Exam 03/22/2024   No joint exam has been documented for this visit   There is currently no information documented on the homunculus. Go to the Rheumatology activity and complete the homunculus joint exam.  Investigation: No additional findings.  Imaging: No results found.  Recent Labs: Lab Results  Component Value Date   WBC 8.3 12/16/2023   HGB 11.0 (L) 12/16/2023   PLT 401 (H) 12/16/2023   NA 139 12/16/2023   K 4.5 12/16/2023   CL 105 12/16/2023   CO2 26 12/16/2023   GLUCOSE 99 12/16/2023   BUN 17 12/16/2023   CREATININE 0.67 12/16/2023   BILITOT 0.3 12/16/2023   ALKPHOS 75 09/09/2023   AST 12 12/16/2023   ALT 9 12/16/2023   PROT 6.7 12/16/2023   ALBUMIN 3.8 09/09/2023   CALCIUM 9.4 12/16/2023   GFRAA 101 11/06/2020   QFTBGOLD NEGATIVE 09/03/2017   QFTBGOLDPLUS NEGATIVE 10/18/2023    Speciality Comments: No specialty comments available.  Procedures:  No procedures performed Allergies: Adhesive [tape], Cymbalta  [duloxetine  hcl], Hydroxychloroquine sulfate, Ibandronate sodium, and Risedronate sodium   Assessment / Plan:     Visit Diagnoses: Seropositive  rheumatoid arthritis of multiple sites (HCC) - +RF, erosive disease: -Patient reports no flares since the last visit.  She has been taking Enbrel  50 mg subcu every 7 days without any interruption now.  No synovitis was noted on the examination.  She continues to have some stiffness after prolonged sitting and early in the morning.  Prescription refill for Enbrel   was sent.  Plan: etanercept  (ENBREL  SURECLICK) 50 MG/ML injection  High risk medication use - Enbrel  SureClick 50 mg sq injections every 7 days. (d/c SSZ in the past due to constipation). -December 16, 2023 CBC showed hemoglobin of 11.7, CMP was normal, TB Gold was negative on October 18, 2023.  She was advised to get labs every 3 months.  Information reimmunization was placed in the AVS.  She was advised to hold Enbrel  if she develops an infection resume after the infection resolves.  Annual skin examination to screen for skin cancer was advised.  Use of sun protection and sunscreen was advised.  Plan: CBC with Differential/Platelet, Comprehensive metabolic panel with GFR  Primary osteoarthritis of both hands-bilateral PIP and DIP thickening was noted.  Joint protection muscle strengthening was discussed.  History of right hip replacement-she had some limitation with range of motion without any discomfort.  Pain in both feet-she denies any discomfort today.  DDD (degenerative disc disease), cervical-she had limited lateral rotation without discomfort.  DDD (degenerative disc disease), thoracic-she thoracic kyphosis without any discomfort.  Degeneration of intervertebral disc of lumbar region without discogenic back pain or lower extremity pain-he continues to have some lower back pain.  She has been ambulating with the help of a cane.  She had recent SI joint injection which was helpful.  Fibromyalgia-she continues to have some generalized pain and discomfort.  She had positive tender points.  She has been going to pain  management.  Age-related osteoporosis without current pathological fracture - DEXA updated May 06, 2017 T score -2.4.  Patient was treated with Forteo and Prolia  in the past.  She does not want repeat DEXA scan or treatment.  History of vitamin D  deficiency-she has been getting vitamin D  level through her PCP.  She is currently on vitamin D  1000 units daily.  History of anemia-stable.  Other medical problems are listed as follows:  Malignant neoplasm of upper-outer quadrant of left breast in female, estrogen receptor negative (HCC)  History of hearing loss  History of depression  History of hypothyroidism  History of anxiety  History of migraine  Vitamin B12 deficiency  Dyslipidemia  Iron  deficiency  Orders: Orders Placed This Encounter  Procedures   CBC with Differential/Platelet   Comprehensive metabolic panel with GFR   Meds ordered this encounter  Medications   etanercept  (ENBREL  SURECLICK) 50 MG/ML injection    Sig: Inject 50 mg into the skin once a week.    Dispense:  12 mL    Refill:  0    Prescription Type::   Renewal     Follow-Up Instructions: Return in about 5 months (around 08/22/2024) for Rheumatoid arthritis, Osteoarthritis, Osteoporosis.   Nicholas Bari, MD  Note - This record has been created using Animal nutritionist.  Chart creation errors have been sought, but may not always  have been located. Such creation errors do not reflect on  the standard of medical care.

## 2024-03-22 ENCOUNTER — Ambulatory Visit: Payer: PPO | Attending: Rheumatology | Admitting: Rheumatology

## 2024-03-22 ENCOUNTER — Encounter: Payer: Self-pay | Admitting: Rheumatology

## 2024-03-22 VITALS — BP 136/86 | HR 94 | Resp 14 | Ht 63.5 in | Wt 169.0 lb

## 2024-03-22 DIAGNOSIS — M5030 Other cervical disc degeneration, unspecified cervical region: Secondary | ICD-10-CM

## 2024-03-22 DIAGNOSIS — M797 Fibromyalgia: Secondary | ICD-10-CM

## 2024-03-22 DIAGNOSIS — Z96641 Presence of right artificial hip joint: Secondary | ICD-10-CM

## 2024-03-22 DIAGNOSIS — Z862 Personal history of diseases of the blood and blood-forming organs and certain disorders involving the immune mechanism: Secondary | ICD-10-CM

## 2024-03-22 DIAGNOSIS — E785 Hyperlipidemia, unspecified: Secondary | ICD-10-CM

## 2024-03-22 DIAGNOSIS — C50412 Malignant neoplasm of upper-outer quadrant of left female breast: Secondary | ICD-10-CM

## 2024-03-22 DIAGNOSIS — M5134 Other intervertebral disc degeneration, thoracic region: Secondary | ICD-10-CM

## 2024-03-22 DIAGNOSIS — M0579 Rheumatoid arthritis with rheumatoid factor of multiple sites without organ or systems involvement: Secondary | ICD-10-CM

## 2024-03-22 DIAGNOSIS — M79671 Pain in right foot: Secondary | ICD-10-CM | POA: Diagnosis not present

## 2024-03-22 DIAGNOSIS — E538 Deficiency of other specified B group vitamins: Secondary | ICD-10-CM

## 2024-03-22 DIAGNOSIS — M51369 Other intervertebral disc degeneration, lumbar region without mention of lumbar back pain or lower extremity pain: Secondary | ICD-10-CM | POA: Diagnosis not present

## 2024-03-22 DIAGNOSIS — M19042 Primary osteoarthritis, left hand: Secondary | ICD-10-CM

## 2024-03-22 DIAGNOSIS — Z79899 Other long term (current) drug therapy: Secondary | ICD-10-CM | POA: Diagnosis not present

## 2024-03-22 DIAGNOSIS — M19041 Primary osteoarthritis, right hand: Secondary | ICD-10-CM

## 2024-03-22 DIAGNOSIS — Z8669 Personal history of other diseases of the nervous system and sense organs: Secondary | ICD-10-CM

## 2024-03-22 DIAGNOSIS — Z171 Estrogen receptor negative status [ER-]: Secondary | ICD-10-CM

## 2024-03-22 DIAGNOSIS — M810 Age-related osteoporosis without current pathological fracture: Secondary | ICD-10-CM | POA: Diagnosis not present

## 2024-03-22 DIAGNOSIS — E611 Iron deficiency: Secondary | ICD-10-CM

## 2024-03-22 DIAGNOSIS — Z8639 Personal history of other endocrine, nutritional and metabolic disease: Secondary | ICD-10-CM

## 2024-03-22 DIAGNOSIS — Z8659 Personal history of other mental and behavioral disorders: Secondary | ICD-10-CM

## 2024-03-22 DIAGNOSIS — M79672 Pain in left foot: Secondary | ICD-10-CM

## 2024-03-22 MED ORDER — ENBREL SURECLICK 50 MG/ML ~~LOC~~ SOAJ: 50.0000 mg | SUBCUTANEOUS | 0 refills | Status: DC

## 2024-03-22 NOTE — Patient Instructions (Addendum)
 Standing Labs We placed an order today for your standing lab work.   Please have your standing labs drawn in September and every 3 months  Please have your labs drawn 2 weeks prior to your appointment so that the provider can discuss your lab results at your appointment, if possible.  Please note that you may see your imaging and lab results in MyChart before we have reviewed them. We will contact you once all results are reviewed. Please allow our office up to 72 hours to thoroughly review all of the results before contacting the office for clarification of your results.  WALK-IN LAB HOURS  Monday through Thursday from 8:00 am -12:30 pm and 1:00 pm-4:00 pm and Friday from 8:00 am-12:00 pm.  Patients with office visits requiring labs will be seen before walk-in labs.  You may encounter longer than normal wait times. Please allow additional time. Wait times may be shorter on  Monday and Thursday afternoons.  We do not book appointments for walk-in labs. We appreciate your patience and understanding with our staff.   Labs are drawn by [facility]. Please bring your co-pay at the time of your lab draw.  You may receive a bill from [facility] for your lab work.  Please note if you are on Hydroxychloroquine and and an order has been placed for a Hydroxychloroquine level,  you will need to have it drawn 4 hours or more after your last dose.  If you wish to have your labs drawn at another location, please call the office 24 hours in advance so we can fax the orders.  The office is located at [address], [address], [place], [place] [address]   If you have any questions regarding directions or hours of operation,  please call [phone].   As a reminder, please drink plenty of water prior to coming for your lab work. Thanks!   Vaccines You are taking a medication(s) that can suppress your immune system.  The following immunizations are recommended: Flu annually Covid-19  Td/Tdap (tetanus,  diphtheria, pertussis) every 10 years Pneumonia (Prevnar 15 then Pneumovax 23 at least 1 year apart.  Alternatively, can take Prevnar 20 without needing additional dose) Shingrix: 2 doses from 4 weeks to 6 months apart  Please check with your PCP to make sure you are up to date.   If you have signs or symptoms of an infection or start antibiotics: First, call your PCP for workup of your infection. Hold your medication through the infection, until you complete your antibiotics, and until symptoms resolve if you take the following: Injectable medication (Actemra, Benlysta, Cimzia, Cosentyx, Enbrel , Humira, Kevzara, Orencia, Remicade, Simponi, Stelara, Taltz, Tremfya) Methotrexate  Leflunomide (Arava) Mycophenolate (Cellcept) Cloria Danger, Olumiant, or Rinvoq  Please schedule annual skin examination to screen for skin cancer while you are on Enbrel .  Please use sunscreen and sun protection.

## 2024-03-23 ENCOUNTER — Ambulatory Visit: Payer: Self-pay | Admitting: Rheumatology

## 2024-03-23 LAB — COMPREHENSIVE METABOLIC PANEL WITH GFR
AG Ratio: 1.4 (calc) (ref 1.0–2.5)
ALT: 17 U/L (ref 6–29)
AST: 16 U/L (ref 10–35)
Albumin: 3.8 g/dL (ref 3.6–5.1)
Alkaline phosphatase (APISO): 87 U/L (ref 37–153)
BUN: 14 mg/dL (ref 7–25)
CO2: 27 mmol/L (ref 20–32)
Calcium: 9.4 mg/dL (ref 8.6–10.4)
Chloride: 105 mmol/L (ref 98–110)
Creat: 0.64 mg/dL (ref 0.60–1.00)
Globulin: 2.7 g/dL (ref 1.9–3.7)
Glucose, Bld: 104 mg/dL — ABNORMAL HIGH (ref 65–99)
Potassium: 4.5 mmol/L (ref 3.5–5.3)
Sodium: 140 mmol/L (ref 135–146)
Total Bilirubin: 0.4 mg/dL (ref 0.2–1.2)
Total Protein: 6.5 g/dL (ref 6.1–8.1)
eGFR: 92 mL/min/{1.73_m2} (ref >=60)

## 2024-03-23 LAB — CBC WITH DIFFERENTIAL/PLATELET
Absolute Lymphocytes: 1860 {cells}/uL (ref 850–3900)
Absolute Monocytes: 690 {cells}/uL (ref 200–950)
Basophils Absolute: 30 {cells}/uL (ref 0–200)
Basophils Relative: 0.4 %
Eosinophils Absolute: 90 {cells}/uL (ref 15–500)
Eosinophils Relative: 1.2 %
HCT: 45.2 % — ABNORMAL HIGH (ref 35.0–45.0)
Hemoglobin: 14.5 g/dL (ref 11.7–15.5)
MCH: 28.6 pg (ref 27.0–33.0)
MCHC: 32.1 g/dL (ref 32.0–36.0)
MCV: 89.2 fL (ref 80.0–100.0)
MPV: 11.2 fL (ref 7.5–12.5)
Monocytes Relative: 9.2 %
Neutro Abs: 4830 {cells}/uL (ref 1500–7800)
Neutrophils Relative %: 64.4 %
Platelets: 245 10*3/uL (ref 140–400)
RBC: 5.07 10*6/uL (ref 3.80–5.10)
RDW: 16.1 % — ABNORMAL HIGH (ref 11.0–15.0)
Total Lymphocyte: 24.8 %
WBC: 7.5 10*3/uL (ref 3.8–10.8)

## 2024-03-23 NOTE — Progress Notes (Signed)
 CBC and CMP are stable.

## 2024-03-28 ENCOUNTER — Other Ambulatory Visit: Payer: Self-pay

## 2024-03-28 DIAGNOSIS — M0579 Rheumatoid arthritis with rheumatoid factor of multiple sites without organ or systems involvement: Secondary | ICD-10-CM

## 2024-04-05 ENCOUNTER — Other Ambulatory Visit: Payer: Self-pay | Admitting: Physician Assistant

## 2024-04-10 ENCOUNTER — Other Ambulatory Visit: Payer: Self-pay | Admitting: Physician Assistant

## 2024-04-10 NOTE — Telephone Encounter (Signed)
 Last Fill: 09/16/2022  Labs: 03/22/2024 CBC and CMP are stable.   Next Visit: 08/23/2024  Last Visit: 03/22/2024  DX: Seropositive rheumatoid arthritis of multiple sites   Current Dose per office note 03/22/2024: not discussed  Okay to refill Celebrex ?

## 2024-05-01 ENCOUNTER — Other Ambulatory Visit

## 2024-05-02 ENCOUNTER — Ambulatory Visit: Admitting: Oncology

## 2024-05-02 ENCOUNTER — Ambulatory Visit

## 2024-05-03 ENCOUNTER — Ambulatory Visit: Payer: Self-pay

## 2024-05-03 NOTE — Telephone Encounter (Signed)
 FYI Only or Action Required?: FYI only for provider.  Patient was last seen in primary care on 02/03/2024 by Watt Mirza, MD.  Called Nurse Triage reporting Leg Swelling.  Symptoms began several weeks ago.  Interventions attempted: Prescription medications: Celebrex  and gabepentin and Rest, hydration, or home remedies.  Symptoms are: unchanged.  Triage Disposition: See Physician Within 24 Hours  Patient/caregiver understands and will follow disposition?: Yes  Offered appt for tomorrow , but only wants to see Dr. Randeen, so booked first available.    Copied from CRM [id number]. Topic: Clinical - Red Word Triage >> May 03, 2024  2:23 PM Burnard DEL wrote: Red Word that prompted transfer to Nurse Triage: swelling in both legs and feet up Reason for Disposition  [1] MODERATE leg swelling (e.g., swelling extends up to knees) AND [2] new-onset or getting worse  Answer Assessment - Initial Assessment Questions 1. ONSET: When did the swelling start? (e.g., minutes, hours, days)     A week or so  2. LOCATION: What part of the leg is swollen?  Are both legs swollen or just one leg?     Both legs but left worse 3. SEVERITY: How bad is the swelling? (e.g., localized; mild, moderate, severe)     Mild-mod 4. REDNESS: Is there redness or signs of infection?     no 5. PAIN: Is the swelling painful to touch? If Yes, ask: How painful is it?   (Scale 1-10; mild, moderate or severe)     6/10 6. FEVER: Do you have a fever? If Yes, ask: What is it, how was it measured, and when did it start?      no 7. CAUSE: What do you think is causing the leg swelling?     unknown 8. MEDICAL HISTORY: Do you have a history of blood clots (e.g., DVT), cancer, heart failure, kidney disease, or liver failure?     no 9. RECURRENT SYMPTOM: Have you had leg swelling before? If Yes, ask: When was the last time? What happened that time?     yes 10. OTHER SYMPTOMS: Do you have any other  symptoms? (e.g., chest pain, difficulty breathing)       no  Protocols used: Leg Swelling and Edema-A-AH

## 2024-05-03 NOTE — Telephone Encounter (Signed)
 Will see patient then Agree with ER and UC precautions

## 2024-05-04 ENCOUNTER — Encounter: Payer: Self-pay | Admitting: *Deleted

## 2024-05-04 ENCOUNTER — Other Ambulatory Visit: Payer: Self-pay | Admitting: Family Medicine

## 2024-05-04 ENCOUNTER — Encounter: Payer: Self-pay | Admitting: Oncology

## 2024-05-05 ENCOUNTER — Encounter: Payer: Self-pay | Admitting: Family Medicine

## 2024-05-05 ENCOUNTER — Ambulatory Visit (INDEPENDENT_AMBULATORY_CARE_PROVIDER_SITE_OTHER): Admitting: Family Medicine

## 2024-05-05 VITALS — BP 130/78 | HR 101 | Temp 98.3°F | Ht 63.5 in | Wt 166.1 lb

## 2024-05-05 DIAGNOSIS — E039 Hypothyroidism, unspecified: Secondary | ICD-10-CM

## 2024-05-05 DIAGNOSIS — E611 Iron deficiency: Secondary | ICD-10-CM

## 2024-05-05 DIAGNOSIS — R7309 Other abnormal glucose: Secondary | ICD-10-CM

## 2024-05-05 DIAGNOSIS — K5900 Constipation, unspecified: Secondary | ICD-10-CM

## 2024-05-05 DIAGNOSIS — M51369 Other intervertebral disc degeneration, lumbar region without mention of lumbar back pain or lower extremity pain: Secondary | ICD-10-CM | POA: Diagnosis not present

## 2024-05-05 DIAGNOSIS — E559 Vitamin D deficiency, unspecified: Secondary | ICD-10-CM | POA: Diagnosis not present

## 2024-05-05 DIAGNOSIS — E538 Deficiency of other specified B group vitamins: Secondary | ICD-10-CM

## 2024-05-05 DIAGNOSIS — R600 Localized edema: Secondary | ICD-10-CM

## 2024-05-05 DIAGNOSIS — M0579 Rheumatoid arthritis with rheumatoid factor of multiple sites without organ or systems involvement: Secondary | ICD-10-CM

## 2024-05-05 MED ORDER — TIZANIDINE HCL 4 MG PO TABS: 4.0000 mg | ORAL_TABLET | Freq: Three times a day (TID) | ORAL | 0 refills | Status: DC | PRN

## 2024-05-05 NOTE — Telephone Encounter (Signed)
 Pt has acute appt today will hold until we discuss it with her

## 2024-05-05 NOTE — Patient Instructions (Addendum)
 Miralax over the counter is a great option for constipation  Start with one dose daily and after 3-4 days titrate up or down based on need   Keep up your fluid intake Elevate feet when you can tolerate it   Avoid excessive sodium in diet   Hold the celebrex  for now-let's see if it helps the swelling and the constipation   Try tizanidine (muscle relaxer) for back pain / caution of sedation   Labs today   We may consider some lasix in the future

## 2024-05-05 NOTE — Progress Notes (Signed)
 Subjective:    Patient ID: Samantha Valentine, female    DOB: 05-04-1946, 78 y.o.   MRN: [id number]  HPI  Wt Readings from Last 3 Encounters:  05/05/24 166 lb 2 oz (75.4 kg)  03/22/24 169 lb (76.7 kg)  02/03/24 172 lb (78 kg)   28.97 kg/m  Vitals:   05/05/24 1455  BP: 130/78  Pulse: (!) 101  Temp: 98.3 F (36.8 C)  SpO2: 96%    Pt presents for c/o  Leg and feet swelling  Back pain  Constipation Hypothyroidism    Leg edema  Worse then it has ever been for the past several weeks  Elevates when able / that makes her back pain worse  Cannot pull on support stockings   Is watching sodium   Some shortness of breath with exertion and with hot weather  Is deconditioned but cannot move as much  No pnd  No orthopmea     Had neg venous us  in past    Has chronic back and SI pain  SI joint injection did help  Did some PT at home also  Used a TENS unit  Heat and ice  Lidocaine  patches  Took more celebrex   Gabapentin  - no more than 300 mg total per day     Then pulled a muscle in her back  Trying to get past that   More constipated  Constantly bloated  Has to strain to go  Used stool softeners  Not miralax     Lab Results  Component Value Date   NA 139 05/05/2024   K 4.2 05/05/2024   CO2 24 05/05/2024   GLUCOSE 97 05/05/2024   BUN 15 05/05/2024   CREATININE 0.69 05/05/2024   CALCIUM 9.4 05/05/2024   GFR 85.88 09/09/2023   EGFR 90 05/05/2024   GFRNONAA 87 11/06/2020   Lab Results  Component Value Date   ALT 10 05/05/2024   AST 13 05/05/2024   ALKPHOS 75 09/09/2023   BILITOT 0.4 05/05/2024    Past iron  def  Lab Results  Component Value Date   WBC 7.5 03/22/2024   HGB 14.5 03/22/2024   HCT 45.2 (H) 03/22/2024   MCV 89.2 03/22/2024   PLT 245 03/22/2024     Glucose Lab Results  Component Value Date   HGBA1C 6.0 (H) 05/05/2024   HGBA1C 5.8 12/15/2022   HGBA1C 5.6 12/23/2021       Hypothyroid Lab Results  Component Value  Date   TSH 0.88 05/05/2024   Levothyroxine  125 mcg daily   History of lumbar DDD Also issues with SI joint   Gabapentin   Celebrex    RA On enbrel     Patient Active Problem List   Diagnosis Date Noted   Constipation 05/05/2024   Sacroiliac pain 01/25/2024   Iron  deficiency anemia 12/27/2023   Swelling of eyelid 06/29/2023   Anemia 03/29/2023   Fatigue 03/29/2023   Pedal edema 03/29/2023   Elevated blood pressure reading 03/29/2023   Shortness of breath 03/29/2023   Vitamin B12 deficiency 03/29/2023   Low serum iron  03/29/2023   Insomnia 12/15/2022   History of breast cancer 12/23/2021   Elevated glucose level 11/23/2018   Routine general medical examination at a health care facility 08/09/2018   DDD (degenerative disc disease), thoracolumbar 06/28/2018   DDD (degenerative disc disease), cervical 06/28/2018   Atherosclerosis of aorta (HCC) 03/11/2018   Sleep disturbance 06/18/2017   Tachycardia 05/25/2017   Estrogen deficiency 03/22/2017   Malignant neoplasm of upper-outer quadrant  of left breast in female, estrogen receptor negative (HCC) 02/04/2017   Tendinopathy of right shoulder 01/25/2017   High risk medication use 09/29/2016   DJD (degenerative joint disease), cervical 09/29/2016   History of right hip replacement 09/29/2016   Eczema 07/13/2014   Adverse effect of immunosuppressive drug 04/27/2012   ANXIETY DEPRESSION 10/28/2008   Spinal stenosis 12/29/2007   Hypothyroidism 12/28/2007   Vitamin D  deficiency 12/28/2007   HEARING LOSS 12/28/2007   Seropositive rheumatoid arthritis of multiple sites (HCC) 12/28/2007   DDD (degenerative disc disease), lumbar 12/28/2007   Fibromyalgia 12/28/2007   Osteoporosis 12/28/2007   MIGRAINES, HX OF 12/28/2007   Past Medical History:  Diagnosis Date   Anxiety    Arthritis    RA   Breast cancer (HCC) 01/29/2017   Breast mass 1 year    Cancer (HCC) 01/29/2017   left breast INVASIVE MAMMARY CARCINOMA, ER/PR positive    Cataract 2019   resolved with surgery   Collagen vascular disease (HCC)    Rhematoid Arthritis   Complication of anesthesia    DDD (degenerative disc disease)    in neck   Depression    Dyspnea    Emphysema of lung (HCC)    Fibromyalgia    GERD (gastroesophageal reflux disease)    NO MEDS   History of hiatal hernia    History of kidney stones    MULTIPLE KIDNEY STONES BIL   HOH (hard of hearing)    AIDS   Hypothyroidism    Macular degeneration, bilateral    Migraine    Osteoporosis    Palpitations    Personal history of chemotherapy    prior to mastectomy   PONV (postoperative nausea and vomiting)    AFTER FIRST CATARACT   Rheumatoid arthritis (HCC)    Past Surgical History:  Procedure Laterality Date   BREAST BIOPSY Left 01/29/2017   US  guided biopsy INVASIVE MAMMARY CARCINOMA   CATARACT EXTRACTION W/PHACO Left 04/28/2018   Procedure: CATARACT EXTRACTION PHACO AND INTRAOCULAR LENS PLACEMENT (IOC);  Surgeon: Mittie Gaskin, MD;  Location: [facility];  Service: Ophthalmology;  Laterality: Left;  Lot # [id number] H US    1:03 AP%   13.4 CDE    8.40   CATARACT EXTRACTION W/PHACO Right 06/09/2018   Procedure: CATARACT EXTRACTION PHACO AND INTRAOCULAR LENS PLACEMENT (IOC);  Surgeon: Mittie Gaskin, MD;  Location: [facility];  Service: Ophthalmology;  Laterality: Right;  lot# [id number] h us  0:55 ap 16.3% cde 8.21   HIP ARTHROPLASTY     JOINT REPLACEMENT Right 2003   total hip replacement   LITHOTRIPSY  1997   kidney stone   MASTECTOMY Left 01/29/2017   MASTECTOMY W/ SENTINEL NODE BIOPSY Left 05/18/2017   Procedure: MASTECTOMY WITH SENTINEL LYMPH NODE BIOPSY;  Surgeon: Dessa Reyes ORN, MD;  Location: [facility];  Service: General;  Laterality: Left;   PORT-A-CATH REMOVAL  09/2017   PORTACATH PLACEMENT Right 02/15/2017   Procedure: INSERTION PORT-A-CATH;  Surgeon: Reyes ORN Dessa, MD;  Location: [facility];  Service: General;  Laterality: Right;   TONSILLECTOMY  1970    Social History   Tobacco Use   Smoking status: Former    Current packs/day: 0.00    Average packs/day: 1.5 packs/day for 25.0 years (37.5 ttl pk-yrs)    Types: Cigarettes    Start date: 10/19/1958    Quit date: 10/20/1983    Years since quitting: 40.5    Passive exposure: Past   Smokeless tobacco: Never  Vaping Use   Vaping status: Never  Used  Substance Use Topics   Alcohol use: Not Currently   Drug use: Never   Family History  Problem Relation Age of Onset   Hypertension Mother    Endometrial cancer Mother    Osteoarthritis Mother    Hearing loss Mother    Alcohol abuse Father    Diabetes Father    Stroke Father    Liver disease Sister    Breast cancer Maternal Aunt    Breast cancer Paternal Aunt 57   Rheum arthritis Maternal Grandmother    Arthritis Maternal Grandmother    Diabetes Paternal Grandmother    Parkinson's disease Paternal Grandfather    Allergies  Allergen Reactions   Adhesive [Tape] Other (See Comments)    Skin blistered (PAPER TAPE OK)   Cymbalta  [Duloxetine  Hcl]     Headache Inc bp   Hydroxychloroquine Sulfate Rash   Ibandronate Sodium Rash   Risedronate Sodium Rash   Current Outpatient Medications on File Prior to Visit  Medication Sig Dispense Refill   aspirin EC 81 MG tablet Take 81 mg by mouth daily.     cholecalciferol (VITAMIN D ) 1000 units tablet Take 1,000 Units by mouth daily.     cyanocobalamin  (VITAMIN B12) 1000 MCG tablet Take 1,000 mcg by mouth daily.     etanercept  (ENBREL  SURECLICK) 50 MG/ML injection Inject 50 mg into the skin once a week. 12 mL 0   gabapentin  (NEURONTIN ) 100 MG capsule TAKE 1 CAPSULE BY MOUTH THREE TIMES DAILY (Patient taking differently: 100 mg 3 (three) times daily as needed. TAKE 1 CAPSULE BY MOUTH THREE TIMES DAILY) 270 capsule 1   gabapentin  (NEURONTIN ) 300 MG capsule Take 1 capsule (300 mg total) by mouth 3 (three) times daily. (Patient taking differently: Take 300 mg by mouth as needed.) 90 capsule 3    levothyroxine  (SYNTHROID ) 125 MCG tablet TAKE 1 TABLET BY MOUTH ONCE DAILY BEFORE BREAKFAST 90 tablet 2   LORazepam  (ATIVAN ) 1 MG tablet TAKE 1 TABLET BY MOUTH AT BEDTIME AS NEEDED FOR ANXIETY 30 tablet 1   celecoxib  (CELEBREX ) 200 MG capsule TAKE 1 CAPSULE BY MOUTH TWICE DAILY WITH FOOD AS NEEDED (Patient not taking: Reported on 05/05/2024) 180 capsule 0   No current facility-administered medications on file prior to visit.    Review of Systems  Constitutional:  Positive for fatigue. Negative for activity change, appetite change, fever and unexpected weight change.  HENT:  Negative for congestion, ear pain, rhinorrhea, sinus pressure and sore throat.   Eyes:  Negative for pain, redness and visual disturbance.  Respiratory:  Negative for cough, shortness of breath and wheezing.   Cardiovascular:  Positive for leg swelling. Negative for chest pain and palpitations.  Gastrointestinal:  Negative for abdominal pain, blood in stool, constipation and diarrhea.  Endocrine: Negative for polydipsia and polyuria.  Genitourinary:  Negative for dysuria, frequency and urgency.  Musculoskeletal:  Positive for arthralgias and back pain. Negative for myalgias.  Skin:  Negative for pallor and rash.  Allergic/Immunologic: Negative for environmental allergies.  Neurological:  Negative for dizziness, syncope and headaches.  Hematological:  Negative for adenopathy. Does not bruise/bleed easily.  Psychiatric/Behavioral:  Negative for decreased concentration and dysphoric mood. The patient is not nervous/anxious.        Objective:   Physical Exam Constitutional:      General: She is not in acute distress.    Appearance: Normal appearance. She is well-developed. She is not ill-appearing or diaphoretic.     Comments: Overwt   HENT:  Head: Normocephalic and atraumatic.  Eyes:     Conjunctiva/sclera: Conjunctivae normal.     Pupils: Pupils are equal, round, and reactive to light.  Neck:     Thyroid : No  thyromegaly.     Vascular: No carotid bruit or JVD.  Cardiovascular:     Rate and Rhythm: Normal rate and regular rhythm.     Heart sounds: Normal heart sounds.     No gallop.  Pulmonary:     Effort: Pulmonary effort is normal. No respiratory distress.     Breath sounds: Normal breath sounds. No wheezing or rales.  Abdominal:     General: There is no distension or abdominal bruit.     Palpations: Abdomen is soft.  Musculoskeletal:     Cervical back: Normal range of motion and neck supple.     Right lower leg: Edema present.     Left lower leg: Edema present.     Comments: One plus edema/some pitting to just above ankles bilaterally  No tenderness No palp cords No erythema or heat   Baseline RA joint changes  Limited rom of LS No acute neuro findings   Lymphadenopathy:     Cervical: No cervical adenopathy.  Skin:    General: Skin is warm and dry.     Coloration: Skin is not pale.     Findings: No rash.  Neurological:     Mental Status: She is alert.     Cranial Nerves: No cranial nerve deficit.     Motor: No weakness.     Coordination: Coordination normal.     Deep Tendon Reflexes: Reflexes are normal and symmetric. Reflexes normal.  Psychiatric:        Mood and Affect: Mood normal.           Assessment & Plan:   Problem List Items Addressed This Visit       Endocrine   Hypothyroidism   TSH today Taking levothyroxine  125 mcg daily   Some increase in pedal edema lately      Relevant Orders   TSH (Completed)     Musculoskeletal and Integument   DDD (degenerative disc disease), lumbar - Primary   Struggling with pain  In setting of RA SI pain did help with injection Has some some PT at home and using lidocaine  patches  Will be holding celebrex  due to pedal edema Taking gabapentin  -total 300 mg daily   Pain may be mor muscular this time  Will try tizanadine and report back         Other   Vitamin D  deficiency   D level added to labs        Relevant Orders   VITAMIN D  25 Hydroxy (Vit-D Deficiency, Fractures) (Completed)   Vitamin B12 deficiency   B12 added to labs       Relevant Orders   Vitamin B12 (Completed)   Pedal edema   May be multi factorial  No cardiac symptoms  No DVT symptoms  Noted celebrex  and gabapentin  may add (gabapentin  dose is low), along with heat and inability to elevate feet well due to pain / also unable to tolerate supp hose Discussed aiming for low sodium diet  Keep up fluids  Last labs reviewed and new labs ordered  Hold celebrex    Consider lasix if needed       Relevant Orders   Basic metabolic panel with GFR (Completed)   Hepatic function panel (Completed)   Elevated glucose level   A1c ordered today  disc imp of low glycemic diet and wt loss to prevent DM2       Relevant Orders   Hemoglobin A1c (Completed)   Constipation   Plan on holding celebrex  due to pedal edema-this may also help constipation  Discussed need for fluids and high fiber diet  Trial of miralax titrated to result

## 2024-05-06 LAB — HEPATIC FUNCTION PANEL
AG Ratio: 1.4 (calc) (ref 1.0–2.5)
ALT: 10 U/L (ref 6–29)
AST: 13 U/L (ref 10–35)
Albumin: 3.7 g/dL (ref 3.6–5.1)
Alkaline phosphatase (APISO): 81 U/L (ref 37–153)
Bilirubin, Direct: 0.1 mg/dL (ref 0.0–0.2)
Globulin: 2.6 g/dL (ref 1.9–3.7)
Indirect Bilirubin: 0.3 mg/dL (ref 0.2–1.2)
Total Bilirubin: 0.4 mg/dL (ref 0.2–1.2)
Total Protein: 6.3 g/dL (ref 6.1–8.1)

## 2024-05-06 LAB — VITAMIN D 25 HYDROXY (VIT D DEFICIENCY, FRACTURES): Vit D, 25-Hydroxy: 57 ng/mL (ref 30–100)

## 2024-05-06 LAB — TSH: TSH: 0.88 m[IU]/L (ref 0.40–4.50)

## 2024-05-06 LAB — BASIC METABOLIC PANEL WITH GFR
BUN: 15 mg/dL (ref 7–25)
CO2: 24 mmol/L (ref 20–32)
Calcium: 9.4 mg/dL (ref 8.6–10.4)
Chloride: 106 mmol/L (ref 98–110)
Creat: 0.69 mg/dL (ref 0.60–1.00)
Glucose, Bld: 97 mg/dL (ref 65–99)
Potassium: 4.2 mmol/L (ref 3.5–5.3)
Sodium: 139 mmol/L (ref 135–146)
eGFR: 90 mL/min/1.73m2 (ref >=60)

## 2024-05-06 LAB — HEMOGLOBIN A1C
Hgb A1c MFr Bld: 6 % — ABNORMAL HIGH (ref <5.7)
Mean Plasma Glucose: 126 mg/dL
eAG (mmol/L): 7 mmol/L

## 2024-05-06 LAB — VITAMIN B12: Vitamin B-12: 1291 pg/mL — ABNORMAL HIGH (ref 200–1100)

## 2024-05-07 ENCOUNTER — Ambulatory Visit: Payer: Self-pay | Admitting: Family Medicine

## 2024-05-07 NOTE — Assessment & Plan Note (Signed)
A1c ordered today  disc imp of low glycemic diet and wt loss to prevent DM2

## 2024-05-07 NOTE — Assessment & Plan Note (Signed)
 May be multi factorial  No cardiac symptoms  No DVT symptoms  Noted celebrex  and gabapentin  may add (gabapentin  dose is low), along with heat and inability to elevate feet well due to pain / also unable to tolerate supp hose Discussed aiming for low sodium diet  Keep up fluids  Last labs reviewed and new labs ordered  Hold celebrex    Consider lasix if needed

## 2024-05-07 NOTE — Assessment & Plan Note (Signed)
 Struggling with pain  In setting of RA SI pain did help with injection Has some some PT at home and using lidocaine  patches  Will be holding celebrex  due to pedal edema Taking gabapentin  -total 300 mg daily   Pain may be mor muscular this time  Will try tizanadine and report back

## 2024-05-07 NOTE — Assessment & Plan Note (Signed)
D level added to labs  

## 2024-05-07 NOTE — Assessment & Plan Note (Signed)
 Plan on holding celebrex  due to pedal edema-this may also help constipation  Discussed need for fluids and high fiber diet  Trial of miralax titrated to result

## 2024-05-07 NOTE — Assessment & Plan Note (Signed)
 TSH today Taking levothyroxine  125 mcg daily   Some increase in pedal edema lately

## 2024-05-07 NOTE — Assessment & Plan Note (Signed)
 B12 added to labs

## 2024-05-16 ENCOUNTER — Other Ambulatory Visit

## 2024-05-16 MED ORDER — FUROSEMIDE 20 MG PO TABS: 20.0000 mg | ORAL_TABLET | Freq: Every day | ORAL | 0 refills | Status: DC

## 2024-05-17 ENCOUNTER — Ambulatory Visit

## 2024-05-17 ENCOUNTER — Ambulatory Visit: Admitting: Oncology

## 2024-05-19 ENCOUNTER — Encounter: Payer: Self-pay | Admitting: Rheumatology

## 2024-05-22 NOTE — Telephone Encounter (Signed)
 Patient advised Ok to schedule sooner visit for further evaluation if needed. If the swelling is more consistent with edema [place] would recommend evaluation by PCP to see if she needs a change in diuretic. Patient verbalized understanding.   Patient states she has no way to [facility] currently and she will need a driver to take her to an appointment. Patient states she is going to keep her appointment where it is at for now. Patient states she is going to go day by day and will do what she can from her end.

## 2024-05-22 NOTE — Telephone Encounter (Signed)
 Please clarify if she is experiencing any joint pain or joint swelling? Can she upload a picture to help differentiate edema vs. Joint inflammation?

## 2024-05-22 NOTE — Telephone Encounter (Signed)
 Attempted to contact the patient and left a message to call the office back.

## 2024-05-22 NOTE — Telephone Encounter (Signed)
 Contacted the patient and inquired if she is experiencing any joint pain or joint swelling. Patient states she has swelling in her feet and ankles and joint pain pretty much everywhere else. Patient states she is also having a great deal of back pain too. Patient states the lasix  did not help her as all and there was no difference when taking it.   Inquired if the patient can upload a picture to help differentiate edema vs. Joint inflammation. Patient states she has taken some pictures and will send them in a message.

## 2024-05-22 NOTE — Telephone Encounter (Signed)
 Ok to schedule sooner visit for further evaluation if needed.  If the swelling is more consistent with edema I would recommend evaluation by PCP to see if she needs a change in diuretic.

## 2024-05-23 ENCOUNTER — Inpatient Hospital Stay: Attending: Oncology

## 2024-05-23 DIAGNOSIS — D509 Iron deficiency anemia, unspecified: Secondary | ICD-10-CM | POA: Diagnosis not present

## 2024-05-23 DIAGNOSIS — Z853 Personal history of malignant neoplasm of breast: Secondary | ICD-10-CM | POA: Diagnosis not present

## 2024-05-23 LAB — CBC WITH DIFFERENTIAL/PLATELET
Abs Immature Granulocytes: 0.02 K/uL (ref 0.00–0.07)
Basophils Absolute: 0.1 K/uL (ref 0.0–0.1)
Basophils Relative: 1 %
Eosinophils Absolute: 0.5 K/uL (ref 0.0–0.5)
Eosinophils Relative: 8 %
HCT: 42.5 % (ref 36.0–46.0)
Hemoglobin: 13.8 g/dL (ref 12.0–15.0)
Immature Granulocytes: 0 %
Lymphocytes Relative: 27 %
Lymphs Abs: 1.6 K/uL (ref 0.7–4.0)
MCH: 28.8 pg (ref 26.0–34.0)
MCHC: 32.5 g/dL (ref 30.0–36.0)
MCV: 88.5 fL (ref 80.0–100.0)
Monocytes Absolute: 0.8 K/uL (ref 0.1–1.0)
Monocytes Relative: 13 %
Neutro Abs: 2.9 K/uL (ref 1.7–7.7)
Neutrophils Relative %: 51 %
Platelets: 222 K/uL (ref 150–400)
RBC: 4.8 MIL/uL (ref 3.87–5.11)
RDW: 15.2 % (ref 11.5–15.5)
WBC: 5.8 K/uL (ref 4.0–10.5)
nRBC: 0 % (ref 0.0–0.2)

## 2024-05-23 LAB — IRON AND TIBC
Iron: 40 ug/dL (ref 28–170)
Saturation Ratios: 12 % (ref 10.4–31.8)
TIBC: 346 ug/dL (ref 250–450)
UIBC: 306 ug/dL

## 2024-05-23 LAB — FERRITIN: Ferritin: 43 ng/mL (ref 11–307)

## 2024-05-24 ENCOUNTER — Ambulatory Visit

## 2024-05-24 ENCOUNTER — Ambulatory Visit: Admitting: Oncology

## 2024-05-25 ENCOUNTER — Encounter: Payer: Self-pay | Admitting: Family Medicine

## 2024-05-26 ENCOUNTER — Inpatient Hospital Stay: Admitting: Oncology

## 2024-05-26 ENCOUNTER — Inpatient Hospital Stay

## 2024-06-20 ENCOUNTER — Encounter: Payer: Self-pay | Admitting: Rheumatology

## 2024-06-20 DIAGNOSIS — M0579 Rheumatoid arthritis with rheumatoid factor of multiple sites without organ or systems involvement: Secondary | ICD-10-CM

## 2024-06-20 MED ORDER — ENBREL SURECLICK 50 MG/ML ~~LOC~~ SOAJ
50.0000 mg | SUBCUTANEOUS | 0 refills | Status: DC
Start: 2024-06-20 — End: 2024-08-24

## 2024-06-20 NOTE — Telephone Encounter (Signed)
 Last Fill: 03/22/2024  Labs: 05/23/2024 CBC WNL, 05/05/2024 BMP, Hepatic Function Panel WNL  TB Gold: 10/18/2023 Neg    Next Visit: 08/23/2024  Last Visit: 03/22/2024  IK:Dzmnendpupcz rheumatoid arthritis of multiple sites   Current Dose per office note 03/22/2024: Enbrel  SureClick 50 mg sq injections every 7 days   Okay to refill Enbrel ?

## 2024-06-23 ENCOUNTER — Telehealth: Payer: Self-pay

## 2024-06-23 NOTE — Telephone Encounter (Signed)
 Ronal Ruth contacted the office from [employer] and states she was told we faxed a prescription in for the patient's Enbrel  but she did not see it in the fax que. Advised Ronal Ruth that the prescription was e-scribed. Ronal Ruth states that the prescription may still be processing and she will keep a lookout for it.

## 2024-06-28 ENCOUNTER — Other Ambulatory Visit: Payer: Self-pay | Admitting: Family Medicine

## 2024-07-28 ENCOUNTER — Other Ambulatory Visit: Payer: Self-pay | Admitting: Family Medicine

## 2024-07-28 NOTE — Telephone Encounter (Signed)
 Name of Medication: Ativan  Name of Pharmacy: [facility] [address] Last Fill or Written Date and Quantity: 12/15/23 #30 tabs/ 1 refill Last Office Visit and Type: leg problems 05/05/24 Next Office Visit and Type: none scheduled   Gabapentin  refilled on 01/25/24 #90 caps/ 3 refills

## 2024-08-09 NOTE — Progress Notes (Unsigned)
 Office Visit Note  Patient: Samantha Valentine             Date of Birth: [birth year]           MRN: [id number]             PCP: Randeen Laine LABOR, MD Referring: Tower, Laine LABOR, MD Visit Date: 08/23/2024 Occupation: Data Unavailable  Subjective:  Medication monitoring  History of Present Illness: Samantha Valentine is a 78 y.o. female with history of seropositive rheumatoid arthritis and osteoarthritis.  Patient remains on enbrel  50 mg sq injections once weekly.  She is tolerating Enbrel  without any side effects or injection site reactions.  She denies any gaps in therapy.  Patient continues to have sporadic flares affecting her hands especially the right hand.  Last week she had some increased inflammation in the right hand which has not subsided.  She has not been performing any repetitive or overuse activities.  She takes Celebrex  200 mg 1 capsule twice daily as needed for symptomatic relief.  She will occasionally take Tylenol  for pain relief if needed.  She is also taking gabapentin  as prescribed. She denies any new medical conditions.  She denies any recent or recurrent infections.     Activities of Daily Living:  Patient reports morning stiffness for 2 hours.   Patient Reports nocturnal pain.  Difficulty dressing/grooming: Reports Difficulty climbing stairs: Reports Difficulty getting out of chair: Reports Difficulty using hands for taps, buttons, cutlery, and/or writing: Reports  Review of Systems  Constitutional:  Positive for fatigue.  HENT:  Positive for mouth dryness. Negative for mouth sores.   Eyes:  Positive for dryness.  Cardiovascular:  Negative for chest pain and palpitations.  Gastrointestinal:  Positive for constipation. Negative for blood in stool and diarrhea.  Endocrine: Negative for increased urination.  Genitourinary:  Positive for involuntary urination.  Musculoskeletal:  Positive for joint pain, gait problem, joint pain, joint swelling, myalgias, muscle  weakness, morning stiffness, muscle tenderness and myalgias.  Skin:  Positive for hair loss and sensitivity to sunlight. Negative for color change and rash.  Allergic/Immunologic: Negative for susceptible to infections.  Neurological:  Negative for dizziness and headaches.  Hematological:  Negative for swollen glands.  Psychiatric/Behavioral:  Positive for depressed mood and sleep disturbance. The patient is nervous/anxious.     PMFS History:  Patient Active Problem List   Diagnosis Date Noted   Constipation 05/05/2024   Sacroiliac pain 01/25/2024   Iron  deficiency anemia 12/27/2023   Swelling of eyelid 06/29/2023   Anemia 03/29/2023   Fatigue 03/29/2023   Pedal edema 03/29/2023   Elevated blood pressure reading 03/29/2023   Shortness of breath 03/29/2023   Vitamin B12 deficiency 03/29/2023   Low serum iron  03/29/2023   Insomnia 12/15/2022   History of breast cancer 12/23/2021   Elevated glucose level 11/23/2018   Routine general medical examination at a health care facility 08/09/2018   DDD (degenerative disc disease), thoracolumbar 06/28/2018   DDD (degenerative disc disease), cervical 06/28/2018   Atherosclerosis of aorta 03/11/2018   Sleep disturbance 06/18/2017   Tachycardia 05/25/2017   Estrogen deficiency 03/22/2017   Malignant neoplasm of upper-outer quadrant of left breast in female, estrogen receptor negative (HCC) 02/04/2017   Tendinopathy of right shoulder 01/25/2017   High risk medication use 09/29/2016   DJD (degenerative joint disease), cervical 09/29/2016   History of right hip replacement 09/29/2016   Eczema 07/13/2014   Adverse effect of immunosuppressive drug 04/27/2012   ANXIETY DEPRESSION 10/28/2008  Spinal stenosis 12/29/2007   Hypothyroidism 12/28/2007   Vitamin D  deficiency 12/28/2007   HEARING LOSS 12/28/2007   Seropositive rheumatoid arthritis of multiple sites (HCC) 12/28/2007   DDD (degenerative disc disease), lumbar 12/28/2007   Fibromyalgia  12/28/2007   Osteoporosis 12/28/2007   MIGRAINES, HX OF 12/28/2007    Past Medical History:  Diagnosis Date   Anxiety    Arthritis    RA   Breast cancer (HCC) 01/29/2017   Breast mass 1 year    Cancer (HCC) 01/29/2017   left breast INVASIVE MAMMARY CARCINOMA, ER/PR positive   Cataract 2019   resolved with surgery   Collagen vascular disease    Rhematoid Arthritis   Complication of anesthesia    DDD (degenerative disc disease)    in neck   Depression    Dyspnea    Emphysema of lung (HCC)    Fibromyalgia    GERD (gastroesophageal reflux disease)    NO MEDS   History of hiatal hernia    History of kidney stones    MULTIPLE KIDNEY STONES BIL   HOH (hard of hearing)    AIDS   Hypothyroidism    Macular degeneration, bilateral    Migraine    Osteoporosis    Palpitations    Personal history of chemotherapy    prior to mastectomy   PONV (postoperative nausea and vomiting)    AFTER FIRST CATARACT   Rheumatoid arthritis (HCC)     Family History  Problem Relation Age of Onset   Hypertension Mother    Endometrial cancer Mother    Osteoarthritis Mother    Hearing loss Mother    Alcohol abuse Father    Diabetes Father    Stroke Father    Liver disease Sister    Breast cancer Maternal Aunt    Breast cancer Paternal Aunt 24   Rheum arthritis Maternal Grandmother    Arthritis Maternal Grandmother    Diabetes Paternal Grandmother    Parkinson's disease Paternal Grandfather    Past Surgical History:  Procedure Laterality Date   BREAST BIOPSY Left 01/29/2017   US  guided biopsy INVASIVE MAMMARY CARCINOMA   CATARACT EXTRACTION W/PHACO Left 04/28/2018   Procedure: CATARACT EXTRACTION PHACO AND INTRAOCULAR LENS PLACEMENT (IOC);  Surgeon: Mittie Gaskin, MD;  Location: [facility];  Service: Ophthalmology;  Laterality: Left;  Lot # [id number] H US    1:03 AP%   13.4 CDE    8.40   CATARACT EXTRACTION W/PHACO Right 06/09/2018   Procedure: CATARACT EXTRACTION PHACO AND INTRAOCULAR  LENS PLACEMENT (IOC);  Surgeon: Mittie Gaskin, MD;  Location: [facility];  Service: Ophthalmology;  Laterality: Right;  lot# [id number] h us  0:55 ap 16.3% cde 8.21   HIP ARTHROPLASTY     JOINT REPLACEMENT Right 2003   total hip replacement   LITHOTRIPSY  1997   kidney stone   MASTECTOMY Left 01/29/2017   MASTECTOMY W/ SENTINEL NODE BIOPSY Left 05/18/2017   Procedure: MASTECTOMY WITH SENTINEL LYMPH NODE BIOPSY;  Surgeon: Dessa Reyes ORN, MD;  Location: [facility];  Service: General;  Laterality: Left;   PORT-A-CATH REMOVAL  09/2017   PORTACATH PLACEMENT Right 02/15/2017   Procedure: INSERTION PORT-A-CATH;  Surgeon: Reyes ORN Dessa, MD;  Location: [facility];  Service: General;  Laterality: Right;   TONSILLECTOMY  1970   Social History   Tobacco Use   Smoking status: Former    Current packs/day: 0.00    Average packs/day: 1.5 packs/day for 25.0 years (37.5 ttl pk-yrs)    Types: Cigarettes  Start date: 10/19/1958    Quit date: 10/20/1983    Years since quitting: 40.8    Passive exposure: Past   Smokeless tobacco: Never  Vaping Use   Vaping status: Never Used  Substance Use Topics   Alcohol use: Not Currently   Drug use: Never   Social History   Social History Narrative   Not on file     Immunization History  Administered Date(s) Administered   Fluad Trivalent(High Dose 65+) 06/29/2023   INFLUENZA, HIGH DOSE SEASONAL PF 07/28/2017, 07/17/2018, 07/26/2019   Influenza,inj,Quad PF,6+ Mos 07/05/2013, 07/13/2014, 08/20/2015   Influenza-Unspecified 07/19/2016, 07/28/2017, 07/17/2018   PFIZER(Purple Top)SARS-COV-2 Vaccination 10/30/2019, 11/18/2019, 06/14/2020   Pneumococcal Conjugate-13 08/20/2015   Pneumococcal Polysaccharide-23 11/03/2006, 07/05/2013   Td 05/03/2002     Objective: Vital Signs: BP 131/75   Pulse 88   Temp 98.3 F (36.8 C)   Resp 14   Ht 5' 3.5 (1.613 m)   Wt 154 lb 11.2 oz (70.2 kg)   BMI 26.97 kg/m    Physical Exam Vitals and nursing note  reviewed.  Constitutional:      Appearance: She is well-developed.  HENT:     Head: Normocephalic and atraumatic.  Eyes:     Conjunctiva/sclera: Conjunctivae normal.  Cardiovascular:     Rate and Rhythm: Normal rate and regular rhythm.     Heart sounds: Normal heart sounds.  Pulmonary:     Effort: Pulmonary effort is normal.     Breath sounds: Normal breath sounds.  Abdominal:     General: Bowel sounds are normal.     Palpations: Abdomen is soft.  Musculoskeletal:     Cervical back: Normal range of motion.  Lymphadenopathy:     Cervical: No cervical adenopathy.  Skin:    General: Skin is warm and dry.     Capillary Refill: Capillary refill takes less than 2 seconds.  Neurological:     Mental Status: She is alert and oriented to person, place, and time.  Psychiatric:        Behavior: Behavior normal.      Musculoskeletal Exam: C- spine has limited range of motion without rotation.  Thoracic kyphosis noted.  Limited mobility of the lumbar spine.  Shoulder joints have good range of motion.  Elbow joints have good range of motion no tenderness along the joint line.  Wrist joints have good range of motion with no tenderness or synovitis.  Dupuytren contracture fourth digit bilaterally.  Synovial thickening of the right 2nd and 3rd MCP joints.  PIP and DIP thickening.  Hip joints have good range of motion with no groin pain.  Knee joints have good range of motion with no warmth or effusion.  CDAI Exam: CDAI Score: -- Patient Global: --; Provider Global: -- Swollen: --; Tender: -- Joint Exam 08/23/2024   No joint exam has been documented for this visit   There is currently no information documented on the homunculus. Go to the Rheumatology activity and complete the homunculus joint exam.  Investigation: No additional findings.  Imaging: No results found.  Recent Labs: Lab Results  Component Value Date   WBC 5.8 05/23/2024   HGB 13.8 05/23/2024   PLT 222 05/23/2024   NA  139 05/05/2024   K 4.2 05/05/2024   CL 106 05/05/2024   CO2 24 05/05/2024   GLUCOSE 97 05/05/2024   BUN 15 05/05/2024   CREATININE 0.69 05/05/2024   BILITOT 0.4 05/05/2024   ALKPHOS 75 09/09/2023   AST 13 05/05/2024  ALT 10 05/05/2024   PROT 6.3 05/05/2024   ALBUMIN 3.8 09/09/2023   CALCIUM 9.4 05/05/2024   GFRAA 101 11/06/2020   QFTBGOLD NEGATIVE 09/03/2017   QFTBGOLDPLUS NEGATIVE 10/18/2023    Speciality Comments: No specialty comments available.  Procedures:  No procedures performed Allergies: Adhesive [tape], Cymbalta  [duloxetine  hcl], Hydroxychloroquine sulfate, Ibandronate sodium, and Risedronate sodium   Assessment / Plan:     Visit Diagnoses: Seropositive rheumatoid arthritis of multiple sites (HCC) - +RF, erosive disease: Patient has intermittent flares primarily affecting both hands.  She has remained on Enbrel  50 mg sq injections once weekly.  She continues to tolerate Enbrel  without any side effects or injection site reactions.  She has not had any gaps in therapy.  She takes Celebrex  200 mg twice daily as needed for symptomatic relief.  She is not interested in switching to another biologic at this time.  Her symptoms have been manageable overall. No medication changes will be made at this time.  She was advised to notify us  if she develops signs or symptoms of a flare. She will follow-up in the office in 5 months or sooner if needed.  High risk medication use - Enbrel  SureClick 50 mg sq injections every 7 days. (d/c SSZ in the past due to constipation). CBC 05/23/24. BMP and hepatic function panel updated on 05/05/24. Orders for CBC and CMP released today.  TB gold negative on 10/18/23.  Lipid panel updated on 12/16/23.  No recent or recurrent infections.  Discussed the importance of holding enbrel  if she develops signs or symptoms of an infection and to resume once the infection has completely cleared.    - Plan: CBC with Differential/Platelet, Comprehensive metabolic  panel with GFR, QuantiFERON-TB Gold Plus, Fe+TIBC+Fer  Screening for tuberculosis -Future order for TB gold placed today.  Plan: QuantiFERON-TB Gold Plus  Fibromyalgia: She has intermittent myalgias and muscle tenderness due to fibromyalgia.  Patient takes gabapentin  as prescribed.  Primary osteoarthritis of both hands: PIP and DIP thickening consistent with osteoarthritis of both hands.  CMC joint prominence and thickening bilaterally.  Some tenderness of the right CMC joint noted today.  Discussed the importance of joint protection and muscle strengthening.  She takes Celebrex  200 mg twice daily as needed for pain relief.  History of right hip replacement: Doing well.   Pain in both feet: Good ROM of both ankle joints with no tenderness or joint swelling.   DDD (degenerative disc disease), cervical: C-spine has limited ROM with lateral rotation.   DDD (degenerative disc disease), thoracic: Thoracic kyphosis noted.  Degeneration of intervertebral disc of lumbar region without discogenic back pain or lower extremity pain: She continues to have chronic pain and stiffness affecting the lower back.  No symptoms of radiculopathy at this time.  Patient has continued physical therapy.  Age-related osteoporosis without current pathological fracture - DEXA updated May 06, 2017 T score -2.4.  Patient was treated with Forteo and Prolia  in the past.  She is taking vitamin D  1000 units daily.  History of vitamin D  deficiency: She is currently taking vitamin D  1000 units daily.  Other medical conditions are listed as follows:  History of anemia -Patient requested to have iron  panel checked today.  Plan: Fe+TIBC+Fer  Malignant neoplasm of upper-outer quadrant of left breast in female, estrogen receptor negative (HCC)  History of hearing loss  History of hypothyroidism  History of depression  History of anxiety  History of migraine  Vitamin B12  deficiency  Dyslipidemia  Orders: Orders Placed This Encounter  Procedures   CBC with Differential/Platelet   Comprehensive metabolic panel with GFR   QuantiFERON-TB Gold Plus   Fe+TIBC+Fer   No orders of the defined types were placed in this encounter.    Follow-Up Instructions: Return in about 5 months (around 01/21/2025) for Rheumatoid arthritis, Osteoarthritis.   Waddell CHRISTELLA Craze, PA-C  Note - This record has been created using Dragon software.  Chart creation errors have been sought, but may not always  have been located. Such creation errors do not reflect on  the standard of medical care.

## 2024-08-23 ENCOUNTER — Encounter: Payer: Self-pay | Admitting: Physician Assistant

## 2024-08-23 ENCOUNTER — Ambulatory Visit: Attending: Physician Assistant | Admitting: Physician Assistant

## 2024-08-23 VITALS — BP 131/75 | HR 88 | Temp 98.3°F | Resp 14 | Ht 63.5 in | Wt 154.7 lb

## 2024-08-23 DIAGNOSIS — M51369 Other intervertebral disc degeneration, lumbar region without mention of lumbar back pain or lower extremity pain: Secondary | ICD-10-CM | POA: Diagnosis not present

## 2024-08-23 DIAGNOSIS — Z862 Personal history of diseases of the blood and blood-forming organs and certain disorders involving the immune mechanism: Secondary | ICD-10-CM | POA: Diagnosis not present

## 2024-08-23 DIAGNOSIS — E785 Hyperlipidemia, unspecified: Secondary | ICD-10-CM

## 2024-08-23 DIAGNOSIS — M5134 Other intervertebral disc degeneration, thoracic region: Secondary | ICD-10-CM | POA: Diagnosis not present

## 2024-08-23 DIAGNOSIS — Z79899 Other long term (current) drug therapy: Secondary | ICD-10-CM

## 2024-08-23 DIAGNOSIS — M19042 Primary osteoarthritis, left hand: Secondary | ICD-10-CM

## 2024-08-23 DIAGNOSIS — M0579 Rheumatoid arthritis with rheumatoid factor of multiple sites without organ or systems involvement: Secondary | ICD-10-CM | POA: Diagnosis not present

## 2024-08-23 DIAGNOSIS — C50412 Malignant neoplasm of upper-outer quadrant of left female breast: Secondary | ICD-10-CM | POA: Diagnosis not present

## 2024-08-23 DIAGNOSIS — Z96641 Presence of right artificial hip joint: Secondary | ICD-10-CM

## 2024-08-23 DIAGNOSIS — M79671 Pain in right foot: Secondary | ICD-10-CM | POA: Diagnosis not present

## 2024-08-23 DIAGNOSIS — Z8669 Personal history of other diseases of the nervous system and sense organs: Secondary | ICD-10-CM

## 2024-08-23 DIAGNOSIS — M797 Fibromyalgia: Secondary | ICD-10-CM

## 2024-08-23 DIAGNOSIS — E538 Deficiency of other specified B group vitamins: Secondary | ICD-10-CM

## 2024-08-23 DIAGNOSIS — M19041 Primary osteoarthritis, right hand: Secondary | ICD-10-CM

## 2024-08-23 DIAGNOSIS — M5030 Other cervical disc degeneration, unspecified cervical region: Secondary | ICD-10-CM

## 2024-08-23 DIAGNOSIS — Z8639 Personal history of other endocrine, nutritional and metabolic disease: Secondary | ICD-10-CM

## 2024-08-23 DIAGNOSIS — M79672 Pain in left foot: Secondary | ICD-10-CM

## 2024-08-23 DIAGNOSIS — M810 Age-related osteoporosis without current pathological fracture: Secondary | ICD-10-CM

## 2024-08-23 DIAGNOSIS — Z111 Encounter for screening for respiratory tuberculosis: Secondary | ICD-10-CM

## 2024-08-23 DIAGNOSIS — Z8659 Personal history of other mental and behavioral disorders: Secondary | ICD-10-CM

## 2024-08-23 DIAGNOSIS — Z171 Estrogen receptor negative status [ER-]: Secondary | ICD-10-CM

## 2024-08-24 ENCOUNTER — Ambulatory Visit: Payer: Self-pay | Admitting: Physician Assistant

## 2024-08-24 ENCOUNTER — Other Ambulatory Visit: Payer: Self-pay

## 2024-08-24 DIAGNOSIS — M0579 Rheumatoid arthritis with rheumatoid factor of multiple sites without organ or systems involvement: Secondary | ICD-10-CM

## 2024-08-24 LAB — CBC WITH DIFFERENTIAL/PLATELET
Absolute Lymphocytes: 1993 {cells}/uL (ref 850–3900)
Absolute Monocytes: 621 {cells}/uL (ref 200–950)
Basophils Absolute: 37 {cells}/uL (ref 0–200)
Basophils Relative: 0.5 %
Eosinophils Absolute: 234 {cells}/uL (ref 15–500)
Eosinophils Relative: 3.2 %
HCT: 43.8 % (ref 35.0–45.0)
Hemoglobin: 14 g/dL (ref 11.7–15.5)
MCH: 28.1 pg (ref 27.0–33.0)
MCHC: 32 g/dL (ref 32.0–36.0)
MCV: 87.8 fL (ref 80.0–100.0)
MPV: 11.9 fL (ref 7.5–12.5)
Monocytes Relative: 8.5 %
Neutro Abs: 4417 {cells}/uL (ref 1500–7800)
Neutrophils Relative %: 60.5 %
Platelets: 292 Thousand/uL (ref 140–400)
RBC: 4.99 Million/uL (ref 3.80–5.10)
RDW: 12.2 % (ref 11.0–15.0)
Total Lymphocyte: 27.3 %
WBC: 7.3 Thousand/uL (ref 3.8–10.8)

## 2024-08-24 LAB — COMPREHENSIVE METABOLIC PANEL WITH GFR
AG Ratio: 1.2 (calc) (ref 1.0–2.5)
ALT: 12 U/L (ref 6–29)
AST: 15 U/L (ref 10–35)
Albumin: 3.7 g/dL (ref 3.6–5.1)
Alkaline phosphatase (APISO): 78 U/L (ref 37–153)
BUN: 17 mg/dL (ref 7–25)
CO2: 26 mmol/L (ref 20–32)
Calcium: 9.2 mg/dL (ref 8.6–10.4)
Chloride: 105 mmol/L (ref 98–110)
Creat: 0.78 mg/dL (ref 0.60–1.00)
Globulin: 3 g/dL (ref 1.9–3.7)
Glucose, Bld: 101 mg/dL — ABNORMAL HIGH (ref 65–99)
Potassium: 4.5 mmol/L (ref 3.5–5.3)
Sodium: 139 mmol/L (ref 135–146)
Total Bilirubin: 0.4 mg/dL (ref 0.2–1.2)
Total Protein: 6.7 g/dL (ref 6.1–8.1)
eGFR: 79 mL/min/1.73m2 (ref >=60)

## 2024-08-24 LAB — IRON,TIBC AND FERRITIN PANEL
%SAT: 16 % (ref 16–45)
Ferritin: 21 ng/mL (ref 16–288)
Iron: 60 ug/dL (ref 45–160)
TIBC: 366 ug/dL (ref 250–450)

## 2024-08-24 MED ORDER — ENBREL SURECLICK 50 MG/ML ~~LOC~~ SOAJ: 50.0000 mg | SUBCUTANEOUS | 0 refills | Status: AC

## 2024-08-24 NOTE — Progress Notes (Unsigned)
 Please review and sign Enbrel  refill requested by patient. Celebrex  has been added to patient's medication list.

## 2024-08-24 NOTE — Progress Notes (Signed)
 CBC and CMP WNL Iron  panel WNL

## 2024-09-11 ENCOUNTER — Telehealth: Payer: Self-pay

## 2024-09-11 NOTE — Telephone Encounter (Signed)
 Patient due for Enbrel  PAP renewal  Patient forms completed.  Provider forms placed in Waddell Craze, PA-C's folder for signature  Submitted a Prior Authorization request to [facility] ADVANTAGE/RX ADVANCE for ENBREL  via CoverMyMeds. Will update once we receive a response.  Key: [id number]   Per automated response: Prior Authorization duplicate/approved. Previous PA expires on 09/21/2024. Will submit this approval but [employer] may ask for updated PA  Sherry Pennant, PharmD, MPH, BCPS, CPP Clinical Pharmacist [facility])

## 2024-09-12 NOTE — Telephone Encounter (Signed)
 Received signed provider form from Waddell Craze, PA-C  Submit PAP renewal after 09/21/2024 once PA for Enbrel  is renewed  Sherry Pennant, PharmD, MPH, BCPS, CPP Clinical Pharmacist [facility])

## 2024-09-27 ENCOUNTER — Encounter: Payer: Self-pay | Admitting: Family Medicine

## 2024-09-27 ENCOUNTER — Ambulatory Visit: Payer: Self-pay

## 2024-09-27 NOTE — Telephone Encounter (Signed)
 FYI Only or Action Required?: FYI only for provider: appointment scheduled on 10/02/24.  Patient was last seen in primary care on 05/05/2024 by Randeen Laine LABOR, MD.  Called Nurse Triage reporting Knee Pain.  Symptoms began 2 weeks ago.  Interventions attempted: Prescription medications: Celebrex .  Symptoms are: unchanged.  Triage Disposition: See PCP When Office is Open (Within 3 Days)  Patient/caregiver understands and will follow disposition?: Yes Reason for Disposition  [1] MODERATE pain (e.g., interferes with normal activities, limping) AND [2] present > 3 days  Answer Assessment - Initial Assessment Questions Patient reports having rheumatoid arthritis. Patient reports taking Celebrex  and occasionally Tylenol .  1. LOCATION and RADIATION: Where is the pain located?      Both knees, right knee is worse  2. QUALITY: What does the pain feel like?  (e.g., sharp, dull, aching, burning)     Crepitus and dull/ achy, sharp pain with movement  3. SEVERITY: How bad is the pain? What does it keep you from doing?   (Scale 1-10; or mild, moderate, severe)     Varies, but has been a 10/10  4. ONSET: When did the pain start? Does it come and go, or is it there all the time?     2 weeks ago  5. RECURRENT: Have you had this pain before? If Yes, ask: When, and what happened then?     Reports having knee aspirated years ago  6. OTHER SYMPTOMS: Do you have any other symptoms? (e.g., calf pain, chest pain, difficulty breathing, fever)     Swelling, right calf feels a little sore, but has been trying to do heel raises to keep from feet swelling  Protocols used: Knee Pain-A-AH  Copied from CRM #[id number]. Topic: Clinical - Red Word Triage >> Sep 27, 2024 10:00 AM [place] MATSU wrote: Red Word that prompted transfer to Nurse Triage: swelling in knee pain both but right is worse over the last two weeks.

## 2024-09-27 NOTE — Telephone Encounter (Signed)
 Aware, will watch for correspondence Thanks to Dr Watt for seeing her

## 2024-09-28 NOTE — Telephone Encounter (Signed)
 Received notification from [employer]/RX ADVANCE regarding a prior authorization for ENBREL . Authorization has been APPROVED from 09/28/2024 to 09/28/2025. Approval letter sent to scan center.  Authorization # [id number]  Sherry Pennant, PharmD, MPH, BCPS, CPP Clinical Pharmacist

## 2024-09-28 NOTE — Telephone Encounter (Signed)
 Submitted a Prior Authorization renewal request to [facility] ADVANTAGE/RX ADVANCE for ENBREL  via CoverMyMeds. Will update once we receive a response.  Key: [id number]

## 2024-10-01 NOTE — Progress Notes (Unsigned)
° ° ° °  Samantha Reitan T. Dyshawn Cangelosi, MD, CAQ Sports Medicine [facility] [address] [place], [address]  Phone: [phone]  FAX: [phone]  Samantha Valentine - 78 y.o. female  MRN [id number]  Date of Birth: [birth year]  Date: 10/02/2024  PCP: Randeen Laine LABOR, MD  Referral: Randeen Laine LABOR, MD  No chief complaint on file.  Subjective:   Samantha Valentine is a 78 y.o. very pleasant female patient with There is no height or weight on file to calculate BMI. who presents with the following:  Discussed the use of AI scribe software for clinical note transcription with the patient, who gave verbal consent to proceed.  Patient presents with ongoing R knee pain.  She did send me a MyChart message about this, but this is not anything that I have ever evaluated for the patient. History of Present Illness     Review of Systems is noted in the HPI, as appropriate  Objective:   There were no vitals taken for this visit.  GEN: No acute distress; alert,appropriate. PULM: Breathing comfortably in no respiratory distress PSYCH: Normally interactive.   Laboratory and Imaging Data:  Assessment and Plan:   No diagnosis found. Assessment & Plan   Medication Management during today's office visit: No orders of the defined types were placed in this encounter.  There are no discontinued medications.  Orders placed today for conditions managed today: No orders of the defined types were placed in this encounter.   Disposition: No follow-ups on file.  [employer] One speech-to-text software was used for transcription in this dictation.  Possible transcriptional errors can occur using Animal nutritionist.   Signed,  Jacques DASEN. [name], MD   Outpatient Encounter Medications as of 10/02/2024  Medication Sig   aspirin EC 81 MG tablet Take 81 mg by mouth daily.   celecoxib  (CELEBREX ) 200 MG capsule Take 200 mg by mouth 2 (two) times daily as needed.    cholecalciferol (VITAMIN D ) 1000 units tablet Take 1,000 Units by mouth daily.   cyanocobalamin  (VITAMIN B12) 1000 MCG tablet Take 1,000 mcg by mouth daily.   etanercept  (ENBREL  SURECLICK) 50 MG/ML injection Inject 50 mg into the skin once a week.   furosemide  (LASIX ) 20 MG tablet Take 1 tablet (20 mg total) by mouth daily. (Patient not taking: Reported on 08/23/2024)   gabapentin  (NEURONTIN ) 100 MG capsule TAKE 1 CAPSULE BY MOUTH THREE TIMES DAILY (Patient taking differently: 100 mg 3 (three) times daily as needed. TAKE 1 CAPSULE BY MOUTH THREE TIMES DAILY)   gabapentin  (NEURONTIN ) 300 MG capsule TAKE 1 CAPSULE BY MOUTH THREE TIMES DAILY (Patient taking differently: Take 300 mg by mouth as needed.)   levothyroxine  (SYNTHROID ) 125 MCG tablet TAKE 1 TABLET BY MOUTH ONCE DAILY BEFORE BREAKFAST   LORazepam  (ATIVAN ) 1 MG tablet TAKE 1 TABLET BY MOUTH AT BEDTIME AS NEEDED FOR ANXIETY   tiZANidine  (ZANAFLEX ) 4 MG tablet TAKE 1 TABLET BY MOUTH EVERY 8 HOURS AS NEEDED FOR MUSCLE SPASM (CAUTION  OF  SEDATION)   No facility-administered encounter medications on file as of 10/02/2024.

## 2024-10-02 ENCOUNTER — Ambulatory Visit: Admitting: Family Medicine

## 2024-10-02 ENCOUNTER — Encounter: Payer: Self-pay | Admitting: Family Medicine

## 2024-10-02 ENCOUNTER — Ambulatory Visit
Admission: RE | Admit: 2024-10-02 | Discharge: 2024-10-02 | Disposition: A | Source: Ambulatory Visit | Attending: Family Medicine | Admitting: Family Medicine

## 2024-10-02 VITALS — BP 140/90 | HR 61 | Temp 98.2°F | Ht 63.5 in | Wt 158.0 lb

## 2024-10-02 DIAGNOSIS — M0579 Rheumatoid arthritis with rheumatoid factor of multiple sites without organ or systems involvement: Secondary | ICD-10-CM

## 2024-10-02 DIAGNOSIS — M25561 Pain in right knee: Secondary | ICD-10-CM | POA: Diagnosis not present

## 2024-10-02 DIAGNOSIS — G8929 Other chronic pain: Secondary | ICD-10-CM

## 2024-10-02 MED ORDER — TRIAMCINOLONE ACETONIDE 40 MG/ML IJ SUSP
40.0000 mg | Freq: Once | INTRAMUSCULAR | Status: AC
  Administered 2024-10-02: 12:00:00 40 mg via INTRA_ARTICULAR

## 2024-10-02 NOTE — Patient Instructions (Signed)
 Voltaren 1% gel, over the counter ?You can apply up to 4 times a day ? ?This can be applied to any joint: knee, wrist, fingers, elbows, shoulders, feet and ankles. ?Can apply to any tendon: tennis elbow, achilles, tendon, rotator cuff or any other tendon. ? ?Minimal is absorbed in the bloodstream: ok with oral anti-inflammatory or a blood thinner. ? ?Cost is about 9 dollars  ?

## 2024-10-03 NOTE — Telephone Encounter (Signed)
 Submitted Patient Assistance renewal Application to [employer] for ENBREL  with patient portion, provider portion, insurance card copy, prior authorization approval, and current medication list. Will update patient when we receive a response.  Phone #: [phone] Fax #: [phone]  Sherry Pennant, PharmD, MPH, BCPS, CPP Clinical Pharmacist

## 2024-10-13 ENCOUNTER — Ambulatory Visit: Payer: Self-pay | Admitting: Family Medicine

## 2024-10-17 NOTE — Telephone Encounter (Signed)
 Per automated system at [employer], all 2026 applications will be processed in 2026 (determination will be made no sooner than 2026)

## 2024-11-08 NOTE — Telephone Encounter (Signed)
 Per [email or web address], application is missing information that patient was potentially contacted about. There is option to upload documents but unclear what exactly is required. MyChart message sent to patient  Sherry Pennant, PharmD, MPH, BCPS, CPP Clinical Pharmacist

## 2025-02-13 ENCOUNTER — Ambulatory Visit: Admitting: Rheumatology
